# Patient Record
Sex: Male | Born: 1935 | Race: White | Hispanic: No | Marital: Married | Smoking: Former smoker
Health system: Southern US, Community
[De-identification: ages and names within clinical notes are randomized; demographics above are authoritative.]

## PROBLEM LIST (undated history)

## (undated) DIAGNOSIS — R141 Gas pain: Secondary | ICD-10-CM

## (undated) DIAGNOSIS — G4730 Sleep apnea, unspecified: Secondary | ICD-10-CM

## (undated) DIAGNOSIS — K921 Melena: Secondary | ICD-10-CM

## (undated) DIAGNOSIS — I10 Essential (primary) hypertension: Secondary | ICD-10-CM

## (undated) DIAGNOSIS — I2510 Atherosclerotic heart disease of native coronary artery without angina pectoris: Secondary | ICD-10-CM

## (undated) DIAGNOSIS — H9319 Tinnitus, unspecified ear: Secondary | ICD-10-CM

## (undated) DIAGNOSIS — R142 Eructation: Secondary | ICD-10-CM

## (undated) DIAGNOSIS — N182 Chronic kidney disease, stage 2 (mild): Secondary | ICD-10-CM

## (undated) DIAGNOSIS — D551 Anemia due to other disorders of glutathione metabolism: Secondary | ICD-10-CM

## (undated) DIAGNOSIS — T7840XA Allergy, unspecified, initial encounter: Secondary | ICD-10-CM

## (undated) DIAGNOSIS — R1310 Dysphagia, unspecified: Secondary | ICD-10-CM

## (undated) DIAGNOSIS — A159 Respiratory tuberculosis unspecified: Secondary | ICD-10-CM

## (undated) DIAGNOSIS — L02419 Cutaneous abscess of limb, unspecified: Secondary | ICD-10-CM

## (undated) DIAGNOSIS — D529 Folate deficiency anemia, unspecified: Secondary | ICD-10-CM

## (undated) DIAGNOSIS — K648 Other hemorrhoids: Secondary | ICD-10-CM

## (undated) DIAGNOSIS — K625 Hemorrhage of anus and rectum: Secondary | ICD-10-CM

## (undated) DIAGNOSIS — L259 Unspecified contact dermatitis, unspecified cause: Secondary | ICD-10-CM

## (undated) DIAGNOSIS — R634 Abnormal weight loss: Secondary | ICD-10-CM

## (undated) DIAGNOSIS — E871 Hypo-osmolality and hyponatremia: Secondary | ICD-10-CM

## (undated) DIAGNOSIS — L03119 Cellulitis of unspecified part of limb: Secondary | ICD-10-CM

## (undated) DIAGNOSIS — Z125 Encounter for screening for malignant neoplasm of prostate: Secondary | ICD-10-CM

## (undated) DIAGNOSIS — G2581 Restless legs syndrome: Secondary | ICD-10-CM

## (undated) DIAGNOSIS — N183 Chronic kidney disease, stage 3 (moderate): Secondary | ICD-10-CM

## (undated) DIAGNOSIS — F0390 Unspecified dementia without behavioral disturbance: Secondary | ICD-10-CM

## (undated) DIAGNOSIS — D696 Thrombocytopenia, unspecified: Secondary | ICD-10-CM

## (undated) DIAGNOSIS — IMO0001 Reserved for inherently not codable concepts without codable children: Secondary | ICD-10-CM

## (undated) DIAGNOSIS — R6889 Other general symptoms and signs: Secondary | ICD-10-CM

## (undated) DIAGNOSIS — B354 Tinea corporis: Secondary | ICD-10-CM

## (undated) DIAGNOSIS — E785 Hyperlipidemia, unspecified: Secondary | ICD-10-CM

## (undated) DIAGNOSIS — R609 Edema, unspecified: Secondary | ICD-10-CM

## (undated) DIAGNOSIS — E669 Obesity, unspecified: Secondary | ICD-10-CM

## (undated) DIAGNOSIS — N289 Disorder of kidney and ureter, unspecified: Secondary | ICD-10-CM

## (undated) DIAGNOSIS — K5790 Diverticulosis of intestine, part unspecified, without perforation or abscess without bleeding: Secondary | ICD-10-CM

## (undated) DIAGNOSIS — E039 Hypothyroidism, unspecified: Secondary | ICD-10-CM

## (undated) DIAGNOSIS — I509 Heart failure, unspecified: Secondary | ICD-10-CM

## (undated) DIAGNOSIS — E559 Vitamin D deficiency, unspecified: Secondary | ICD-10-CM

## (undated) DIAGNOSIS — R143 Flatulence: Secondary | ICD-10-CM

## (undated) DIAGNOSIS — R252 Cramp and spasm: Secondary | ICD-10-CM

## (undated) DIAGNOSIS — K265 Chronic or unspecified duodenal ulcer with perforation: Secondary | ICD-10-CM

## (undated) DIAGNOSIS — J189 Pneumonia, unspecified organism: Secondary | ICD-10-CM

## (undated) DIAGNOSIS — D649 Anemia, unspecified: Secondary | ICD-10-CM

## (undated) DIAGNOSIS — R413 Other amnesia: Secondary | ICD-10-CM

## (undated) DIAGNOSIS — R197 Diarrhea, unspecified: Secondary | ICD-10-CM

## (undated) DIAGNOSIS — Z9181 History of falling: Secondary | ICD-10-CM

## (undated) DIAGNOSIS — R0602 Shortness of breath: Secondary | ICD-10-CM

## (undated) DIAGNOSIS — Z5189 Encounter for other specified aftercare: Secondary | ICD-10-CM

## (undated) HISTORY — DX: Abnormal weight loss: R63.4

## (undated) HISTORY — DX: Chronic kidney disease, stage 3 (moderate): N18.3

## (undated) HISTORY — DX: Edema, unspecified: R60.9

## (undated) HISTORY — PX: EYE SURGERY: SHX253

## (undated) HISTORY — DX: Respiratory tuberculosis unspecified: A15.9

## (undated) HISTORY — DX: Disorder of kidney and ureter, unspecified: N28.9

## (undated) HISTORY — DX: Hemorrhage of anus and rectum: K62.5

## (undated) HISTORY — DX: Other amnesia: R41.3

## (undated) HISTORY — DX: Other general symptoms and signs: R68.89

## (undated) HISTORY — DX: Tinea corporis: B35.4

## (undated) HISTORY — PX: CORONARY ARTERY BYPASS GRAFT: SHX141

## (undated) HISTORY — DX: Eructation: R14.2

## (undated) HISTORY — DX: Pneumonia, unspecified organism: J18.9

## (undated) HISTORY — DX: Encounter for screening for malignant neoplasm of prostate: Z12.5

## (undated) HISTORY — DX: Anemia due to other disorders of glutathione metabolism: D55.1

## (undated) HISTORY — DX: History of falling: Z91.81

## (undated) HISTORY — DX: Shortness of breath: R06.02

## (undated) HISTORY — DX: Cramp and spasm: R25.2

## (undated) HISTORY — DX: Diverticulosis of intestine, part unspecified, without perforation or abscess without bleeding: K57.90

## (undated) HISTORY — DX: Chronic or unspecified duodenal ulcer with perforation: K26.5

## (undated) HISTORY — DX: Anemia, unspecified: D64.9

## (undated) HISTORY — DX: Melena: K92.1

## (undated) HISTORY — DX: Hypothyroidism, unspecified: E03.9

## (undated) HISTORY — DX: Obesity, unspecified: E66.9

## (undated) HISTORY — DX: Gas pain: R14.1

## (undated) HISTORY — DX: Encounter for other specified aftercare: Z51.89

## (undated) HISTORY — DX: Unspecified contact dermatitis, unspecified cause: L25.9

## (undated) HISTORY — PX: JEJUNOSTOMY FEEDING TUBE: SUR737

## (undated) HISTORY — DX: Flatulence: R14.3

## (undated) HISTORY — DX: Hypo-osmolality and hyponatremia: E87.1

## (undated) HISTORY — DX: Folate deficiency anemia, unspecified: D52.9

## (undated) HISTORY — DX: Thrombocytopenia, unspecified: D69.6

## (undated) HISTORY — DX: Tinnitus, unspecified ear: H93.19

## (undated) HISTORY — DX: Dysphagia, unspecified: R13.10

## (undated) HISTORY — DX: Unspecified dementia without behavioral disturbance: F03.90

## (undated) HISTORY — DX: Chronic kidney disease, stage 2 (mild): N18.2

## (undated) HISTORY — DX: Vitamin D deficiency, unspecified: E55.9

## (undated) HISTORY — DX: Reserved for inherently not codable concepts without codable children: IMO0001

## (undated) HISTORY — DX: Diarrhea, unspecified: R19.7

## (undated) HISTORY — DX: Allergy, unspecified, initial encounter: T78.40XA

## (undated) HISTORY — DX: Atherosclerotic heart disease of native coronary artery without angina pectoris: I25.10

## (undated) HISTORY — DX: Other hemorrhoids: K64.8

---
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
## 2000-02-09 ENCOUNTER — Ambulatory Visit (HOSPITAL_COMMUNITY): Admission: RE | Admit: 2000-02-09 | Discharge: 2000-02-09 | Payer: Self-pay | Admitting: Cardiovascular Disease

## 2000-02-15 ENCOUNTER — Encounter: Payer: Self-pay | Admitting: Thoracic Surgery (Cardiothoracic Vascular Surgery)

## 2000-02-19 ENCOUNTER — Encounter: Payer: Self-pay | Admitting: Thoracic Surgery (Cardiothoracic Vascular Surgery)

## 2000-02-19 ENCOUNTER — Inpatient Hospital Stay (HOSPITAL_COMMUNITY)
Admission: RE | Admit: 2000-02-19 | Discharge: 2000-02-23 | Payer: Self-pay | Admitting: Thoracic Surgery (Cardiothoracic Vascular Surgery)

## 2000-02-20 ENCOUNTER — Encounter: Payer: Self-pay | Admitting: Thoracic Surgery (Cardiothoracic Vascular Surgery)

## 2000-02-21 ENCOUNTER — Encounter: Payer: Self-pay | Admitting: Thoracic Surgery (Cardiothoracic Vascular Surgery)

## 2000-02-21 ENCOUNTER — Encounter: Payer: Self-pay | Admitting: Cardiothoracic Surgery

## 2003-08-13 DIAGNOSIS — R413 Other amnesia: Secondary | ICD-10-CM

## 2003-08-13 HISTORY — DX: Other amnesia: R41.3

## 2005-05-30 ENCOUNTER — Encounter: Admission: RE | Admit: 2005-05-30 | Discharge: 2005-06-07 | Payer: Self-pay | Admitting: Surgery

## 2007-06-23 DIAGNOSIS — H9319 Tinnitus, unspecified ear: Secondary | ICD-10-CM

## 2007-06-23 DIAGNOSIS — R0602 Shortness of breath: Secondary | ICD-10-CM

## 2007-06-23 DIAGNOSIS — R141 Gas pain: Secondary | ICD-10-CM

## 2007-06-23 DIAGNOSIS — E669 Obesity, unspecified: Secondary | ICD-10-CM

## 2007-06-23 DIAGNOSIS — R6889 Other general symptoms and signs: Secondary | ICD-10-CM

## 2007-06-23 DIAGNOSIS — B354 Tinea corporis: Secondary | ICD-10-CM

## 2007-06-23 DIAGNOSIS — R609 Edema, unspecified: Secondary | ICD-10-CM

## 2007-06-23 HISTORY — DX: Tinnitus, unspecified ear: H93.19

## 2007-06-23 HISTORY — DX: Edema, unspecified: R60.9

## 2007-06-23 HISTORY — DX: Obesity, unspecified: E66.9

## 2007-06-23 HISTORY — DX: Flatulence: R14.1

## 2007-06-23 HISTORY — DX: Tinea corporis: B35.4

## 2007-06-23 HISTORY — DX: Other general symptoms and signs: R68.89

## 2007-06-23 HISTORY — DX: Shortness of breath: R06.02

## 2008-11-11 ENCOUNTER — Encounter: Payer: Self-pay | Admitting: Gastroenterology

## 2008-11-18 ENCOUNTER — Encounter: Payer: Self-pay | Admitting: Gastroenterology

## 2008-11-18 DIAGNOSIS — K921 Melena: Secondary | ICD-10-CM

## 2008-11-18 HISTORY — DX: Melena: K92.1

## 2008-12-13 ENCOUNTER — Ambulatory Visit: Payer: Self-pay | Admitting: Gastroenterology

## 2008-12-13 DIAGNOSIS — D649 Anemia, unspecified: Secondary | ICD-10-CM

## 2008-12-13 DIAGNOSIS — K921 Melena: Secondary | ICD-10-CM

## 2008-12-24 ENCOUNTER — Ambulatory Visit: Payer: Self-pay | Admitting: Gastroenterology

## 2009-02-24 DIAGNOSIS — E559 Vitamin D deficiency, unspecified: Secondary | ICD-10-CM

## 2009-02-24 HISTORY — DX: Vitamin D deficiency, unspecified: E55.9

## 2009-03-24 IMAGING — CR CXR
1 series · 2 of 2 positions shown · non-contrast
Comparison: NONE

CLINICAL DATA: Shortness of breath. Cough. History of cardiac 
bypass  surgery. 

CHEST TWO VIEW (PA AND LATERAL)

[Series 1: view not recorded · 0.17mm/px · 2 of 2 slices shown]
[im 1/2]
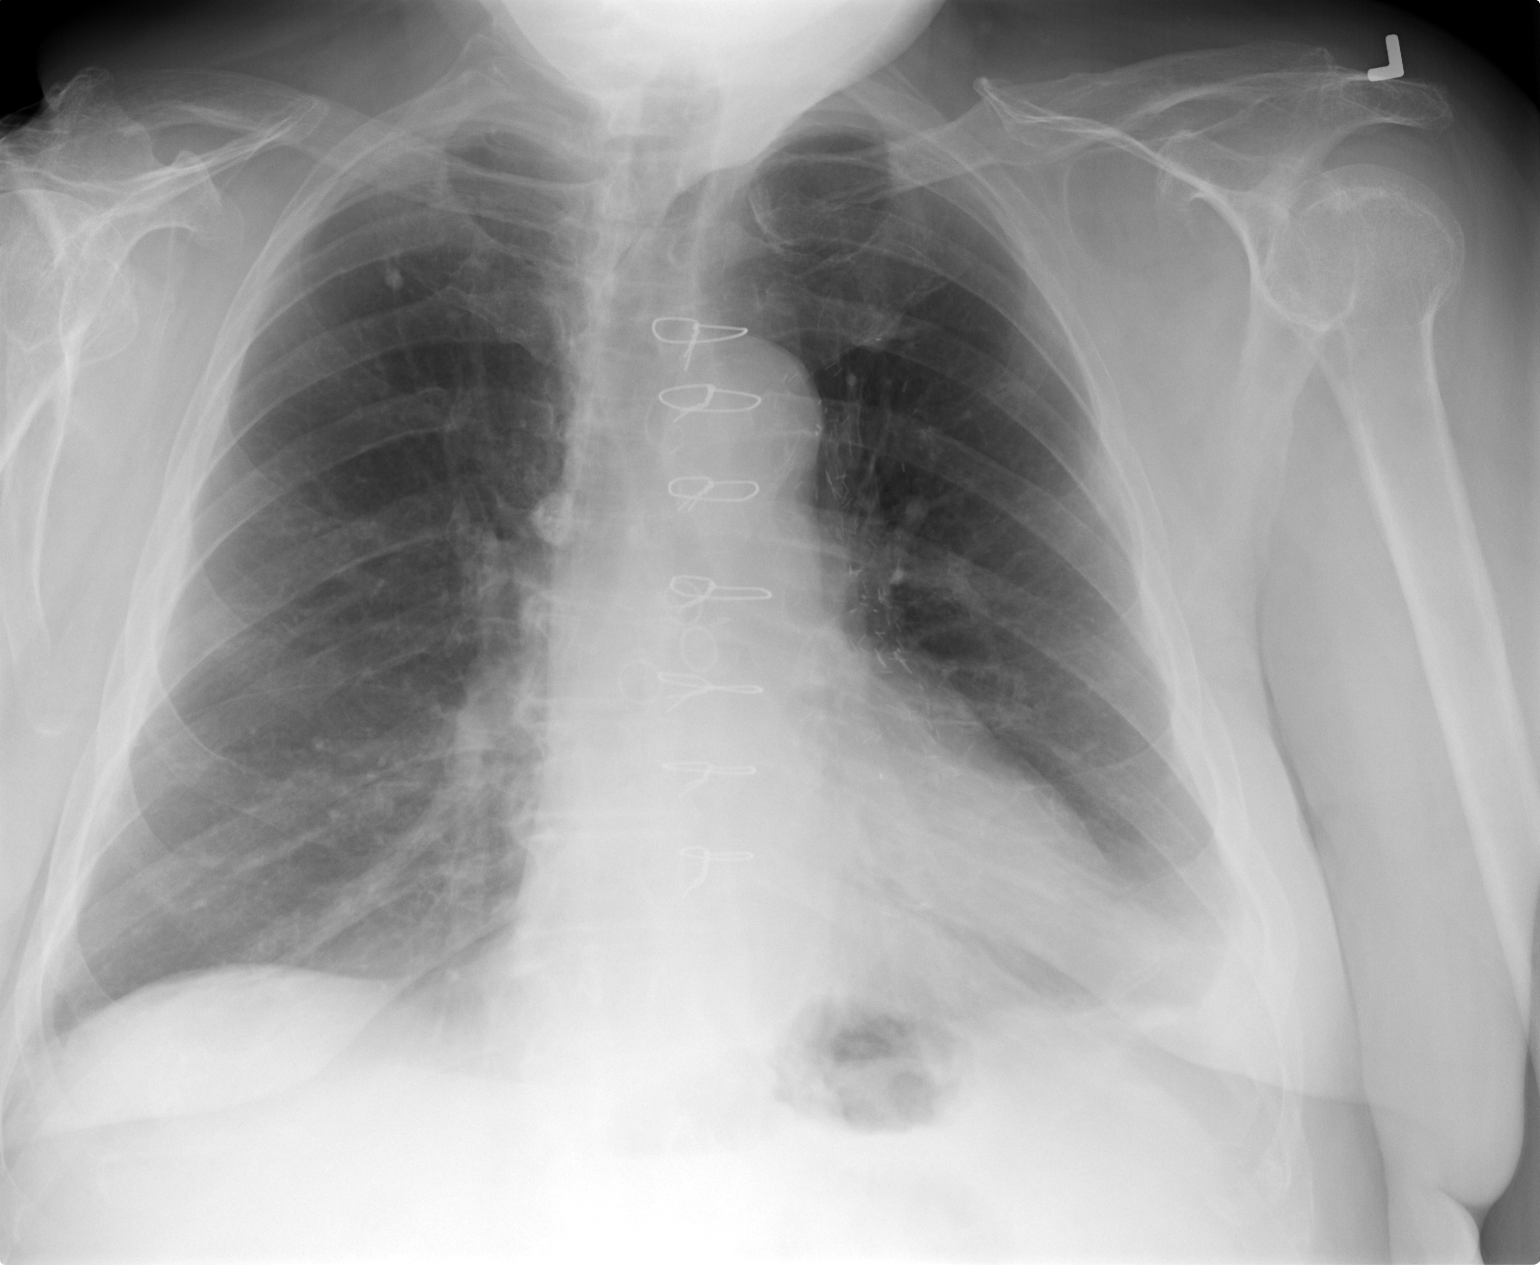
[im 2/2]
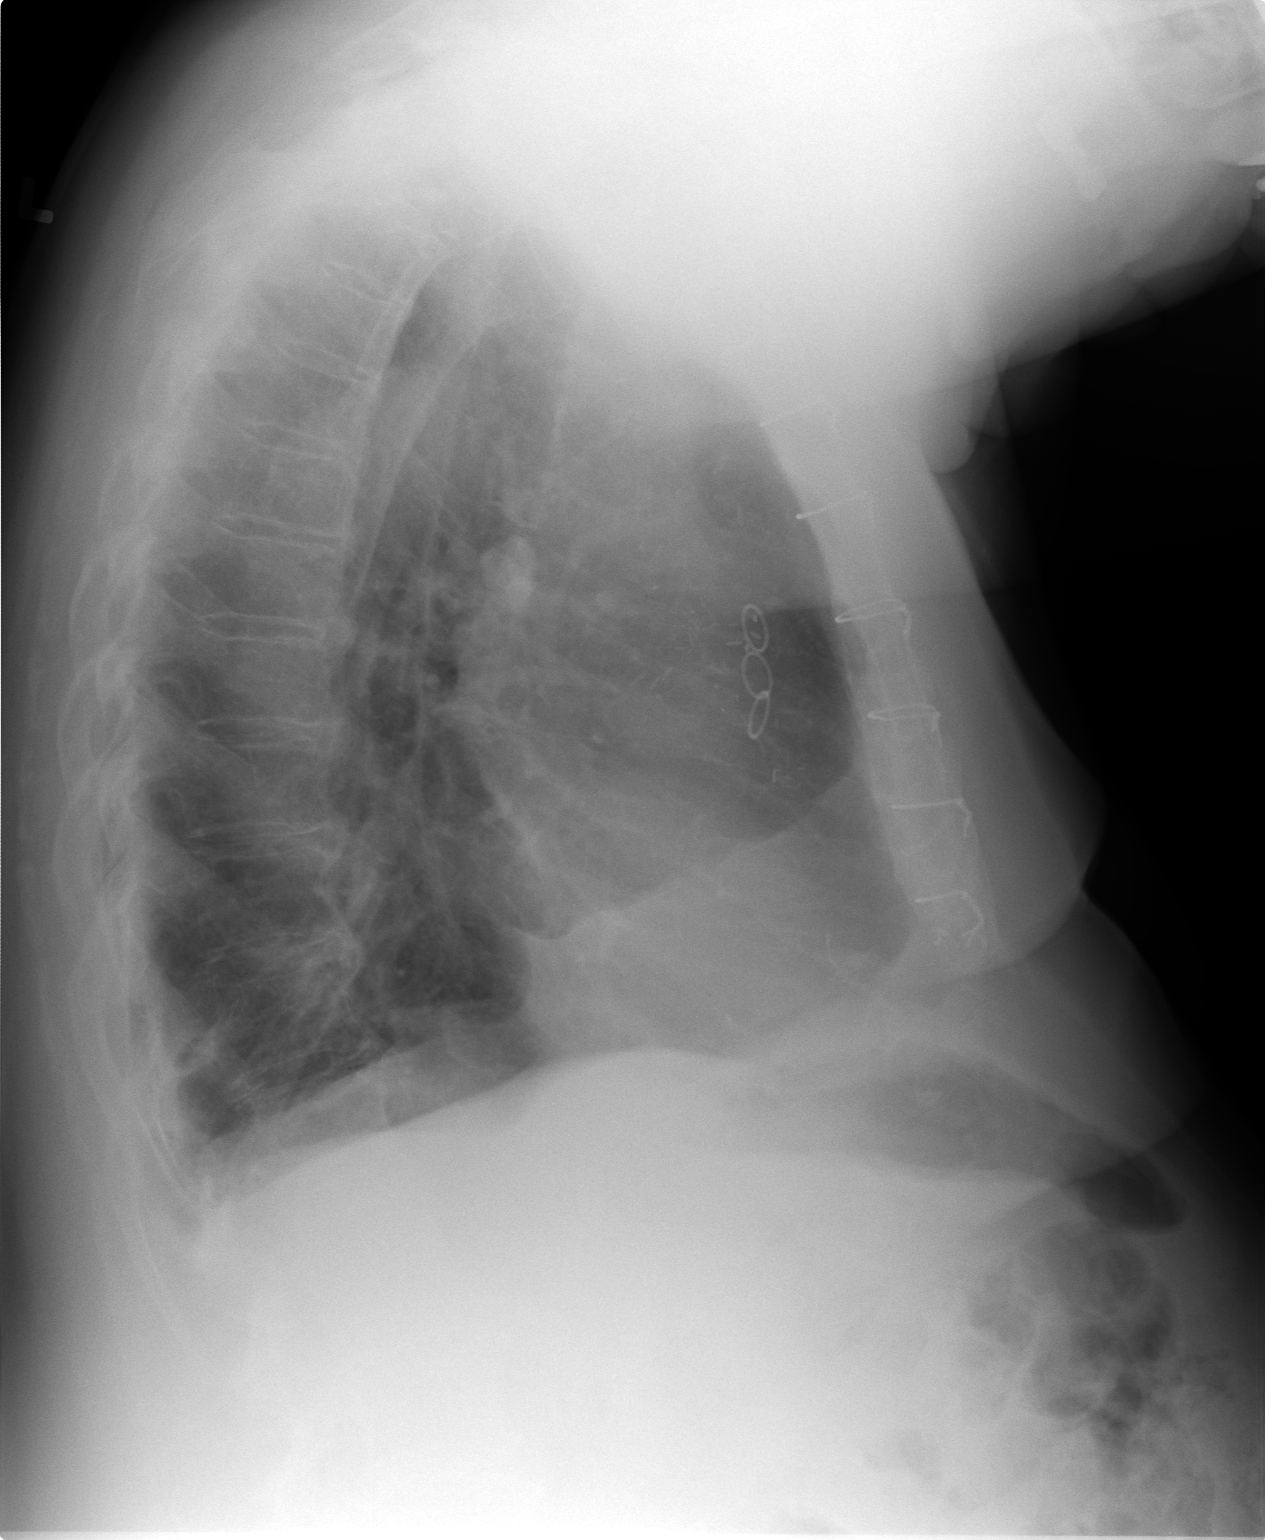

[2 of 2 positions shown; findings below may reference images not displayed]

FINDINGS: Sternal sutures are present. The heart is upper normal 
for size. There is blunting of the left costophrenic angle. 
Without prior films, I cannot determine if this acute or chronic. 
Calcified right azygous lymph node and small calcified granuloma 
present in the right upper lobe is consistent with prior 
granulomatous disease. There are no infiltrates. Osteopenia and 
mild thoracic spondylosis is noted. There is mild hyperinflation 
present.
IMPRESSION: Mild hyperinflation. Blunting of the left 
costophrenic angle suggestive of either pleural effusion or 
pleural thickening. I recommend obtaining prior films for 
comparison. If priors cannot be obtained, I recommend a follow-up 
study in 2-4 weeks. Evidence for old granulomatous disease. Larry Jose 
Rastio Forevar, M.D. electronically reviewed on 12/25/2007 Dict Date: 
12/25/2007  Tran Date: 12/25/2007 CAV  [REDACTED]

## 2009-05-05 DIAGNOSIS — Z9181 History of falling: Secondary | ICD-10-CM

## 2009-05-05 HISTORY — DX: History of falling: Z91.81

## 2010-08-10 DIAGNOSIS — R252 Cramp and spasm: Secondary | ICD-10-CM

## 2010-08-10 HISTORY — DX: Cramp and spasm: R25.2

## 2010-11-07 LAB — GLUCOSE, CAPILLARY
Glucose-Capillary: 106 mg/dL — ABNORMAL HIGH (ref 70–99)
Glucose-Capillary: 108 mg/dL — ABNORMAL HIGH (ref 70–99)

## 2010-12-15 NOTE — Op Note (Signed)
[place]. [facility]  Patient:    [name] [name], [name] [name]                     MRN: [id number] Proc. Date: 02/19/00 Adm. Date:  [id number] Attending:  Charlett Lango                           Operative Report  PREOPERATIVE DIAGNOSIS:  Severe three-vessel coronary artery disease with impaired left ventricular function.  POSTOPERATIVE DIAGNOSIS:  Severe three-vessel coronary artery disease with impaired left ventricular function.  PROCEDURE:  Median sternotomy, extracorporeal circulation, coronary artery bypass grafting times five, (left internal mammary artery to left anterior descending, saphenous vein graft to first diagonal, sequential saphenous vein grafts to OM 2 and 3, saphenous vein graft to posterior descending).  SURGEON:  Salvatore Decent. Dorris Fetch, M.D.  ASSISTANT:  Nada Boozer.  ANESTHESIA:  General.  FINDINGS:  OM-1 too small to graft, remaining coronaries good targets, conduits of good quality, left ventricular hypertrophy and dilatation.  CLINICAL NOTE:  [name] is a 77 year old gentleman who presented with stable angina.  He had a positive Cardiolite and underwent cardiac catheterization which revealed severe three-vessel disease with a co-dominant circulation and impaired left ventricular function with an ejection fraction of 45%.  The patient was referred for coronary artery bypass grafting.  The indications, risks, benefits and alternatives were discussed in detail with the patient.  He had an opportunity to ask questions.  He accepted the risks and agreed to proceed.  PROCEDURE IN DETAIL:  [name] was brought to the preop holding area on February 19, 2000.  Lines were placed to monitor arterial, central venous and pulmonary arterial pressure.  EKG leads were placed for continuous telemetry.  The patient was taken to the operating room, anesthetized and intubated.  A Foley catheter was placed.  Intravenous antibiotics were  administered.  The chest, abdomen and legs were prepped and draped in the usual fashion.  A median sternotomy was performed.  Simultaneously incision was made in the medial aspect of the right leg, and the greater saphenous vein was harvested from the ankle to the mid thigh.  The left internal mammary artery was harvested in a standard fashion.  Large branches were doubly clipped and divided.  Small branches were clipped on the mammary side and divided with cautery on the chest wall side.  The patient was fully heparinized prior to dividing the distal end of the mammary artery.  There was good flow to the cut end of the vessel.  The mammary was placed in a papaverine soaked sponge after insuring that all side branches had been clipped.  It was placed into the left pleural space.  The pericardium was opened.  The ascending aorta was inspected and palpated. The aorta was normal size with no palpable atherosclerotic disease.  The aorta was cannulated via concentric 2-0 Ethibond non-pledgeted purse-string sutures.  A dual stage venous cannula was placed via purse-string suture in the right atrial appendage.  Cardiopulmonary bypass was instituted and the patient was cooled to 32 degree Celsius.  The coronary arteries were inspected and anastomotic sites were chosen.  The conduits were inspect and cut to length.  A foam pad was placed in the pericardium to protect the left phrenic nerve.  A temperature probe was placed in the myocardial septum and a cardioplegic cannula was placed in the ascending aorta.  The aorta was cross clamped.  The left  ventricle was intubated via the aortic root.  Cardiac arrest was achieved with a combination of cold antegrade blood cardioplegia and topical iced saline.  800 cc of cardioplegia was administered.  Myocardial septal temperature was 12 degrees Celsius.  The following distal anastomoses then were performed:  First, a reverse saphenous vein graft was  placed end-to-side to the posterior descending branch of the right coronary artery.  He had a co-dominant circulation, but the main posterior descending branch came off the right.  The posterolateral circulation came off the left.  The posterior descending from the right was a 1.5 mm good quality target.  The anastomosis was performed end-to-side with a running 7-0 Prolene suture.  The vein graft was of good quality.  At the completion of the anastomosis, cardioplegia was administered down the vein graft.  There was good flow through the graft and there was good hemostasis.  Next, a reverse saphenous vein graft was placed end-to-side to the first diagonal branch of the LAD.  This was a heavily diseased vessel proximally but good quality target at the site of the anastomosis.  The anastomosis was performed with a running 7-0 Prolene suture, was probed proximally and distally at the completion of the anastomosis to insure patency.  There was good flow through the graft.  Additional cardioplegia was administered down the vein graft.  There was good hemostasis.  Next, a reverse saphenous vein graft was placed sequentially to the second and third obtuse marginal branches of the left circumflex coronary artery.  OM-1 had a tight ostial stenosis but was too small to graft.  OM-2 and 3 were good quality targets.  OM-2 had a 99% stenosis at its origin.  A side-to-side anastomosis was performed of this vessel using a running 7-0 Prolene suture off a side branch of the vein graft.  There was good flow through this anastomosis.  An end-to-side anastomosis then was performed to obtuse marginal number three, which was 1.5 mm good quality vessel.  It supplied the distal posterolateral circulation.  The anastomosis was completed.  The vein graft had excellent flow.  Cardioplegia was administered.  Additional cardioplegia was also administered down the aortic root.  Next, the left internal mammary  artery was brought through a window in the pericardium.  The distal end was spatulated and was anastomosed end-to-side to the distal left anterior descending.  The left anterior descending was a 1.5  mm good quality target.  The mammary was also of good quality.  The anastomosis was difficult because of back-bleeding from the mammary artery which the aortic root vent did not lessen.  The anastomosis was performed with a running 8-0 Prolene suture.  At the completion of the anastomosis, the clamp was removed from the mammary artery.  Immediate septal rewarming was noted. Hemostasis was good.  The mammary pedicle was tacked to the epicardial surface of the heart with interrupted 6-0 Prolene sutures.  Lidocaine was administered and the aortic cross clamp was removed.  Total cross clamp time was 58 minutes.  The vein grafts were cut to length.  A partial occlusion clamp was placed on the ascending aorta.  The cardioplegic cannula was removed.  The proximal vein graft anastomoses were performed with 4.0 mm punch aortotomies with running 6-0 Prolene sutures.  At the completion of the final proximal anastomosis, the patient was placed in Trendelenburg position.  Air was allowed to vent as the partial clamp was removed prior to tying the final suture.  All proximal and distal  anastomoses were inspected for hemostasis.  The patient was rewarmed.  Epicardial pacing wires were placed on the right ventricle and right atrium.  The patient was weaned from cardiopulmonary bypass with the cor temperature reached 37 degrees Celsius.  The patient was in sinus rhythm at the time of separation from bypass.  A single defibrillation with 20 joules had been required after removing the cross clamp.  The patient also was on inotropic support at the time of separation from bypass.  The lungs were inflated.  The heart was gradually filled with blood and the pump flow was reduced.  The patient weaned without  difficulty on the first attempt.  The initial cardiac index was greater than 2 liters per minute.  Total bypass time was 116 minutes.  A test dose of Protamine was administered and was well tolerated.  The atrial and aortic cannulae were removed.  The remainder of Protamine was administered without incident.  An additional dose of antibiotics was given.  The chest was irrigated with one liter of warm normal saline containing 1 gram of Vancomycin.  Hemostasis was achieved.  A left pleural and two mediastinal chest tubes were placed through separate subcostal incisions.  The mediastinal fat was reapproximated over the heart to prevent adhesions to the sternum with interrupted 3-0 silk sutures.  The sternum was closed with interrupted heavy gauge stainless steel wires.  The pectoralis fascia was closed with a running #1 Vicryl suture.  Subcutaneous tissue was closed with a running 2-0 Vicryl suture, both the chest and the leg.  The skin was closed with a 3-0 Vicryl subcuticular suture.  All sponge, needle and instrument counts were correct at the end of the procedure.  There were no intraoperative complications.  The patient was taken from the operating room to the surgical intensive care unit, intubated in stable condition.DD:  02/19/00 TD:  02/20/00 Job: [id number]

## 2010-12-15 NOTE — Cardiovascular Report (Signed)
[place]. [facility]  Patient:    [name] [name], [name] [name]                     MRN: [id number] Proc. Date: 02/09/00 Adm. Date:  [id number] Attending:  Koren Bound CC:         Austin Miles. Asencion Islam, M.D.             CVTS             Cardiac Catheterization Laboratory                        Cardiac Catheterization  INDICATIONS:  [name] is a 77 year old gentleman with a recent diagonal of diabetes mellitus.  He also has a history of obesity.  He was referred for heart catheterization after having an early positive treadmill test.  He had originally presented to Dr. Asencion Islam with severe dyspnea with minimal exertion.  PROCEDURES:  Left heart catheterization with coronary angiography.  DESCRIPTION OF PROCEDURE:  The right femoral artery was easily cannulated using a modified Seldinger technique.  HEMODYNAMICS:  The left ventricular pressure was 100/15 with an aortic pressure of 99/52.  ANGIOGRAPHY:  The left main coronary artery is large and is otherwise normal.  The left anterior descending artery is subtotally occluded in the proximal segment.  There is very sluggish antegrade flow.  There are several diagonal branches which are visible.  The left circumflex artery is a very large vessel and is codominant.  The proximal aspect of this vessel is normal.  There is a 50-60% mid stenosis. The distal circumflex artery has mild to moderate irregularities but no critical stenoses.  The first obtuse marginal artery is a moderate sized vessel with a 90% stenosis proximally.  The second obtuse marginal artery is a fairly large vessel with a 95% proximal stenosis.  The third obtuse marginal is a fairly large vessel with a 40-50% stenosis proximally.  The posterolateral segment arteries have only minor luminal irregularities.  The right coronary artery is a moderate sized vessel and is codominant.  There is a 95% stenosis in the proximal aspect of this vessel.   There is very sluggish TIMI grade II TIMI grade flow.  The posterior descending artery has minor luminal irregularities.  LEFT VENTRICULOGRAM:  The left ventriculogram was performed in a 30 RAO position.  There is severe hypokinesis of the anterior and anterior apical wall.  The lateral wall and the inferior wall contract fairly normally and in fact the left ventricular systolic function is within normal limits with an EF of 61%.  There is no significant mitral regurgitation on the normal sinus rhythm beats.  The aortic root appears to be normal size.  COMPLICATIONS:  None.  CONCLUSIONS: 1. Severe three-vessel coronary artery disease. 2. Well-preserved left ventricular systolic function.  We will refer him to CVTS for consideration for coronary artery bypass grafting. DD:  02/09/00 TD:  02/09/00 Job: [id number]

## 2010-12-15 NOTE — Discharge Summary (Signed)
[place]. [facility]  Patient:    [name] [name], [name] [name]                     MRN: [id number] Adm. Date:  [id number] Disc. Date: [id number] Attending:  Charlett Lango Dictator:   Lissa Merlin, P.A.-C. CC:         CVTS Office             Alvia Grove., M.D.             Austin Miles. Asencion Islam, M.D.                           Discharge Summary  DATE OF BIRTH:  April 27, 1920  SURGEON:  Charlett Lango, M.D.  CARDIOLOGIST:  Elyn Aquas., M.D.  PRIMARY CARE PHYSICIAN:  Dr. Asencion Islam  ADMISSION DIAGNOSES: 1. Class III angina. 2. Recent positive stress test. 3. Recent cardiac catheterization showed three-vessel coronary disease with    ejection fraction of 50%.  PAST MEDICAL HISTORY: 1. No previous history of CAD. 2. Hypertension. 3. Recently diagnosed adult onset diabetes, no treatment yet. 4. Remote history of TB. 5. Cataract surgery. 6. Remote osteomyelitis. 7. Hypercholesterolemia.  DISCHARGE DIAGNOSES:  Status post CABG x 5 on February 19, 2000, with the following grafts:  LIMA to the LAD, sequential saphenous vein graft from OM-2 to OM-3, saphenous vein graft to PD, saphenous vein graft to first diagonal.  BRIEF HISTORY:  Mr. Corrales is a pleasant 77 year old white male who originally complained of a two-month history of tightness in his upper chest with exertion.  He was normally very active walking at least a mile a day but he noticed that he began to experience discomfort after one-half mile.  He also had shortness of breath associated with that.  He had no nausea, vomiting or diaphoresis.  He went to his primary physician, Dr. Asencion Islam and was subsequently referred to Dr. Elease Hashimoto.  He then had a positive stress test and subsequently underwent cardiac catheterization which showed severe three-vessel coronary disease and EF of 50%.  CVTS consultation was ordered, and Mr. Hosea saw Dr. Dorris Fetch at the CVTS office on February 14, 2000.  After  seeing Mr. Mogan and reviewing the available data, Dr. Dorris Fetch concluded CABG was the best treatment alternative.  Risks, benefits, details, and alternatives of surgery were discussed with Mr. Knittle and it was agreed to proceed.  Elective CABG was scheduled for Monday, February 19, 2000.  HOSPITAL COURSE:  Mr. Sunde was admitted for the elected procedure on February 19, 2000.  He underwent the procedure without obvious complications and was transferred to SICU in stable condition.  Postop, he made quick progress and was transferred to unit 2000 on postop day #1.  His postop course has been uneventful and he has been making quick progress on unit 2000.   He is currently stable and his vital signs are stable, was afebrile, his saturation is good.  He is almost down to his preoperative weight.  He is in sinus rhythm.  He is working with cardiac rehabilitation, doing quite well.  He is taking p.o.s well.  His bowel and bladder are well.  It is anticipated he will be discharged home pending satisfactory morning rounds on Friday, February 23, 2000.  MEDICATIONS: 1. Lopressor 50 mg one-half tablet p.o. q.12h. 2. Enteric-coated aspirin 325 mg one p.o. q.d. 3. Darvocet-N 100 one or two p.o. q.4-6h. p.r.n. for pain.  ALLERGIES:  No known drug allergies.  SPECIAL INSTRUCTIONS:  Mr. Saadeh is instructed to avoid driving, no strenuous activity, no lifting more than 10 pounds.  He is to walk daily.  He can shower.  He is to use his breathing exerciser daily.  He is to maintain low cholesterol, low sweets and sugar diet.  He is to keep his incisions clean and dry and to use soap and water only, and to call the office if he notices any redness, swelling, or drainage or fever.  He is told to get a chest x-ray when he sees Dr. Elease Hashimoto and to bring it with him when he sees Dr. Dorris Fetch.  FOLLOWUP: 1. He will see Dr. Elease Hashimoto in around two weeks. 2. He will see Dr. Dorris Fetch, Wednesday, August 22 at  11:45 a.m. 3. He has diabetes education arranged by the diabetes coordinator and will    go the appropriate followup.  He has been provided a Glucometer to check    his sugars at home. DD:  02/22/00 TD:  02/25/00 Job: [id number]

## 2011-03-22 DIAGNOSIS — N183 Chronic kidney disease, stage 3 unspecified: Secondary | ICD-10-CM

## 2011-03-22 DIAGNOSIS — N182 Chronic kidney disease, stage 2 (mild): Secondary | ICD-10-CM

## 2011-03-22 HISTORY — DX: Chronic kidney disease, stage 3 unspecified: N18.30

## 2011-03-22 HISTORY — DX: Chronic kidney disease, stage 2 (mild): N18.2

## 2011-06-28 DIAGNOSIS — R1310 Dysphagia, unspecified: Secondary | ICD-10-CM

## 2011-06-28 HISTORY — DX: Dysphagia, unspecified: R13.10

## 2011-09-11 ENCOUNTER — Encounter: Payer: Self-pay | Admitting: Gastroenterology

## 2011-09-11 DIAGNOSIS — R634 Abnormal weight loss: Secondary | ICD-10-CM

## 2011-09-11 HISTORY — DX: Abnormal weight loss: R63.4

## 2011-09-12 ENCOUNTER — Emergency Department (HOSPITAL_COMMUNITY): Payer: Medicare Other

## 2011-09-12 ENCOUNTER — Inpatient Hospital Stay (HOSPITAL_COMMUNITY): Payer: Medicare Other

## 2011-09-12 ENCOUNTER — Emergency Department (HOSPITAL_COMMUNITY): Payer: Medicare Other | Admitting: Anesthesiology

## 2011-09-12 ENCOUNTER — Encounter (HOSPITAL_COMMUNITY): Admission: EM | Disposition: A | Discharge status: Skilled Nursing Facility | Payer: Self-pay | Source: Home / Self Care

## 2011-09-12 ENCOUNTER — Inpatient Hospital Stay (HOSPITAL_COMMUNITY)
Admission: EM | Admit: 2011-09-12 | Discharge: 2011-09-24 | DRG: 326 | Disposition: A | Discharge status: Skilled Nursing Facility | Payer: Medicare Other | Attending: General Surgery | Admitting: General Surgery

## 2011-09-12 ENCOUNTER — Other Ambulatory Visit: Payer: Self-pay

## 2011-09-12 ENCOUNTER — Encounter (HOSPITAL_COMMUNITY): Payer: Self-pay | Admitting: Anesthesiology

## 2011-09-12 ENCOUNTER — Encounter (HOSPITAL_COMMUNITY): Payer: Self-pay | Admitting: *Deleted

## 2011-09-12 DIAGNOSIS — N189 Chronic kidney disease, unspecified: Secondary | ICD-10-CM

## 2011-09-12 DIAGNOSIS — K668 Other specified disorders of peritoneum: Secondary | ICD-10-CM | POA: Diagnosis present

## 2011-09-12 DIAGNOSIS — K265 Chronic or unspecified duodenal ulcer with perforation: Principal | ICD-10-CM | POA: Diagnosis present

## 2011-09-12 DIAGNOSIS — R112 Nausea with vomiting, unspecified: Secondary | ICD-10-CM | POA: Diagnosis present

## 2011-09-12 DIAGNOSIS — I129 Hypertensive chronic kidney disease with stage 1 through stage 4 chronic kidney disease, or unspecified chronic kidney disease: Secondary | ICD-10-CM | POA: Diagnosis present

## 2011-09-12 DIAGNOSIS — Z951 Presence of aortocoronary bypass graft: Secondary | ICD-10-CM

## 2011-09-12 DIAGNOSIS — I5033 Acute on chronic diastolic (congestive) heart failure: Secondary | ICD-10-CM | POA: Diagnosis not present

## 2011-09-12 DIAGNOSIS — I509 Heart failure, unspecified: Secondary | ICD-10-CM | POA: Diagnosis present

## 2011-09-12 DIAGNOSIS — E1122 Type 2 diabetes mellitus with diabetic chronic kidney disease: Secondary | ICD-10-CM

## 2011-09-12 DIAGNOSIS — I2510 Atherosclerotic heart disease of native coronary artery without angina pectoris: Secondary | ICD-10-CM | POA: Diagnosis present

## 2011-09-12 DIAGNOSIS — K251 Acute gastric ulcer with perforation: Secondary | ICD-10-CM

## 2011-09-12 DIAGNOSIS — N183 Chronic kidney disease, stage 3 unspecified: Secondary | ICD-10-CM | POA: Diagnosis present

## 2011-09-12 DIAGNOSIS — K269 Duodenal ulcer, unspecified as acute or chronic, without hemorrhage or perforation: Secondary | ICD-10-CM

## 2011-09-12 DIAGNOSIS — E785 Hyperlipidemia, unspecified: Secondary | ICD-10-CM | POA: Diagnosis present

## 2011-09-12 DIAGNOSIS — D696 Thrombocytopenia, unspecified: Secondary | ICD-10-CM | POA: Diagnosis not present

## 2011-09-12 DIAGNOSIS — R5381 Other malaise: Secondary | ICD-10-CM | POA: Diagnosis present

## 2011-09-12 DIAGNOSIS — E039 Hypothyroidism, unspecified: Secondary | ICD-10-CM | POA: Diagnosis present

## 2011-09-12 DIAGNOSIS — J9690 Respiratory failure, unspecified, unspecified whether with hypoxia or hypercapnia: Secondary | ICD-10-CM

## 2011-09-12 DIAGNOSIS — R578 Other shock: Secondary | ICD-10-CM | POA: Diagnosis present

## 2011-09-12 DIAGNOSIS — E46 Unspecified protein-calorie malnutrition: Secondary | ICD-10-CM | POA: Diagnosis present

## 2011-09-12 DIAGNOSIS — G4730 Sleep apnea, unspecified: Secondary | ICD-10-CM | POA: Diagnosis present

## 2011-09-12 DIAGNOSIS — D62 Acute posthemorrhagic anemia: Secondary | ICD-10-CM | POA: Diagnosis not present

## 2011-09-12 DIAGNOSIS — R579 Shock, unspecified: Secondary | ICD-10-CM

## 2011-09-12 DIAGNOSIS — J9600 Acute respiratory failure, unspecified whether with hypoxia or hypercapnia: Secondary | ICD-10-CM | POA: Diagnosis not present

## 2011-09-12 HISTORY — DX: Hyperlipidemia, unspecified: E78.5

## 2011-09-12 HISTORY — DX: Essential (primary) hypertension: I10

## 2011-09-12 HISTORY — DX: Obesity, unspecified: E66.9

## 2011-09-12 HISTORY — PX: GASTROSTOMY: SHX5249

## 2011-09-12 HISTORY — DX: Heart failure, unspecified: I50.9

## 2011-09-12 HISTORY — DX: Cellulitis of unspecified part of limb: L03.119

## 2011-09-12 HISTORY — PX: LAPAROTOMY: SHX154

## 2011-09-12 HISTORY — DX: Restless legs syndrome: G25.81

## 2011-09-12 HISTORY — DX: Cellulitis of unspecified part of limb: L02.419

## 2011-09-12 HISTORY — DX: Atherosclerotic heart disease of native coronary artery without angina pectoris: I25.10

## 2011-09-12 HISTORY — DX: Sleep apnea, unspecified: G47.30

## 2011-09-12 LAB — URINE MICROSCOPIC-ADD ON

## 2011-09-12 LAB — COMPREHENSIVE METABOLIC PANEL
ALT: 16 U/L (ref 0–53)
AST: 20 U/L (ref 0–37)
AST: 23 U/L (ref 0–37)
Albumin: 3.4 g/dL — ABNORMAL LOW (ref 3.5–5.2)
Alkaline Phosphatase: 54 U/L (ref 39–117)
BUN: 48 mg/dL — ABNORMAL HIGH (ref 6–23)
Calcium: 9.4 mg/dL (ref 8.4–10.5)
Chloride: 92 mEq/L — ABNORMAL LOW (ref 96–112)
Creatinine, Ser: 2.57 mg/dL — ABNORMAL HIGH (ref 0.50–1.35)
GFR calc Af Amer: 24 mL/min — ABNORMAL LOW (ref >90)
Glucose, Bld: 64 mg/dL — ABNORMAL LOW (ref 70–99)
Potassium: 5.2 mEq/L — ABNORMAL HIGH (ref 3.5–5.1)
Sodium: 133 mEq/L — ABNORMAL LOW (ref 135–145)
Total Bilirubin: 0.3 mg/dL (ref 0.3–1.2)
Total Protein: 5.4 g/dL — ABNORMAL LOW (ref 6.0–8.3)

## 2011-09-12 LAB — DIFFERENTIAL
Basophils Absolute: 0 10*3/uL (ref 0.0–0.1)
Basophils Relative: 0 % (ref 0–1)
Eosinophils Absolute: 0 10*3/uL (ref 0.0–0.7)
Eosinophils Absolute: 0 10*3/uL (ref 0.0–0.7)
Eosinophils Relative: 0 % (ref 0–5)
Lymphocytes Relative: 11 % — ABNORMAL LOW (ref 12–46)
Lymphs Abs: 0.6 10*3/uL — ABNORMAL LOW (ref 0.7–4.0)
Monocytes Absolute: 1 10*3/uL (ref 0.1–1.0)
Monocytes Relative: 7 % (ref 3–12)
Neutro Abs: 12 10*3/uL — ABNORMAL HIGH (ref 1.7–7.7)
Neutrophils Relative %: 86 % — ABNORMAL HIGH (ref 43–77)

## 2011-09-12 LAB — URINALYSIS, ROUTINE W REFLEX MICROSCOPIC
Glucose, UA: 100 mg/dL — AB
Ketones, ur: NEGATIVE mg/dL
Leukocytes, UA: NEGATIVE
Nitrite: NEGATIVE
Protein, ur: 30 mg/dL — AB
pH: 5.5 (ref 5.0–8.0)

## 2011-09-12 LAB — CBC
HCT: 31.5 % — ABNORMAL LOW (ref 39.0–52.0)
HCT: 35.6 % — ABNORMAL LOW (ref 39.0–52.0)
Hemoglobin: 10.8 g/dL — ABNORMAL LOW (ref 13.0–17.0)
Hemoglobin: 12.3 g/dL — ABNORMAL LOW (ref 13.0–17.0)
MCH: 35 pg — ABNORMAL HIGH (ref 26.0–34.0)
MCH: 35.2 pg — ABNORMAL HIGH (ref 26.0–34.0)
MCH: 34.2 pg — ABNORMAL HIGH (ref 26.0–34.0)
MCHC: 34.3 g/dL (ref 30.0–36.0)
MCHC: 33.4 g/dL (ref 30.0–36.0)
MCV: 101.9 fL — ABNORMAL HIGH (ref 78.0–100.0)
Platelets: 121 10*3/uL — ABNORMAL LOW (ref 150–400)
RBC: 3.49 MIL/uL — ABNORMAL LOW (ref 4.22–5.81)
RBC: 2.84 MIL/uL — ABNORMAL LOW (ref 4.22–5.81)
RDW: 13 % (ref 11.5–15.5)
RDW: 13 % (ref 11.5–15.5)

## 2011-09-12 LAB — LIPASE, BLOOD: Lipase: 18 U/L (ref 11–59)

## 2011-09-12 LAB — GLUCOSE, CAPILLARY
Glucose-Capillary: 51 mg/dL — ABNORMAL LOW (ref 70–99)
Glucose-Capillary: 145 mg/dL — ABNORMAL HIGH (ref 70–99)
Glucose-Capillary: 85 mg/dL (ref 70–99)

## 2011-09-12 LAB — BLOOD GAS, ARTERIAL
Acid-base deficit: 7 mmol/L — ABNORMAL HIGH (ref 0.0–2.0)
Bicarbonate: 17.7 mEq/L — ABNORMAL LOW (ref 20.0–24.0)
FIO2: 0.4 %
O2 Saturation: 96.7 %
pO2, Arterial: 87.6 mmHg (ref 80.0–100.0)

## 2011-09-12 LAB — LACTIC ACID, PLASMA: Lactic Acid, Venous: 1.2 mmol/L (ref 0.5–2.2)

## 2011-09-12 LAB — OCCULT BLOOD GASTRIC / DUODENUM (SPECIMEN CUP)
Occult Blood, Gastric: POSITIVE — AB
pH, Gastric: 4

## 2011-09-12 LAB — ABO/RH: ABO/RH(D): O POS

## 2011-09-12 LAB — PROTIME-INR: INR: 1.17 (ref 0.00–1.49)

## 2011-09-12 SURGERY — LAPAROTOMY, EXPLORATORY
Anesthesia: General | Site: Esophagus | Wound class: Contaminated

## 2011-09-12 MED ORDER — MORPHINE SULFATE 4 MG/ML IJ SOLN
4.0000 mg | Freq: Once | INTRAMUSCULAR | Status: AC
  Administered 2011-09-12: 4 mg via INTRAVENOUS
  Filled 2011-09-12: qty 1

## 2011-09-12 MED ORDER — HEPARIN SODIUM (PORCINE) 5000 UNIT/ML IJ SOLN
5000.0000 [IU] | Freq: Three times a day (TID) | INTRAMUSCULAR | Status: DC
  Administered 2011-09-13 – 2011-09-15 (×7): 5000 [IU] via SUBCUTANEOUS
  Filled 2011-09-12 (×10): qty 1

## 2011-09-12 MED ORDER — PROMETHAZINE HCL 25 MG/ML IJ SOLN
6.2500 mg | Freq: Four times a day (QID) | INTRAMUSCULAR | Status: DC | PRN
  Filled 2011-09-12: qty 1

## 2011-09-12 MED ORDER — FENTANYL CITRATE 0.05 MG/ML IJ SOLN
25.0000 ug | INTRAMUSCULAR | Status: DC | PRN
  Administered 2011-09-12 – 2011-09-13 (×2): 50 ug via INTRAVENOUS
  Administered 2011-09-14 (×2): 25 ug via INTRAVENOUS
  Administered 2011-09-14 – 2011-09-15 (×2): 50 ug via INTRAVENOUS
  Filled 2011-09-12 (×4): qty 2

## 2011-09-12 MED ORDER — SODIUM CHLORIDE 0.9 % IV SOLN
80.0000 mg | Freq: Once | INTRAVENOUS | Status: AC
  Administered 2011-09-12: 80 mg via INTRAVENOUS
  Filled 2011-09-12: qty 80

## 2011-09-12 MED ORDER — SODIUM CHLORIDE 0.9 % IV SOLN
10.0000 mg | INTRAVENOUS | Status: DC | PRN
  Administered 2011-09-12: 50 ug/min via INTRAVENOUS

## 2011-09-12 MED ORDER — PHENYLEPHRINE HCL 10 MG/ML IJ SOLN
30.0000 ug/min | INTRAVENOUS | Status: DC
  Administered 2011-09-12: 30 ug/min via INTRAVENOUS
  Filled 2011-09-12: qty 1

## 2011-09-12 MED ORDER — DEXTROSE 50 % IV SOLN
INTRAVENOUS | Status: AC
  Administered 2011-09-12: 50 mL via INTRAVENOUS
  Filled 2011-09-12: qty 50

## 2011-09-12 MED ORDER — ERTAPENEM SODIUM 1 G IJ SOLR
1.0000 g | INTRAMUSCULAR | Status: DC
  Administered 2011-09-12: 1 g via INTRAVENOUS
  Filled 2011-09-12 (×2): qty 1

## 2011-09-12 MED ORDER — PROPOFOL 10 MG/ML IV BOLUS
INTRAVENOUS | Status: DC | PRN
  Administered 2011-09-12: 100 mg via INTRAVENOUS

## 2011-09-12 MED ORDER — LACTATED RINGERS IV SOLN
INTRAVENOUS | Status: DC
  Administered 2011-09-13: via INTRAVENOUS

## 2011-09-12 MED ORDER — ETOMIDATE 2 MG/ML IV SOLN
INTRAVENOUS | Status: DC | PRN
  Administered 2011-09-12: 10 mg via INTRAVENOUS

## 2011-09-12 MED ORDER — SODIUM CHLORIDE 0.9 % IV SOLN
INTRAVENOUS | Status: DC | PRN
  Administered 2011-09-12 (×3): via INTRAVENOUS

## 2011-09-12 MED ORDER — ONDANSETRON HCL 4 MG/2ML IJ SOLN
4.0000 mg | Freq: Once | INTRAMUSCULAR | Status: AC
  Administered 2011-09-12: 4 mg via INTRAVENOUS
  Filled 2011-09-12: qty 2

## 2011-09-12 MED ORDER — LIDOCAINE HCL (CARDIAC) 20 MG/ML IV SOLN
INTRAVENOUS | Status: DC | PRN
  Administered 2011-09-12: 50 mg via INTRAVENOUS

## 2011-09-12 MED ORDER — FENTANYL CITRATE 0.05 MG/ML IJ SOLN
INTRAMUSCULAR | Status: AC
  Filled 2011-09-12: qty 2

## 2011-09-12 MED ORDER — PROMETHAZINE HCL 25 MG/ML IJ SOLN: 6.2500 mg | INTRAMUSCULAR | Status: DC | PRN

## 2011-09-12 MED ORDER — PIPERACILLIN-TAZOBACTAM 3.375 G IVPB
3.3750 g | Freq: Three times a day (TID) | INTRAVENOUS | Status: AC
  Administered 2011-09-13 – 2011-09-19 (×20): 3.375 g via INTRAVENOUS
  Filled 2011-09-12 (×22): qty 50

## 2011-09-12 MED ORDER — MORPHINE SULFATE 2 MG/ML IJ SOLN
1.0000 mg | INTRAMUSCULAR | Status: DC | PRN
  Administered 2011-09-12: 4 mg via INTRAVENOUS
  Filled 2011-09-12: qty 2

## 2011-09-12 MED ORDER — MIDAZOLAM HCL 5 MG/ML IJ SOLN
1.0000 mg | INTRAMUSCULAR | Status: DC | PRN
  Administered 2011-09-15: 2 mg via INTRAVENOUS
  Filled 2011-09-12: qty 1

## 2011-09-12 MED ORDER — SUCCINYLCHOLINE CHLORIDE 20 MG/ML IJ SOLN
INTRAMUSCULAR | Status: DC | PRN
  Administered 2011-09-12: 100 mg via INTRAVENOUS

## 2011-09-12 MED ORDER — PHENYLEPHRINE HCL 10 MG/ML IJ SOLN
INTRAMUSCULAR | Status: DC | PRN
  Administered 2011-09-12: 120 ug via INTRAVENOUS

## 2011-09-12 MED ORDER — PANTOPRAZOLE SODIUM 40 MG IV SOLR: 40.0000 mg | Freq: Two times a day (BID) | INTRAVENOUS | Status: DC

## 2011-09-12 MED ORDER — CISATRACURIUM BESYLATE 2 MG/ML IV SOLN
INTRAVENOUS | Status: DC | PRN
  Administered 2011-09-12: 10 mg via INTRAVENOUS
  Administered 2011-09-12: 6 mg via INTRAVENOUS

## 2011-09-12 MED ORDER — 0.9 % SODIUM CHLORIDE (POUR BTL) OPTIME
TOPICAL | Status: DC | PRN
  Administered 2011-09-12: 5000 mL

## 2011-09-12 MED ORDER — SODIUM CHLORIDE 0.9 % IV BOLUS (SEPSIS)
1000.0000 mL | Freq: Once | INTRAVENOUS | Status: AC
  Administered 2011-09-12: 1000 mL via INTRAVENOUS

## 2011-09-12 MED ORDER — PIPERACILLIN-TAZOBACTAM 3.375 G IVPB
3.3750 g | INTRAVENOUS | Status: AC
  Administered 2011-09-13: 3.375 g via INTRAVENOUS
  Filled 2011-09-12: qty 50

## 2011-09-12 MED ORDER — SODIUM CHLORIDE 0.9 % IV SOLN
8.0000 mg/h | INTRAVENOUS | Status: DC
  Administered 2011-09-12 – 2011-09-18 (×10): 8 mg/h via INTRAVENOUS
  Filled 2011-09-12 (×25): qty 80

## 2011-09-12 MED ORDER — SODIUM CHLORIDE 0.9 % IV SOLN
INTRAVENOUS | Status: DC
  Administered 2011-09-12: 18:00:00 via INTRAVENOUS

## 2011-09-12 MED ORDER — FENTANYL CITRATE 0.05 MG/ML IJ SOLN
INTRAMUSCULAR | Status: DC | PRN
  Administered 2011-09-12: 100 ug via INTRAVENOUS
  Administered 2011-09-12: 50 ug via INTRAVENOUS
  Administered 2011-09-12: 100 ug via INTRAVENOUS

## 2011-09-12 MED ORDER — SODIUM CHLORIDE 0.9 % IV BOLUS (SEPSIS)
500.0000 mL | Freq: Once | INTRAVENOUS | Status: AC
  Administered 2011-09-12: 500 mL via INTRAVENOUS

## 2011-09-12 MED ORDER — DEXTROSE 50 % IV SOLN
50.0000 mL | Freq: Once | INTRAVENOUS | Status: AC
  Administered 2011-09-12: 50 mL via INTRAVENOUS
  Administered 2011-09-13: 25 mL via INTRAVENOUS

## 2011-09-12 MED ORDER — STERILE WATER FOR IRRIGATION IR SOLN
Status: DC | PRN
  Administered 2011-09-12: 500 mL

## 2011-09-12 MED ORDER — MIDAZOLAM HCL 5 MG/ML IJ SOLN
INTRAMUSCULAR | Status: AC
  Administered 2011-09-12: 2 mg
  Filled 2011-09-12: qty 1

## 2011-09-12 SURGICAL SUPPLY — 62 items
APPLICATOR COTTON TIP 6IN STRL (MISCELLANEOUS) ×6 IMPLANT
BANDAGE GAUZE ELAST BULKY 4 IN (GAUZE/BANDAGES/DRESSINGS) ×1 IMPLANT
BLADE EXTENDED COATED 6.5IN (ELECTRODE) IMPLANT
BLADE HEX COATED 2.75 (ELECTRODE) ×3 IMPLANT
CANISTER SUCTION 2500CC (MISCELLANEOUS) ×2 IMPLANT
CHLORAPREP W/TINT 26ML (MISCELLANEOUS) ×3 IMPLANT
CLOTH BEACON ORANGE TIMEOUT ST (SAFETY) ×3 IMPLANT
COVER MAYO STAND STRL (DRAPES) ×1 IMPLANT
DRAIN CHANNEL 19F RND (DRAIN) ×2 IMPLANT
DRAPE LAPAROSCOPIC ABDOMINAL (DRAPES) ×3 IMPLANT
DRAPE WARM FLUID 44X44 (DRAPE) ×1 IMPLANT
DRSG PAD ABDOMINAL 8X10 ST (GAUZE/BANDAGES/DRESSINGS) ×1 IMPLANT
ELECT REM PT RETURN 9FT ADLT (ELECTROSURGICAL) ×3
ELECTRODE REM PT RTRN 9FT ADLT (ELECTROSURGICAL) ×2 IMPLANT
EVACUATOR SILICONE 100CC (DRAIN) ×2 IMPLANT
GLOVE BIO SURGEON STRL SZ7 (GLOVE) ×2 IMPLANT
GLOVE BIO SURGEON STRL SZ8 (GLOVE) ×1 IMPLANT
GLOVE BIOGEL PI IND STRL 7.0 (GLOVE) ×2 IMPLANT
GLOVE BIOGEL PI INDICATOR 7.0 (GLOVE)
GLOVE SS BIOGEL STRL SZ 7.5 (GLOVE) IMPLANT
GLOVE SUPERSENSE BIOGEL SZ 7.5 (GLOVE) ×1
GLOVE SURG SS PI 7.5 STRL IVOR (GLOVE) ×8 IMPLANT
GLOVE SURG SS PI 8.0 STRL IVOR (GLOVE) ×1 IMPLANT
GLOVE SURG SS PI 8.5 STRL IVOR (GLOVE) ×1
GLOVE SURG SS PI 8.5 STRL STRW (GLOVE) IMPLANT
GOWN PREVENTION PLUS LG XLONG (DISPOSABLE) ×2 IMPLANT
GOWN STRL NON-REIN LRG LVL3 (GOWN DISPOSABLE) ×2 IMPLANT
GOWN STRL REIN XL XLG (GOWN DISPOSABLE) ×8 IMPLANT
HOVERMATT SINGLE USE (MISCELLANEOUS) ×1 IMPLANT
KIT BASIN OR (CUSTOM PROCEDURE TRAY) ×3 IMPLANT
LIGASURE IMPACT 36 18CM CVD LR (INSTRUMENTS) IMPLANT
MANIFOLD NEPTUNE II (INSTRUMENTS) ×1 IMPLANT
NS IRRIG 1000ML POUR BTL (IV SOLUTION) ×9 IMPLANT
PACK GENERAL/GYN (CUSTOM PROCEDURE TRAY) ×3 IMPLANT
PLUG CATH AND CAP STER (CATHETERS) ×1 IMPLANT
SCALPEL HARMONIC ACE (MISCELLANEOUS) IMPLANT
SHEARS FOC LG CVD HARMONIC 17C (MISCELLANEOUS) IMPLANT
SLEEVE SURGEON STRL (DRAPES) ×4 IMPLANT
SPONGE DRAIN TRACH 4X4 STRL 2S (GAUZE/BANDAGES/DRESSINGS) ×3 IMPLANT
SPONGE GAUZE 4X4 12PLY (GAUZE/BANDAGES/DRESSINGS) ×3 IMPLANT
SPONGE LAP 18X18 X RAY DECT (DISPOSABLE) ×2 IMPLANT
STAPLER VISISTAT 35W (STAPLE) ×3 IMPLANT
SUCTION POOLE TIP (SUCTIONS) ×1 IMPLANT
SUT ETHILON 2 0 PS N (SUTURE) ×3 IMPLANT
SUT PDS AB 1 CTX 36 (SUTURE) IMPLANT
SUT PDS AB 1 TP1 96 (SUTURE) ×2 IMPLANT
SUT SILK 2 0 (SUTURE) ×3
SUT SILK 2 0 SH CR/8 (SUTURE) ×1 IMPLANT
SUT SILK 2-0 18XBRD TIE 12 (SUTURE) ×2 IMPLANT
SUT SILK 3 0 (SUTURE) ×3
SUT SILK 3 0 SH CR/8 (SUTURE) IMPLANT
SUT SILK 3-0 18XBRD TIE 12 (SUTURE) IMPLANT
SYR 20CC LL (SYRINGE) ×1 IMPLANT
TAPE CLOTH SURG 4X10 WHT LF (GAUZE/BANDAGES/DRESSINGS) ×1 IMPLANT
TOWEL OR 17X26 10 PK STRL BLUE (TOWEL DISPOSABLE) ×4 IMPLANT
TRAY FOLEY CATH 14FRSI W/METER (CATHETERS) ×1 IMPLANT
TUBE MOSS GAS 18FR (TUBING) ×1 IMPLANT
TUBING CONNECTING 10 (TUBING) ×2 IMPLANT
TUBING ENDO SMARTCAP (MISCELLANEOUS) ×1 IMPLANT
WATER STERILE IRR 1500ML POUR (IV SOLUTION) ×1 IMPLANT
WATER STERILE IRR 500ML POUR (IV SOLUTION) ×1 IMPLANT
YANKAUER SUCT BULB TIP NO VENT (SUCTIONS) ×1 IMPLANT

## 2011-09-12 NOTE — H&P (Signed)
[name] [name] is an 78 y.o. male.   Chief Complaint: Hypoglycemia and abdominal pain Primary Care:  DR. Sander Radon GI:  DR. Melburn Popper HPI: Patient is an 78 year old gentleman who started having abdominal pain approximately a week ago. Become progressively worse he's been unable to eat. He's had some problems with constipation. [facility] the nurse at him and his wife to see Dr. Sander Radon yesterday. Some lab studies were obtained but those are not currently available. Today his abdominal pain was worse and he was progressively weaker, he's developed nausea and vomiting. Glucose was low at [facility] and he was referred to the emergency room and [facility]. Labs from 10:55 AM shows a sodium of 1:30 a BUN of 48, a creatinine of 2.57, LFTs are normal, lipase is normal, lactic acid is 1.2. White count is 13,800, hemoglobin 10.8, hematocrit 31.5. Abdominal film at 11:54 AM, shows no acute abnormality cardiomegaly without pulmonary edema, bilateral left greater than the right. CT scan was obtained, it showed locules of free air in the upper abdomen adjacent to the liver. The distal stomach wall appears thickened with surrounding stranding and inflammation concerning for possible perforation. We were called to evaluate the patient, rectal exam on admission was normal and he did not have a tender abdomen. Exam prior obtaining cultures the abdomen the distended and tender. He seen in the emergency room now, he is vomiting repeatedly. Abdomen is markedly distended and tender with rebound. He was examined by Dr. Carolynne Edouard, it is his opinion patient most likely has a perforated ulcer. He recommended surgery. The patient and wife are fully aware of the risks, and they wished to proceed. Patient has a full code.  Past Medical History  Diagnosis Date  . Thyroid disease on thyroid supplement   . Hypertension   . CHF (congestive heart failure)   . Diabetes mellitus Stage III  Renal disease   . Coronary  artery disease s/p CABG 2001. His wife says he has not seen cardiology since   . Sleep apnea, he has CPAP, but will not use it./Restless leg syndrome   . Hyperlipemia    Obesity - BMI 35.9    H.x Osteomyelitis, with surgery Right shoulder age 39.   . Cellulitis and abscess of leg     Past Surgical History  Procedure Date  . Coronary artery bypass graft Surgery Right shoulder age 51 with major defect.     No family history on file. Social History:  reports that he has never smoked. He does not have any smokeless tobacco history on file. He reports that he does not drink alcohol or use illicit drugs.  Allergies:  Allergies  Allergen Reactions  . Enalapril Other (See Comments)    cough  . Milk-Related Compounds Other (See Comments)    Doesn't remember     Medications Prior to Admission  Medication Dose Route Frequency Provider Last Rate Last Dose  . 0.9 %  sodium chloride infusion   Intravenous Continuous Sherrie George, PA      . dextrose 50 % solution 50 mL  50 mL Intravenous Once Cyndra Numbers, MD   50 mL at 09/12/11 1054  . ertapenem (INVANZ) 1 g in sodium chloride 0.9 % 50 mL IVPB  1 g Intravenous Q24H Sherrie George, [place]      . morphine 2 MG/ML injection 1-4 mg  1-4 mg Intravenous Q1H PRN Sherrie George, PA      . morphine 4 MG/ML injection 4  mg  4 mg Intravenous Once Cyndra Numbers, MD   4 mg at 09/12/11 1607  . ondansetron (ZOFRAN) injection 4 mg  4 mg Intravenous Once Cyndra Numbers, MD   4 mg at 09/12/11 1101  . ondansetron (ZOFRAN) injection 4 mg  4 mg Intravenous Once Cyndra Numbers, MD   4 mg at 09/12/11 1404  . promethazine (PHENERGAN) injection 6.25-12.5 mg  6.25-12.5 mg Intravenous Q6H PRN Sherrie George, PA      . sodium chloride 0.9 % bolus 1,000 mL  1,000 mL Intravenous Once Cyndra Numbers, MD   1,000 mL at 09/12/11 1054   No current outpatient prescriptions on file as of 09/12/2011.   Home meds: Lantus 54 units subcutaneous at bedtime, calcium 600 mg every afternoon,  aspirin 81 mg daily, glipizide ER 2.5 mg daily Spiriva lactone 25 mg twice a day, levothyroxin 50 mcg daily, Lasix 40 mg daily, Mupirocin calcium external cream daily to external wound.  Results for orders placed during the hospital encounter of 09/12/11 (from the past 48 hour(s))  GLUCOSE, CAPILLARY     Status: Abnormal   Collection Time   09/12/11 10:25 AM      Component Value Range Comment   Glucose-Capillary 51 (*) 70 - 99 (mg/dL)   CBC     Status: Abnormal   Collection Time   09/12/11 10:55 AM      Component Value Range Comment   WBC 13.8 (*) 4.0 - 10.5 (K/uL)    RBC 3.09 (*) 4.22 - 5.81 (MIL/uL)    Hemoglobin 10.8 (*) 13.0 - 17.0 (g/dL)    HCT 16.1 (*) 09.6 - 52.0 (%)    MCV 101.9 (*) 78.0 - 100.0 (fL)    MCH 35.0 (*) 26.0 - 34.0 (pg)    MCHC 34.3  30.0 - 36.0 (g/dL)    RDW 04.5  40.9 - 81.1 (%)    Platelets 151  150 - 400 (K/uL)   DIFFERENTIAL     Status: Abnormal   Collection Time   09/12/11 10:55 AM      Component Value Range Comment   Neutrophils Relative 87 (*) 43 - 77 (%)    Neutro Abs 12.0 (*) 1.7 - 7.7 (K/uL)    Lymphocytes Relative 6 (*) 12 - 46 (%)    Lymphs Abs 0.8  0.7 - 4.0 (K/uL)    Monocytes Relative 7  3 - 12 (%)    Monocytes Absolute 1.0  0.1 - 1.0 (K/uL)    Eosinophils Relative 0  0 - 5 (%)    Eosinophils Absolute 0.0  0.0 - 0.7 (K/uL)    Basophils Relative 0  0 - 1 (%)    Basophils Absolute 0.0  0.0 - 0.1 (K/uL)   COMPREHENSIVE METABOLIC PANEL     Status: Abnormal   Collection Time   09/12/11 10:55 AM      Component Value Range Comment   Sodium 130 (*) 135 - 145 (mEq/L)    Potassium 4.9  3.5 - 5.1 (mEq/L)    Chloride 92 (*) 96 - 112 (mEq/L)    CO2 24  19 - 32 (mEq/L)    Glucose, Bld 51 (*) 70 - 99 (mg/dL)    BUN 48 (*) 6 - 23 (mg/dL)    Creatinine, Ser 9.14 (*) 0.50 - 1.35 (mg/dL)    Calcium 9.4  8.4 - 10.5 (mg/dL)    Total Protein 7.5  6.0 - 8.3 (g/dL)    Albumin 3.4 (*) 3.5 - 5.2 (g/dL)  AST 20  0 - 37 (U/L)    ALT 12  0 - 53 (U/L)     Alkaline Phosphatase 83  39 - 117 (U/L)    Total Bilirubin 0.3  0.3 - 1.2 (mg/dL)    GFR calc non Af Amer 21 (*) >90 (mL/min)    GFR calc Af Amer 24 (*) >90 (mL/min)   LIPASE, BLOOD     Status: Normal   Collection Time   09/12/11 10:55 AM      Component Value Range Comment   Lipase 18  11 - 59 (U/L)   PROTIME-INR     Status: Normal   Collection Time   09/12/11 10:55 AM      Component Value Range Comment   Prothrombin Time 15.1  11.6 - 15.2 (seconds)    INR 1.17  0.00 - 1.49    LACTIC ACID, PLASMA     Status: Normal   Collection Time   09/12/11 11:10 AM      Component Value Range Comment   Lactic Acid, Venous 1.2  0.5 - 2.2 (mmol/L)   POCT I-STAT TROPONIN I     Status: Normal   Collection Time   09/12/11 11:12 AM      Component Value Range Comment   Troponin i, poc 0.01  0.00 - 0.08 (ng/mL)    Comment 3            URINALYSIS, ROUTINE W REFLEX MICROSCOPIC     Status: Abnormal   Collection Time   09/12/11 11:40 AM      Component Value Range Comment   Color, Urine YELLOW  YELLOW     APPearance CLEAR  CLEAR     Specific Gravity, Urine 1.026  1.005 - 1.030     pH 5.5  5.0 - 8.0     Glucose, UA 100 (*) NEGATIVE (mg/dL)    Hgb urine dipstick TRACE (*) NEGATIVE     Bilirubin Urine NEGATIVE  NEGATIVE     Ketones, ur NEGATIVE  NEGATIVE (mg/dL)    Protein, ur 30 (*) NEGATIVE (mg/dL)    Urobilinogen, UA 0.2  0.0 - 1.0 (mg/dL)    Nitrite NEGATIVE  NEGATIVE     Leukocytes, UA NEGATIVE  NEGATIVE    URINE MICROSCOPIC-ADD ON     Status: Abnormal   Collection Time   09/12/11 11:40 AM      Component Value Range Comment   Squamous Epithelial / LPF MANY (*) RARE     WBC, UA 3-6  <3 (WBC/hpf)    RBC / HPF 0-2  <3 (RBC/hpf)    Bacteria, UA FEW (*) RARE     Casts HYALINE CASTS (*) NEGATIVE     Urine-Other MUCOUS PRESENT     GLUCOSE, CAPILLARY     Status: Abnormal   Collection Time   09/12/11 12:15 PM      Component Value Range Comment   Glucose-Capillary 145 (*) 70 - 99 (mg/dL)    Ct  Abdomen Pelvis Wo Contrast  09/12/2011  **ADDENDUM** CREATED: 09/12/2011 15:52:02  Critical Value/emergent results were called by telephone at the time of interpretation on 09/12/2011  at 3:50 p.m.  to  Dr. Alto Denver, who verbally acknowledged these results.  **END ADDENDUM** SIGNED BY: Aubery Lapping. Dover, M.D.    09/12/2011  *RADIOLOGY REPORT*  Clinical Data: Upper abdominal pain.  CT ABDOMEN AND PELVIS WITHOUT CONTRAST  Technique:  Multidetector CT imaging of the abdomen and pelvis was performed following the standard protocol without intravenous contrast.  Comparison: Plain films 09/12/2011  Findings: There are locules of extraluminal free air in the upper abdomen adjacent to the liver edge.  Findings compatible with bowel perforation.  The distal stomach appears abnormal with wall thickening and surrounding stranding/inflammation.  This is concerning for peptic ulcer disease and possibly the source of the perforation.  Small amount of free fluid in the pelvis. There is also perihepatic fluid which is nearly isointense to the liver and difficult to visualize.  This could represent blood or extravasated oral contrast.  No evidence of bowel obstruction.  Scattered colonic diverticula.  No active diverticulitis.  Aorta and iliac vessels are calcified, non-aneurysmal.  There is a small hiatal hernia. No focal lesion in the liver, spleen, pancreas, adrenals or kidneys on this unenhanced study.  Pancreas is atrophic.  Urinary bladder unremarkable.  No acute bony abnormality.  IMPRESSION: Locules of free air and high-density free fluid in the abdomen. Abnormal pyloric region of the stomach and possibly involving the first portion of the duodenum.  Findings concerning for perforated peptic ulcer disease.  Original Report Authenticated By: Cyndie Chime, M.D.   Dg Abd Acute W/chest  09/12/2011  *RADIOLOGY REPORT*  Clinical Data: Abdominal pain and distention.  Bilateral lower extremity swelling.  Constipation.  ACUTE ABDOMEN  SERIES (ABDOMEN 2 VIEW & CHEST 1 VIEW) 09/12/2011:  Comparison: No prior abdomen x-ray.  Two-view chest x-ray 01/23/2008 [facility].  Findings: Bowel gas pattern unremarkable without evidence of obstruction or significant ileus.  No evidence of free intraperitoneal air on the erect image.  Air-fluid levels in the stomach and in the colon.  Moderate stool burden involving the descending colon, sigmoid colon, and rectum.  No visible opaque urinary tract calculi.  Phlebolith low in the right side of the pelvis.  Degenerative changes involving the lumbar spine.  Prior sternotomy for CABG.  Cardiac silhouette mildly enlarged but stable.  Bilateral pleural effusions, left greater than right, with associated mild passive atelectasis in the lower lobes.  Lungs otherwise clear.  Pulmonary vascularity normal without evidence of pulmonary edema.  IMPRESSION:  1.  No acute abdominal abnormality.  Moderate stool burden. 2.  Stable cardiomegaly without pulmonary edema.  Bilateral pleural effusions, left greater than right, and associated passive atelectasis in the lower lobes.  Original Report Authenticated By: Arnell Sieving, M.D.    Review of Systems  Constitutional: Positive for fever (not sure). Negative for chills and weight loss.  HENT: Negative.   Eyes: Negative.   Respiratory: Positive for shortness of breath (DOE).        Sleep apnea he does not use his CPAP  Cardiovascular: Negative.   Gastrointestinal: Positive for nausea, vomiting, abdominal pain and constipation. Negative for diarrhea and blood in stool.  Genitourinary: Negative.   Musculoskeletal: Negative.   Skin: Negative.   Neurological: Negative.   Endo/Heme/Allergies: Negative.   Psychiatric/Behavioral:       Memory loss, no real dementia.  Lives at [facility] with his wife.  Very alert and appropriate.    Blood pressure 132/80, pulse 74, temperature 98.7 F (37.1 C), temperature source Oral, resp. rate 24, SpO2  94.00%. Physical Exam  Constitutional: He is oriented to person, place, and time. He appears well-developed.       Well developed obese WM  Appears pale, Vomiting, looks coffee ground  HENT:  Head: Normocephalic and atraumatic.  Nose: Nose normal.  Eyes: Conjunctivae and EOM are normal. Pupils are equal, round, and reactive to light.  Neck: Normal range  of motion. Neck supple. No JVD present. No tracheal deviation present. No thyromegaly present.  Cardiovascular: Regular rhythm, normal heart sounds and intact distal pulses.  Exam reveals no gallop.   No murmur heard.      Tachycardic  Respiratory: No respiratory distress. He has no wheezes. He has no rales.       Stomach is markedly distended and he's somewhat dyspenic.  BS down in both bases.  He has a major defect Right posterior shoulder since surgery age 30.  GI: He exhibits distension (Markedly distended, tender with rebound). He exhibits no mass. There is tenderness. There is rebound.  Musculoskeletal: He exhibits edema (trace edema both lower legs).  Lymphadenopathy:    He has no cervical adenopathy.  Neurological: He is alert and oriented to person, place, and time. No cranial nerve deficit.  Skin: Skin is warm. No rash noted. No erythema. There is pallor.  Psychiatric: He has a normal mood and affect. His behavior is normal. Judgment and thought content normal.       Normal considering he's vomitingl     Assessment/Plan 1. Pneumoperitoneum, with probable perforated gastric ulcer. 2. GI bleed3 3. Coronary artery disease, history of congestive heart failure. Status post CABG 2001. 4. And also insulin-dependent. 5. Stage III chronic renal, with renal insufficiency. 6. Dehydration 7. Bilateral pleural effusions on x-ray. 8. Hypertension 9. Dyslipidemia 10. Hypothyroid on supplement. 11. Morbid obesity with a BMI of 35 12. Sleep apnea/restless leg syndrome; he does not use CPAP.  Plan: Patient is being admitted, NG tube is  in place and 900 mL or drainage immediate. He's been typed and crossed for blood. He's been seen and evaluated by Dr. Carolynne Edouard recommended he be taken the OR for emergent exploration and repair of his ulcer. Critical care consult was been requested. We plan taken the operating room as soon as they are ready. Both patient and wife are where of the high-risk, and wished to proceed. Will [facility] physician assistant for Dr. Hortense Ramal.   Mykela Mewborn 09/12/2011, 5:02 PM

## 2011-09-12 NOTE — ED Notes (Signed)
Will, CCS PA at bedside.

## 2011-09-12 NOTE — ED Notes (Signed)
Pt returned from CT °

## 2011-09-12 NOTE — ED Notes (Signed)
Pt drinking CT contrast, finished one cup and c/o nausea. Alto Denver, MD informed and orders received. Pt medicated and encouraged to complete as much contrast as possible after medication begins working. Pt verbalizes understanding.

## 2011-09-12 NOTE — Progress Notes (Signed)
Patient ID: [name] [name], male   DOB: 11-20-1919, 78 y.o.   MRN: [id number] Patient seen and evaluated in the ER.  See full H/P by Zola Button.  He has had several months of early satiety with 1.5 weeks of feeling ill.  He appears to have a perforated ulcer from history and exam and from images.  He is diffusely tender and with the free air, I have discussed with him our recommendations for exploratory surgery and possible bowel resection or repair, patch, excision, or drainage of possible ulcer.  I also explained the high risk nature of this situation for him with the acute perforation, and his multiple medical problems.  We discussed the risks of the procedure and he desires to proceed with exploratory laparotomy and repair of perforation.

## 2011-09-12 NOTE — ED Notes (Signed)
Pt. given urinal. Advised needed urine sample.

## 2011-09-12 NOTE — Progress Notes (Signed)
ANTIBIOTIC CONSULT NOTE - INITIAL  Pharmacy Consult for Zosyn Indication: Perforated Duodenal Ulcer  Allergies  Allergen Reactions  . Enalapril Other (See Comments)    cough  . Milk-Related Compounds Other (See Comments)    Doesn't remember     Patient Measurements: Height: 6\' 1"  (185.4 cm) Weight: 227 lb 11.8 oz (103.3 kg) IBW/kg (Calculated) : 79.9    Vital Signs: Temp: 99 F (37.2 C) (02/13 2152) Temp src: Oral (02/13 2152) BP: 150/59 mmHg (02/13 2152) Pulse Rate: 84  (02/13 2152) Intake/Output from previous day:   Intake/Output from this shift: Total I/O In: 1500 [I.V.:1500] Out: 350 [Urine:350]  Labs:  [facility] 09/12/11 1724 09/12/11 1055  WBC 4.9 13.8*  HGB 12.3* 10.8*  PLT 150 151  LABCREA -- --  CREATININE -- 2.57*   Estimated Creatinine Clearance: 24.6 ml/min (by C-G formula based on Cr of 2.57). No results found for this basename: VANCOTROUGH:2,VANCOPEAK:2,VANCORANDOM:2,GENTTROUGH:2,GENTPEAK:2,GENTRANDOM:2,TOBRATROUGH:2,TOBRAPEAK:2,TOBRARND:2,AMIKACINPEAK:2,AMIKACINTROU:2,AMIKACIN:2, in the last 72 hours   Microbiology: No results found for this or any previous visit (from the past 720 hour(s)).  Medical History: Past Medical History  Diagnosis Date  . Thyroid disease   . Hypertension   . CHF (congestive heart failure)   . Diabetes mellitus   . Coronary artery disease   . Restless leg syndrome   . Hyperlipemia   . Renal disorder   . Sleep apnea   . Cellulitis and abscess of leg   . Obesity     Medications:  Scheduled:    . dextrose  50 mL Intravenous Once  . fentaNYL      . heparin  5,000 Units Subcutaneous Q8H  .  morphine injection  4 mg Intravenous Once  . ondansetron  4 mg Intravenous Once  . ondansetron  4 mg Intravenous Once  . pantoprazole (PROTONIX) IV  80 mg Intravenous Once  . piperacillin-tazobactam (ZOSYN)  IV  3.375 g Intravenous NOW  . piperacillin-tazobactam (ZOSYN)  IV  3.375 g Intravenous Q8H  . sodium chloride   1,000 mL Intravenous Once  . sodium chloride  500 mL Intravenous Once  . DISCONTD: ertapenem (INVANZ) IV  1 g Intravenous Q24H  . DISCONTD: pantoprazole (PROTONIX) IV  40 mg Intravenous Q12H   Infusions:    . lactated ringers    . pantoprozole (PROTONIX) infusion    . DISCONTD: sodium chloride 125 mL/hr at 09/12/11 1813   Assessment: 78 yo male with perforated duodenal ulcer.  Goal of Therapy:  Treat infection  Plan:  1.  Zosyn 3.375 Gm IV q8h. EI infusion. (CrCl~25 ml/min) 2.  F/U SCr/Levels as needed.  Susanne Greenhouse R 09/12/2011,10:21 PM

## 2011-09-12 NOTE — Anesthesia Postprocedure Evaluation (Signed)
  Anesthesia Post-op Note  Patient: [name] [name]  Procedure(s) Performed: Procedure(s) (LRB): EXPLORATORY LAPAROTOMY (N/A) UPPER GI ENDOSCOPY (N/A) GASTROSTOMY (Left)  Patient Location: PACU and ICU  Anesthesia Type: General  Level of Consciousness: Patient remains intubated per anesthesia plan  Airway and Oxygen Therapy: Patient placed on Ventilator (see vital sign flow sheet for setting)  Post-op Pain: none  Post-op Assessment: Post-op Vital signs reviewed, Patient's Cardiovascular Status Stable and Respiratory Function Stable  Post-op Vital Signs: Reviewed and stable  Complications: No apparent anesthesia complications

## 2011-09-12 NOTE — ED Notes (Signed)
[name] Quint, MD informed RN that OR should be ready in approx one hour. Consent obtained. Pre-op abx hung.

## 2011-09-12 NOTE — ED Notes (Signed)
Pt brought in by [facility] for abd pain that has been going on for a few days, worse this am. Had n/v a few days ago which has since resolved. Was seen at [facility] within last week and was given GI consult for 2 weeks. Last BM yesterday. Abd swollen and tight. C/o very mild sob.

## 2011-09-12 NOTE — Anesthesia Preprocedure Evaluation (Addendum)
Anesthesia Evaluation  Patient identified by MRN, date of birth, ID band Patient awake    Reviewed: Allergy & Precautions, H&P , NPO status , Patient's Chart, lab work & pertinent test results  History of Anesthesia Complications Negative for: history of anesthetic complications  Airway Mallampati: II TM Distance: >3 FB Neck ROM: Full    Dental  (+) Teeth Intact and Dental Advisory Given   Pulmonary neg pulmonary ROS, shortness of breath, sleep apnea (Noncomplaint with CPAP) ,  clear to auscultation Diminished respiratory excursion        Cardiovascular hypertension, Pt. on medications + CAD, + CABG (CABG X5 2001), +CHF and neg cardio ROS Regular Normal    Neuro/Psych Negative Neurological ROS  Negative Psych ROS   GI/Hepatic negative GI ROS, Neg liver ROS,   Endo/Other  Negative Endocrine ROSDiabetes mellitus-, Type 2, Insulin DependentHypothyroidism Morbid obesity  Renal/GU negative Renal ROS  Genitourinary negative   Musculoskeletal negative musculoskeletal ROS (+)   Abdominal   Peds negative pediatric ROS (+)  Hematology negative hematology ROS (+)   Anesthesia Other Findings   Reproductive/Obstetrics negative OB ROS                          Anesthesia Physical Anesthesia Plan  ASA: III and Emergent  Anesthesia Plan: General   Post-op Pain Management:    Induction: Intravenous, Rapid sequence and Cricoid pressure planned  Airway Management Planned: Oral ETT  Additional Equipment:   Intra-op Plan:   Post-operative Plan: Extubation in OR  Informed Consent: I have reviewed the patients History and Physical, chart, labs and discussed the procedure including the risks, benefits and alternatives for the proposed anesthesia with the patient or authorized representative who has indicated his/her understanding and acceptance.   Dental advisory given  Plan Discussed with:  CRNA  Anesthesia Plan Comments:        Anesthesia Quick Evaluation

## 2011-09-12 NOTE — Transfer of Care (Signed)
Immediate Anesthesia Transfer of Care Note  Patient: [name] [name]  Procedure(s) Performed: Procedure(s) (LRB): EXPLORATORY LAPAROTOMY (N/A) UPPER GI ENDOSCOPY (N/A) GASTROSTOMY (Left)  Patient Location: PACU  Anesthesia Type: General  Level of Consciousness: Patient remains intubated per anesthesia plan  Airway & Oxygen Therapy: Patient remains intubated per anesthesia plan  Post-op Assessment: Report given to PACU RN and Post -op Vital signs reviewed and stable  Post vital signs: Reviewed and stable  Complications: No apparent anesthesia complications

## 2011-09-12 NOTE — ED Notes (Signed)
WUJ:[id number]<JY> Expected date:09/12/11<BR> Expected time:10:04 AM<BR> Means of arrival:Ambulance<BR> Comments:<BR> Abdominal pain

## 2011-09-12 NOTE — Brief Op Note (Signed)
09/12/2011  9:50 PM  PATIENT:  [name] [name]  78 y.o. male  PRE-OPERATIVE DIAGNOSIS:  perforated ulcer  POST-OPERATIVE DIAGNOSIS:  perforated duodenal ulcer  PROCEDURE:  Procedure(s) (LRB): EXPLORATORY LAPAROTOMY (N/A) UPPER GI ENDOSCOPY (N/A) GASTROSTOMY (N/A) Closure and patch of duodenal ulcer with drainage  SURGEON:  Surgeon(s) and Role:    * Thomas A. Cornett, MD - Assisting    * Rulon Abide, DO - Primary  PHYSICIAN ASSISTANT:   ASSISTANTS: cornett    ANESTHESIA:   general  EBL:  Total I/O In: 1500 [I.V.:1500] Out: 350 [Urine:350]  BLOOD ADMINISTERED:none  DRAINS: (18F x2) Jackson-Pratt drain(s) with closed bulb suction in the duodenal region, Gastrostomy Tube and Jejunostomy Tube   LOCAL MEDICATIONS USED:  NONE  SPECIMEN:  Source of Specimen:  duodenal biopsies  DISPOSITION OF SPECIMEN:  PATHOLOGY  COUNTS:  YES  TOURNIQUET:  * No tourniquets in log *  DICTATION: .Other Dictation: Dictation Number [id number]  PLAN OF CARE: Admit to inpatient   PATIENT DISPOSITION:  ICU - intubated and critically ill.   Delay start of Pharmacological VTE agent (>24hrs) due to surgical blood loss or risk of bleeding: no

## 2011-09-12 NOTE — Anesthesia Procedure Notes (Signed)
Procedure Name: Intubation Date/Time: 09/12/2011 7:04 PM Performed by: Joycie Peek Pre-anesthesia Checklist: Patient identified, Timeout performed, Emergency Drugs available, Suction available and Patient being monitored Patient Re-evaluated:Patient Re-evaluated prior to inductionOxygen Delivery Method: Circle System Utilized Preoxygenation: Pre-oxygenation with 100% oxygen Intubation Type: IV induction Laryngoscope Size: Mac and 4 Grade View: Grade I Tube type: Oral Tube size: 8.0 mm Number of attempts: 1 Airway Equipment and Method: stylet Placement Confirmation: ETT inserted through vocal cords under direct vision,  breath sounds checked- equal and bilateral and positive ETCO2 Secured at: 23 cm Tube secured with: Tape Dental Injury: Teeth and Oropharynx as per pre-operative assessment

## 2011-09-12 NOTE — ED Provider Notes (Signed)
History     CSN: [id number]  Arrival date & time 09/12/11  1019   First MD Initiated Contact with Patient 09/12/11 1029      Chief Complaint  Patient presents with  . Abdominal Pain  . Hypoglycemia    (Consider location/radiation/quality/duration/timing/severity/associated sxs/prior treatment) HPI Patient is an 78 year old male who has had 4-5 days of abdominal pain. He was seen by his primary care physician Dr. Sander Radon yesterday. She sent lab work and referred him to see Dr. Ailene Ravel in 2 weeks.  Patient complains of some abdominal swelling as well as bilateral upper quadrant abdominal pain. He has some dementia and difficulty with memory near baseline. A friend is at the bedside reports that the patient had nausea and vomiting over the weekend but that this is resolved. He has been constipated though he did have a "good bowel movement" yesterday. There was no blood in this. Patient was being referred to gastroenterology to 2 concern over possible liver problems. He does not have any asterixis or jaundice today. He endorses some mild nausea and pain right now. He took Tylenol for this this morning. He does not feel he needs any pain medication for this right now. Upon arrival patient's glucose is 51. He has not been eating and drinking per his friend. He has been slightly more confused than usual.There are no other associated or modifying factors.  No past medical history on file.  No past surgical history on file.  No family history on file.  History  Substance Use Topics  . Smoking status: Not on file  . Smokeless tobacco: Not on file  . Alcohol Use: Not on file      Review of Systems  Constitutional: Negative.   HENT: Negative.   Eyes: Negative.   Respiratory: Negative.   Cardiovascular: Negative.   Gastrointestinal: Positive for nausea, vomiting, abdominal pain, constipation and abdominal distention. Negative for diarrhea and blood in stool.  Genitourinary: Negative.     Musculoskeletal: Negative.   Neurological: Negative.   Hematological: Negative.   Psychiatric/Behavioral: Negative.   All other systems reviewed and are negative.    Allergies  Review of patient's allergies indicates not on file.  Home Medications  No current outpatient prescriptions on file.  BP 143/60  Pulse 74  Temp(Src) 97.7 F (36.5 C) (Oral)  Resp 18  SpO2 96%  Physical Exam  Nursing note and vitals reviewed. Constitutional: He appears well-developed and well-nourished. No distress.  HENT:  Head: Normocephalic and atraumatic.  Eyes: Conjunctivae and EOM are normal. Pupils are equal, round, and reactive to light. No scleral icterus.  Neck: Normal range of motion.  Cardiovascular: Normal rate, regular rhythm, normal heart sounds and intact distal pulses.  Exam reveals no gallop and no friction rub.   No murmur heard. Pulmonary/Chest: Breath sounds normal. No respiratory distress. He has no wheezes. He has no rales.  Abdominal: Bowel sounds are normal. He exhibits distension. There is tenderness. There is no rebound and no guarding.       Mild bilateral upper quadrant tenderness to palpation  Genitourinary: Rectum normal. Guaiac negative stool.       No stool palpated in the rectal vault  Musculoskeletal: Normal range of motion.  Neurological: He is alert. No cranial nerve deficit. He exhibits normal muscle tone. Coordination normal.       Oriented to self and location only but not to year. This is reportedly baseline.  Skin: Skin is warm and dry. No rash noted.  Psychiatric: He  has a normal mood and affect.    ED Course  Procedures (including critical care time)   Date: 09/12/2011  Rate: 73  Rhythm: normal sinus rhythm  QRS Axis: normal  Intervals: PR prolonged  ST/T Wave abnormalities: nonspecific T wave changes  Conduction Disutrbances:first-degree A-V block   Narrative Interpretation:   Old EKG Reviewed: unchanged   Labs Reviewed  CBC - Abnormal;  Notable for the following:    WBC 13.8 (*)    RBC 3.09 (*)    Hemoglobin 10.8 (*)    HCT 31.5 (*)    MCV 101.9 (*)    MCH 35.0 (*)    All other components within normal limits  DIFFERENTIAL - Abnormal; Notable for the following:    Neutrophils Relative 87 (*)    Neutro Abs 12.0 (*)    Lymphocytes Relative 6 (*)    All other components within normal limits  COMPREHENSIVE METABOLIC PANEL - Abnormal; Notable for the following:    Sodium 130 (*)    Chloride 92 (*)    Glucose, Bld 51 (*)    BUN 48 (*)    Creatinine, Ser 2.57 (*)    Albumin 3.4 (*)    GFR calc non Af Amer 21 (*)    GFR calc Af Amer 24 (*)    All other components within normal limits  URINALYSIS, ROUTINE W REFLEX MICROSCOPIC - Abnormal; Notable for the following:    Glucose, UA 100 (*)    Hgb urine dipstick TRACE (*)    Protein, ur 30 (*)    All other components within normal limits  URINE MICROSCOPIC-ADD ON - Abnormal; Notable for the following:    Squamous Epithelial / LPF MANY (*)    Bacteria, UA FEW (*)    Casts HYALINE CASTS (*)    All other components within normal limits  LIPASE, BLOOD  LACTIC ACID, PLASMA  PROTIME-INR  POCT I-STAT TROPONIN I   Dg Abd Acute W/chest  09/12/2011  *RADIOLOGY REPORT*  Clinical Data: Abdominal pain and distention.  Bilateral lower extremity swelling.  Constipation.  ACUTE ABDOMEN SERIES (ABDOMEN 2 VIEW & CHEST 1 VIEW) 09/12/2011:  Comparison: No prior abdomen x-ray.  Two-view chest x-ray 01/23/2008 [facility].  Findings: Bowel gas pattern unremarkable without evidence of obstruction or significant ileus.  No evidence of free intraperitoneal air on the erect image.  Air-fluid levels in the stomach and in the colon.  Moderate stool burden involving the descending colon, sigmoid colon, and rectum.  No visible opaque urinary tract calculi.  Phlebolith low in the right side of the pelvis.  Degenerative changes involving the lumbar spine.  Prior sternotomy for CABG.  Cardiac  silhouette mildly enlarged but stable.  Bilateral pleural effusions, left greater than right, with associated mild passive atelectasis in the lower lobes.  Lungs otherwise clear.  Pulmonary vascularity normal without evidence of pulmonary edema.  IMPRESSION:  1.  No acute abdominal abnormality.  Moderate stool burden. 2.  Stable cardiomegaly without pulmonary edema.  Bilateral pleural effusions, left greater than right, and associated passive atelectasis in the lower lobes.  Original Report Authenticated By: Arnell Sieving, M.D.     1. Pneumoperitoneum       MDM  Patient presented today complaining of upper abdominal pain. Apparently his doctor had been concerned about some liver dysfunction. Patient's liver function was actually fine today. His lipase and lactate were also within normal limits. Patient had a slight leukocytosis of 13.8. Urinalysis did not demonstrate infection. Chest  x-ray showed bilateral pleural effusions but did not show any signs of infiltrate. Patient denied any cough or chest pain. EKG was unremarkable. A troponin was sent as well given patient's history of diabetes as well as his age and the location of his pain in upper abdomen. This was also negative. Acute abdominal series did show moderate amounts of stool. Air-fluid levels were also noted in the stomach and in the colon. Rectal exam did not reveal any signs of impaction or bloody stool. Patient has history of chronic kidney disease and creatinine of 2.57. Given this CT of abdomen and pelvis with oral contrast was performed.  4:02 PM CT of abdomen and pelvis was completed. Radiology called with patient results. Patient had evidence of pneumoperitoneum with possible perforation of the distal stomach or to want him. There is free air as well as fluid loculations. This was not seen previously on plain film. Patient was reassessed. He now has tense abdomen with increased pain. Vital signs are still stable. I spoke with Will  Marlyne Beards TPA for general surgery. Gen. surgery will see the patient.        Cyndra Numbers, MD 09/12/11 [phone]

## 2011-09-13 ENCOUNTER — Inpatient Hospital Stay (HOSPITAL_COMMUNITY): Payer: Medicare Other

## 2011-09-13 ENCOUNTER — Other Ambulatory Visit: Payer: Self-pay

## 2011-09-13 ENCOUNTER — Other Ambulatory Visit (INDEPENDENT_AMBULATORY_CARE_PROVIDER_SITE_OTHER): Payer: Self-pay | Admitting: General Surgery

## 2011-09-13 DIAGNOSIS — R579 Shock, unspecified: Secondary | ICD-10-CM

## 2011-09-13 DIAGNOSIS — I509 Heart failure, unspecified: Secondary | ICD-10-CM

## 2011-09-13 DIAGNOSIS — J9690 Respiratory failure, unspecified, unspecified whether with hypoxia or hypercapnia: Secondary | ICD-10-CM

## 2011-09-13 DIAGNOSIS — K269 Duodenal ulcer, unspecified as acute or chronic, without hemorrhage or perforation: Secondary | ICD-10-CM

## 2011-09-13 DIAGNOSIS — J9600 Acute respiratory failure, unspecified whether with hypoxia or hypercapnia: Secondary | ICD-10-CM

## 2011-09-13 DIAGNOSIS — E119 Type 2 diabetes mellitus without complications: Secondary | ICD-10-CM

## 2011-09-13 DIAGNOSIS — N189 Chronic kidney disease, unspecified: Secondary | ICD-10-CM

## 2011-09-13 DIAGNOSIS — E1122 Type 2 diabetes mellitus with diabetic chronic kidney disease: Secondary | ICD-10-CM

## 2011-09-13 DIAGNOSIS — K260 Acute duodenal ulcer with hemorrhage: Secondary | ICD-10-CM

## 2011-09-13 LAB — COMPREHENSIVE METABOLIC PANEL
AST: 24 U/L (ref 0–37)
BUN: 40 mg/dL — ABNORMAL HIGH (ref 6–23)
CO2: 20 mEq/L (ref 19–32)
Calcium: 7 mg/dL — ABNORMAL LOW (ref 8.4–10.5)
Creatinine, Ser: 2.28 mg/dL — ABNORMAL HIGH (ref 0.50–1.35)
GFR calc Af Amer: 28 mL/min — ABNORMAL LOW (ref >90)
GFR calc non Af Amer: 24 mL/min — ABNORMAL LOW (ref >90)
Glucose, Bld: 80 mg/dL (ref 70–99)

## 2011-09-13 LAB — BLOOD GAS, ARTERIAL
Acid-base deficit: 6.5 mmol/L — ABNORMAL HIGH (ref 0.0–2.0)
Bicarbonate: 18 mEq/L — ABNORMAL LOW (ref 20.0–24.0)
FIO2: 0.4 %
O2 Saturation: 97.7 %
Patient temperature: 99.4
Pressure control: 15 cmH2O
pO2, Arterial: 98.6 mmHg (ref 80.0–100.0)

## 2011-09-13 LAB — CARDIAC PANEL(CRET KIN+CKTOT+MB+TROPI)
CK, MB: 3.1 ng/mL (ref 0.3–4.0)
Relative Index: INVALID (ref 0.0–2.5)
Relative Index: 3.2 — ABNORMAL HIGH (ref 0.0–2.5)
Relative Index: 2.6 — ABNORMAL HIGH (ref 0.0–2.5)
Total CK: 87 U/L (ref 7–232)
Troponin I: <0.3 ng/mL (ref <0.30)
Troponin I: <0.3 ng/mL (ref <0.30)

## 2011-09-13 LAB — GLUCOSE, CAPILLARY
Glucose-Capillary: 108 mg/dL — ABNORMAL HIGH (ref 70–99)
Glucose-Capillary: 150 mg/dL — ABNORMAL HIGH (ref 70–99)
Glucose-Capillary: 150 mg/dL — ABNORMAL HIGH (ref 70–99)
Glucose-Capillary: 157 mg/dL — ABNORMAL HIGH (ref 70–99)
Glucose-Capillary: 67 mg/dL — ABNORMAL LOW (ref 70–99)
Glucose-Capillary: 69 mg/dL — ABNORMAL LOW (ref 70–99)
Glucose-Capillary: 87 mg/dL (ref 70–99)

## 2011-09-13 LAB — CBC
HCT: 28.7 % — ABNORMAL LOW (ref 39.0–52.0)
MCH: 35.5 pg — ABNORMAL HIGH (ref 26.0–34.0)
MCHC: 34.8 g/dL (ref 30.0–36.0)
RDW: 13.1 % (ref 11.5–15.5)

## 2011-09-13 LAB — H. PYLORI ANTIBODY, IGG: H Pylori IgG: 3.82 {ISR} — ABNORMAL HIGH

## 2011-09-13 MED ORDER — SODIUM CHLORIDE 0.9 % IV SOLN
INTRAVENOUS | Status: DC
  Administered 2011-09-13: 04:00:00 via INTRAVENOUS

## 2011-09-13 MED ORDER — DEXTROSE 10 % IV SOLN: INTRAVENOUS | Status: DC

## 2011-09-13 MED ORDER — NOREPINEPHRINE BITARTRATE 1 MG/ML IJ SOLN
2.0000 ug/min | INTRAVENOUS | Status: DC
  Administered 2011-09-13: 8 ug/min via INTRAVENOUS
  Administered 2011-09-13: 4 ug/min via INTRAVENOUS
  Administered 2011-09-13: 8 ug/min via INTRAVENOUS
  Administered 2011-09-13: 5 ug/min via INTRAVENOUS
  Administered 2011-09-13: 8 ug/min via INTRAVENOUS
  Administered 2011-09-14: 3 ug/min via INTRAVENOUS
  Filled 2011-09-13 (×3): qty 4

## 2011-09-13 MED ORDER — INSULIN ASPART 100 UNIT/ML ~~LOC~~ SOLN
0.0000 [IU] | SUBCUTANEOUS | Status: DC
  Administered 2011-09-13 – 2011-09-14 (×6): 1 [IU] via SUBCUTANEOUS
  Administered 2011-09-14 (×2): 3 [IU] via SUBCUTANEOUS
  Administered 2011-09-14 – 2011-09-15 (×4): 1 [IU] via SUBCUTANEOUS
  Administered 2011-09-15: 3 [IU] via SUBCUTANEOUS
  Administered 2011-09-15 – 2011-09-16 (×7): 1 [IU] via SUBCUTANEOUS
  Administered 2011-09-16: 3 [IU] via SUBCUTANEOUS
  Administered 2011-09-17: 1 [IU] via SUBCUTANEOUS
  Administered 2011-09-17: 3 [IU] via SUBCUTANEOUS
  Administered 2011-09-17 – 2011-09-18 (×8): 1 [IU] via SUBCUTANEOUS
  Administered 2011-09-18: 3 [IU] via SUBCUTANEOUS
  Administered 2011-09-19 (×3): 1 [IU] via SUBCUTANEOUS
  Administered 2011-09-19: 4 [IU] via SUBCUTANEOUS
  Administered 2011-09-20: 3 [IU] via SUBCUTANEOUS
  Administered 2011-09-20 (×3): 4 [IU] via SUBCUTANEOUS
  Filled 2011-09-13: qty 3

## 2011-09-13 MED ORDER — SODIUM CHLORIDE 0.9 % IV SOLN: INTRAVENOUS | Status: DC

## 2011-09-13 MED ORDER — DEXTROSE 50 % IV SOLN
INTRAVENOUS | Status: AC
  Administered 2011-09-13: 25 mL via INTRAVENOUS
  Filled 2011-09-13: qty 50

## 2011-09-13 MED ORDER — INSULIN ASPART 100 UNIT/ML ~~LOC~~ SOLN: 0.0000 [IU] | SUBCUTANEOUS | Status: DC

## 2011-09-13 MED ORDER — CHLORHEXIDINE GLUCONATE 0.12 % MT SOLN
15.0000 mL | Freq: Two times a day (BID) | OROMUCOSAL | Status: DC
  Administered 2011-09-13 – 2011-09-24 (×22): 15 mL via OROMUCOSAL
  Filled 2011-09-13 (×26): qty 15

## 2011-09-13 MED ORDER — DEXTROSE-NACL 5-0.9 % IV SOLN
INTRAVENOUS | Status: DC
  Administered 2011-09-13 – 2011-09-15 (×4): via INTRAVENOUS

## 2011-09-13 MED ORDER — SODIUM CHLORIDE 0.9 % IV BOLUS (SEPSIS)
500.0000 mL | Freq: Once | INTRAVENOUS | Status: AC
  Administered 2011-09-13: 500 mL via INTRAVENOUS

## 2011-09-13 MED ORDER — PHENYLEPHRINE HCL 10 MG/ML IJ SOLN
30.0000 ug/min | INTRAVENOUS | Status: DC
  Administered 2011-09-13: 100 ug/min via INTRAVENOUS
  Filled 2011-09-13: qty 2

## 2011-09-13 MED ORDER — BIOTENE DRY MOUTH MT LIQD
15.0000 mL | Freq: Four times a day (QID) | OROMUCOSAL | Status: DC
  Administered 2011-09-13 – 2011-09-24 (×38): 15 mL via OROMUCOSAL

## 2011-09-13 MED ORDER — SODIUM CHLORIDE 0.9 % IV BOLUS (SEPSIS)
1000.0000 mL | Freq: Once | INTRAVENOUS | Status: AC
  Administered 2011-09-13: 1000 mL via INTRAVENOUS

## 2011-09-13 NOTE — Clinical Documentation Improvement (Signed)
Abnormal Labs Clarification  THIS DOCUMENT IS NOT A PERMANENT PART OF THE MEDICAL RECORD  TO RESPOND TO THE THIS QUERY, FOLLOW THE INSTRUCTIONS BELOW:  1. If needed, update documentation for the patient's encounter via the notes activity.  2. Access this query again and click edit on the [employer].  3. After updating, or not, click F2 to complete all highlighted (required) fields concerning your review. Select "additional documentation in the medical record" OR "no additional documentation provided".  4. Click Sign note button.  5. The deficiency will fall out of your InBasket *Please let us know if you are not able to complete this workflow by phone or e-mail (listed below).  Please update your documentation within the medical record to reflect your response to this query.                                                                                   09/13/11  Dear Kathie Dike, B Marton Redwood  In a better effort to capture your patient's severity of illness, reflect appropriate length of stay and utilization of resources, a review of the medical record has revealed the following indicators.    Based on your clinical judgment, please clarify and document in a progress note and/or discharge summary the clinical condition associated with the following supporting information:  In responding to this query please exercise your independent judgment.  The fact that a query is asked, does not imply that any particular answer is desired or expected.  Abnormal findings (laboratory, x-ray, pathologic, and other diagnostic results) are not coded and reported unless the physician indicates their clinical significance.   The medical record reflects the following clinical findings, please clarify the diagnostic and/or clinical significance:      Pt with Pneumoperitoneum  w/probable perforated gastric ulcer.  Based on lab results BUN/CR/GFR 40/2.28/24 (in setting of dehydration)   Please  clarify the underlying diagnosis responsible abnormal lab results and document in pn and d/c summary. Thank you!    Possible Clinical Conditions?                                  Other Condition___________________                   Cannot Clinically Determine_________     Supporting Information:    Risk Factors: Pneumoperitoneum  w/probable perforated gastric ulcer, GIB  Signs and Symptoms Abd pain, NV   Diagnostic  Component BUN Creat  Latest Ref Rng 6 - 23 mg/dL 8.65 - 7.84 mg/dL  6/96/2952 43 (H) 8.41 (H)   Component BUN Creat  Latest Ref Rng 6 - 23 mg/dL 3.24 - 4.01 mg/dL  0/27/2536 40 (H) 6.44 (H)   Component GFR calc non Af Amer GFR calc Af Amer  Latest Ref Rng >90 mL/min >90 mL/min  09/12/2011 21 (L) 24 (L)   Component GFR calc non Af Amer GFR calc Af Amer  Latest Ref Rng >90 mL/min >90 mL/min  09/13/2011 24 (L) 28 (L)     Treatment: lactated ringers infusion  piperacillin-tazobactam (ZOSYN)  Reviewed:  no additional documentation provided   Thank You,  Enis Slipper  RN, BSN, CCDS Clinical Documentation Specialist [facility] Dept Pager: [phone] / E-mail: [email or web address]  Health Information Management [place]

## 2011-09-13 NOTE — Procedures (Signed)
Central Venous Catheter Insertion Procedure Note [name] [name] [id number] 05/17/1920  Procedure: Insertion of Central Venous Catheter Indications: Assessment of intravascular volume and Drug and/or fluid administration  Procedure Details Consent: Risks of procedure as well as the alternatives and risks of each were explained to the (patient/caregiver).  Consent for procedure obtained. Time Out: Verified patient identification, verified procedure, site/side was marked, verified correct patient position, special equipment/implants available, medications/allergies/relevent history reviewed, required imaging and test results available.  Performed  Maximum sterile technique was used including antiseptics, cap, gloves, gown, hand hygiene, mask and sheet. Skin prep: Chlorhexidine; local anesthetic administered A antimicrobial bonded/coated triple lumen catheter was placed in the right internal jugular vein using the Seldinger technique. Ultrasound used for vessel identification  Evaluation Blood flow good Complications: No apparent complications Patient did tolerate procedure well. Chest X-ray ordered to verify placement.  CXR: pending.  [name] [name], [place] 09/13/2011, 2:36 AM

## 2011-09-13 NOTE — Progress Notes (Signed)
CSW consulted for D/C planning . Pt is from [facility]. Spoke with SW at [facility]. SNF bed will be available at [facility] , if needed, at time of d/c . CSW will follow to assist with d/c planning.

## 2011-09-13 NOTE — Progress Notes (Signed)
1 Day Post-Op  Subjective: Low grade temp, SBP up to 90-120's.  On Low dose Neo at 16mcg/min. Slightly tachy 114 HR, looks, like sinus on monitor.He is alert and follows commands.  When ask how he feels he grimaces, but not as bad as last night  Objective: Vital signs in last 24 hours: Temp:  [97.7 F (36.5 C)-99.4 F (37.4 C)] 99.4 F (37.4 C) (02/14 0400) Pulse Rate:  [74-107] 107  (02/14 0700) Resp:  [14-24] 20  (02/14 0700) BP: (39-153)/(17-80) 127/52 mmHg (02/14 0700) SpO2:  [94 %-100 %] 99 % (02/14 0700) FiO2 (%):  [40 %] 40 % (02/14 0455) Weight:  [227 lb 11.8 oz (103.3 kg)] 227 lb 11.8 oz (103.3 kg) (02/13 2152)    Intake/Output from previous day: 02/13 0701 - 02/14 0700 In: 3417.5 [I.V.:2817.5; IV Piggyback:600] Out: 2110 [Urine:650; Drains:560] Intake/Output this shift:   ZO:XWRUE,  Follows commands, on Vent.  Chest: OK anterior. ABD: obese, dressing is dry, JP drain: serous, GJ tube to drainage, and nothing in cannister, some in the tubing. NO bowel sounds.  Lab Results:   Basename 09/13/11 0345 09/12/11 2230  WBC 9.2 5.4  HGB 10.0* 9.7*  HCT 28.7* 29.0*  PLT 153 121*    BMET  Basename 09/13/11 0345 09/12/11 2230  NA 132* 133*  K 5.1 5.2*  CL 103 102  CO2 20 22  GLUCOSE 80 64*  BUN 40* 43*  CREATININE 2.28* 2.53*  CALCIUM 7.0* 7.4*   PT/INR  Basename 09/12/11 1055  LABPROT 15.1  INR 1.17    Lab 09/13/11 0345 09/12/11 2230 09/12/11 1055  AST 24 23 20   ALT 15 16 12   ALKPHOS 53 54 83  BILITOT 0.3 0.3 0.3  PROT 5.4* 5.4* 7.5  ALBUMIN 2.2* 2.3* 3.4*     Studies/Results: Ct Abdomen Pelvis Wo Contrast  09/12/2011  **ADDENDUM** CREATED: 09/12/2011 15:52:02  Critical Value/emergent results were called by telephone at the time of interpretation on 09/12/2011  at 3:50 p.m.  to  Dr. Alto Denver, who verbally acknowledged these results.  **END ADDENDUM** SIGNED BY: Aubery Lapping. Dover, M.D.    09/12/2011  *RADIOLOGY REPORT*  Clinical Data: Upper abdominal pain.  CT  ABDOMEN AND PELVIS WITHOUT CONTRAST  Technique:  Multidetector CT imaging of the abdomen and pelvis was performed following the standard protocol without intravenous contrast.  Comparison: Plain films 09/12/2011  Findings: There are locules of extraluminal free air in the upper abdomen adjacent to the liver edge.  Findings compatible with bowel perforation.  The distal stomach appears abnormal with wall thickening and surrounding stranding/inflammation.  This is concerning for peptic ulcer disease and possibly the source of the perforation.  Small amount of free fluid in the pelvis. There is also perihepatic fluid which is nearly isointense to the liver and difficult to visualize.  This could represent blood or extravasated oral contrast.  No evidence of bowel obstruction.  Scattered colonic diverticula.  No active diverticulitis.  Aorta and iliac vessels are calcified, non-aneurysmal.  There is a small hiatal hernia. No focal lesion in the liver, spleen, pancreas, adrenals or kidneys on this unenhanced study.  Pancreas is atrophic.  Urinary bladder unremarkable.  No acute bony abnormality.  IMPRESSION: Locules of free air and high-density free fluid in the abdomen. Abnormal pyloric region of the stomach and possibly involving the first portion of the duodenum.  Findings concerning for perforated peptic ulcer disease.  Original Report Authenticated By: Cyndie Chime, M.D.   Dg Chest Port 1  View  09/13/2011  *RADIOLOGY REPORT*  Clinical Data: Line placement  PORTABLE CHEST - 1 VIEW  Comparison: 09/12/2011  Findings: Stable postoperative changes in the mediastinum. Endotracheal tube with tip about 5.3 cm above the carina.  Interval placement of right central venous catheter with tip over the mid SVC region.  No pneumothorax.  Shallow inspiration with atelectasis in the lung bases.  Mild cardiac enlargement.  Degenerative changes in the spine and shoulders.  Old fracture deformity of the left clavicle.  IMPRESSION:  Appliances in satisfactory apparent location.  Shallow inspiration with basilar atelectasis.  Original Report Authenticated By: Marlon Pel, M.D.   Dg Chest Port 1 View  09/12/2011  *RADIOLOGY REPORT*  Clinical Data: Endotracheal tube placement  PORTABLE CHEST - 1 VIEW  Comparison: 01/23/2008  Findings: Endotracheal tube was placed with tip about 4.5 cm above the carina.  Postoperative changes in the mediastinum with sternotomy wires and surgical clips and vascular markers present. Shallow inspiration.  Linear atelectasis in the lung bases and right midlung.  No apparent pneumothorax.  Calcified and tortuous aorta.  Degenerative changes in the shoulders.  IMPRESSION: Endotracheal tube tip is about 4.5 cm above the carina.  Shallow inspiration with basilar infiltration or atelectasis bilaterally.  Original Report Authenticated By: Marlon Pel, M.D.   Dg Abd Acute W/chest  09/12/2011  *RADIOLOGY REPORT*  Clinical Data: Abdominal pain and distention.  Bilateral lower extremity swelling.  Constipation.  ACUTE ABDOMEN SERIES (ABDOMEN 2 VIEW & CHEST 1 VIEW) 09/12/2011:  Comparison: No prior abdomen x-ray.  Two-view chest x-ray 01/23/2008 [facility].  Findings: Bowel gas pattern unremarkable without evidence of obstruction or significant ileus.  No evidence of free intraperitoneal air on the erect image.  Air-fluid levels in the stomach and in the colon.  Moderate stool burden involving the descending colon, sigmoid colon, and rectum.  No visible opaque urinary tract calculi.  Phlebolith low in the right side of the pelvis.  Degenerative changes involving the lumbar spine.  Prior sternotomy for CABG.  Cardiac silhouette mildly enlarged but stable.  Bilateral pleural effusions, left greater than right, with associated mild passive atelectasis in the lower lobes.  Lungs otherwise clear.  Pulmonary vascularity normal without evidence of pulmonary edema.  IMPRESSION:  1.  No acute abdominal  abnormality.  Moderate stool burden. 2.  Stable cardiomegaly without pulmonary edema.  Bilateral pleural effusions, left greater than right, and associated passive atelectasis in the lower lobes.  Original Report Authenticated By: Arnell Sieving, M.D.    Anti-infectives: Anti-infectives     Start     Dose/Rate Route Frequency Ordered Stop   09/13/11 0800  piperacillin-tazobactam (ZOSYN) IVPB 3.375 g       3.375 g 12.5 mL/hr over 240 Minutes Intravenous Every 8 hours 09/12/11 2221     09/12/11 2300  piperacillin-tazobactam (ZOSYN) IVPB 3.375 g       3.375 g 12.5 mL/hr over 240 Minutes Intravenous NOW 09/12/11 2221 09/13/11 0403   09/12/11 1800   ertapenem (INVANZ) 1 g in sodium chloride 0.9 % 50 mL IVPB  Status:  Discontinued        1 g 100 mL/hr over 30 Minutes Intravenous Every 24 hours 09/12/11 1656 09/12/11 2215         Current Facility-Administered Medications  Medication Dose Route Frequency Provider Last Rate Last Dose  . dextrose 10 % infusion   Intravenous Continuous Max Fickle, MD      . dextrose 5 %-0.9 % sodium chloride infusion  Intravenous Continuous Shelba Flake, MD 75 mL/hr at 09/13/11 0420    . dextrose 50 % solution 50 mL  50 mL Intravenous Once Cyndra Numbers, MD   25 mL at 09/13/11 0036  . fentaNYL (SUBLIMAZE) 0.05 MG/ML injection           . fentaNYL (SUBLIMAZE) injection 25-50 mcg  25-50 mcg Intravenous Q2H PRN Rakesh V. Vassie Loll, MD   50 mcg at 09/13/11 0102  . heparin injection 5,000 Units  5,000 Units Subcutaneous Q8H Rulon Abide, DO   5,000 Units at 09/13/11 0555  . insulin aspart (novoLOG) injection 0-4 Units  0-4 Units Subcutaneous Q4H Max Fickle, MD      . midazolam (VERSED) 5 MG/ML injection        2 mg at 09/12/11 2356  . midazolam (VERSED) injection 1-2 mg  1-2 mg Intravenous Q2H PRN Rakesh V. Vassie Loll, MD      . morphine 4 MG/ML injection 4 mg  4 mg Intravenous Once Cyndra Numbers, MD   4 mg at 09/12/11 1607  . norepinephrine (LEVOPHED)  4,000 mcg in dextrose 5 % 250 mL infusion  2-50 mcg/min Intravenous Continuous Max Fickle, MD 37.5 mL/hr at 09/13/11 0600 10 mcg/min at 09/13/11 0600  . ondansetron (ZOFRAN) injection 4 mg  4 mg Intravenous Once Cyndra Numbers, MD   4 mg at 09/12/11 1101  . ondansetron (ZOFRAN) injection 4 mg  4 mg Intravenous Once Cyndra Numbers, MD   4 mg at 09/12/11 1404  . pantoprazole (PROTONIX) 80 mg in sodium chloride 0.9 % 100 mL IVPB  80 mg Intravenous Once Rulon Abide, DO   80 mg at 09/12/11 2237  . pantoprazole (PROTONIX) 80 mg in sodium chloride 0.9 % 250 mL infusion  8 mg/hr Intravenous Continuous Rulon Abide, DO 25 mL/hr at 09/13/11 0600 8 mg/hr at 09/13/11 0600  . phenylephrine (NEO-SYNEPHRINE) 20,000 mcg in dextrose 5 % 250 mL infusion  30-200 mcg/min Intravenous Continuous Rulon Abide, DO   30 mcg/min at 09/13/11 0330  . piperacillin-tazobactam (ZOSYN) IVPB 3.375 g  3.375 g Intravenous NOW Rulon Abide, DO   3.375 g at 09/13/11 0003  . piperacillin-tazobactam (ZOSYN) IVPB 3.375 g  3.375 g Intravenous Q8H Rulon Abide, DO      . promethazine (PHENERGAN) injection 6.25-12.5 mg  6.25-12.5 mg Intravenous Q15 min PRN Einar Pheasant, MD      . sodium chloride 0.9 % bolus 1,000 mL  1,000 mL Intravenous Once Cyndra Numbers, MD   1,000 mL at 09/12/11 1054  . sodium chloride 0.9 % bolus 1,000 mL  1,000 mL Intravenous Once Max Fickle, MD   1,000 mL at 09/13/11 0230  . sodium chloride 0.9 % bolus 500 mL  500 mL Intravenous Once Rakesh V. Vassie Loll, MD   500 mL at 09/12/11 2252  . DISCONTD: 0.9 %  sodium chloride infusion   Intravenous Continuous Sherrie George, [place] 125 mL/hr at 09/12/11 1813    . DISCONTD: 0.9 %  sodium chloride infusion   Intravenous Continuous Shelba Flake, MD 100 mL/hr at 09/13/11 0342    . DISCONTD: 0.9 % irrigation (POUR BTL)    PRN Rulon Abide, DO   5,000 mL at 09/12/11 2025  . DISCONTD: dextrose 10 % infusion   Intravenous Continuous Max Fickle, MD      . DISCONTD: ertapenem [facility]) 1 g in sodium chloride 0.9 % 50 mL IVPB  1 g Intravenous Q24H Sherrie George, PA   1  g at 09/12/11 1809  . DISCONTD: insulin aspart (novoLOG) injection 0-4 Units  0-4 Units Subcutaneous Q4H Max Fickle, MD      . DISCONTD: insulin regular (NOVOLIN R,HUMULIN R) 1 Units/mL in sodium chloride 0.9 % 100 mL infusion   Intravenous Continuous Max Fickle, MD      . DISCONTD: lactated ringers infusion   Intravenous Continuous Rulon Abide, DO 130 mL/hr at 09/13/11 0002    . DISCONTD: morphine 2 MG/ML injection 1-4 mg  1-4 mg Intravenous Q1H PRN Sherrie George, PA   4 mg at 09/12/11 1734  . DISCONTD: pantoprazole (PROTONIX) injection 40 mg  40 mg Intravenous Q12H Sherrie George, [place]      . DISCONTD: phenylephrine (NEO-SYNEPHRINE) 10,000 mcg in dextrose 5 % 250 mL infusion  30-200 mcg/min Intravenous Continuous Rakesh V. Vassie Loll, MD 150 mL/hr at 09/12/11 2350 100 mcg/min at 09/12/11 2350  . DISCONTD: promethazine (PHENERGAN) injection 6.25-12.5 mg  6.25-12.5 mg Intravenous Q6H PRN Sherrie George, PA      . DISCONTD: sterile water for irrigation    PRN Rulon Abide, DO   500 mL at 09/12/11 2029   Facility-Administered Medications Ordered in Other Encounters  Medication Dose Route Frequency Provider Last Rate Last Dose  . DISCONTD: 0.9 %  sodium chloride infusion    Continuous PRN Joycie Peek, CRNA      . DISCONTD: cisatracurium (NIMBEX) injection    PRN Joycie Peek, CRNA   6 mg at 09/12/11 2024  . DISCONTD: etomidate (AMIDATE) injection    PRN Joycie Peek, CRNA   10 mg at 09/12/11 1904  . DISCONTD: fentaNYL (SUBLIMAZE) injection    PRN Joycie Peek, CRNA   100 mcg at 09/12/11 2124  . DISCONTD: lidocaine (cardiac) 100 mg/83ml (XYLOCAINE) 20 MG/ML injection 2%    PRN Joycie Peek, CRNA   50 mg at 09/12/11 1904  . DISCONTD: phenylephrine (NEO-SYNEPHRINE) 0.04 mg/mL in sodium chloride 0.9 % 250 mL infusion  10 mg  Continuous PRN Joycie Peek,  CRNA   25 mcg/min at 09/12/11 1943  . DISCONTD: phenylephrine (NEO-SYNEPHRINE) injection    PRN Joycie Peek, CRNA   120 mcg at 09/12/11 1905  . DISCONTD: propofol (DIPRIVAN) 10 mg/mL bolus    PRN Joycie Peek, CRNA   100 mg at 09/12/11 1904  . DISCONTD: succinylcholine (ANECTINE) injection    PRN Joycie Peek, CRNA   100 mg at 09/12/11 1904    Assessment/Plan 1.perforated duodenal ulcer;  2. s/pEXPLORATORY LAPAROTOMY (N/A)  UPPER GI ENDOSCOPY (N/A)  GASTROSTOMY (N/A)  Closure and patch of duodenal ulcer with drainage 3.Coronary artery disease, history of congestive heart failure. Status post CABG 2001.  4. And also insulin-dependent.  5. Stage III chronic renal, with renal insufficiency.  6. Dehydration  7. Bilateral pleural effusions on x-ray.  8. Hypertension  9. Dyslipidemia  10. Hypothyroid on supplement.  11. Morbid obesity with a BMI of 35  12. Sleep Apnea, refuses CPAP.  Plan:  Hopefully he can be extubated later today & pressor weaned by CCM.  Continue protonix drip, antibiotics, on Zosyn, hydrate, monitor Renal function. Consider when to start jejunostomy feeding.    LOS: 1 day    [name] [name] 09/13/2011

## 2011-09-13 NOTE — Progress Notes (Signed)
*  PRELIMINARY RESULTS* Echocardiogram 2D Echocardiogram has been performed.  [name] [name] R 09/13/2011, 2:12 PM 

## 2011-09-13 NOTE — Progress Notes (Signed)
Patient alert and wanting to communicate. Asked for pen and paper. Brought these. Offered support. Patient wrote to support wife. She was gone at the time, but will follow up. I know her from [facility].  [name] [name] will probably also come to support.  09/13/11 1000  Clinical Encounter Type  Visited With Patient  Visit Type Social support;Spiritual support  Recommendations Follow up with support for wife  Spiritual Encounters  Spiritual Needs Emotional  Stress Factors  Patient Stress Factors Other (Comment) (Concern for spouse, illness)

## 2011-09-13 NOTE — Progress Notes (Addendum)
Name: [name] [name] MRN: [id number] DOB: [birth year]  LOS: 1  PCCM Progress NOTE  History of Present Illness: 78 y/o male with CHF, CAD, DM was admitted to [facility] on 2/13 after he presented with abdominal pain> perf DU > OR for an emergent exlap and repair of his ulcer.  PCCM is consulted for assistance with vent management am 2/14  Lines / Drains: 2/13 ETT >>  Cultures / Sepsis markers: 2/14 Blood cx >> 2/14 MRSA >NEG   Antibiotics: 2/13 zosyn (?GI sepsis)>>  Tests / Events: 2/13 Ex lap, closure and patch of duodenal ulcer, jp drain x2, G-J tube placement    Brief overnight :  CC: f/u p resp failure Resting on vent, alert and follows commands well  Remains on pressors     Vital Signs:   Filed Vitals:   09/13/11 0500 09/13/11 0600 09/13/11 0700 09/13/11 0741  BP: 120/50 110/50 127/52 115/46  Pulse: 104 106 107 113  Temp:      TempSrc:      Resp: 23 18 20 19   Height:      Weight:      SpO2: 99% 99% 99% 98%    Intake/Output Summary (Last 24 hours) at 09/13/11 0915 Last data filed at 09/13/11 0600  Gross per 24 hour  Intake 3417.5 ml  Output   2110 ml  Net 1307.5 ml    CVP:  [8 mmHg-15 mmHg] 15 mmHg   Physical Examination: Gen: on vent, comfortable HEENT: NCAT, PERRL, EOMi, OP clear, ETT in place Neck: supple without masses PULM: Diminished on L > R CV: Tachy, no mgr, no JVD AB: BS absent, dressings and drains in place, Ext: warm, trace edema, no clubbing, no cyanosis, scd's in place Derm: no rash or skin breakdown Neuro: on vent, sedated but f/c   Labs     Lab 09/13/11 0345 09/12/11 2230 09/12/11 1055  NA 132* 133* 130*  K 5.1 5.2* 4.9  CL 103 102 92*  CO2 20 22 24   BUN 40* 43* 48*  CREATININE 2.28* 2.53* 2.57*  GLUCOSE 80 64* 51*    Lab 09/13/11 0345 09/12/11 2230 09/12/11 1724  HGB 10.0* 9.7* 12.3*  HCT 28.7* 29.0* 35.6*  WBC 9.2 5.4 4.9  PLT 153 121* 150    pcxr 2/14 bibasilar atx    Assessment and Plan:  Duodenal ulcer    Assessment: s/p open repair, currently not bleeding, empiric antibiotics ordered   Plan:  -post op care per surgery -monitor h/h -ppi gtt -agree with zosyn, -bc x 2  pending - monitor for sepsis/peritonitis  Acute resp failure     Assessment: came from OR intubated,d/t  pain, hypotension    Plan:  -full vent support - wean to extubate once off pressors  -daily sbt/wua - CXR in am   CHF     Assessment: +balance, CVP ~10    Plan:  -monitor CVP for further resuscitation needs -echo 2/14 >>>  CKD   Lab Results  Component Value Date   CREATININE 2.28* 09/13/2011   CREATININE 2.53* 09/12/2011   CREATININE 2.57* 09/12/2011      Assessment: stage III CKD at baseline   Plan:  -monitor UOP -renally dose meds  Shock    Assessment: likely due to volume loss from GIB, and post op sedation, does not appear to be in septic shock   Plan:  -cvp monitoring and vol exp as needed since cxr not wet  -follow  cardiac enzymes   -  2D TTE   Diabetes mellitus (09/13/2011)   Assessment:    Plan:  -icu hyperglycemia protocol  Code: Full, need to discuss further with wife when available   Best practices / Disposition:  Feeding/protein malnutrition: npo Analgesia: fentanyl prn Sedation: versed prn Thromboprophylaxis: scd HOB >30 degrees Ulcer prophylaxis: ppi gtt Glucose control/hyperglycemia: icu hyperglyecmia  PARRETT,TAMMY NP  [place] [facility]monary/Critical Care  09/13/2011, 8:48 AM  Pt independently  seen and examined and available cxr's reviewed and I agree with above findings/ imp/ plan   The patient is critically ill with multiple organ systems failure and requires high complexity decision making for assessment and support, frequent evaluation and titration of therapies, application of advanced monitoring technologies and extensive interpretation of multiple databases. Critical Care Time devoted to patient care services described in this note is 45 minutes.   Sandrea Hughs,  MD Pulmonary and Critical Care Medicine [facility] Cell [phone]

## 2011-09-13 NOTE — Consult Note (Signed)
Name: [name] [name] MRN: [id number] DOB: [birth year]  LOS: 1  CRITICAL CARE ADMISSION NOTE  History of Present Illness: 78 y/o male with CHF, CAD, DM was admitted to [facility] on 2/13 after he presented with abdominal pain.  He was found to have a perforated abdominal ulcer and was taken to the ER for an emergent exlap and repair of his ulcer.  PCCM is consulted for assistance with vent management.  Lines / Drains: 2/13 ETT >>  Cultures / Sepsis markers: 2/14 Blood cx >>  Antibiotics: 2/13 zosyn >>  Tests / Events: 2/13 Ex lap, closure and patch of duodenal ulcer, jp drain x2, G-J tube placement     Past Medical History  Diagnosis Date  . Thyroid disease   . Hypertension   . CHF (congestive heart failure)   . Diabetes mellitus   . Coronary artery disease   . Restless leg syndrome   . Hyperlipemia   . Renal disorder   . Sleep apnea   . Cellulitis and abscess of leg   . Obesity    Past Surgical History  Procedure Date  . Coronary artery bypass graft    Prior to Admission medications   Medication Sig Start Date End Date Taking? Authorizing Provider  aspirin (ASPIRIN EC) 81 MG EC tablet Take 81 mg by mouth daily. Swallow whole.   Yes Historical Provider, MD  Calcium Carbonate-Vitamin D (CALCIUM 600+D) 600-400 MG-UNIT per tablet Take 1 tablet by mouth daily.   Yes Historical Provider, MD  cholecalciferol (VITAMIN D) 1000 UNITS tablet Take 2,000 Units by mouth daily.   Yes Historical Provider, MD  furosemide (LASIX) 40 MG tablet Take 40 mg by mouth daily.   Yes Historical Provider, MD  glipiZIDE (GLUCOTROL XL) 2.5 MG 24 hr tablet Take 2.5 mg by mouth daily.   Yes Historical Provider, MD  insulin glargine (LANTUS) 100 UNIT/ML injection Inject 54 Units into the skin at bedtime.   Yes Historical Provider, MD  losartan (COZAAR) 50 MG tablet Take 50 mg by mouth daily.   Yes Historical Provider, MD  mupirocin ointment (BACTROBAN) 2 % Apply 1 application topically daily. Sores   Yes  Historical Provider, MD  Omega-3 Fatty Acids (FISH OIL) 1200 MG CAPS Take 1 capsule by mouth daily.   Yes Historical Provider, MD  spironolactone (ALDACTONE) 25 MG tablet Take 25 mg by mouth 2 (two) times daily.   Yes Historical Provider, MD   Allergies  Allergen Reactions  . Enalapril Other (See Comments)    cough  . Milk-Related Compounds Other (See Comments)    Doesn't remember    No family history on file. Social History  reports that he has never smoked. He does not have any smokeless tobacco history on file. He reports that he does not drink alcohol or use illicit drugs.  Review Of Systems   Cannot obtain due to sedation/intubation  Vital Signs:   Filed Vitals:   09/13/11 0000 09/13/11 0030 09/13/11 0045 09/13/11 0100  BP: 89/38 153/70 133/38 123/62  Pulse: 99 105 99 100  Temp: 99.2 F (37.3 C)     TempSrc: Oral     Resp: 14 21 19 22   Height:      Weight:      SpO2: 100% 100% 100% 100%    Physical Examination: Gen: on vent, comfortable HEENT: NCAT, PERRL, EOMi, OP clear, ETT in place Neck: supple without masses PULM: Diminished on L > R, scattered rhonchi CV: Tachy, no mgr, no JVD AB:  BS absent, dressings and drains in place, Ext: warm, trace edema, no clubbing, no cyanosis, scd's in place Derm: no rash or skin breakdown Neuro: on vent, sedated, comfortable, maew Psyche: cannot assess  Labs and Imaging:  CBC    Component Value Date/Time   WBC 5.4 09/12/2011 2230   RBC 2.84* 09/12/2011 2230   HGB 9.7* 09/12/2011 2230   HCT 29.0* 09/12/2011 2230   PLT 121* 09/12/2011 2230   MCV 102.1* 09/12/2011 2230   MCH 34.2* 09/12/2011 2230   MCHC 33.4 09/12/2011 2230   RDW 13.0 09/12/2011 2230   LYMPHSABS 0.6* 09/12/2011 2230   MONOABS 0.2 09/12/2011 2230   EOSABS 0.0 09/12/2011 2230   BASOSABS 0.0 09/12/2011 2230    BMET    Component Value Date/Time   NA 133* 09/12/2011 2230   K 5.2* 09/12/2011 2230   CL 102 09/12/2011 2230   CO2 22 09/12/2011 2230   GLUCOSE 64*  09/12/2011 2230   BUN 43* 09/12/2011 2230   CREATININE 2.53* 09/12/2011 2230   CALCIUM 7.4* 09/12/2011 2230   GFRNONAA 21* 09/12/2011 2230   GFRAA 24* 09/12/2011 2230     Assessment and Plan:  78 y/o male with multiple comorbidities presented to [facility] on 2/13 with a perforated abdominal ulcer.  He underwent surgical repair and drain placement today and tolerated the procedure well, he was kept intubated after the procedure and he has some post procedure hypotension and pain.  He does not appear to be septic at this time.  Duodenal ulcer (09/13/2011)   Assessment: s/p open repair, currently not bleeding, empiric antibiotics ordered   Plan:  -post op care per surgery -monitor h/h -ppi gtt -agree with zosyn, draw blood culture, monitor for sepsis/peritonitis  Respiratory failure (09/13/2011)   Assessment: came from OR intubated, mostly due to pain, has atelectasis and some pulm edema on CXR   Plan:  -full vent support -changed from [facility] to [facility] tonight for comfort, he is tolerating well -daily sbt/wua -daily CXR/ABG -likely extubation in AM  CHF (congestive heart failure) (09/13/2011)   Assessment: appears to have some pulm edema now, EF?   Plan:  -monitor CVP for further resuscitation needs  CKD (chronic kidney disease) (09/13/2011)   Assessment: stage III CKD at baseline   Plan:  -monitor UOP -renally dose meds  Shock (09/13/2011)   Assessment: likely due to volume loss from GIB, and post op sedation, does not appear to be in septic shock   Plan:  -cvp monitoring -EKG and cardiac enzymes now considering CAD and surgery earlier today -consider 2D TTE if still hypotensive  Diabetes mellitus (09/13/2011)   Assessment:    Plan:  -icu hyperglycemia protocol  Code: Full, need to discuss further with wife.  Best practices / Disposition:  Feeding/protein malnutrition: npo Analgesia: fentanyl prn Sedation: versed prn Thromboprophylaxis: scd HOB >30 degrees Ulcer prophylaxis: ppi  gtt Glucose control/hyperglycemia: icu hyperglyecmia  The patient is critically ill with multiple organ systems failure and requires high complexity decision making for assessment and support, frequent evaluation and titration of therapies, application of advanced monitoring technologies and extensive interpretation of multiple databases. Critical Care Time devoted to patient care services described in this note is 45 minutes.  Heber Routt, M.D. Pulmonary and Critical Care Medicine [facility] Pager: [phone]  09/13/2011, 1:34 AM

## 2011-09-13 NOTE — Op Note (Signed)
NAMEKERBY, BORNER NO.:  [id number]  MEDICAL RECORD NO.:  [id number]  LOCATION:  1233                         FACILITY:  [facility]  PHYSICIAN:  Lodema Pilot, MD       DATE OF BIRTH:  [birth year]  DATE OF PROCEDURE:  09/12/2011 DATE OF DISCHARGE:                              OPERATIVE REPORT   PROCEDURE:  Exploratory laparotomy with closure of perforated duodenal ulcer with Cheree Ditto patch.  Intraoperative esophagogastroduodenoscopy and placement of gastrojejunostomy feeding tube.  SURGEON:  Lodema Pilot, MD.  ASSISTANT:  Clovis Pu. Cornett, M.D.  PREOPERATIVE DIAGNOSIS:  Perforated ulcer.  POSTOPERATIVE DIAGNOSIS:  Perforated duodenal ulcer.  ANESTHESIA:  General endotracheal anesthesia.  FLUIDS:  2000 cc of crystalloid.  ESTIMATED BLOOD LOSS:  50 cc.  URINE OUTPUT:  350 cc.  DRAINS:  19-French Harrison Mons drain x2 placed in the area of the duodenal ulcer.  The lateral drain is posterior and the medial drain is anterior. 18-French gastrojejunostomy feeding tube.  SPECIMENS:  Duodenal biopsies sent to Pathology for permanent section.  COMPLICATIONS:  None apparent.  FINDINGS:  Large duodenal ulcer anteriorly with significant amount of surrounding inflammation and induration.  There was a free contamination within the abdomen of bilious fluid, attempted primary closure with Cheree Ditto patch and placement of an 18-French gastrojejunostomy tube.  He also had esophagitis found on EGD.  INDICATION FOR PROCEDURE:  [name] [name] is an 78 year old male, taking aspirin who has had a week and a half of abdominal pain, which has been increasing as well as abdominal distention and came into the emergency room for evaluation.  He had a physical exam with peritonitis and he had a CT scan, as well which demonstrated free air and thickening around the duodenum consistent with a perforated peptic ulcer disease.  OPERATIVE DETAILS:  [name] [name] was seen and evaluated in the  emergency room.  The risks and benefits of the procedure were discussed in lay terms.  Informed consent was obtained.  Antibiotics were administered, and he was taken to the operative room, placed on the table in supine position, and general endotracheal tube anesthesia was obtained.  His abdomen was prepped and draped in a standard surgical fashion.  A Foley catheter was placed.  Procedure time-out was performed with all operative team members to confirm proper patient, procedure, and upper midline incision was made in the skin and dissection carried down to the subcutaneous tissue using Bovie electrocautery.  The abdominal wall fascia was divided along the length of the incision and the peritoneum was entered.  He had an obvious contamination of the abdomen with murky green fluid which actually appeared bilious, and the abdomen was explored and he had an obvious duodenal ulcer identified.  The abdomen was suctioned and irrigated from the contamination fluid and self- retaining retractor was placed for retraction.  He had a large duodenal ulcer just distal to the pylorus with everted mucosa and encompassed approximately 50% of the wall of the duodenum.  The gallbladder was adhered to the area, but this was easily separated.  There was surrounding exudate and contamination and thickening of the surrounding tissues.  Biopsy was taken of the everting mucosa too, but there  was not any mass-like structure around.  Using 2-0 silk sutures, it approximated the serosa, tried to invert the mucosa, but the tissue was thickened and inflamed, but it actually did hold this sutures, and appeared to approximate the defect.  Using this, he had very thick omentum, and actually __________ I mobilized the falciform ligament, and from the abdominal wall, it was actually thought that this would be a better patch for the ulcer.  Using the tails of the previous placed sutures, the falciform ligament was secured  over the defect and another 2-0 silk stitch was used to tack it to the duodenum to cover the defect.  At this time, I had performed an EGD, which demonstrated significant esophagitis, and some dark coffee-ground type substance in the stomach, but the stomach otherwise appeared to be without significant ulcerations.  There was gastritis with no other obvious ulceration or tumor.  The scope was passed in the duodenum and the distal duodenum appeared normal.  The scope passed without difficulty beyond closure. There was actually no significant bubbling as well from the closure. Scope was removed and an 18-French Moss gastrojejunostomy feeding tube was placed through the abdominal wall, and 2 layers of 2-0 silk pursestring sutures were placed along the midbody of the stomach and a gastrotomy was made and the gastrojejunostomy tube was passed through the gastrotomy and advanced past the ulcer into the distal duodenum. The gastrostomy was created in the standard stem, formation with two layers of pursestring suture inverting the feeding tube, and 3-0 silk sutures were used to tack the stomach to the abdominal wall, and the balloon was inflated and the stomach was further pulled up to the abdominal wall.  The feeding tube was secured to the abdominal wall with nylon sutures and two 19-French Blake drains were placed through the right lateral abdomen.  The lateral drain was passed to the dependent portion of the portal region posterior to the ulcer and the medial drain was placed anterior to the liver and anterior to [facility] patch.  These were secured to the abdominal wall with a 2-0 nylon drain suture.  Then, the abdomen was again irrigated with sterile saline solution and the irrigation returned clear, and the abdomen was noted to be hemostatic. We did give a thought to a possible pyloric exclusion given the large ulcer, however, given his multiple medical problems, he was already on some  Neo-Synephrine for blood pressure support and given his age and the location of the ulcer, it was felt that true pyloric exclusion would not really be possible as this was basically right along the pylorus and we had to transect the stomach or close the antrum, but not truly across the pylorus and so we felt that the attempted patch and drainage would be the most prudent approach for him.  We then closed the abdominal wall fascia using #1 looped PDS x2, taking care to avoid injury to underlying bowel contents.  The wound was left open and packed with moist Kerlix gauze.  The wounds were dressed and the patient was left intubated and transferred to the intensive care unit in stable condition.          ______________________________ Lodema Pilot, MD     BL/MEDQ  D:  09/12/2011  T:  09/13/2011  Job:  [id number]

## 2011-09-14 ENCOUNTER — Inpatient Hospital Stay (HOSPITAL_COMMUNITY): Payer: Medicare Other

## 2011-09-14 LAB — PREPARE RBC (CROSSMATCH)

## 2011-09-14 LAB — CBC
Hemoglobin: 7.7 g/dL — ABNORMAL LOW (ref 13.0–17.0)
MCH: 35.2 pg — ABNORMAL HIGH (ref 26.0–34.0)
MCV: 101.8 fL — ABNORMAL HIGH (ref 78.0–100.0)
Platelets: 94 10*3/uL — ABNORMAL LOW (ref 150–400)
RBC: 2.19 MIL/uL — ABNORMAL LOW (ref 4.22–5.81)
WBC: 8.4 10*3/uL (ref 4.0–10.5)

## 2011-09-14 LAB — GLUCOSE, CAPILLARY
Glucose-Capillary: 161 mg/dL — ABNORMAL HIGH (ref 70–99)
Glucose-Capillary: 164 mg/dL — ABNORMAL HIGH (ref 70–99)

## 2011-09-14 LAB — BASIC METABOLIC PANEL
CO2: 20 mEq/L (ref 19–32)
Chloride: 106 mEq/L (ref 96–112)
Glucose, Bld: 171 mg/dL — ABNORMAL HIGH (ref 70–99)
Sodium: 133 mEq/L — ABNORMAL LOW (ref 135–145)

## 2011-09-14 MED ORDER — MIDAZOLAM HCL 5 MG/ML IJ SOLN
INTRAMUSCULAR | Status: AC
  Administered 2011-09-14: 2 mg
  Filled 2011-09-14: qty 1

## 2011-09-14 NOTE — Progress Notes (Signed)
2 Days Post-Op  Subjective: Awake and alert. Still on vent  Objective: Vital signs in last 24 hours: Temp:  [98.4 F (36.9 C)-100.7 F (38.2 C)] 98.4 F (36.9 C) (02/15 0509) Pulse Rate:  [75-119] 86  (02/15 0900) Resp:  [14-25] 23  (02/15 0900) BP: (86-132)/(39-72) 109/47 mmHg (02/15 0900) SpO2:  [96 %-100 %] 97 % (02/15 0900) FiO2 (%):  [30 %] 30 % (02/15 0800) Weight:  [236 lb 12.4 oz (107.4 kg)] 236 lb 12.4 oz (107.4 kg) (02/15 4098) Last BM Date:  (prior to admission )  Intake/Output from previous day: 02/14 0701 - 02/15 0700 In: 3024.8 [I.V.:2874.8; IV Piggyback:150] Out: 1970 [Urine:1200; Drains:770] Intake/Output this shift: Total I/O In: 247.6 [I.V.:222.6; IV Piggyback:25] Out: 100 [Urine:100]  GI: soft. appropriately tender. no bile in drains  Lab Results:   [facility] 09/14/11 0620 09/13/11 0345  WBC 8.4 9.2  HGB 7.7* 10.0*  HCT 22.3* 28.7*  PLT 94* 153   BMET  Basename 09/14/11 0620 09/13/11 0345  NA 133* 132*  K 4.0 5.1  CL 106 103  CO2 20 20  GLUCOSE 171* 80  BUN 27* 40*  CREATININE 1.93* 2.28*  CALCIUM 6.7* 7.0*   PT/INR  Basename 09/12/11 1055  LABPROT 15.1  INR 1.17   ABG  Basename 09/13/11 0513 09/12/11 2217  PHART 7.333* 7.325*  HCO3 18.0* 17.7*    Studies/Results: Ct Abdomen Pelvis Wo Contrast  09/12/2011  **ADDENDUM** CREATED: 09/12/2011 15:52:02  Critical Value/emergent results were called by telephone at the time of interpretation on 09/12/2011  at 3:50 p.m.  to  Dr. Alto Denver, who verbally acknowledged these results.  **END ADDENDUM** SIGNED BY: Aubery Lapping. Dover, M.D.    09/12/2011  *RADIOLOGY REPORT*  Clinical Data: Upper abdominal pain.  CT ABDOMEN AND PELVIS WITHOUT CONTRAST  Technique:  Multidetector CT imaging of the abdomen and pelvis was performed following the standard protocol without intravenous contrast.  Comparison: Plain films 09/12/2011  Findings: There are locules of extraluminal free air in the upper abdomen adjacent to  the liver edge.  Findings compatible with bowel perforation.  The distal stomach appears abnormal with wall thickening and surrounding stranding/inflammation.  This is concerning for peptic ulcer disease and possibly the source of the perforation.  Small amount of free fluid in the pelvis. There is also perihepatic fluid which is nearly isointense to the liver and difficult to visualize.  This could represent blood or extravasated oral contrast.  No evidence of bowel obstruction.  Scattered colonic diverticula.  No active diverticulitis.  Aorta and iliac vessels are calcified, non-aneurysmal.  There is a small hiatal hernia. No focal lesion in the liver, spleen, pancreas, adrenals or kidneys on this unenhanced study.  Pancreas is atrophic.  Urinary bladder unremarkable.  No acute bony abnormality.  IMPRESSION: Locules of free air and high-density free fluid in the abdomen. Abnormal pyloric region of the stomach and possibly involving the first portion of the duodenum.  Findings concerning for perforated peptic ulcer disease.  Original Report Authenticated By: Cyndie Chime, M.D.   Dg Chest Port 1 View  09/14/2011  *RADIOLOGY REPORT*  Clinical Data: Intubated patient of  PORTABLE CHEST - 1 VIEW  Comparison: 09/13/2011; 09/11/1937; 01/23/2008  Findings:  Grossly unchanged enlarged cardiac silhouette and mediastinal contours with atherosclerotic calcifications of the thoracic aorta. Post median sternotomy and CABG. Endotracheal tube overlies tracheal air column with tips through the carina.  Interval placement of enteric tube with tip overlying the superior/mid esophagus.  Unchanged positioning  of right jugular approach central venous catheter with tip overlying the superior cavoatrial junction.  Mild cephalization of flow without frank evidence of pulmonary edema.  There is blunting of bilateral costophrenic angles and grossly unchanged bibasilar heterogeneous / consolidative opacities, left greater than right.   No definite pneumothorax.  Unchanged bones.  IMPRESSION: 1.  Interval placement of enteric tube with tip over the superior/mid esophagus.  Repositioning and repeat radiograph is recommended. 2.  Otherwise, stable position of support apparatus.  No pneumothorax. 3.  Grossly unchanged low lung volumes, small effusions and bibasilar opacities, atelectasis versus infiltrate.  This is made call report.  Original Report Authenticated By: Waynard Reeds, M.D.   Dg Chest Port 1 View  09/13/2011  *RADIOLOGY REPORT*  Clinical Data: Line placement  PORTABLE CHEST - 1 VIEW  Comparison: 09/12/2011  Findings: Stable postoperative changes in the mediastinum. Endotracheal tube with tip about 5.3 cm above the carina.  Interval placement of right central venous catheter with tip over the mid SVC region.  No pneumothorax.  Shallow inspiration with atelectasis in the lung bases.  Mild cardiac enlargement.  Degenerative changes in the spine and shoulders.  Old fracture deformity of the left clavicle.  IMPRESSION: Appliances in satisfactory apparent location.  Shallow inspiration with basilar atelectasis.  Original Report Authenticated By: Marlon Pel, M.D.   Dg Chest Port 1 View  09/12/2011  *RADIOLOGY REPORT*  Clinical Data: Endotracheal tube placement  PORTABLE CHEST - 1 VIEW  Comparison: 01/23/2008  Findings: Endotracheal tube was placed with tip about 4.5 cm above the carina.  Postoperative changes in the mediastinum with sternotomy wires and surgical clips and vascular markers present. Shallow inspiration.  Linear atelectasis in the lung bases and right midlung.  No apparent pneumothorax.  Calcified and tortuous aorta.  Degenerative changes in the shoulders.  IMPRESSION: Endotracheal tube tip is about 4.5 cm above the carina.  Shallow inspiration with basilar infiltration or atelectasis bilaterally.  Original Report Authenticated By: Marlon Pel, M.D.   Dg Abd Acute W/chest  09/12/2011  *RADIOLOGY REPORT*   Clinical Data: Abdominal pain and distention.  Bilateral lower extremity swelling.  Constipation.  ACUTE ABDOMEN SERIES (ABDOMEN 2 VIEW & CHEST 1 VIEW) 09/12/2011:  Comparison: No prior abdomen x-ray.  Two-view chest x-ray 01/23/2008 [facility].  Findings: Bowel gas pattern unremarkable without evidence of obstruction or significant ileus.  No evidence of free intraperitoneal air on the erect image.  Air-fluid levels in the stomach and in the colon.  Moderate stool burden involving the descending colon, sigmoid colon, and rectum.  No visible opaque urinary tract calculi.  Phlebolith low in the right side of the pelvis.  Degenerative changes involving the lumbar spine.  Prior sternotomy for CABG.  Cardiac silhouette mildly enlarged but stable.  Bilateral pleural effusions, left greater than right, with associated mild passive atelectasis in the lower lobes.  Lungs otherwise clear.  Pulmonary vascularity normal without evidence of pulmonary edema.  IMPRESSION:  1.  No acute abdominal abnormality.  Moderate stool burden. 2.  Stable cardiomegaly without pulmonary edema.  Bilateral pleural effusions, left greater than right, and associated passive atelectasis in the lower lobes.  Original Report Authenticated By: Arnell Sieving, M.D.    Anti-infectives: Anti-infectives     Start     Dose/Rate Route Frequency Ordered Stop   09/13/11 0800   piperacillin-tazobactam (ZOSYN) IVPB 3.375 g        3.375 g 12.5 mL/hr over 240 Minutes Intravenous Every 8 hours  09/12/11 2221     09/12/11 2300   piperacillin-tazobactam (ZOSYN) IVPB 3.375 g        3.375 g 12.5 mL/hr over 240 Minutes Intravenous NOW 09/12/11 2221 09/13/11 0403   09/12/11 1800   ertapenem (INVANZ) 1 g in sodium chloride 0.9 % 50 mL IVPB  Status:  Discontinued        1 g 100 mL/hr over 30 Minutes Intravenous Every 24 hours 09/12/11 1656 09/12/11 2215          Assessment/Plan: s/p Procedure(s) (LRB): EXPLORATORY LAPAROTOMY  (N/A) UPPER GI ENDOSCOPY (N/A) GASTROSTOMY (N/A) continue bowel rest and g tube to suction Continue abx Continue protonix drip Critical care per ccm  LOS: 2 days    TOTH III,Betty Daidone S 09/14/2011

## 2011-09-14 NOTE — Progress Notes (Signed)
Name: [name] [name] MRN: [id number] DOB: 1920-05-11  LOS: 2  PCCM Progress NOTE  Brief patient profile:   78 y/o male with CHF, CAD, DM was admitted to [facility] on 2/13 after he presented with abdominal pain> perf DU > OR for an emergent exlap and repair of his ulcer.  PCCM is consulted for assistance with vent management am 2/14  Lines / Drains: 2/13 ETT >>  Cultures / Sepsis markers: 2/14 Blood cx >> 2/14 MRSA >>> neg  Antibiotics: 2/13 zosyn (?GI sepsis)>>  Tests / Events: 2/13 Ex lap, closure and patch of duodenal ulcer, jp drain x2, G-J tube placement  2/14 2 d Left ventricle: The cavity size was normal. Wall thickness was increased increased in a pattern of mild to moderate LVH. Systolic function was normal. The estimated ejection fraction was in the range of 60% to 65%. Wall motion was normal; there were no regional wall motion abnormalities.   Brief overnight :  Failed sbt due to increased rr, still pressor dep     Vital Signs:   Filed Vitals:   09/14/11 0930 09/14/11 1000 09/14/11 1030 09/14/11 1045  BP: 128/55 139/48 67/41 92/54   Pulse: 96 104 86 125  Temp:      TempSrc:      Resp: 23 22 17 18   Height:      Weight:      SpO2: 97% 97% 97% 100%    Intake/Output Summary (Last 24 hours) at 09/14/11 1109 Last data filed at 09/14/11 1030  Gross per 24 hour  Intake 2796.73 ml  Output   2090 ml  Net 706.73 ml    CVP:  [9 mmHg-12 mmHg] 10 mmHg   Physical Examination: Gen: on vent, comfortable HEENT: NCAT, PERRL, EOMi, OP clear, ETT in place Neck: supple without masses PULM: Diminished on L > R CV: Tachy, no mgr, no JVD AB: BS absent, dressings and drains in place, Ext: warm, trace edema, no clubbing, no cyanosis, scd's in place Derm: no rash or skin breakdown Neuro: on vent, sedated but f/c   Labs     Lab 09/14/11 0620 09/13/11 0345 09/12/11 2230  NA 133* 132* 133*  K 4.0 5.1 5.2*  CL 106 103 102  CO2 20 20 22   BUN 27* 40* 43*  CREATININE  1.93* 2.28* 2.53*  GLUCOSE 171* 80 64*    Lab 09/14/11 0620 09/13/11 0345 09/12/11 2230  HGB 7.7* 10.0* 9.7*  HCT 22.3* 28.7* 29.0*  WBC 8.4 9.2 5.4  PLT 94* 153 121*   Ct Abdomen Pelvis Wo Contrast  09/12/2011  **ADDENDUM** CREATED: 09/12/2011 15:52:02  Critical Value/emergent results were called by telephone at the time of interpretation on 09/12/2011  at 3:50 p.m.  to  Dr. Alto Denver, who verbally acknowledged these results.  **END ADDENDUM** SIGNED BY: Aubery Lapping. Dover, M.D.    09/12/2011  *RADIOLOGY REPORT*  Clinical Data: Upper abdominal pain.  CT ABDOMEN AND PELVIS WITHOUT CONTRAST  Technique:  Multidetector CT imaging of the abdomen and pelvis was performed following the standard protocol without intravenous contrast.  Comparison: Plain films 09/12/2011  Findings: There are locules of extraluminal free air in the upper abdomen adjacent to the liver edge.  Findings compatible with bowel perforation.  The distal stomach appears abnormal with wall thickening and surrounding stranding/inflammation.  This is concerning for peptic ulcer disease and possibly the source of the perforation.  Small amount of free fluid in the pelvis. There is also perihepatic fluid which is nearly isointense to  the liver and difficult to visualize.  This could represent blood or extravasated oral contrast.  No evidence of bowel obstruction.  Scattered colonic diverticula.  No active diverticulitis.  Aorta and iliac vessels are calcified, non-aneurysmal.  There is a small hiatal hernia. No focal lesion in the liver, spleen, pancreas, adrenals or kidneys on this unenhanced study.  Pancreas is atrophic.  Urinary bladder unremarkable.  No acute bony abnormality.  IMPRESSION: Locules of free air and high-density free fluid in the abdomen. Abnormal pyloric region of the stomach and possibly involving the first portion of the duodenum.  Findings concerning for perforated peptic ulcer disease.  Original Report Authenticated By: Cyndie Chime, M.D.   Dg Chest Port 1 View  09/14/2011  *RADIOLOGY REPORT*  Clinical Data: Intubated patient of  PORTABLE CHEST - 1 VIEW  Comparison: 09/13/2011; 09/11/1937; 01/23/2008  Findings:  Grossly unchanged enlarged cardiac silhouette and mediastinal contours with atherosclerotic calcifications of the thoracic aorta. Post median sternotomy and CABG. Endotracheal tube overlies tracheal air column with tips through the carina.  Interval placement of enteric tube with tip overlying the superior/mid esophagus.  Unchanged positioning of right jugular approach central venous catheter with tip overlying the superior cavoatrial junction.  Mild cephalization of flow without frank evidence of pulmonary edema.  There is blunting of bilateral costophrenic angles and grossly unchanged bibasilar heterogeneous / consolidative opacities, left greater than right.  No definite pneumothorax.  Unchanged bones.  IMPRESSION: 1.  Interval placement of enteric tube with tip over the superior/mid esophagus.  Repositioning and repeat radiograph is recommended. 2.  Otherwise, stable position of support apparatus.  No pneumothorax. 3.  Grossly unchanged low lung volumes, small effusions and bibasilar opacities, atelectasis versus infiltrate.  This is made call report.  Original Report Authenticated By: Waynard Reeds, M.D.   Dg Chest Port 1 View  09/13/2011  *RADIOLOGY REPORT*  Clinical Data: Line placement  PORTABLE CHEST - 1 VIEW  Comparison: 09/12/2011  Findings: Stable postoperative changes in the mediastinum. Endotracheal tube with tip about 5.3 cm above the carina.  Interval placement of right central venous catheter with tip over the mid SVC region.  No pneumothorax.  Shallow inspiration with atelectasis in the lung bases.  Mild cardiac enlargement.  Degenerative changes in the spine and shoulders.  Old fracture deformity of the left clavicle.  IMPRESSION: Appliances in satisfactory apparent location.  Shallow inspiration with  basilar atelectasis.  Original Report Authenticated By: Marlon Pel, M.D.   Dg Chest Port 1 View  09/12/2011  *RADIOLOGY REPORT*  Clinical Data: Endotracheal tube placement  PORTABLE CHEST - 1 VIEW  Comparison: 01/23/2008  Findings: Endotracheal tube was placed with tip about 4.5 cm above the carina.  Postoperative changes in the mediastinum with sternotomy wires and surgical clips and vascular markers present. Shallow inspiration.  Linear atelectasis in the lung bases and right midlung.  No apparent pneumothorax.  Calcified and tortuous aorta.  Degenerative changes in the shoulders.  IMPRESSION: Endotracheal tube tip is about 4.5 cm above the carina.  Shallow inspiration with basilar infiltration or atelectasis bilaterally.  Original Report Authenticated By: Marlon Pel, M.D.   Dg Abd Acute W/chest  09/12/2011  *RADIOLOGY REPORT*  Clinical Data: Abdominal pain and distention.  Bilateral lower extremity swelling.  Constipation.  ACUTE ABDOMEN SERIES (ABDOMEN 2 VIEW & CHEST 1 VIEW) 09/12/2011:  Comparison: No prior abdomen x-ray.  Two-view chest x-ray 01/23/2008 [facility].  Findings: Bowel gas pattern unremarkable without evidence of obstruction  or significant ileus.  No evidence of free intraperitoneal air on the erect image.  Air-fluid levels in the stomach and in the colon.  Moderate stool burden involving the descending colon, sigmoid colon, and rectum.  No visible opaque urinary tract calculi.  Phlebolith low in the right side of the pelvis.  Degenerative changes involving the lumbar spine.  Prior sternotomy for CABG.  Cardiac silhouette mildly enlarged but stable.  Bilateral pleural effusions, left greater than right, with associated mild passive atelectasis in the lower lobes.  Lungs otherwise clear.  Pulmonary vascularity normal without evidence of pulmonary edema.  IMPRESSION:  1.  No acute abdominal abnormality.  Moderate stool burden. 2.  Stable cardiomegaly without  pulmonary edema.  Bilateral pleural effusions, left greater than right, and associated passive atelectasis in the lower lobes.  Original Report Authenticated By: Arnell Sieving, M.D.       Assessment and Plan:  Duodenal ulcer     Assessment: s/p open repair 2/13, currently not bleeding, empiric antibiotics ordered   Plan:  -post op care per surgery -monitor h/h -ppi gtt -agree with zosyn, -bc x 2  pending - monitor for sepsis/peritonitis  Acute resp failure     Assessment: came from OR intubated,d/t  pain, hypotension    Plan:  -full vent support - wean to extubate once off pressors  -daily sbt/wua    CHF     Assessment: +balance, CVP ~10    Plan:  -monitor CVP for further resuscitation needs -echo 2/14 >>>see report nl.  CKD   Lab Results  Component Value Date   CREATININE 1.93* 09/14/2011   CREATININE 2.28* 09/13/2011   CREATININE 2.53* 09/12/2011      Assessment: stage III CKD at baseline   Plan:  -monitor UOP -renally dose meds  Shock    Assessment: likely due to volume loss from GIB, and post op sedation, does not appear to be in septic shock   Plan:  -cvp monitoring and vol exp as needed since cxr not wet  -follow  cardiac enzymes  Trop I<.0.30   Diabetes mellitus (09/13/2011)   Assessment:    Plan:  -icu hyperglycemia protocol  Code: Full, need to discuss further with wife when available   Best practices / Disposition:  Feeding/protein malnutrition: npo Analgesia: fentanyl prn Sedation: versed prn Thromboprophylaxis: scd HOB >30 degrees Ulcer prophylaxis: ppi gtt Glucose control/hyperglycemia: icu hyperglyecmia  Brett Canales Minor ACNP [facility] PCCM Pager [phone] till 3 pm If no answer page [phone] 09/14/2011, 11:09 AM   Pt independently  seen and examined and available cxr's reviewed and I agree with above findings/ imp/ plan   Hgb down 2 units with cvp 6 on levophed so needs tx x 2 units and wean off levophed ? Ext 2/16 am  The  patient is critically ill with multiple organ systems failure and requires high complexity decision making for assessment and support, frequent evaluation and titration of therapies, application of advanced monitoring technologies and extensive interpretation of multiple databases. Critical Care Time devoted to patient care services described in this note is 45 minutes.    Sandrea Hughs, MD Pulmonary and Critical Care Medicine [facility] Cell [phone]

## 2011-09-14 NOTE — Progress Notes (Signed)
Met with patient and family. Spouse and daughter at bedside. Spouse feels well supported by [employer] [facility] and [employer] (her church). She is Media planner. Daughter has some nursing training.  Will continue to support.  09/14/11 1300  Clinical Encounter Type  Visited With Patient and family together  Visit Type Spiritual support;Social support  Recommendations Follow up  Spiritual Encounters  Spiritual Needs Emotional;Other (Comment) (Presence and listening)

## 2011-09-15 ENCOUNTER — Inpatient Hospital Stay (HOSPITAL_COMMUNITY): Payer: Medicare Other

## 2011-09-15 LAB — GLUCOSE, CAPILLARY
Glucose-Capillary: 156 mg/dL — ABNORMAL HIGH (ref 70–99)
Glucose-Capillary: 162 mg/dL — ABNORMAL HIGH (ref 70–99)
Glucose-Capillary: 126 mg/dL — ABNORMAL HIGH (ref 70–99)

## 2011-09-15 LAB — TYPE AND SCREEN: Unit division: 0

## 2011-09-15 LAB — BASIC METABOLIC PANEL
BUN: 23 mg/dL (ref 6–23)
Chloride: 109 mEq/L (ref 96–112)
Creatinine, Ser: 1.78 mg/dL — ABNORMAL HIGH (ref 0.50–1.35)
GFR calc Af Amer: 37 mL/min — ABNORMAL LOW (ref >90)
Glucose, Bld: 163 mg/dL — ABNORMAL HIGH (ref 70–99)

## 2011-09-15 LAB — CBC
HCT: 26 % — ABNORMAL LOW (ref 39.0–52.0)
Hemoglobin: 9.2 g/dL — ABNORMAL LOW (ref 13.0–17.0)
MCHC: 35.4 g/dL (ref 30.0–36.0)
MCV: 98.1 fL (ref 78.0–100.0)
RDW: 15.6 % — ABNORMAL HIGH (ref 11.5–15.5)
WBC: 8.5 10*3/uL (ref 4.0–10.5)

## 2011-09-15 MED ORDER — DEXTROSE-NACL 5-0.9 % IV SOLN
INTRAVENOUS | Status: DC
  Administered 2011-09-15 – 2011-09-18 (×4): via INTRAVENOUS

## 2011-09-15 NOTE — Progress Notes (Signed)
Patient ID: [name] [name], male   DOB: 13-May-1920, 78 y.o.   MRN: [id number] [facility] Progress Note:   3 Days Post-Op  Subjective: Mental status is nods head to command but intubated.  Weaning has been in progress over last 2 days.  Volume issues addressed with blood transfusion Objective: Vital signs in last 24 hours: Temp:  [97.5 F (36.4 C)-101 F (38.3 C)] 97.5 F (36.4 C) (02/16 0443) Pulse Rate:  [63-125] 63  (02/16 0443) Resp:  [16-23] 16  (02/16 0443) BP: (67-139)/(36-66) 96/43 mmHg (02/16 0443) SpO2:  [97 %-100 %] 97 % (02/16 0443) FiO2 (%):  [30 %] 30 % (02/16 0600)  Intake/Output from previous day: 02/15 0701 - 02/16 0700 In: 2897.6 [I.V.:2135.1; Blood:650; IV Piggyback:112.5] Out: 2375 [Urine:765; Drains:1610] Intake/Output this shift:    Physical Exam: Work of breathing is  Per ventilator.  Dressing dry.  G tube no blood.  Serous drainage from JP  Lab Results:  Results for orders placed during the hospital encounter of 09/12/11 (from the past 48 hour(s))  CARDIAC PANEL(CRET KIN+CKTOT+MB+TROPI)     Status: Abnormal   Collection Time   09/13/11  8:45 AM      Component Value Range Comment   Total CK 107  7 - 232 (U/L)    CK, MB 3.4  0.3 - 4.0 (ng/mL)    Troponin I <0.30  <0.30 (ng/mL)    Relative Index 3.2 (*) 0.0 - 2.5    GLUCOSE, CAPILLARY     Status: Abnormal   Collection Time   09/13/11 11:42 AM      Component Value Range Comment   Glucose-Capillary 157 (*) 70 - 99 (mg/dL)    Comment 1 Documented in Chart      Comment 2 Notify RN     CARDIAC PANEL(CRET KIN+CKTOT+MB+TROPI)     Status: Abnormal   Collection Time   09/13/11  2:00 PM      Component Value Range Comment   Total CK 119  7 - 232 (U/L)    CK, MB 3.1  0.3 - 4.0 (ng/mL)    Troponin I <0.30  <0.30 (ng/mL)    Relative Index 2.6 (*) 0.0 - 2.5    GLUCOSE, CAPILLARY     Status: Abnormal   Collection Time   09/13/11  3:47 PM      Component Value Range Comment   Glucose-Capillary 150  (*) 70 - 99 (mg/dL)   GLUCOSE, CAPILLARY     Status: Abnormal   Collection Time   09/13/11  8:00 PM      Component Value Range Comment   Glucose-Capillary 150 (*) 70 - 99 (mg/dL)    Comment 1 Notify RN      Comment 2 Documented in Chart     GLUCOSE, CAPILLARY     Status: Abnormal   Collection Time   09/13/11 11:50 PM      Component Value Range Comment   Glucose-Capillary 164 (*) 70 - 99 (mg/dL)   GLUCOSE, CAPILLARY     Status: Abnormal   Collection Time   09/14/11  4:31 AM      Component Value Range Comment   Glucose-Capillary 161 (*) 70 - 99 (mg/dL)   CBC     Status: Abnormal   Collection Time   09/14/11  6:20 AM      Component Value Range Comment   WBC 8.4  4.0 - 10.5 (K/uL)    RBC 2.19 (*) 4.22 - 5.81 (MIL/uL)  Hemoglobin 7.7 (*) 13.0 - 17.0 (g/dL)    HCT 16.1 (*) 09.6 - 52.0 (%)    MCV 101.8 (*) 78.0 - 100.0 (fL)    MCH 35.2 (*) 26.0 - 34.0 (pg)    MCHC 34.5  30.0 - 36.0 (g/dL)    RDW 04.5  40.9 - 81.1 (%)    Platelets 94 (*) 150 - 400 (K/uL)   BASIC METABOLIC PANEL     Status: Abnormal   Collection Time   09/14/11  6:20 AM      Component Value Range Comment   Sodium 133 (*) 135 - 145 (mEq/L)    Potassium 4.0  3.5 - 5.1 (mEq/L)    Chloride 106  96 - 112 (mEq/L)    CO2 20  19 - 32 (mEq/L)    Glucose, Bld 171 (*) 70 - 99 (mg/dL)    BUN 27 (*) 6 - 23 (mg/dL)    Creatinine, Ser 9.14 (*) 0.50 - 1.35 (mg/dL)    Calcium 6.7 (*) 8.4 - 10.5 (mg/dL)    GFR calc non Af Amer 29 (*) >90 (mL/min)    GFR calc Af Amer 34 (*) >90 (mL/min)   GLUCOSE, CAPILLARY     Status: Abnormal   Collection Time   09/14/11  7:39 AM      Component Value Range Comment   Glucose-Capillary 162 (*) 70 - 99 (mg/dL)    Comment 1 Documented in Chart      Comment 2 Notify RN     GLUCOSE, CAPILLARY     Status: Abnormal   Collection Time   09/14/11 11:44 AM      Component Value Range Comment   Glucose-Capillary 138 (*) 70 - 99 (mg/dL)    Comment 1 Documented in Chart      Comment 2 Notify RN       PREPARE RBC (CROSSMATCH)     Status: Normal   Collection Time   09/14/11  3:30 PM      Component Value Range Comment   Order Confirmation ORDER PROCESSED BY BLOOD BANK     GLUCOSE, CAPILLARY     Status: Abnormal   Collection Time   09/14/11  3:52 PM      Component Value Range Comment   Glucose-Capillary 131 (*) 70 - 99 (mg/dL)    Comment 1 Documented in Chart      Comment 2 Notify RN     GLUCOSE, CAPILLARY     Status: Abnormal   Collection Time   09/14/11 11:48 PM      Component Value Range Comment   Glucose-Capillary 149 (*) 70 - 99 (mg/dL)    Comment 1 Notify RN     GLUCOSE, CAPILLARY     Status: Abnormal   Collection Time   09/15/11  4:06 AM      Component Value Range Comment   Glucose-Capillary 156 (*) 70 - 99 (mg/dL)    Comment 1 Notify RN     CBC     Status: Abnormal   Collection Time   09/15/11  5:00 AM      Component Value Range Comment   WBC 8.5  4.0 - 10.5 (K/uL)    RBC 2.65 (*) 4.22 - 5.81 (MIL/uL)    Hemoglobin 9.2 (*) 13.0 - 17.0 (g/dL)    HCT 78.2 (*) 95.6 - 52.0 (%)    MCV 98.1  78.0 - 100.0 (fL)    MCH 34.7 (*) 26.0 - 34.0 (pg)    MCHC 35.4  30.0 - 36.0 (g/dL)    RDW 96.0 (*) 45.4 - 15.5 (%)    Platelets 60 (*) 150 - 400 (K/uL)   BASIC METABOLIC PANEL     Status: Abnormal   Collection Time   09/15/11  5:00 AM      Component Value Range Comment   Sodium 136  135 - 145 (mEq/L)    Potassium 3.7  3.5 - 5.1 (mEq/L)    Chloride 109  96 - 112 (mEq/L)    CO2 20  19 - 32 (mEq/L)    Glucose, Bld 163 (*) 70 - 99 (mg/dL)    BUN 23  6 - 23 (mg/dL)    Creatinine, Ser 0.98 (*) 0.50 - 1.35 (mg/dL)    Calcium 6.9 (*) 8.4 - 10.5 (mg/dL)    GFR calc non Af Amer 32 (*) >90 (mL/min)    GFR calc Af Amer 37 (*) >90 (mL/min)   PRO B NATRIURETIC PEPTIDE     Status: Abnormal   Collection Time   09/15/11  5:00 AM      Component Value Range Comment   Pro B Natriuretic peptide (BNP) 1579.0 (*) 0 - 450 (pg/mL)     Radiology/Results: Dg Chest Port 1 View  09/14/2011   *RADIOLOGY REPORT*  Clinical Data: Intubated patient of  PORTABLE CHEST - 1 VIEW  Comparison: 09/13/2011; 09/11/1937; 01/23/2008  Findings:  Grossly unchanged enlarged cardiac silhouette and mediastinal contours with atherosclerotic calcifications of the thoracic aorta. Post median sternotomy and CABG. Endotracheal tube overlies tracheal air column with tips through the carina.  Interval placement of enteric tube with tip overlying the superior/mid esophagus.  Unchanged positioning of right jugular approach central venous catheter with tip overlying the superior cavoatrial junction.  Mild cephalization of flow without frank evidence of pulmonary edema.  There is blunting of bilateral costophrenic angles and grossly unchanged bibasilar heterogeneous / consolidative opacities, left greater than right.  No definite pneumothorax.  Unchanged bones.  IMPRESSION: 1.  Interval placement of enteric tube with tip over the superior/mid esophagus.  Repositioning and repeat radiograph is recommended. 2.  Otherwise, stable position of support apparatus.  No pneumothorax. 3.  Grossly unchanged low lung volumes, small effusions and bibasilar opacities, atelectasis versus infiltrate.  This is made call report.  Original Report Authenticated By: Waynard Reeds, M.D.    Anti-infectives: Anti-infectives     Start     Dose/Rate Route Frequency Ordered Stop   09/13/11 0800  piperacillin-tazobactam (ZOSYN) IVPB 3.375 g       3.375 g 12.5 mL/hr over 240 Minutes Intravenous Every 8 hours 09/12/11 2221     09/12/11 2300  piperacillin-tazobactam (ZOSYN) IVPB 3.375 g       3.375 g 12.5 mL/hr over 240 Minutes Intravenous NOW 09/12/11 2221 09/13/11 0403   09/12/11 1800   ertapenem (INVANZ) 1 g in sodium chloride 0.9 % 50 mL IVPB  Status:  Discontinued        1 g 100 mL/hr over 30 Minutes Intravenous Every 24 hours 09/12/11 1656 09/12/11 2215          Assessment/Plan: Problem List: Patient Active Problem List  Diagnoses   . UNSPECIFIED ANEMIA  . BLOOD IN STOOL  . Duodenal ulcer  . CHF (congestive heart failure)  . CKD (chronic kidney disease)  . Shock  . Respiratory failure  . Diabetes mellitus    Weaning in progress.  Surgical issues stable at present.  3 Days Post-Op    LOS: 3 days  Matt B. Daphine Deutscher, MD, [facility] [phone] beeper [phone]  09/15/2011 8:02 AM

## 2011-09-15 NOTE — Progress Notes (Signed)
Name: [name] [name] MRN: [id number] DOB: Dec 18, 1919  LOS: 3  PCCM Progress NOTE  Brief patient profile:  78 y/o male with CHF, CAD, DM was admitted to [facility] on 2/13 after he presented with abdominal pain> perf DU > OR for an emergent exlap and repair of his ulcer.  PCCM is consulted for assistance with vent management am 2/14  Lines / Drains: 2/13 ETT > 2/16  Cultures / Sepsis markers:  2/14 MRSA >>> neg  Antibiotics: 2/13 zosyn (?GI sepsis)>>  Tests / Events: 2/13 Ex lap, closure and patch of duodenal ulcer, jp drain x2, G-J tube placement  2/14 2 d Left ventricle: The cavity size was normal. Wall thickness was increased increased in a pattern of mild to moderate LVH. Systolic function was normal. The estimated ejection fraction was in the range of 60% to 65%. Wall motion was normal; there were no regional wall motion abnormalities.   Brief overnight :  No problems, working on weaning/ alert and approp    Vital Signs:   Filed Vitals:   09/15/11 0443 09/15/11 0700 09/15/11 0800 09/15/11 0900  BP: 96/43 124/97 129/58 141/66  Pulse: 63 69 69 69  Temp: 97.5 F (36.4 C)     TempSrc: Oral     Resp: 16 19 20 24   Height:      Weight:      SpO2: 97% 97% 97% 98%    Intake/Output Summary (Last 24 hours) at 09/15/11 0958 Last data filed at 09/15/11 0900  Gross per 24 hour  Intake   2975 ml  Output   2440 ml  Net    535 ml    CVP:  [6 mmHg-17 mmHg] 17 mmHg   Physical Examination: Gen: on vent, comfortable/FC HEENT: NCAT, PERRL, EOMi, OP clear, ETT in place Neck: supple without masses PULM: Diminished on L > R CV: Tachy, no mgr, no JVD AB: BS absent, dressings and drains in place, Ext: warm, trace edema, no clubbing, no cyanosis, scd's in place Derm: no rash or skin breakdown Neuro: on vent, sedated but f/c   Labs     Lab 09/15/11 0500 09/14/11 0620 09/13/11 0345  NA 136 133* 132*  K 3.7 4.0 5.1  CL 109 106 103  CO2 20 20 20   BUN 23 27* 40*  CREATININE  1.78* 1.93* 2.28*  GLUCOSE 163* 171* 80    Lab 09/15/11 0500 09/14/11 0620 09/13/11 0345  HGB 9.2* 7.7* 10.0*  HCT 26.0* 22.3* 28.7*  WBC 8.5 8.4 9.2  PLT 60* 94* 153   Dg Chest Port 1 View  09/15/2011  *RADIOLOGY REPORT*  Clinical Data: Endotracheal tube  PORTABLE CHEST     IMPRESSION: Endotracheal tube tip projects 5.6 cm proximal to the carina.  Status post median sternotomy and CABG.  Bibasilar opacities; atelectasis versus infiltrate.      Assessment and Plan:  Duodenal ulcer    Assessment: s/p open repair 2/13, currently not bleeding, empiric antibiotics ordered   Plan:  -post op care per surgery -monitor h/h -ppi gtt -Zoysyn ? When to d/c > defer to [facility]    Acute resp failure/ vent dep   Assessment: came from OR intubated,d/t  pain, hypotension    Plan:    wean to extubate      CHF     Assessment: +balance    Plan:  -monitor CVP for further resuscitation needs -echo 2/14 >>>see report nl.  CKD   Lab Results  Component Value Date   CREATININE  1.78* 09/15/2011   CREATININE 1.93* 09/14/2011   CREATININE 2.28* 09/13/2011      Assessment: stage III CKD at baseline   Plan:  -monitor UOP -renally dose meds  Shock    Assessment: likely due to volume loss from GIB, and post op sedation, does not appear to be in septic shock   Plan:  -cvp monitoring  -Resolved 2/15 with vol exp/transfusion  Anemia, acute blood loss post op  Lab 09/15/11 0500 09/14/11 0620 09/13/11 0345  HGB 9.2* 7.7* 10.0*   approp response to 2 units 2/15   Diabetes mellitus (09/13/2011)   Assessment:    Plan:  -icu hyperglycemia protocol  Thrombocytopenia Lab Results  Component Value Date   PLT 60* 09/15/2011   PLT 94* 09/14/2011   PLT 153 09/13/2011   ? Post transfusion/ ? Hep > d/c   Best practices / Disposition:  Feeding/protein malnutrition: npo  Thromboprophylaxis: hep sq > pas 2/16 due to low plt HOB >30 degrees Ulcer prophylaxis: ppi gtt Glucose control/hyperglycemia:  icu hyperglyecmia   Try extubation 2/16    Sandrea Hughs, MD Pulmonary and Critical Care Medicine [facility] Cell [phone]

## 2011-09-15 NOTE — Progress Notes (Signed)
ANTIBIOTIC CONSULT NOTE  Pharmacy Consult for Zosyn Indication: Perforated Duodenal Ulcer  Allergies  Allergen Reactions  . Enalapril Other (See Comments)    cough  . Milk-Related Compounds Other (See Comments)    Doesn't remember     Patient Measurements: Height: 6\' 1"  (185.4 cm) Weight: 236 lb 12.4 oz (107.4 kg) IBW/kg (Calculated) : 79.9   Vital Signs: Temp: 97.5 F (36.4 C) (02/16 0443) Temp src: Oral (02/16 0443) BP: 96/43 mmHg (02/16 0443) Pulse Rate: 63  (02/16 0443) Intake/Output from previous day: 02/15 0701 - 02/16 0700 In: 2897.6 [I.V.:2135.1; Blood:650; IV Piggyback:112.5] Out: 2375 [Urine:765; Drains:1610] Intake/Output from this shift:    Labs:  Basename 09/15/11 0500 09/14/11 0620 09/13/11 0345  WBC 8.5 8.4 9.2  HGB 9.2* 7.7* 10.0*  PLT 60* 94* 153  LABCREA -- -- --  CREATININE 1.78* 1.93* 2.28*   Estimated Creatinine Clearance: 36.2 ml/min (by C-G formula based on Cr of 1.78).  Microbiology: Recent Results (from the past 720 hour(s))  MRSA PCR SCREENING     Status: Normal   Collection Time   09/13/11  3:54 AM      Component Value Range Status Comment   MRSA by PCR NEGATIVE  NEGATIVE  Final     Medical History: Past Medical History  Diagnosis Date  . Thyroid disease   . Hypertension   . CHF (congestive heart failure)   . Diabetes mellitus   . Coronary artery disease   . Restless leg syndrome   . Hyperlipemia   . Renal disorder   . Sleep apnea   . Cellulitis and abscess of leg   . Obesity     Medications:  Scheduled:     . antiseptic oral rinse  15 mL Mouth Rinse QID  . chlorhexidine  15 mL Mouth Rinse BID  . heparin  5,000 Units Subcutaneous Q8H  . insulin aspart  0-4 Units Subcutaneous Q4H  . piperacillin-tazobactam (ZOSYN)  IV  3.375 g Intravenous Q8H   Infusions:     . dextrose    . dextrose 5 % and 0.9% NaCl 75 mL/hr at 09/15/11 0600  . norepinephrine (LEVOPHED) Adult infusion 2 mcg/min (09/14/11 1500)  .  pantoprozole (PROTONIX) infusion 8 mg/hr (09/15/11 0600)  . DISCONTD: phenylephrine (NEO-SYNEPHRINE) Adult infusion Stopped (09/13/11 0400)   Assessment: 89YOM s/p ex lap and closer/patch of perforated duodenal ulcer on day #4 Zosyn for empiric coverage.  Patient has been afebrile >24 hrs and WBC WNL.  Estimated Creatinine Clearance: 36.2 ml/min (by C-G formula based on Cr of 1.78).  Goal of Therapy:  Doses adjusted per renal clearance  Plan:  Continue Zosyn 3.375g IV q8h (4 hour infusion time). Follow up LOT.  Clance Boll 09/15/2011,8:28 AM

## 2011-09-15 NOTE — Procedures (Signed)
Extubation Procedure Note  Patient Details:   Name: [name] [name] DOB: 09-23-1919 MRN: [id number]   Airway Documentation:  Airway 8 mm (Active)  Secured at (cm) 23 cm 09/15/2011  8:17 AM  Measured From Lips 09/15/2011  8:17 AM  Secured Location Left 09/15/2011  8:17 AM  Secured By [employer] 09/15/2011  8:17 AM  Tube Holder Repositioned Yes 09/15/2011  8:17 AM  Cuff Pressure (cm H2O) 26 cm H2O 09/15/2011  8:17 AM    Evaluation  O2 sats: 99% on 4lpm [place] Complications: none Patient  tolerated procedure well. BBS clear No  Stridor noted   Higinio Plan 09/15/2011, 10:11 AM

## 2011-09-16 DIAGNOSIS — I509 Heart failure, unspecified: Secondary | ICD-10-CM

## 2011-09-16 DIAGNOSIS — E119 Type 2 diabetes mellitus without complications: Secondary | ICD-10-CM

## 2011-09-16 DIAGNOSIS — J9600 Acute respiratory failure, unspecified whether with hypoxia or hypercapnia: Secondary | ICD-10-CM

## 2011-09-16 LAB — GLUCOSE, CAPILLARY
Glucose-Capillary: 138 mg/dL — ABNORMAL HIGH (ref 70–99)
Glucose-Capillary: 141 mg/dL — ABNORMAL HIGH (ref 70–99)
Glucose-Capillary: 152 mg/dL — ABNORMAL HIGH (ref 70–99)
Glucose-Capillary: 152 mg/dL — ABNORMAL HIGH (ref 70–99)
Glucose-Capillary: 179 mg/dL — ABNORMAL HIGH (ref 70–99)

## 2011-09-16 NOTE — Progress Notes (Signed)
Patient ID: [name] [name], male   DOB: 02/17/1920, 78 y.o.   MRN: [id number] [facility] Progress Note:   4 Days Post-Op  Subjective: Mental status is alert.  Up in chair and appropriate.  Extubated 24 hrs ago Objective: Vital signs in last 24 hours: Temp:  [97.4 F (36.3 C)-98.7 F (37.1 C)] 98.4 F (36.9 C) (02/17 0800) Pulse Rate:  [68-75] 72  (02/17 0800) Resp:  [14-24] 23  (02/17 0800) BP: (102-148)/(48-67) 132/52 mmHg (02/17 0800) SpO2:  [93 %-99 %] 97 % (02/17 0800) Weight:  [227 lb 11.8 oz (103.3 kg)] 227 lb 11.8 oz (103.3 kg) (02/17 0400)  Intake/Output from previous day: 02/16 0701 - 02/17 0700 In: 1937.5 [I.V.:1825; IV Piggyback:112.5] Out: 2405 [Urine:795; Drains:1610] Intake/Output this shift: Total I/O In: 100 [I.V.:100] Out: 375 [Urine:225; Drains:150]  Physical Exam: Work of breathing is  Mildly increased. Incision is fairly clean according to nurse  Lab Results:  Results for orders placed during the hospital encounter of 09/12/11 (from the past 48 hour(s))  GLUCOSE, CAPILLARY     Status: Abnormal   Collection Time   09/14/11 11:44 AM      Component Value Range Comment   Glucose-Capillary 138 (*) 70 - 99 (mg/dL)    Comment 1 Documented in Chart      Comment 2 Notify RN     PREPARE RBC (CROSSMATCH)     Status: Normal   Collection Time   09/14/11  3:30 PM      Component Value Range Comment   Order Confirmation ORDER PROCESSED BY BLOOD BANK     GLUCOSE, CAPILLARY     Status: Abnormal   Collection Time   09/14/11  3:52 PM      Component Value Range Comment   Glucose-Capillary 131 (*) 70 - 99 (mg/dL)    Comment 1 Documented in Chart      Comment 2 Notify RN     GLUCOSE, CAPILLARY     Status: Abnormal   Collection Time   09/14/11  7:58 PM      Component Value Range Comment   Glucose-Capillary 136 (*) 70 - 99 (mg/dL)    Comment 1 Documented in Chart      Comment 2 Notify RN     GLUCOSE, CAPILLARY     Status: Abnormal   Collection Time   09/14/11 11:48 PM      Component Value Range Comment   Glucose-Capillary 149 (*) 70 - 99 (mg/dL)    Comment 1 Notify RN     GLUCOSE, CAPILLARY     Status: Abnormal   Collection Time   09/15/11  4:06 AM      Component Value Range Comment   Glucose-Capillary 156 (*) 70 - 99 (mg/dL)    Comment 1 Notify RN     CBC     Status: Abnormal   Collection Time   09/15/11  5:00 AM      Component Value Range Comment   WBC 8.5  4.0 - 10.5 (K/uL)    RBC 2.65 (*) 4.22 - 5.81 (MIL/uL)    Hemoglobin 9.2 (*) 13.0 - 17.0 (g/dL)    HCT 40.9 (*) 81.1 - 52.0 (%)    MCV 98.1  78.0 - 100.0 (fL)    MCH 34.7 (*) 26.0 - 34.0 (pg)    MCHC 35.4  30.0 - 36.0 (g/dL)    RDW 91.4 (*) 78.2 - 15.5 (%)    Platelets 60 (*) 150 - 400 (K/uL)  BASIC METABOLIC PANEL     Status: Abnormal   Collection Time   09/15/11  5:00 AM      Component Value Range Comment   Sodium 136  135 - 145 (mEq/L)    Potassium 3.7  3.5 - 5.1 (mEq/L)    Chloride 109  96 - 112 (mEq/L)    CO2 20  19 - 32 (mEq/L)    Glucose, Bld 163 (*) 70 - 99 (mg/dL)    BUN 23  6 - 23 (mg/dL)    Creatinine, Ser 1.19 (*) 0.50 - 1.35 (mg/dL)    Calcium 6.9 (*) 8.4 - 10.5 (mg/dL)    GFR calc non Af Amer 32 (*) >90 (mL/min)    GFR calc Af Amer 37 (*) >90 (mL/min)   PRO B NATRIURETIC PEPTIDE     Status: Abnormal   Collection Time   09/15/11  5:00 AM      Component Value Range Comment   Pro B Natriuretic peptide (BNP) 1579.0 (*) 0 - 450 (pg/mL)   GLUCOSE, CAPILLARY     Status: Abnormal   Collection Time   09/15/11  8:02 AM      Component Value Range Comment   Glucose-Capillary 162 (*) 70 - 99 (mg/dL)    Comment 1 Notify RN      Comment 2 Documented in Chart     GLUCOSE, CAPILLARY     Status: Abnormal   Collection Time   09/15/11 12:24 PM      Component Value Range Comment   Glucose-Capillary 132 (*) 70 - 99 (mg/dL)   GLUCOSE, CAPILLARY     Status: Abnormal   Collection Time   09/15/11  4:32 PM      Component Value Range Comment   Glucose-Capillary 106 (*)  70 - 99 (mg/dL)    Comment 1 Notify RN      Comment 2 Documented in Chart     GLUCOSE, CAPILLARY     Status: Abnormal   Collection Time   09/15/11  7:36 PM      Component Value Range Comment   Glucose-Capillary 126 (*) 70 - 99 (mg/dL)    Comment 1 Documented in Chart      Comment 2 Notify RN     GLUCOSE, CAPILLARY     Status: Abnormal   Collection Time   09/16/11 12:04 AM      Component Value Range Comment   Glucose-Capillary 141 (*) 70 - 99 (mg/dL)    Comment 1 Documented in Chart      Comment 2 Notify RN     GLUCOSE, CAPILLARY     Status: Abnormal   Collection Time   09/16/11  4:14 AM      Component Value Range Comment   Glucose-Capillary 152 (*) 70 - 99 (mg/dL)    Comment 1 Documented in Chart      Comment 2 Notify RN     GLUCOSE, CAPILLARY     Status: Abnormal   Collection Time   09/16/11  7:52 AM      Component Value Range Comment   Glucose-Capillary 153 (*) 70 - 99 (mg/dL)    Comment 1 Notify RN      Comment 2 Documented in Chart       Radiology/Results: Dg Chest Port 1 View  09/15/2011  *RADIOLOGY REPORT*  Clinical Data: Endotracheal tube  PORTABLE CHEST - 1 VIEW  Comparison: 09/14/2011  Findings: Hypoaerated, results in interstitial and vascular crowding.  Right IJ catheter tip projects over  the distal SVC. Endotracheal tube tip projects 5.6 cm proximal to the carina. Right greater than left lung base opacities.  No pneumothorax. Status post median sternotomy and CABG.  No acute osseous abnormality.  IMPRESSION: Endotracheal tube tip projects 5.6 cm proximal to the carina.  Status post median sternotomy and CABG.  Bibasilar opacities; atelectasis versus infiltrate.  Original Report Authenticated By: Waneta Martins, M.D.    Anti-infectives: Anti-infectives     Start     Dose/Rate Route Frequency Ordered Stop   09/13/11 0800  piperacillin-tazobactam (ZOSYN) IVPB 3.375 g       3.375 g 12.5 mL/hr over 240 Minutes Intravenous Every 8 hours 09/12/11 2221     09/12/11  2300  piperacillin-tazobactam (ZOSYN) IVPB 3.375 g       3.375 g 12.5 mL/hr over 240 Minutes Intravenous NOW 09/12/11 2221 09/13/11 0403   09/12/11 1800   ertapenem (INVANZ) 1 g in sodium chloride 0.9 % 50 mL IVPB  Status:  Discontinued        1 g 100 mL/hr over 30 Minutes Intravenous Every 24 hours 09/12/11 1656 09/12/11 2215          Assessment/Plan: Problem List: Patient Active Problem List  Diagnoses  . UNSPECIFIED ANEMIA  . BLOOD IN STOOL  . Duodenal ulcer  . CHF (congestive heart failure)  . CKD (chronic kidney disease)  . Shock  . Respiratory failure  . Diabetes mellitus    G tube with green drainage.  JPs with serous drainage.  Extubated.  Will observe and probably get gastrograffin study before feeding.  4 Days Post-Op    LOS: 4 days   Matt B. Daphine Deutscher, MD, [facility] [phone] beeper [phone]  09/16/2011 10:12 AM

## 2011-09-16 NOTE — Plan of Care (Signed)
Problem: Phase II Progression Outcomes Goal: Date pt extubated/weaned off vent Outcome: Completed/Met Date Met:  09/16/11 09/15/11 Goal: Time pt extubated/weaned off vent Outcome: Completed/Met Date Met:  09/16/11 10:00am

## 2011-09-16 NOTE — Progress Notes (Signed)
Name: [name] [name] MRN: [id number] DOB: 22-Dec-1919  LOS: 4  PCCM Progress NOTE  Brief patient profile:  78 y/o male with CHF, CAD, DM was admitted to [facility] on 2/13 after he presented with abdominal pain> perf DU > OR for an emergent exlap and repair of his ulcer.  PCCM is consulted for assistance with vent management am 2/14  Lines / Drains: 2/13 ETT > 2/16  Cultures / Sepsis markers:  2/14 MRSA >>> neg  Antibiotics: 2/13 zosyn (?GI sepsis)>>  Tests / Events: 2/13 Ex lap, closure and patch of duodenal ulcer, jp drain x2, G-J tube placement  2/14 2 d Left ventricle: The cavity size was normal. Wall thickness was increased increased in a pattern of mild to moderate LVH. Systolic function was normal. The estimated ejection fraction was in the range of 60% to 65%. Wall motion was normal; there were no regional wall motion abnormalities.   Brief overnight :  No problems, sitting in chair, reports never smoker or resp issues preop    Vital Signs:   Filed Vitals:   09/16/11 0800 09/16/11 0900 09/16/11 1000 09/16/11 1100  BP: 132/52 126/58 129/62 127/59  Pulse: 72 68 55 65  Temp: 98.4 F (36.9 C)     TempSrc: Oral     Resp: 23 21 21 19   Height:      Weight:      SpO2: 97% 98% 96% 98%    Intake/Output Summary (Last 24 hours) at 09/16/11 1123 Last data filed at 09/16/11 1100  Gross per 24 hour  Intake 2012.5 ml  Output   2375 ml  Net -362.5 ml        Physical Examination: Gen: on 2lpm sats 98% HEENT:orophax clear Neck: supple without masses/ no stridor PULM: Diminished on L > R CV: Tachy, no mgr, no JVD AB: BS absent, dressings and drains in place, Ext: warm, trace edema, no clubbing, no cyanosis, scd's in place Derm: no rash or skin breakdown    Labs     Lab 09/15/11 0500 09/14/11 0620 09/13/11 0345  NA 136 133* 132*  K 3.7 4.0 5.1  CL 109 106 103  CO2 20 20 20   BUN 23 27* 40*  CREATININE 1.78* 1.93* 2.28*  GLUCOSE 163* 171* 80    Lab 09/15/11  0500 09/14/11 0620 09/13/11 0345  HGB 9.2* 7.7* 10.0*  HCT 26.0* 22.3* 28.7*  WBC 8.5 8.4 9.2  PLT 60* 94* 153   Dg Chest Port 1 View  09/15/2011  *RADIOLOGY REPORT*  Clinical Data: Endotracheal tube  PORTABLE CHEST     IMPRESSION: Endotracheal tube tip projects 5.6 cm proximal to the carina.  Status post median sternotomy and CABG.  Bibasilar opacities; atelectasis versus infiltrate.      Assessment and Plan:  Duodenal ulcer    Assessment: s/p open repair 2/13, currently not bleeding, empiric antibiotics ordered   Plan:  -post op care per surgery -monitor h/h -ppi gtt -Zoysyn ? When to d/c > defer to CCS    Acute resp failure/ vent dep   Assessment: came from OR intubated,d/t  pain, hypotension successfully extubated 2/16       CHF     Assessment: +balance    Plan:  -monitor CVP for further resuscitation needs -echo 2/14 >>>see report= nl.  CKD   Lab Results  Component Value Date   CREATININE 1.78* 09/15/2011   CREATININE 1.93* 09/14/2011   CREATININE 2.28* 09/13/2011      Assessment: stage III CKD  at baseline> improving   Plan:   -renally dose meds  Shock    Assessment: likely due to volume loss from GIB, and post op sedation, does not appear to be in septic shock   Plan:  -cvp monitoring  -Resolved 2/15 with vol exp/transfusion  Anemia, acute blood loss post op  Lab 09/15/11 0500 09/14/11 0620 09/13/11 0345  HGB 9.2* 7.7* 10.0*   approp response to 2 units 2/15   Diabetes mellitus (09/13/2011)   Assessment:    Plan:  -icu hyperglycemia protocol  Thrombocytopenia Lab Results  Component Value Date   PLT 60* 09/15/2011   PLT 94* 09/14/2011   PLT 153 09/13/2011   ? Post transfusion/ ? Hep > d/c   Best practices / Disposition:     Thromboprophylaxis: hep sq > pas 2/16 due to low plt HOB >30 degrees Ulcer prophylaxis: ppi gtt Glucose control/hyperglycemia: icu hyperglyecmia     Sandrea Hughs, MD Pulmonary and Critical Care Medicine [facility]  Healthcare Cell [phone]

## 2011-09-17 ENCOUNTER — Inpatient Hospital Stay (HOSPITAL_COMMUNITY): Payer: Medicare Other

## 2011-09-17 LAB — GLUCOSE, CAPILLARY
Glucose-Capillary: 152 mg/dL — ABNORMAL HIGH (ref 70–99)
Glucose-Capillary: 163 mg/dL — ABNORMAL HIGH (ref 70–99)

## 2011-09-17 LAB — CBC
MCH: 34 pg (ref 26.0–34.0)
MCHC: 34.3 g/dL (ref 30.0–36.0)
Platelets: 69 10*3/uL — ABNORMAL LOW (ref 150–400)
RBC: 2.91 MIL/uL — ABNORMAL LOW (ref 4.22–5.81)
RDW: 14.9 % (ref 11.5–15.5)

## 2011-09-17 LAB — BASIC METABOLIC PANEL
Calcium: 7.8 mg/dL — ABNORMAL LOW (ref 8.4–10.5)
GFR calc non Af Amer: 39 mL/min — ABNORMAL LOW (ref >90)
Glucose, Bld: 144 mg/dL — ABNORMAL HIGH (ref 70–99)
Sodium: 144 mEq/L (ref 135–145)

## 2011-09-17 MED ORDER — INSULIN ASPART 100 UNIT/ML ~~LOC~~ SOLN
0.0000 [IU] | Freq: Three times a day (TID) | SUBCUTANEOUS | Status: DC
  Administered 2011-09-18 (×2): 2 [IU] via SUBCUTANEOUS
  Administered 2011-09-18: 1 [IU] via SUBCUTANEOUS
  Administered 2011-09-19: 2 [IU] via SUBCUTANEOUS
  Administered 2011-09-19: 3 [IU] via SUBCUTANEOUS
  Administered 2011-09-19: 1 [IU] via SUBCUTANEOUS
  Administered 2011-09-20: 3 [IU] via SUBCUTANEOUS
  Administered 2011-09-20: 2 [IU] via SUBCUTANEOUS
  Administered 2011-09-20: 5 [IU] via SUBCUTANEOUS
  Administered 2011-09-21 (×2): 3 [IU] via SUBCUTANEOUS
  Administered 2011-09-21 – 2011-09-22 (×2): 5 [IU] via SUBCUTANEOUS
  Administered 2011-09-22 (×2): 2 [IU] via SUBCUTANEOUS
  Administered 2011-09-23: 5 [IU] via SUBCUTANEOUS
  Administered 2011-09-23: 3 [IU] via SUBCUTANEOUS
  Administered 2011-09-23: 5 [IU] via SUBCUTANEOUS
  Administered 2011-09-24 (×2): 3 [IU] via SUBCUTANEOUS

## 2011-09-17 MED ORDER — POTASSIUM CHLORIDE 10 MEQ/100ML IV SOLN
10.0000 meq | INTRAVENOUS | Status: AC
  Administered 2011-09-17 (×2): 10 meq via INTRAVENOUS
  Filled 2011-09-17 (×2): qty 100

## 2011-09-17 NOTE — Progress Notes (Signed)
Dr. Carolynne Edouard notified of patient , who was recently extubated  is wheezing occasionally,respiratory evaluated patient ; she think it is extubation stridor . Also MD notified of patient having 3 beat run of vtach. Orders received and initiated.

## 2011-09-17 NOTE — Progress Notes (Addendum)
5 Days Post-Op  Subjective: No real complaints this am  Objective: Vital signs in last 24 hours: Temp:  [97.5 F (36.4 C)-98.4 F (36.9 C)] 97.7 F (36.5 C) (02/18 0400) Pulse Rate:  [52-84] 70  (02/18 0600) Resp:  [16-25] 23  (02/18 0600) BP: (117-154)/(46-98) 154/58 mmHg (02/18 0600) SpO2:  [90 %-99 %] 93 % (02/18 0600) Last BM Date:  (prior to admission)  Intake/Output from previous day: 02/17 0701 - 02/18 0700 In: 2550 [I.V.:2400; IV Piggyback:150] Out: 2570 [Urine:1005; Drains:1565] Intake/Output this shift:    General appearance: no distress Resp: diminished breath sounds bibasilar Cardio: RRR GI: some bs, wound clean without infection, g draining bilious fluid, jps with serous fluid  Lab Results:   Basename 09/17/11 0457 09/15/11 0500  WBC 7.4 8.5  HGB 9.9* 9.2*  HCT 28.9* 26.0*  PLT 69* 60*   BMET  Basename 09/17/11 0457 09/15/11 0500  NA 144 136  K 3.4* 3.7  CL 115* 109  CO2 22 20  GLUCOSE 144* 163*  BUN 18 23  CREATININE 1.52* 1.78*  CALCIUM 7.8* 6.9*   Assessment/Plan: POD 5 graham patch perf du, gj tube placement 1. Cont iv pain control 2. Pulm toilet, appreciate ccm/pulm assistance 3. GI_- clamp g tube today and follow, if tolerates and drain output ok will get ugi tomorrow to assess for po intake 4. ID- cont abx for 7 days postop due to contamination 5. Can dc foley unless ccm would like in 6. Recheck labs in am 7. PT   LOS: 5 days    [facility] 09/17/2011

## 2011-09-17 NOTE — Progress Notes (Signed)
Patient received from ICU , alert and oriented, somewhat forgetful. Alert and oriented, 2 JP drain on  Right intact draining with serousanguenous fluid in small amount dressing dray and intact, Left  Gastric tube clamp with greenish output visible in the tube.Midline incision dressing dry and intact.

## 2011-09-17 NOTE — Progress Notes (Signed)
LOS: 5  PCCM PROGRESS NOTE  Brief patient profile:  78 y/o male with CHF, CAD, DM was admitted to [facility] on 2/13 after he presented with abdominal pain> perf DU > OR for an emergent exlap and repair of his ulcer.  PCCM is consulted for assistance with vent management am 2/14  Lines / Drains: 2/13 ETT > 2/16  Cultures / Sepsis markers:  2/14 MRSA >>> neg  Antibiotics: 2/13 zosyn (?GI sepsis)>>  Tests / Events: 2/13 Ex lap, closure and patch of duodenal ulcer, jp drain x2, G-J tube placement  2/14 2 d Left ventricle: The cavity size was normal. Wall thickness was increased increased in a pattern of mild to moderate LVH. Systolic function was normal. The estimated ejection fraction was in the range of 60% to 65%. Wall motion was normal; there were no regional wall motion abnormalities.   Brief overnight :  NAD at rest on 2lpm np    Vital Signs:   Filed Vitals:   09/17/11 0400 09/17/11 0600 09/17/11 0800 09/17/11 0900  BP: 154/61 154/58 149/69   Pulse: 84 70 81 62  Temp: 97.7 F (36.5 C)  97.5 F (36.4 C)   TempSrc: Oral  Oral   Resp: 25 23    Height:      Weight:      SpO2: 90% 93% 93% 98%    Intake/Output Summary (Last 24 hours) at 09/17/11 1056 Last data filed at 09/17/11 1000  Gross per 24 hour  Intake 2562.5 ml  Output   2240 ml  Net  322.5 ml            Physical Examination: Gen: on 2lpm sats 98% HEENT:orophax clear Neck: supple without masses/ no stridor PULM: Diminished on L > R CV: Tachy, no mgr, no JVD AB: BS absent, dressings and drains in place, Ext: warm, trace edema, no clubbing, no cyanosis, scd's in place Derm: no rash or skin breakdown    Labs     Lab 09/17/11 0457 09/15/11 0500 09/14/11 0620  NA 144 136 133*  K 3.4* 3.7 4.0  CL 115* 109 106  CO2 22 20 20   BUN 18 23 27*  CREATININE 1.52* 1.78* 1.93*  GLUCOSE 144* 163* 171*    Lab 09/17/11 0457 09/15/11 0500 09/14/11 0620  HGB 9.9* 9.2* 7.7*  HCT 28.9* 26.0* 22.3*  WBC 7.4  8.5 8.4  PLT 69* 60* 94*   Dg Chest Port 1 View  Dg Chest Port 1 View  09/17/2011  *RADIOLOGY REPORT*  Clinical Data: Atelectasis.  PORTABLE CHEST - 1 VIEW  Comparison: 09/15/2011.  Findings: Right IJ central line projects in the SVC.  The patient has been extubated in the interval.  Trachea is midline.  Heart size stable.  There are bilateral pleural effusions with mixed interstitial and airspace disease, basilar dependent.  IMPRESSION: Probable congestive heart failure, stable.  Original Report Authenticated By: Reyes Ivan, M.D.     Assessment and Plan:  Duodenal ulcer    Assessment: s/p open repair 2/13, currently not bleeding, empiric antibiotics ordered   Plan:  -post op care per surgery -monitor h/h -ppi gtt -Zoysyn ? When to d/c > defer to CCS    Acute resp failure/ vent dep   Assessment: came from OR intubated,d/t  pain, hypotension successfully extubated 2/16 2/18 No resp distress/ sats ok on 2lpm       CHF     Assessment: +balance    Plan:  -monitor CVP for further resuscitation  needs -echo 2/14 >>>see report= nl.  CKD   Lab Results  Component Value Date   CREATININE 1.52* 09/17/2011   CREATININE 1.78* 09/15/2011   CREATININE 1.93* 09/14/2011      Assessment: stage III CKD at baseline> improving   Plan:   -renally dose meds  Shock    Assessment: likely due to volume loss from GIB, and post op sedation, does not appear to be in septic shock   Plan:   -Resolved 2/15 with vol exp/transfusion  Anemia, acute blood loss post op  Lab 09/17/11 0457 09/15/11 0500 09/14/11 0620  HGB 9.9* 9.2* 7.7*   approp response to 2 units 2/15 and drifting up since   Diabetes mellitus (09/13/2011)   Assessment:    Plan:  -icu hyperglycemia protocol  Thrombocytopenia Lab Results  Component Value Date   PLT 69* 09/17/2011   PLT 60* 09/15/2011   PLT 94* 09/14/2011   ? Post transfusion/ ? Hep > d/c 2/16   Best practices / Disposition:     Thromboprophylaxis: hep  sq > pas 2/16 due to low plt HOB >30 degrees Ulcer prophylaxis: ppi gtt Glucose control/hyperglycemia: icu hyperglyecmia    Brett Canales Minor ACNP [facility] PCCM Pager [phone] till 3 pm If no answer page [phone] 09/17/2011, 10:56 AM   Pt independently  seen and examined and available cxr's reviewed and I agree with above findings/ imp/ plan   Sandrea Hughs, MD Pulmonary and Critical Care Medicine [facility] Healthcare Cell [phone]

## 2011-09-17 NOTE — Progress Notes (Signed)
INITIAL ADULT NUTRITION ASSESSMENT Date: 09/17/2011   Time: 11:07 AM Reason for Assessment: consult  ASSESSMENT: Male 78 y.o.  Dx: Duodenal ulcer  Hx:  Past Medical History  Diagnosis Date  . Thyroid disease   . Hypertension   . CHF (congestive heart failure)   . Diabetes mellitus   . Coronary artery disease   . Restless leg syndrome   . Hyperlipemia   . Renal disorder   . Sleep apnea   . Cellulitis and abscess of leg   . Obesity    Past Surgical History  Procedure Date  . Coronary artery bypass graft     Related Meds:  Scheduled Meds:   . antiseptic oral rinse  15 mL Mouth Rinse QID  . chlorhexidine  15 mL Mouth Rinse BID  . insulin aspart  0-4 Units Subcutaneous Q4H  . piperacillin-tazobactam (ZOSYN)  IV  3.375 g Intravenous Q8H  . potassium chloride  10 mEq Intravenous Q1 Hr x 2   Continuous Infusions:   . dextrose    . dextrose 5 % and 0.9% NaCl 75 mL/hr at 09/16/11 0400  . pantoprozole (PROTONIX) infusion 8 mg/hr (09/17/11 1000)   PRN Meds:.DISCONTD: fentaNYL, DISCONTD: midazolam, DISCONTD: promethazine   Ht: 6\' 1"  (185.4 cm)  Wt: 227 lb 11.8 oz (103.3 kg)  Ideal Wt: 82.4kg % Ideal Wt: 125%  Usual Wt: same % Usual Wt: 100%  Body mass index is 30.05 kg/(m^2).  Food/Nutrition Related Hx:  Decreased intake PTA due to abdominal pain.  Labs:  CMP     Component Value Date/Time   NA 144 09/17/2011 0457   K 3.4* 09/17/2011 0457   CL 115* 09/17/2011 0457   CO2 22 09/17/2011 0457   GLUCOSE 144* 09/17/2011 0457   BUN 18 09/17/2011 0457   CREATININE 1.52* 09/17/2011 0457   CALCIUM 7.8* 09/17/2011 0457   PROT 5.4* 09/13/2011 0345   ALBUMIN 2.2* 09/13/2011 0345   AST 24 09/13/2011 0345   ALT 15 09/13/2011 0345   ALKPHOS 53 09/13/2011 0345   BILITOT 0.3 09/13/2011 0345   GFRNONAA 39* 09/17/2011 0457   GFRAA 45* 09/17/2011 0457    Intake: NPO Output:   Intake/Output Summary (Last 24 hours) at 09/17/11 1131 Last data filed at 09/17/11 1000  Gross per 24  hour  Intake   2450 ml  Output   2240 ml  Net    210 ml   >1.5 L output from G-tube. >100 mL output from JP drains. No BMs. No flatus.  Hypoactive bowel sounds.   Diet Order: NPO  Supplements/Tube Feeding:  None at this time  IVF:    dextrose   dextrose 5 % and 0.9% NaCl Last Rate: 75 mL/hr at 09/16/11 0400  pantoprozole (PROTONIX) infusion Last Rate: 8 mg/hr (09/17/11 1000)    Estimated Nutritional Needs:   Kcal: 2020-2220 kcal Protein: 88-105g Fluid: >2.0 L/day   Pt admitted with abdominal pain and distention, found to have duodenal ulcer.  Pt s/p ex-lap for repair of ulcer (2/13).  Pt with prolonged intubated post-op, extubation (2/17).  At time of surgery a J-tube was placed, however pt has remained NPO for bowel rest per surgery.  Pt has been NPO 5-7 days. A gastrografin study was suggested prior to feeding (2/17).  No results found.  TFs were initially planned to start via J-tube, however, canceled prior to starting (2/18).  Pt symptoms PTA were sudden and progressively worsened.  Pt has had early satiety- h/o constipation.  Pt had abdominal  pain for approximately 1 week, with decreased PO shortly before admission. Wt is stable.  G-tube to be clamped today.   NUTRITION DIAGNOSIS: -Inadequate oral intake (NI-2.1).  Status: Ongoing  RELATED TO: bowel rest  AS EVIDENCE BY: pt NPO due to duodenal ulcer  MONITORING/EVALUATION(Goals): 1.  Food/Beverage; diet advancement per MD. 2.  Nutrition support; if warranted.  EDUCATION NEEDS: -Education needs addressed  INTERVENTION: Appropriate nutrition intervention unclear at this time. Pt sleeping at time of visit.  Discussed with RN who states that per pt/family reports pt was eating a Regular diet with tolerance prior to symptom onset.  Pt weight has been stable, no recent changes.  S/p surgery and several days of bowel rest, expect pt diet and intake may be slow to progress given h/o of early satiety.   1.  Food/Beverage;  pt eating a regular diet PTA. Goal would be to return/progress him to this diet once able and assess adequacy. 2.  Enteral nutrition; if warranted recommend Osmolite 1.2 @ 20 mL/hr.  A full op note is not available for review as to reasons for J-tube placement and plans for use.  Question whether early satiety PTA could be effectively managed with bowel regimen to prevent constipation.  Use of supplemental bolus feeds via J-tube is always an option once PO adequacy is assessed.  Per ASPEN/JPEN, lack of postoperative bowel sounds and flatus are indicative of contractility, but not mucosal integrity, barrier function, and absorptive capacity.  Dietitian #: [phone]  DOCUMENTATION CODES Per approved criteria  -Obesity Unspecified    Loyce Dys [facility] 09/17/2011, 11:07 AM

## 2011-09-17 NOTE — Progress Notes (Signed)
CSW following to assist with d/c planning. Met with pt / spouse this afternoon to offer support. Pt plans to have rehab at [facility] when stable for d/c. [facility] contacted and update provided. Will follow.

## 2011-09-18 ENCOUNTER — Inpatient Hospital Stay (HOSPITAL_COMMUNITY): Payer: Medicare Other

## 2011-09-18 DIAGNOSIS — D696 Thrombocytopenia, unspecified: Secondary | ICD-10-CM

## 2011-09-18 DIAGNOSIS — R1310 Dysphagia, unspecified: Secondary | ICD-10-CM

## 2011-09-18 DIAGNOSIS — K260 Acute duodenal ulcer with hemorrhage: Secondary | ICD-10-CM

## 2011-09-18 DIAGNOSIS — R0602 Shortness of breath: Secondary | ICD-10-CM

## 2011-09-18 DIAGNOSIS — J9600 Acute respiratory failure, unspecified whether with hypoxia or hypercapnia: Secondary | ICD-10-CM

## 2011-09-18 LAB — CBC
MCH: 34 pg (ref 26.0–34.0)
MCHC: 34.3 g/dL (ref 30.0–36.0)
Platelets: 66 10*3/uL — ABNORMAL LOW (ref 150–400)
RDW: 14.8 % (ref 11.5–15.5)

## 2011-09-18 LAB — BASIC METABOLIC PANEL
Calcium: 7.8 mg/dL — ABNORMAL LOW (ref 8.4–10.5)
Creatinine, Ser: 1.34 mg/dL (ref 0.50–1.35)
GFR calc non Af Amer: 45 mL/min — ABNORMAL LOW (ref >90)
Sodium: 144 mEq/L (ref 135–145)

## 2011-09-18 LAB — GLUCOSE, CAPILLARY: Glucose-Capillary: 165 mg/dL — ABNORMAL HIGH (ref 70–99)

## 2011-09-18 LAB — PHOSPHORUS: Phosphorus: 1.9 mg/dL — ABNORMAL LOW (ref 2.3–4.6)

## 2011-09-18 LAB — MAGNESIUM: Magnesium: 2.2 mg/dL (ref 1.5–2.5)

## 2011-09-18 MED ORDER — FUROSEMIDE 10 MG/ML IJ SOLN
40.0000 mg | Freq: Once | INTRAMUSCULAR | Status: AC
  Administered 2011-09-18: 40 mg via INTRAVENOUS
  Filled 2011-09-18: qty 4

## 2011-09-18 MED ORDER — PANTOPRAZOLE SODIUM 40 MG IV SOLR
40.0000 mg | Freq: Two times a day (BID) | INTRAVENOUS | Status: DC
  Administered 2011-09-18 – 2011-09-20 (×5): 40 mg via INTRAVENOUS
  Filled 2011-09-18 (×6): qty 40

## 2011-09-18 MED ORDER — POTASSIUM CHLORIDE 10 MEQ/100ML IV SOLN
10.0000 meq | INTRAVENOUS | Status: AC
  Administered 2011-09-18 (×4): 10 meq via INTRAVENOUS
  Filled 2011-09-18 (×4): qty 100

## 2011-09-18 MED ORDER — KCL IN DEXTROSE-NACL 20-5-0.45 MEQ/L-%-% IV SOLN
INTRAVENOUS | Status: DC
  Administered 2011-09-18: 10:00:00 via INTRAVENOUS
  Filled 2011-09-18 (×2): qty 1000

## 2011-09-18 MED ORDER — ENOXAPARIN SODIUM 40 MG/0.4ML ~~LOC~~ SOLN
40.0000 mg | SUBCUTANEOUS | Status: DC
  Filled 2011-09-18: qty 0.4

## 2011-09-18 MED ORDER — SODIUM CHLORIDE 0.9 % IJ SOLN
10.0000 mL | INTRAMUSCULAR | Status: DC | PRN
  Administered 2011-09-18 – 2011-09-21 (×8): 10 mL
  Administered 2011-09-22 – 2011-09-23 (×3): 20 mL
  Administered 2011-09-24 (×2): 10 mL

## 2011-09-18 MED ORDER — POTASSIUM PHOSPHATE DIBASIC 3 MMOLE/ML IV SOLN
20.0000 mmol | Freq: Once | INTRAVENOUS | Status: AC
  Administered 2011-09-18: 20 mmol via INTRAVENOUS
  Filled 2011-09-18: qty 6.67

## 2011-09-18 MED ORDER — DEXTROSE-NACL 5-0.45 % IV SOLN
INTRAVENOUS | Status: DC
  Administered 2011-09-18: 20 mL/h via INTRAVENOUS

## 2011-09-18 MED ORDER — POTASSIUM PHOSPHATE DIBASIC 3 MMOLE/ML IV SOLN
20.0000 mmol | Freq: Once | INTRAVENOUS | Status: DC
  Filled 2011-09-18: qty 6.67

## 2011-09-18 NOTE — Progress Notes (Signed)
ANTIBIOTIC CONSULT NOTE  Pharmacy Consult for Zosyn Indication: Perforated Duodenal Ulcer  Allergies  Allergen Reactions  . Enalapril Other (See Comments)    cough  . Milk-Related Compounds Other (See Comments)    Doesn't remember     Patient Measurements: Height: 6\' 1"  (185.4 cm) Weight: 227 lb 11.8 oz (103.3 kg) IBW/kg (Calculated) : 79.9   Vital Signs: Temp: 98.3 F (36.8 C) (02/19 0700) Temp src: Oral (02/19 0700) BP: 156/77 mmHg (02/19 0700) Pulse Rate: 77  (02/19 0700) Intake/Output from previous day: 02/18 0701 - 02/19 0700 In: 2250 [I.V.:2100; IV Piggyback:150] Out: 1300 [Urine:775; Drains:525] Intake/Output from this shift: Total I/O In: -  Out: 300 [Urine:300]  Labs:  [facility] 09/18/11 0405 09/17/11 0457  WBC 6.6 7.4  HGB 9.7* 9.9*  PLT 66* 69*  LABCREA -- --  CREATININE 1.34 1.52*   Estimated Creatinine Clearance: 47.2 ml/min (by C-G formula based on Cr of 1.34).  Microbiology: Recent Results (from the past 720 hour(s))  MRSA PCR SCREENING     Status: Normal   Collection Time   09/13/11  3:54 AM      Component Value Range Status Comment   MRSA by PCR NEGATIVE  NEGATIVE  Final     Medical History: Past Medical History  Diagnosis Date  . Thyroid disease   . Hypertension   . CHF (congestive heart failure)   . Diabetes mellitus   . Coronary artery disease   . Restless leg syndrome   . Hyperlipemia   . Renal disorder   . Sleep apnea   . Cellulitis and abscess of leg   . Obesity     Medications:  Scheduled:     . antiseptic oral rinse  15 mL Mouth Rinse QID  . chlorhexidine  15 mL Mouth Rinse BID  . enoxaparin (LOVENOX) injection  40 mg Subcutaneous Q24H  . insulin aspart  0-4 Units Subcutaneous Q4H  . insulin aspart  0-9 Units Subcutaneous TID WC  . pantoprazole (PROTONIX) IV  40 mg Intravenous Q12H  . piperacillin-tazobactam (ZOSYN)  IV  3.375 g Intravenous Q8H  . potassium chloride  10 mEq Intravenous Q1 Hr x 2   Infusions:       . dextrose    . dextrose 5 % and 0.45 % NaCl with KCl 20 mEq/L 75 mL/hr at 09/18/11 1018  . DISCONTD: dextrose 5 % and 0.9% NaCl 75 mL/hr at 09/18/11 0507  . DISCONTD: pantoprozole (PROTONIX) infusion 8 mg/hr (09/18/11 0039)   Assessment:  89YOM s/p ex lap and closer/patch of perforated duodenal ulcer on 2/13.  Day #7 Zosyn for empiric coverage.    Patient remains afebrile and WBC WNL.    Renal function continues to improve, CrCl >30 ml/min  Goal of Therapy:  Doses adjusted per renal clearance  Plan:   Continue Zosyn 3.375g IV q8h (4 hour infusion time).  MD note indicates LOT will be 7 days post op - stop date 2/20 since surgery was 2/13?   Loralee Pacas, PharmD, BCPS Pager: [phone] 09/18/2011,11:15 AM

## 2011-09-18 NOTE — Progress Notes (Signed)
Bilateral lower extremity venous duplex completed.  Preliminary report is negative for DVT, SVT, or a Baker's cyst. 

## 2011-09-18 NOTE — Progress Notes (Signed)
LOS: 6  PCCM PROGRESS NOTE  Brief patient profile:  78 y/o male with CHF, CAD, DM was admitted to [facility] on 2/13 after he presented with abdominal pain> perf DU > OR for an emergent exlap and repair of his ulcer.  PCCM is consulted for assistance with vent management am 2/14  Lines / Drains: 2/13 ETT > 2/16  Cultures / Sepsis markers:  2/14 MRSA >>> neg  Antibiotics: 2/13 zosyn (?GI sepsis)>>  [facility] Heparin SQ 2/14 >>2/16, Lovenox 2/19 (ordered but stopped before being given)c >>  Lab 09/18/11 0405 09/17/11 0457 09/15/11 0500 09/14/11 0620 09/13/11 0345  PLT 66* 69* 60* 94* 153     Tests / Events: 2/13 Ex lap, closure and patch of duodenal ulcer, jp drain x2, G-J tube placement  2/14 2 d Left ventricle: The cavity size was normal. Wall thickness was increased increased in a pattern of mild to moderate LVH. Systolic function was normal. The estimated ejection fraction was in the range of 60% to 65%. Wall motion was normal; there were no regional wall motion abnormalities.   Brief overnight :  Increase wob , poor cough    Vital Signs:   Filed Vitals:   09/17/11 1700 09/17/11 2132 09/18/11 0330 09/18/11 0700  BP: 170/97 154/84 162/76 156/77  Pulse: 80 75 67 77  Temp: 97.8 F (36.6 C) 97.1 F (36.2 C) 97.3 F (36.3 C) 98.3 F (36.8 C)  TempSrc: Oral Oral Oral Oral  Resp: 20 21 24 19   Height:      Weight:      SpO2: 94% 96%  96%    Intake/Output Summary (Last 24 hours) at 09/18/11 1153 Last data filed at 09/18/11 5621  Gross per 24 hour  Intake   1800 ml  Output   1400 ml  Net    400 ml            Physical Examination: Gen: on 2lpm sats 94% HEENT:orophax clear Neck: supple without masses/ no stridor PULM: Diminished on L > R,increase wob, shallow resp CV: Tachy, no mgr, no JVD AB: BS absent, dressings and drains in place, Ext: warm, trace edema, no clubbing, no cyanosis, scd's in place Derm: no rash or skin breakdown    Labs     Lab  09/18/11 0405 09/17/11 0457 09/15/11 0500  NA 144 144 136  K 3.5 3.4* 3.7  CL 114* 115* 109  CO2 22 22 20   BUN 16 18 23   CREATININE 1.34 1.52* 1.78*  GLUCOSE 163* 144* 163*    Lab 09/18/11 0405 09/17/11 0457 09/15/11 0500  HGB 9.7* 9.9* 9.2*  HCT 28.3* 28.9* 26.0*  WBC 6.6 7.4 8.5  PLT 66* 69* 60*   Dg Chest Port 1 View  Dg Abd 1 View  09/17/2011  *RADIOLOGY REPORT*  Clinical Data: Jejunostomy tube placement  ABDOMEN - 1 VIEW  Comparison: CT 09/12/2011  Findings: Gastrostomy tube is plate in place overlying the stomach. The surgical note indicated a gastric jejunostomy tube was  placed through the gastrostomy tube however I do not see this tubing.  Two  tubes are present overlying the right abdomen in the subhepatic space and extending in the epigastric area.  These may be  surgical drainage tubes.  Negative for bowel obstruction.  IMPRESSION: Gastrostomy tube is in good position overlying the stomach region. Gastric jejunostomy tube not identified.  Two surgical tubes are present on the right which may be surgical drains.  Original Report Authenticated By: DAVID C.  Chestine Spore, M.D.   Dg Chest Port 1 View  09/18/2011  *RADIOLOGY REPORT*  Clinical Data: Extubation, follow-up  PORTABLE CHEST - 1 VIEW  Comparison: Portable chest x-ray of 09/17/2011  Findings: No endotracheal tube is seen.  The lungs appear slightly better aerated.  Cardiomegaly, bilateral effusions and pulmonary vascular congestion remain.  A right IJ central venous line remains.  Median sternotomy sutures are noted.  IMPRESSION: Minimally improved aeration.  Little change in probable CHF with cardiomegaly, pulmonary vascular congestion, and effusions.  Original Report Authenticated By: Juline Patch, M.D.   Dg Chest Port 1 View  09/18/2011  *RADIOLOGY REPORT*  Clinical Data: Shortness of breath and wheezing; question of stridor.  PORTABLE CHEST - 1 VIEW  Comparison: Chest radiograph performed earlier today at 04:50 a.m.  Findings:  The lungs are well-aerated.  There are persistent small bilateral pleural effusions, similar in appearance to the prior study, with hazy bilateral airspace opacity, worse on the right. As before, this most likely reflects pulmonary edema, right worse than left.  Underlying vascular congestion is noted.  No pneumothorax is seen.  The cardiomediastinal silhouette is mildly enlarged; the patient is status post sternotomy, with evidence of prior CABG.  A right IJ line is noted ending about the mid SVC.  No acute osseous abnormalities are seen; chronic bony remodelling and resorption are noted at the distal right clavicle.  IMPRESSION: Stable appearance to pulmonary edema, right worse than left, with small bilateral pleural effusions; underlying vascular congestion and mild cardiomegaly noted.  Original Report Authenticated By: Tonia Ghent, M.D.   Dg Chest Port 1 View  09/17/2011  *RADIOLOGY REPORT*  Clinical Data: Atelectasis.  PORTABLE CHEST - 1 VIEW  Comparison: 09/15/2011.  Findings: Right IJ central line projects in the SVC.  The patient has been extubated in the interval.  Trachea is midline.  Heart size stable.  There are bilateral pleural effusions with mixed interstitial and airspace disease, basilar dependent.  IMPRESSION: Probable congestive heart failure, stable.  Original Report Authenticated By: Reyes Ivan, M.D.     Assessment and Plan:  Duodenal ulcer    Assessment: s/p open repair 2/13, currently not bleeding, empiric antibiotics ordered   Plan:  -post op care per surgery -monitor h/h -ppi gtt -Zoysyn ? When to d/c > defer to [facility]    Acute resp failure/ vent dep   Assessment: came from OR intubated,d/t  pain, hypotension successfully extubated 2/16 2/18 No resp distress/ sats ok on 2lpm 2/19 increased wob, add flutter ,IS 2/19 gentle diuresis,having cough with green sputum PLAN  - continue zosyn  - check bnp 09/19/11       CHF  /NS [facility] 2/19   Assessment: +balance .   ECHO 2./13/13 - normal. But had NS [facility] 2/19 associated with low phos but normal mag   Plan:  -monitor CVP for further resuscitation needs - dc d5 at 75cc/h - give d5 halfnormal kv0 -check bnp 2/20. - replete phos (RN says not corrected 2/19)  ELECTROLYTES    Lab 09/18/11 0405 09/17/11 0457 09/15/11 0500 09/14/11 0620 09/13/11 0345  K 3.5 3.4* 3.7 4.0 5.1  Phos 1.9  PLAn  - replete k phos  - recheck 2/20   CKD   Lab Results  Component Value Date   CREATININE 1.34 09/18/2011   CREATININE 1.52* 09/17/2011   CREATININE 1.78* 09/15/2011      Assessment: stage III CKD at baseline> improving   Plan:   -renally dose meds  Shock  Assessment: likely due to volume loss from GIB, and post op sedation, does not appear to be in septic shock-Resolved 2/15 with vol exp/transfusion PLAN monitor  Anemia, acute blood loss post op  Lab 09/18/11 0405 09/17/11 0457 09/15/11 0500  HGB 9.7* 9.9* 9.2*   approp response to 2 units 2/15 and drifting up since  prbc for hgb < 7gm% only   Diabetes mellitus (09/13/2011)   Assessment:    Plan:  -icu hyperglycemia protocol  Thrombocytopenia  Lab 09/18/11 0405 09/17/11 0457 09/15/11 0500 09/14/11 0620 09/13/11 0345  PLT 66* 69* 60* 94* 153   6 days LOS   Lab Results  Component Value Date   PLT 66* 09/18/2011   PLT 69* 09/17/2011   PLT 60* 09/15/2011   ? Post transfusion/ ? Hep > d/c 2/16 This appears to be a 50% drop in platelet count but happened within 48h of starting heparin and no evidence ofheparin in past 20 days on quick chart review. Low pre test for HIT.   PLAN  - dc lovenox (not yet given) 09/18/11  - check doppler legs  - check HIT panel  - depending on doppler legs consider DTI  But currently no need due to low pre-test prob(no contraindication per DRWakefield)  Best practices / Disposition:     Thromboprophylaxis: hep sq > pas 2/16 due to low plt HOB >30 degrees Ulcer prophylaxis: ppi gtt Glucose  control/hyperglycemia: icu hyperglyecmia    Brett Canales Minor ACNP Adolph Pollack PCCM Pager [phone] till 3 pm If no answer page [phone] 09/18/2011, 11:53 AM  STAFF NOTE: I, Dr Lavinia Sharps have personally reviewed patient's available data, including medical history, events of note, physical examination and test results as part of my evaluation. I have discussed with resident/NP and other care providers such as pharmacist, RN and RRT.  In addition,  I personally evaluated patient and elicited key findings of a) thrombocytopenia - > 50% drop in 4 day, b) possible diast chf; c) low phos and k; d) resolving resp failure and d/w Tresa Endo PA of ccs above   Rest per NP/medical resident whose note is outlined above and that I agree with  Dr. Kalman Shan, M.D., [facility].C.P Pulmonary and Critical Care Medicine Staff Physician [facility] Pager: [phone], If no answer or between  15:00h - 7:00h: call [phone]  09/18/2011 3:03 PM

## 2011-09-18 NOTE — Progress Notes (Signed)
Patient had a 16 beat run of vtach. Patient is asymptomatic, Dr. Carolynne Edouard notified

## 2011-09-18 NOTE — Evaluation (Signed)
Physical Therapy Evaluation Patient Details Name: [name] [name] MRN: [id number] DOB: July 18, 1920 Today's Date: 09/18/2011  Problem List:  Patient Active Problem List  Diagnoses  . UNSPECIFIED ANEMIA  . BLOOD IN STOOL  . Duodenal ulcer  . CHF (congestive heart failure)  . CKD (chronic kidney disease)  . Shock  . Respiratory failure  . Diabetes mellitus    Past Medical History:  Past Medical History  Diagnosis Date  . Thyroid disease   . Hypertension   . CHF (congestive heart failure)   . Diabetes mellitus   . Coronary artery disease   . Restless leg syndrome   . Hyperlipemia   . Renal disorder   . Sleep apnea   . Cellulitis and abscess of leg   . Obesity    Past Surgical History:  Past Surgical History  Procedure Date  . Coronary artery bypass graft     PT Assessment/Plan/Recommendation PT Assessment Clinical Impression Statement: Pt s/p ex lap and repair of ulcer with 2 JP drains.  Pt would benefit from acute PT services in order to improve independence with mobility and increase activity tolerance.  Spouse reports pt plans to go to [facility] SNF for rehab prior to return to independent living.  Pt also noted to have edematous UEs so placed UEs on pillows and educated pt to open/close fists throughout the day. PT Recommendation/Assessment: Patient will need skilled PT in the acute care venue PT Problem List: Decreased strength;Decreased activity tolerance;Decreased mobility;Decreased safety awareness PT Therapy Diagnosis : Difficulty walking;Generalized weakness PT Plan PT Frequency: Min 3X/week PT Treatment/Interventions: DME instruction;Stair training;Functional mobility training;Therapeutic activities;Therapeutic exercise;Patient/family education PT Recommendation Recommendations for Other Services: OT consult Follow Up Recommendations: Skilled nursing facility Equipment Recommended: Defer to next venue PT Goals  Acute Rehab PT Goals PT Goal  Formulation: With patient Time For Goal Achievement: 2 weeks Pt will go Supine/Side to Sit: with supervision;with rail PT Goal: Supine/Side to Sit - Progress: Goal set today Pt will go Sit to Stand: with supervision PT Goal: Sit to Stand - Progress: Goal set today Pt will go Stand to Sit: with supervision PT Goal: Stand to Sit - Progress: Goal set today Pt will Ambulate: 51 - 150 feet;with supervision;with rolling walker PT Goal: Ambulate - Progress: Goal set today Pt will Perform Home Exercise Program: with supervision, verbal cues required/provided PT Goal: Perform Home Exercise Program - Progress: Goal set today  PT Evaluation Precautions/Restrictions  Precautions Precautions: Fall Prior Functioning  Home Living Lives With: Spouse Type of Home: Independent living facility Home Adaptive Equipment: Other (comment) (rollator) Additional Comments: Plans to go to [facility] SNF upon D/C Prior Function Level of Independence: Requires assistive device for independence Comments: Pt reports using rollator. Cognition Cognition Arousal/Alertness: Awake/alert Overall Cognitive Status: Appears within functional limits for tasks assessed Sensation/Coordination   Extremity Assessment RUE Assessment RUE Assessment: Within Functional Limits (per observation (noted edematous)) LUE Assessment LUE Assessment: Within Functional Limits (per observation (noted edematous)) RLE Strength RLE Overall Strength Comments: grossly at least 3/5 per functional observation LLE Strength LLE Overall Strength Comments: grossly at least 3/5 per functional observation Mobility (including Balance) Bed Mobility Bed Mobility: Yes Supine to Sit: 3: Mod assist;HOB elevated (Comment degrees) Supine to Sit Details (indicate cue type and reason): +2 for safety, assist for trunk, verbal cues for hand placement  Sit to Supine: 3: Mod assist;HOB elevated (comment degrees) Sit to Supine - Details (indicate cue  type and reason): +2 for safety, assist for  trunk Transfers Transfers: Yes Sit to Stand: 4: Min assist;From bed;With upper extremity assist Sit to Stand Details (indicate cue type and reason): +2 for safety (JP drains and lines in neck) Stand to Sit: 4: Min assist;To bed Stand to Sit Details: +2 for safety (JP drains and lines in neck), verbal cues for safety as pt rushing 2* fatigue Ambulation/Gait Ambulation/Gait: Yes Ambulation/Gait Assistance: 4: Min assist Ambulation/Gait Assistance Details (indicate cue type and reason): +2 for safety (JP drains and lines in neck), pt with occasional buckling but good with straightening upon verbal cues, limited distance 2* to fatigue Ambulation Distance (Feet): 24 Feet Assistive device: Rolling walker Gait Pattern: Step-through pattern;Decreased stride length;Trunk flexed    Exercise    End of Session PT - End of Session Equipment Utilized During Treatment: Gait belt Activity Tolerance: Patient limited by fatigue Patient left: in bed;with call bell in reach;with family/visitor present General Behavior During Session: [facility] for tasks performed Cognition: [facility] for tasks performed  [name] [name],[name] [name] 09/18/2011, 12:09 PM Pager: [phone]

## 2011-09-18 NOTE — Evaluation (Signed)
Clinical/Bedside Swallow Evaluation Patient Details  Name: [name] [name] MRN: [id number] DOB: 08-Aug-1919 Today's Date: 09/18/2011 [id number]  Past Medical History:  Past Medical History  Diagnosis Date  . Thyroid disease   . Hypertension   . CHF (congestive heart failure)   . Diabetes mellitus   . Coronary artery disease   . Restless leg syndrome   . Hyperlipemia   . Renal disorder   . Sleep apnea   . Cellulitis and abscess of leg   . Obesity    Past Surgical History:  Past Surgical History  Procedure Date  . Coronary artery bypass graft    HPI:  Pt is an 78 year old male adm to [facility] on 09/12/11 with abdominal pain - s/p exploratory lap - patch of ulcer duodenal ulcer - JP drain x2-CJ tube.   Pt required ventilator support 2/13-2/16 for respiratory support, ? shock.  Per PCCS pt with weak cough with increased work of breathing.  Per CXR 09/18/11, pt with CHF, little change, effusion-pulmonary vascular congestion.  Pt has h/o sleep apnea-but does not use CPAP.  Pt underwent UGI today to assess for leak.  SLP phoned PA for surgery to clarify order, order for BSE to evaluate swallow ability-no concerns re asp risk with gastrogaffin.  Pt also with h/o RLS,  thyroid disease, rental d/o, obesity, CAD s/p CABG.  Pt denies h/o dysphagia.     Assessment/Recommendations/Treatment Plan Suspected Esophageal Findings Suspected Esophageal Findings: Other (comment) (h/o ulcer repair)  SLP Assessment Clinical Impression Statement: Pt presents with appearance of functional swallow, however pt observed to overtly cough the entire SLP session, with and without po intake.  Cough was productive and pt is expectorating into spitoon.  Pt reports swallow ability to be baseline, but respiratory status more compromised currently.  Incr work of breathing may impair coordination of swallow/respirations and allow episodic aspiration.  Pt observed with 2 ounces jellow and 2 ounces water, mildly increased work of  breathing noted and pt desired to dc intake.  If MD agress, recommend initiate liquids monitoring tolerance closely and allowing frequent rest breaks if pt dyspneic or overtly coughing with po.  SLP to follow briefly for tolerance with dietary advancement d/t respiratory issues.  Thanks for this referral.   Other Related Risk Factors: History of respiratory issues, pt with CHF currently per CXR 09-18-11.    Swallow Evaluation Recommendations Liquid Consistency: Thin Compensations: Slow rate;Small sips/bites (allow rest break if dyspneic) Oral Care Recommendations: Oral care BID  Treatment Plan Interventions: Aspiration precaution training;Compensatory techniques;Patient/family education  Prognosis Prognosis for Safe Diet Advancement: Fair  Individuals Consulted Consulted and Agree with Results and Recommendations: Patient  Swallowing Goals  SLP Swallowing Goals Patient will utilize recommended strategies during swallow to increase swallowing safety with: Minimal assistance  Swallow Study Prior Functional Status   Pt resided at home, stated spouse managed his medicines. Pt denies h/o CVA or dysphagia.    General  Date of Onset: 09/18/11 HPI: Pt is an 78 year old male adm to [facility] on 09/12/11 with abdominal pain - s/p exploratory lap - patch of ulcer duodenal ulcer - JP drain x2-CJ tube.   Pt required ventilator support 2/13-2/16 for respiratory support, ? shock.  Per PCCS pt with weak cough with increased work of breathing.  Per CXR 09/18/11, pt with CHF, little change, effusion-pulmonary vascular congestion.  Pt has h/o sleep apnea-but does not use CPAP.  Pt underwent UGI today to assess for leak.  SLP phoned PA for surgery  to clarify order, order for BSE to evaluate swallow ability-no concerns re asp risk with gastrogaffin.  Pt also with h/o RLS,  thyroid disease, rental d/o, obesity, CAD s/p CABG.  Pt denies h/o dysphagia.   Type of Study: Bedside swallow evaluation Diet Prior to this  Study: Regular;Thin liquids Respiratory Status: Supplemental O2 delivered via (comment) Behavior/Cognition: Alert;Cooperative;Pleasant mood Oral Cavity - Dentition: Adequate natural dentition Vision: Functional for self-feeding Patient Positioning: Postural control adequate for testing Baseline Vocal Quality: Clear Volitional Cough: Strong Volitional Swallow: Able to elicit  Oral Motor/Sensory Function  Overall Oral Motor/Sensory Function: Other (comment) (generalized weakness, productive cough)  Consistency Results  Ice Chips Ice chips: Not tested  Thin Liquid Thin Liquid: Within functional limits Presentation: Cup;Self Fed (self fed with hand over hand assistance) Other Comments: swallow was timely, but pt is coughing during all intake- water intake x2 ounces observed  Nectar Thick Liquid Nectar Thick Liquid: Not tested  Honey Thick Liquid Honey Thick Liquid: Not tested  Puree Presentation: Spoon (fed by SLP) Other Comments: jello-  2 ounces- coughing observed throughout   Solid Solid: Not tested Other Comments: pt with recent ulcer repair  Donavan Burnet, MS [facility] SLP [phone]

## 2011-09-18 NOTE — Progress Notes (Addendum)
6 Days Post-Op  Subjective:  Doing much better, no sob, hungry, passing some flatus, tol g clamped  Objective: Vital signs in last 24 hours: Temp:  [97.1 F (36.2 C)-98.9 F (37.2 C)] 98.3 F (36.8 C) (02/19 0700) Pulse Rate:  [59-80] 77  (02/19 0700) Resp:  [18-24] 19  (02/19 0700) BP: (151-170)/(62-97) 156/77 mmHg (02/19 0700) SpO2:  [94 %-98 %] 96 % (02/19 0700) Last BM Date:  (prior to admission )  Intake/Output from previous day: 02/18 0701 - 02/19 0700 In: 2250 [I.V.:2100; IV Piggyback:150] Out: 1300 [Urine:775; Drains:525] Intake/Output this shift: Total I/O In: -  Out: 300 [Urine:300]  General appearance: no distress Resp: wheezes bilaterally Cardio: RRR GI: drains with serous fluid, wound clean, abd soft and approp tender, gj in place  Lab Results:   Basename 09/18/11 0405 09/17/11 0457  WBC 6.6 7.4  HGB 9.7* 9.9*  HCT 28.3* 28.9*  PLT 66* 69*   BMET  Basename 09/18/11 0405 09/17/11 0457  NA 144 144  K 3.5 3.4*  CL 114* 115*  CO2 22 22  GLUCOSE 163* 144*  BUN 16 18  CREATININE 1.34 1.52*  CALCIUM 7.8* 7.8*   PT/INR No results found for this basename: LABPROT:2,INR:2 in the last 72 hours ABG No results found for this basename: PHART:2,PCO2:2,PO2:2,HCO3:2 in the last 72 hours  Studies/Results: Dg Abd 1 View  09/17/2011  *RADIOLOGY REPORT*  Clinical Data: Jejunostomy tube placement  ABDOMEN - 1 VIEW  Comparison: CT 09/12/2011  Findings: Gastrostomy tube is plate in place overlying the stomach. The surgical note indicated a gastric jejunostomy tube was  placed through the gastrostomy tube however I do not see this tubing.  Two  tubes are present overlying the right abdomen in the subhepatic space and extending in the epigastric area.  These may be  surgical drainage tubes.  Negative for bowel obstruction.  IMPRESSION: Gastrostomy tube is in good position overlying the stomach region. Gastric jejunostomy tube not identified.  Two surgical tubes are  present on the right which may be surgical drains.  Original Report Authenticated By: Camelia Phenes, M.D.   Dg Chest Port 1 View  09/18/2011  *RADIOLOGY REPORT*  Clinical Data: Extubation, follow-up  PORTABLE CHEST - 1 VIEW  Comparison: Portable chest x-ray of 09/17/2011  Findings: No endotracheal tube is seen.  The lungs appear slightly better aerated.  Cardiomegaly, bilateral effusions and pulmonary vascular congestion remain.  A right IJ central venous line remains.  Median sternotomy sutures are noted.  IMPRESSION: Minimally improved aeration.  Little change in probable CHF with cardiomegaly, pulmonary vascular congestion, and effusions.  Original Report Authenticated By: Juline Patch, M.D.   Dg Chest Port 1 View  09/18/2011  *RADIOLOGY REPORT*  Clinical Data: Shortness of breath and wheezing; question of stridor.  PORTABLE CHEST - 1 VIEW  Comparison: Chest radiograph performed earlier today at 04:50 a.m.  Findings: The lungs are well-aerated.  There are persistent small bilateral pleural effusions, similar in appearance to the prior study, with hazy bilateral airspace opacity, worse on the right. As before, this most likely reflects pulmonary edema, right worse than left.  Underlying vascular congestion is noted.  No pneumothorax is seen.  The cardiomediastinal silhouette is mildly enlarged; the patient is status post sternotomy, with evidence of prior CABG.  A right IJ line is noted ending about the mid SVC.  No acute osseous abnormalities are seen; chronic bony remodelling and resorption are noted at the distal right clavicle.  IMPRESSION: Stable  appearance to pulmonary edema, right worse than left, with small bilateral pleural effusions; underlying vascular congestion and mild cardiomegaly noted.  Original Report Authenticated By: Tonia Ghent, M.D.   Dg Chest Port 1 View  09/17/2011  *RADIOLOGY REPORT*  Clinical Data: Atelectasis.  PORTABLE CHEST - 1 VIEW  Comparison: 09/15/2011.  Findings:  Right IJ central line projects in the SVC.  The patient has been extubated in the interval.  Trachea is midline.  Heart size stable.  There are bilateral pleural effusions with mixed interstitial and airspace disease, basilar dependent.  IMPRESSION: Probable congestive heart failure, stable.  Original Report Authenticated By: Reyes Ivan, M.D.    Anti-infectives: Anti-infectives     Start     Dose/Rate Route Frequency Ordered Stop   09/13/11 0800  piperacillin-tazobactam (ZOSYN) IVPB 3.375 g       3.375 g 12.5 mL/hr over 240 Minutes Intravenous Every 8 hours 09/12/11 2221     09/12/11 2300  piperacillin-tazobactam (ZOSYN) IVPB 3.375 g       3.375 g 12.5 mL/hr over 240 Minutes Intravenous NOW 09/12/11 2221 09/13/11 0403   09/12/11 1800   ertapenem (INVANZ) 1 g in sodium chloride 0.9 % 50 mL IVPB  Status:  Discontinued        1 g 100 mL/hr over 30 Minutes Intravenous Every 24 hours 09/12/11 1656 09/12/11 2215          Assessment/Plan: POD 6 perf ulcer patch 1. Cont pain meds 2. Pulm toilet, cardiac meds, telemetry, ccm following when they sign off will have hospitalists follow 3. Question about where j port is, will study today to ensure correct position 4. Will get swallow today to assess repair prior to eating 5. Continue abx for 7 days postop 6. Twice daily dressing changes 7. Will change to bid protonix   LOS: 6 days    [facility] 09/18/2011

## 2011-09-18 NOTE — Progress Notes (Signed)
SLP Cancellation Note 0915  Evaluation cancelled this am due to patient receiving test:  Pt for gastrograffin/UGI study today.  SLP phoned Barnetta Chapel to clarify order.  Was informed that order is to evaluate swallow ability given pt intubation hx, and there is not a concern for aspiration of gastrograffin.    SLP to defer BSE until after UGI/gastrograffin testing.  Thanks for clarifying.   Donavan Burnet, MS [facility] SLP [phone]

## 2011-09-19 DIAGNOSIS — I5031 Acute diastolic (congestive) heart failure: Secondary | ICD-10-CM

## 2011-09-19 LAB — BASIC METABOLIC PANEL WITH GFR
BUN: 17 mg/dL (ref 6–23)
CO2: 24 meq/L (ref 19–32)
Calcium: 7.7 mg/dL — ABNORMAL LOW (ref 8.4–10.5)
Chloride: 110 meq/L (ref 96–112)
Creatinine, Ser: 1.45 mg/dL — ABNORMAL HIGH (ref 0.50–1.35)
GFR calc Af Amer: 48 mL/min — ABNORMAL LOW
GFR calc non Af Amer: 41 mL/min — ABNORMAL LOW
Glucose, Bld: 134 mg/dL — ABNORMAL HIGH (ref 70–99)
Potassium: 3.6 meq/L (ref 3.5–5.1)
Sodium: 143 meq/L (ref 135–145)

## 2011-09-19 LAB — CBC
HCT: 27.3 % — ABNORMAL LOW (ref 39.0–52.0)
Hemoglobin: 9.3 g/dL — ABNORMAL LOW (ref 13.0–17.0)
MCH: 33.7 pg (ref 26.0–34.0)
MCHC: 34.1 g/dL (ref 30.0–36.0)
MCV: 98.9 fL (ref 78.0–100.0)
Platelets: 67 K/uL — ABNORMAL LOW (ref 150–400)
RBC: 2.76 MIL/uL — ABNORMAL LOW (ref 4.22–5.81)
RDW: 14.7 % (ref 11.5–15.5)
WBC: 5.7 K/uL (ref 4.0–10.5)

## 2011-09-19 LAB — GLUCOSE, CAPILLARY
Glucose-Capillary: 125 mg/dL — ABNORMAL HIGH (ref 70–99)
Glucose-Capillary: 136 mg/dL — ABNORMAL HIGH (ref 70–99)
Glucose-Capillary: 175 mg/dL — ABNORMAL HIGH (ref 70–99)
Glucose-Capillary: 236 mg/dL — ABNORMAL HIGH (ref 70–99)
Glucose-Capillary: 246 mg/dL — ABNORMAL HIGH (ref 70–99)

## 2011-09-19 LAB — PRO B NATRIURETIC PEPTIDE: Pro B Natriuretic peptide (BNP): 14622 pg/mL — ABNORMAL HIGH (ref 0–450)

## 2011-09-19 LAB — MAGNESIUM: Magnesium: 1.9 mg/dL (ref 1.5–2.5)

## 2011-09-19 MED ORDER — POTASSIUM CHLORIDE CRYS ER 20 MEQ PO TBCR
40.0000 meq | EXTENDED_RELEASE_TABLET | Freq: Every day | ORAL | Status: DC
  Administered 2011-09-19 – 2011-09-24 (×6): 40 meq via ORAL
  Filled 2011-09-19 (×7): qty 2

## 2011-09-19 MED ORDER — FUROSEMIDE 10 MG/ML IJ SOLN
40.0000 mg | Freq: Every day | INTRAMUSCULAR | Status: DC
  Administered 2011-09-20 – 2011-09-21 (×2): 40 mg via INTRAVENOUS
  Filled 2011-09-19 (×3): qty 4

## 2011-09-19 MED ORDER — FUROSEMIDE 10 MG/ML IJ SOLN
40.0000 mg | Freq: Once | INTRAMUSCULAR | Status: AC
  Administered 2011-09-19: 40 mg via INTRAVENOUS
  Filled 2011-09-19: qty 4

## 2011-09-19 MED ORDER — PRO-STAT SUGAR FREE PO LIQD
30.0000 mL | Freq: Four times a day (QID) | ORAL | Status: DC
  Administered 2011-09-19 – 2011-09-20 (×5): 30 mL
  Filled 2011-09-19 (×7): qty 30

## 2011-09-19 MED ORDER — FUROSEMIDE 10 MG/ML IJ SOLN
20.0000 mg | Freq: Once | INTRAMUSCULAR | Status: AC
  Administered 2011-09-19: 16:00:00 via INTRAVENOUS
  Filled 2011-09-19: qty 2

## 2011-09-19 MED ORDER — OSMOLITE 1.2 CAL PO LIQD
1000.0000 mL | ORAL | Status: DC
  Administered 2011-09-19 – 2011-09-20 (×2): 1000 mL via ORAL
  Filled 2011-09-19 (×3): qty 1000

## 2011-09-19 NOTE — Progress Notes (Addendum)
Nutrition Follow-up  Diet Order:  Clear liquids  Order received for initiation of TFs via J-tube. PT WITH MILK-RELATED ALLERGY.  Discussed with pt who states he had a reaction to milk as child- does not remember reaction.  He has had milk products including ice cream and yogurt without problems throughout his lifetime.  All TF products contain milk ingredients and are suitable for lactose intolerance.  Informed pt of TFs to start, will alert if not tolerated.  09/18/2011 *RADIOLOGY REPORT* Clinical Data: Postop from repair of perforated duodenal ulcer. Check G J tube placement and rule out leak at tube site. CONTRAST INJECTION OF GASTROJEJUNOSTOMY TUBE UNDER FLUOROSCOPY Fluoroscopy time: 0.2 minutes Findings: Omnipaque-300 water-soluble contrast was injected through the existing gastrojejunostomy tube under fluoroscopy. This shows that the G J tube is patent, and is normal in position with the tip in the distal duodenum near the ligament of Treitz. Injected contrast opacifies the gastric antrum and duodenum. There is no evidence of contrast leak or extravasation from the stomach or along the course of the G J tube. IMPRESSION: Patent percutaneous G J tube, with tip in distal duodenum near the ligament of Treitz. No evidence of contrast leak or extravasation. Original Report Authenticated By: Danae Orleans, M.D.   Pt seen and assessed by SLP yesterday who noted increased coughing independent of PO intake.  Pt has been advanced to clear liquids.  He consumed 100% of his clear liquid tray this am.  Pt had been reporting hunger. Pt sleeping at time of visit.  RN confirms pt did well with tray this am.  Now resting comfortably.  Pt having BMs and passing flatus.  Last BM was yesterday. Afebrile.  Meds: Scheduled Meds:   . antiseptic oral rinse  15 mL Mouth Rinse QID  . chlorhexidine  15 mL Mouth Rinse BID  . furosemide  40 mg Intravenous Once  . furosemide  40 mg Intravenous Once  . insulin aspart   0-4 Units Subcutaneous Q4H  . insulin aspart  0-9 Units Subcutaneous TID WC  . pantoprazole (PROTONIX) IV  40 mg Intravenous Q12H  . piperacillin-tazobactam (ZOSYN)  IV  3.375 g Intravenous Q8H  . potassium chloride  10 mEq Intravenous Q1 Hr x 4  . potassium phosphate IVPB (mmol)  20 mmol Intravenous Once  . DISCONTD: enoxaparin (LOVENOX) injection  40 mg Subcutaneous Q24H  . DISCONTD: potassium phosphate IVPB (mmol)  20 mmol Intravenous Once   Continuous Infusions:   . dextrose 5 % and 0.45% NaCl 20 mL/hr (09/18/11 1630)  . DISCONTD: dextrose    . DISCONTD: dextrose 5 % and 0.45 % NaCl with KCl 20 mEq/L 75 mL/hr at 09/18/11 1018   PRN Meds:.sodium chloride  Labs:  CMP     Component Value Date/Time   NA 143 09/19/2011 0520   K 3.6 09/19/2011 0520   CL 110 09/19/2011 0520   CO2 24 09/19/2011 0520   GLUCOSE 134* 09/19/2011 0520   BUN 17 09/19/2011 0520   CREATININE 1.45* 09/19/2011 0520   CALCIUM 7.7* 09/19/2011 0520   PROT 5.4* 09/13/2011 0345   ALBUMIN 2.2* 09/13/2011 0345   AST 24 09/13/2011 0345   ALT 15 09/13/2011 0345   ALKPHOS 53 09/13/2011 0345   BILITOT 0.3 09/13/2011 0345   GFRNONAA 41* 09/19/2011 0520   GFRAA 48* 09/19/2011 0520     Intake/Output Summary (Last 24 hours) at 09/19/11 1021 Last data filed at 09/19/11 0954  Gross per 24 hour  Intake   1655  ml  Output   2920 ml  Net  -1265 ml    Weight Status:  Overall stable Admission wt:  227 lbs Current wt: 232 lbs  Restatement of needs:  2020-2220 kcal, 88-105g protein  Nutrition Dx:  Inadequate oral intake, ongoing.  Pt diet advanced this am to clear liquids  Intervention:  1.  Enteral nutrition; initiate Osmolite 1.2 @ 20 mL continuous with Prostat 4 times daily to provide 864 kcal, 86g protein, 393 mL free water.  No advancements at this time. 2.  Food/Beverage; diet advancement per SLP/MD discretion.  Pt tolerated clear liquids this am.  Monitor:   1.  Enteral nutrition;  Initiation with tolerance.  TFs to meet  100% pt protein needs, supplement kcal intake from PO diet for now.  Adjustments to be made as PO intake assessed. 2.  Food/Beverage; diet advancement with tolerance.  TFs should not contribute to pt's past c/o early satiety given they are provided via J-tube.  Will assess affect on appetite as PO diet advances.   Hoyt Koch Pager #:  [phone]

## 2011-09-19 NOTE — Progress Notes (Signed)
  Speech Pathology: Dysphagia Treatment Note  Patient was observed with : Regular (whole cracker), 4 ounces icecream and liquids ([place]ale, water).  Intake documented as 100% of breakfast and nearly all of lunch (pt stated he "got full").    Patient was noted to have s/s of aspiration : yes:  Pt with overt cough observed, increased incidence with po intake, SLP questions if this is due to pt with muscular effort that is increasing cough with any movement, ? CHF.  Can not rule out overt aspiration at bedside, but doubt further testing or diet modification would decr aspiration risk.  Today cough appeared nonproductive.  Pt again reports swallow function to be baseline and spouse observed pt to take po and agreed.  Wife present and verbalized that pt coughs as soon as he sits down to dinner table-before po intake initiated.  ? CHF.    CXR 09-18-11 Improved pulmonary venous hypertension and pulmonary edema since earlier in the day. Stable large bilateral pleural effusions, right greater than left, and associated dense passive atelectasis and/or pneumonia in the lower lobes. No new abnormalities.  Lung Sounds: non productive cough   Temperature: afebrile  Patient required:  No verbal cues, pt takes small boluses without cues  Clinical Impression: Swallow likely at baseline- per pt and spouse.  SLP educated family and pt to chronic aspiration risk if pt dyspneic with po intake and multiple risk factors for aspiration pna.    Recommendations:  Advance diet as tolerated per surgery.    Pain:   none Intervention Required:   No  Goals: All Goals Met  Donavan Burnet, MS [facility] SLP [phone]

## 2011-09-19 NOTE — Progress Notes (Signed)
LOS: 7  PCCM PROGRESS NOTE  Brief patient profile:  78 y/o male with CHF, CAD, DM was admitted to [facility] on 2/13 after he presented with abdominal pain> perf DU > OR for an emergent exlap and repair of his ulcer.  PCCM is consulted for assistance with vent management am 2/14  Lines / Drains: 2/13 ETT > 2/16  Cultures / Sepsis markers:  2/14 MRSA >>> neg  Antibiotics: 2/13 zosyn (?GI sepsis)>>  [facility] Heparin SQ 2/14 >>2/16, Lovenox 2/19 (ordered but stopped before being given)c >>  Lab 09/19/11 0520 09/18/11 0405 09/17/11 0457 09/15/11 0500 09/14/11 0620  PLT 67* 66* 69* 60* 94*     Tests / Events: 2/13 Ex lap, closure and patch of duodenal ulcer, jp drain x2, G-J tube placement  2/14 2 d Left ventricle: The cavity size was normal. Wall thickness was increased increased in a pattern of mild to moderate LVH. Systolic function was normal. The estimated ejection fraction was in the range of 60% to 65%. Wall motion was normal; there were no regional wall motion abnormalities.   Brief overnight :  Improved this am. Looks stronger though still deconditioned    Vital Signs:   Filed Vitals:   09/18/11 0700 09/18/11 1449 09/18/11 2121 09/19/11 0526  BP: 156/77 158/76 144/80 159/71  Pulse: 77 62 64 61  Temp: 98.3 F (36.8 C) 97.5 F (36.4 C) 97.9 F (36.6 C) 97.8 F (36.6 C)  TempSrc: Oral Oral Oral Oral  Resp: 19 22 20 18   Height:      Weight:    232 lb 2.3 oz (105.3 kg)  SpO2: 96%  97% 97%    Intake/Output Summary (Last 24 hours) at 09/19/11 1225 Last data filed at 09/19/11 0954  Gross per 24 hour  Intake   1655 ml  Output   2965 ml  Net  -1310 ml            Physical Examination: Gen: on 2lpm sats 94%, More alert HEENT:orophax clear Neck: supple without masses/ no stridor PULM: Diminished on L > R,increase wob, shallow resp CV: Tachy, no mgr, no JVD AB: BS absent, dressings and drains in place, Ext: warm, trace edema, no clubbing, no cyanosis,  scd's in place Derm: no rash or skin breakdown    Labs     Lab 09/19/11 0520 09/18/11 0405 09/17/11 0457  NA 143 144 144  K 3.6 3.5 3.4*  CL 110 114* 115*  CO2 24 22 22   BUN 17 16 18   CREATININE 1.45* 1.34 1.52*  GLUCOSE 134* 163* 144*    Lab 09/19/11 0520 09/18/11 0405 09/17/11 0457  HGB 9.3* 9.7* 9.9*  HCT 27.3* 28.3* 28.9*  WBC 5.7 6.6 7.4  PLT 67* 66* 69*   Dg Chest Port 1 View  Dg Chest 2 View  09/18/2011  *RADIOLOGY REPORT*  Clinical Data: Shortness of breath.  Follow up CHF and effusions.  CHEST - 2 VIEW 09/18/2011 1210 hours:  Comparison: Portable chest x-ray earlier same date 0526 hours and dating back to 09/14/2011.  Findings: Prior sternotomy for CABG.  Cardiac silhouette enlarged but stable.  Interval improvement in the pulmonary venous hypertension and interstitial pulmonary edema, though the pulmonary venous hypertension persists.  Stable large bilateral pleural effusions, right greater than left, and associated dense consolidation in the lower lobes.  Right jugular central venous catheter tip remains in the SVC.  No new abnormalities.  IMPRESSION: Improved pulmonary venous hypertension and pulmonary edema since earlier in the  day.  Stable large bilateral pleural effusions, right greater than left, and associated dense passive atelectasis and/or pneumonia in the lower lobes.  No new abnormalities.  Original Report Authenticated By: Arnell Sieving, M.D.   Dg Chest Port 1 View  09/18/2011  *RADIOLOGY REPORT*  Clinical Data: Extubation, follow-up  PORTABLE CHEST - 1 VIEW  Comparison: Portable chest x-ray of 09/17/2011  Findings: No endotracheal tube is seen.  The lungs appear slightly better aerated.  Cardiomegaly, bilateral effusions and pulmonary vascular congestion remain.  A right IJ central venous line remains.  Median sternotomy sutures are noted.  IMPRESSION: Minimally improved aeration.  Little change in probable CHF with cardiomegaly, pulmonary vascular  congestion, and effusions.  Original Report Authenticated By: Juline Patch, M.D.   Dg Chest Port 1 View  09/18/2011  *RADIOLOGY REPORT*  Clinical Data: Shortness of breath and wheezing; question of stridor.  PORTABLE CHEST - 1 VIEW  Comparison: Chest radiograph performed earlier today at 04:50 a.m.  Findings: The lungs are well-aerated.  There are persistent small bilateral pleural effusions, similar in appearance to the prior study, with hazy bilateral airspace opacity, worse on the right. As before, this most likely reflects pulmonary edema, right worse than left.  Underlying vascular congestion is noted.  No pneumothorax is seen.  The cardiomediastinal silhouette is mildly enlarged; the patient is status post sternotomy, with evidence of prior CABG.  A right IJ line is noted ending about the mid SVC.  No acute osseous abnormalities are seen; chronic bony remodelling and resorption are noted at the distal right clavicle.  IMPRESSION: Stable appearance to pulmonary edema, right worse than left, with small bilateral pleural effusions; underlying vascular congestion and mild cardiomegaly noted.  Original Report Authenticated By: Tonia Ghent, M.D.   Dg Kayleen Memos W/water Sol Cm  09/18/2011  *RADIOLOGY REPORT*  Clinical Data:  Postop from repair of perforated duodenal ulcer. Evaluate for postop anastomotic leak.  UPPER GI SERIES WITHOUT KUB  Technique:  Routine upper GI series was performed with water- soluble Omnipaque-300.  Fluoroscopy Time: 1.3 minutes  Comparison:  None.  Findings: No evidence of esophageal mass or stricture.  No evidence of hiatal hernia.  Evaluation of the stomach was somewhat limited due to incomplete distention on this single column exam, however there is no evidence of gastric mass or obstruction.  Prompt contrast opacification of the duodenum and proximal small bowel is seen.  A percutaneous G J tube is seen in place, with tip in the distal duodenum near the ligament of Treitz.  Irregular  contour the duodenal bulb is seen, however there is no evidence of contrast leak or extravasation.  The duodenum and proximal jejunum are nondilated.  IMPRESSION: Expected postoperative changes from repair of perforated duodenal ulcer.  No evidence of anastomotic leak or other significant abnormality.  Original Report Authenticated By: Danae Orleans, M.D.   Dg Cm Inj Any Colonic Tube W/fluoro  09/18/2011  *RADIOLOGY REPORT*  Clinical Data: Postop from repair of perforated duodenal ulcer. Check G J tube placement and rule out leak at tube site.  CONTRAST INJECTION OF GASTROJEJUNOSTOMY TUBE UNDER FLUOROSCOPY  Fluoroscopy time:  0.2 minutes  Findings: Omnipaque-300 water-soluble contrast was injected through the existing gastrojejunostomy tube under fluoroscopy.  This shows that the G J tube is patent, and is normal in position with the tip in the distal duodenum near the ligament of Treitz. Injected contrast opacifies the gastric antrum and duodenum.  There is no evidence of contrast leak or extravasation  from the stomach or along the course of the G J tube.  IMPRESSION: Patent percutaneous G J tube, with tip in distal duodenum near the ligament of Treitz.  No evidence of contrast leak or extravasation.  Original Report Authenticated By: Danae Orleans, M.D.     Assessment and Plan:  Duodenal ulcer    Assessment: s/p open repair 2/13, currently not bleeding, empiric antibiotics ordered   Plan:  -post op care per surgery -monitor h/h -ppi gtt -Zoysyn ? When to d/c > defer to [facility]    Acute resp failure/ vent dep   Assessment: came from OR intubated,d/t  pain, hypotension successfully extubated 2/16 2/18 No resp distress/ sats ok on 2lpm 2/19 increased wob, add flutter ,IS 2/19 gentle diuresis,having cough with green sputum 2/20 bnp 14622 PLAN  - continue zosyn  - diurese       CHF - DIASTOLIC/ACUTE and also  NS- [facility] 2/19   Assessment: +balance .  ECHO 2./13/13 - normal. But had NS [facility]  2/19 associated with low phos but normal mag and phos repleted 09/18/11.  Lab 09/19/11 0520 09/15/11 0500  PROBNP 14622.0* 1579.0*   Plan:  -diurese on starting  09/19/2011 - saline  Lock IV fluids   ELECTROLYTES    Lab 09/19/11 0520 09/18/11 0405 09/17/11 0457 09/15/11 0500 09/14/11 0620  K 3.6 3.5 3.4* 3.7 4.0    PLAn  - replete and monitor   CKD   Lab Results  Component Value Date   CREATININE 1.45* 09/19/2011   CREATININE 1.34 09/18/2011   CREATININE 1.52* 09/17/2011      Assessment: stage III CKD at baseline> improving   Plan:   -renally dose meds - monintor with diuresis  Shock    Assessment: likely due to volume loss from GIB, and post op sedation, does not appear to be in septic shock-Resolved 2/15 with vol exp/transfusion PLAN monitor  Anemia, acute blood loss post op  Lab 09/19/11 0520 09/18/11 0405 09/17/11 0457  HGB 9.3* 9.7* 9.9*   approp response to 2 units 2/15 and drifting up since  prbc for hgb < 7gm% only   Diabetes mellitus (09/13/2011)   Assessment:    Plan:   - floor ssi protocol  Thrombocytopenia  Lab 09/19/11 0520 09/18/11 0405 09/17/11 0457 09/15/11 0500 09/14/11 0620  PLT 67* 66* 69* 60* 94*   7 days LOS on 09/19/2011   This appears to be a 50% drop in platelet count but happened within 48h of starting heparin (s/p heparin 2/14 - 2/16) and no evidence ofheparin in past 20 days on quick chart review. Low pre test for HIT.   PLAN  - dc lovenox (not yet given) 09/18/11; no role for lovenox with low platelets  - Continue SCDs  - check doppler legs; still pending as of 09/19/11  - await HIT panel from 09/18/11  - depending on doppler legs consider DTI  But currently no need due to low pre-test prob (no contraindication for DTI per DRWakefield)  Best practices / Disposition:     Thromboprophylaxis: hep sq > pas 2/16 due to low plt HOB >30 degrees Ulcer prophylaxis: ppi gtt Glucose control/hyperglycemia: icu hyperglyecmia -hit panel  pend.   Brett Canales Minor ACNP [facility] PCCM Pager [phone] till 3 pm If no answer page [phone] 09/19/2011, 12:25 PM      STAFF NOTE: I, Dr Lavinia Sharps have personally reviewed patient's available data, including medical history, events of note, physical examination and test results as part  of my evaluation. I have discussed with resident/NP and other care providers such as pharmacist, RN and RRT.  In addition,  I personally evaluated patient and elicited key findings of s/p extubation for post op acute resp failure. Currently appears to have diastolic acute chf and will diurese and will saline lock IVs. In addition, has low platelet which is low pretest prob for HIT but still should avoid lovenox and await doppler legs  Rest per NP/medical resident whose note is outlined above and that I agree with  Dr. Kalman Shan, M.D., [facility].C.P Pulmonary and Critical Care Medicine Staff Physician [facility] [place] Pager: [phone], If no answer or between  15:00h - 7:00h: call [phone]  09/19/2011 3:10 PM

## 2011-09-19 NOTE — Clinical Documentation Improvement (Signed)
[name] [name] DOCUMENTATION CLARIFICATION QUERY  THIS DOCUMENT IS NOT A PERMANENT PART OF THE MEDICAL RECORD  TO RESPOND TO THE THIS QUERY, FOLLOW THE INSTRUCTIONS BELOW:  1. If needed, update documentation for the patient's encounter via the notes activity.  2. Access this query again and click edit on the In [employer].  3. After updating, or not, click F2 to complete all highlighted (required) fields concerning your review. Select "additional documentation in the medical record" OR "no additional documentation provided".  4. Click Sign note button.  5. The deficiency will fall out of your In Basket *Please let us know if you are not able to complete this workflow by phone or e-mail (listed below).  Please update your documentation within the medical record to reflect your response to this query.                                                                                    09/19/11  Dear Dr. [facility],  In a better effort to capture your patient's severity of illness, reflect appropriate length of stay and utilization of resources, a review of the patient medical record has revealed the following indicators the diagnosis of Heart Failure.    Based on your clinical judgment, please clarify and document in a progress note and/or discharge summary the clinical condition associated with the following supporting information:  In responding to this query please exercise your independent judgment.  The fact that a query is asked, does not imply that any particular answer is desired or expected.    According to pn pt with [name] [name]. Please clarify the the acuity and type of [name] [name] and document in pn and  d/c summary.   Best Practice:  Documenting the acuity and type of [name] [name] for each inpatient admission provides accuracy and clarity of the SOI/ROM.      Possible Clinical Conditions?  Acute Systolic Congestive Heart Failure Acute Diastolic Congestive Heart Failure Acute  Systolic & Diastolic Congestive Heart Failure Acute on Chronic Systolic Congestive Heart Failure Acute on Chronic Diastolic Congestive Heart Failure Acute on Chronic Systolic & Diastolic  Congestive Heart Failure Other Condition________________________________________ Cannot Clinically Determine  Supporting Information:  Risk Factors: GIB, CAD, [name] [name], Dehydration, Acute Resp Failure, Renal failure Stage III  Signs & Symptoms:  PN 09/18/11 possible diast [name] [name];   [name] [name]  /VS [facility] 2/19   Assessment: +balance .  ECHO 2./13/13 - normal. But had NS [facility] 2/19 associated with low phos but normal mag   Plan:   -monitor CVP for further resuscitation needs - dc d5 at 75cc/h - give d5 halfnormal kv0 -check bnp 2/20. - replete phos (RN says not corrected 2/19)   CXR 09/18/11 IMPRESSION: Stable appearance to pulmonary edema, right worse than left, with small bilateral pleural effusions; underlying vascular congestion and mild cardiomegaly noted  IMPRESSION: Minimally improved aeration.  Little change in probable [name] [name] with cardiomegaly, pulmonary vascular congestion, and effusions.     Diagnostics: Component     Latest Ref Rng 09/19/2011  Pro B Natriuretic peptide (BNP)     0 - 450 pg/mL 14622.0 (H)     Treatment: Lasix  D5 w/ 0.45 NS  w/ KCL  Reviewed: additional documentation in the medical record ljh  Thank You,  Enis Slipper  RN, BSN, CCDS Clinical Documentation Specialist [facility] Dept Pager: [phone] / E-mail: [email or web address]  Health Information Management [place]

## 2011-09-19 NOTE — Progress Notes (Signed)
7 Days Post-Op  Subjective: Working a little to breath this am, having bm and passing flatus, coughing up clear phlegm, no n/v  Objective: Vital signs in last 24 hours: Temp:  [97.5 F (36.4 C)-97.9 F (36.6 C)] 97.8 F (36.6 C) (02/20 0526) Pulse Rate:  [61-64] 61  (02/20 0526) Resp:  [18-22] 18  (02/20 0526) BP: (144-159)/(71-80) 159/71 mmHg (02/20 0526) SpO2:  [97 %] 97 % (02/20 0526) Weight:  [232 lb 2.3 oz (105.3 kg)] 232 lb 2.3 oz (105.3 kg) (02/20 0526) Last BM Date: 09/18/11  Intake/Output from previous day: 02/19 0701 - 02/20 0700 In: 1350 [I.V.:295; IV Piggyback:1055] Out: 2995 [Urine:2950; Drains:45] Intake/Output this shift: Total I/O In: -  Out: 100 [Urine:100]  General appearance: no distress Resp: wheezes bibasilar Cardio: RRR GI: wound clean, jps with serous output nonbilious, g tube site clean bs present appropr tender  Lab Results:   Basename 09/19/11 0520 09/18/11 0405  WBC 5.7 6.6  HGB 9.3* 9.7*  HCT 27.3* 28.3*  PLT 67* 66*   BMET  Basename 09/19/11 0520 09/18/11 0405  NA 143 144  K 3.6 3.5  CL 110 114*  CO2 24 22  GLUCOSE 134* 163*  BUN 17 16  CREATININE 1.45* 1.34  CALCIUM 7.7* 7.8*   PT/INR No results found for this basename: LABPROT:2,INR:2 in the last 72 hours ABG No results found for this basename: PHART:2,PCO2:2,PO2:2,HCO3:2 in the last 72 hours  Studies/Results: Dg Chest 2 View  09/18/2011  *RADIOLOGY REPORT*  Clinical Data: Shortness of breath.  Follow up CHF and effusions.  CHEST - 2 VIEW 09/18/2011 1210 hours:  Comparison: Portable chest x-ray earlier same date 0526 hours and dating back to 09/14/2011.  Findings: Prior sternotomy for CABG.  Cardiac silhouette enlarged but stable.  Interval improvement in the pulmonary venous hypertension and interstitial pulmonary edema, though the pulmonary venous hypertension persists.  Stable large bilateral pleural effusions, right greater than left, and associated dense consolidation in  the lower lobes.  Right jugular central venous catheter tip remains in the SVC.  No new abnormalities.  IMPRESSION: Improved pulmonary venous hypertension and pulmonary edema since earlier in the day.  Stable large bilateral pleural effusions, right greater than left, and associated dense passive atelectasis and/or pneumonia in the lower lobes.  No new abnormalities.  Original Report Authenticated By: Arnell Sieving, M.D.   Dg Abd 1 View  09/17/2011  *RADIOLOGY REPORT*  Clinical Data: Jejunostomy tube placement  ABDOMEN - 1 VIEW  Comparison: CT 09/12/2011  Findings: Gastrostomy tube is plate in place overlying the stomach. The surgical note indicated a gastric jejunostomy tube was  placed through the gastrostomy tube however I do not see this tubing.  Two  tubes are present overlying the right abdomen in the subhepatic space and extending in the epigastric area.  These may be  surgical drainage tubes.  Negative for bowel obstruction.  IMPRESSION: Gastrostomy tube is in good position overlying the stomach region. Gastric jejunostomy tube not identified.  Two surgical tubes are present on the right which may be surgical drains.  Original Report Authenticated By: Camelia Phenes, M.D.   Dg Chest Port 1 View  09/18/2011  *RADIOLOGY REPORT*  Clinical Data: Extubation, follow-up  PORTABLE CHEST - 1 VIEW  Comparison: Portable chest x-ray of 09/17/2011  Findings: No endotracheal tube is seen.  The lungs appear slightly better aerated.  Cardiomegaly, bilateral effusions and pulmonary vascular congestion remain.  A right IJ central venous line remains.  Median sternotomy  sutures are noted.  IMPRESSION: Minimally improved aeration.  Little change in probable CHF with cardiomegaly, pulmonary vascular congestion, and effusions.  Original Report Authenticated By: Juline Patch, M.D.   Dg Chest Port 1 View  09/18/2011  *RADIOLOGY REPORT*  Clinical Data: Shortness of breath and wheezing; question of stridor.  PORTABLE  CHEST - 1 VIEW  Comparison: Chest radiograph performed earlier today at 04:50 a.m.  Findings: The lungs are well-aerated.  There are persistent small bilateral pleural effusions, similar in appearance to the prior study, with hazy bilateral airspace opacity, worse on the right. As before, this most likely reflects pulmonary edema, right worse than left.  Underlying vascular congestion is noted.  No pneumothorax is seen.  The cardiomediastinal silhouette is mildly enlarged; the patient is status post sternotomy, with evidence of prior CABG.  A right IJ line is noted ending about the mid SVC.  No acute osseous abnormalities are seen; chronic bony remodelling and resorption are noted at the distal right clavicle.  IMPRESSION: Stable appearance to pulmonary edema, right worse than left, with small bilateral pleural effusions; underlying vascular congestion and mild cardiomegaly noted.  Original Report Authenticated By: Tonia Ghent, M.D.   Dg Kayleen Memos W/water Sol Cm  09/18/2011  *RADIOLOGY REPORT*  Clinical Data:  Postop from repair of perforated duodenal ulcer. Evaluate for postop anastomotic leak.  UPPER GI SERIES WITHOUT KUB  Technique:  Routine upper GI series was performed with water- soluble Omnipaque-300.  Fluoroscopy Time: 1.3 minutes  Comparison:  None.  Findings: No evidence of esophageal mass or stricture.  No evidence of hiatal hernia.  Evaluation of the stomach was somewhat limited due to incomplete distention on this single column exam, however there is no evidence of gastric mass or obstruction.  Prompt contrast opacification of the duodenum and proximal small bowel is seen.  A percutaneous G J tube is seen in place, with tip in the distal duodenum near the ligament of Treitz.  Irregular contour the duodenal bulb is seen, however there is no evidence of contrast leak or extravasation.  The duodenum and proximal jejunum are nondilated.  IMPRESSION: Expected postoperative changes from repair of perforated  duodenal ulcer.  No evidence of anastomotic leak or other significant abnormality.  Original Report Authenticated By: Danae Orleans, M.D.   Dg Cm Inj Any Colonic Tube W/fluoro  09/18/2011  *RADIOLOGY REPORT*  Clinical Data: Postop from repair of perforated duodenal ulcer. Check G J tube placement and rule out leak at tube site.  CONTRAST INJECTION OF GASTROJEJUNOSTOMY TUBE UNDER FLUOROSCOPY  Fluoroscopy time:  0.2 minutes  Findings: Omnipaque-300 water-soluble contrast was injected through the existing gastrojejunostomy tube under fluoroscopy.  This shows that the G J tube is patent, and is normal in position with the tip in the distal duodenum near the ligament of Treitz. Injected contrast opacifies the gastric antrum and duodenum.  There is no evidence of contrast leak or extravasation from the stomach or along the course of the G J tube.  IMPRESSION: Patent percutaneous G J tube, with tip in distal duodenum near the ligament of Treitz.  No evidence of contrast leak or extravasation.  Original Report Authenticated By: Danae Orleans, M.D.     Assessment/Plan: POD 7 perf du with patch 1. Cont pain meds 2. Cv/pulm- lasix this am, ccm following I am concerned with pulmonary status and will follow up with them later 3. GI- will give clears per speech recs, will also start low rate tube feeds via j tube  due to concern with nutrition 4. Stop abx tomorrow, will be 7 days for contamination after perf du 5. I would like to start lovenox will discuss with ccm  LOS: 7 days    [facility] 09/19/2011

## 2011-09-20 DIAGNOSIS — K260 Acute duodenal ulcer with hemorrhage: Secondary | ICD-10-CM

## 2011-09-20 DIAGNOSIS — D696 Thrombocytopenia, unspecified: Secondary | ICD-10-CM

## 2011-09-20 DIAGNOSIS — J9600 Acute respiratory failure, unspecified whether with hypoxia or hypercapnia: Secondary | ICD-10-CM

## 2011-09-20 DIAGNOSIS — I5031 Acute diastolic (congestive) heart failure: Secondary | ICD-10-CM

## 2011-09-20 LAB — GLUCOSE, CAPILLARY
Glucose-Capillary: 199 mg/dL — ABNORMAL HIGH (ref 70–99)
Glucose-Capillary: 235 mg/dL — ABNORMAL HIGH (ref 70–99)

## 2011-09-20 LAB — HEPARIN INDUCED THROMBOCYTOPENIA PNL
Patient O.D.: 0.14
UFH High Dose UFH H: 0 % Release
UFH Low Dose 0.1 IU/mL: 0 % Release
UFH Low Dose 0.5 IU/mL: 0 % Release
UFH SRA Result: NEGATIVE

## 2011-09-20 LAB — BASIC METABOLIC PANEL
Calcium: 7.8 mg/dL — ABNORMAL LOW (ref 8.4–10.5)
Creatinine, Ser: 1.45 mg/dL — ABNORMAL HIGH (ref 0.50–1.35)
GFR calc non Af Amer: 41 mL/min — ABNORMAL LOW (ref >90)
Sodium: 138 mEq/L (ref 135–145)

## 2011-09-20 LAB — CBC
MCH: 34.3 pg — ABNORMAL HIGH (ref 26.0–34.0)
MCHC: 34.7 g/dL (ref 30.0–36.0)
MCV: 99 fL (ref 78.0–100.0)
Platelets: 68 10*3/uL — ABNORMAL LOW (ref 150–400)

## 2011-09-20 MED ORDER — PANTOPRAZOLE SODIUM 40 MG PO PACK
40.0000 mg | PACK | Freq: Two times a day (BID) | ORAL | Status: DC
  Administered 2011-09-20 – 2011-09-24 (×8): 40 mg
  Filled 2011-09-20 (×13): qty 20

## 2011-09-20 NOTE — Progress Notes (Signed)
LOS: 8 days  PCCM PROGRESS NOTE  Brief patient profile:  78 y/o male with CHF, CAD, DM was admitted to [facility] on 2/13 after he presented with abdominal pain> perf DU > OR for an emergent exlap and repair of his ulcer.  PCCM is consulted for assistance with vent management am 2/14  Lines / Drains: 2/13 ETT > 2/16  Cultures / Sepsis markers:  2/14 MRSA >>> neg  Antibiotics: 2/13 zosyn (?GI sepsis)>>  [facility] Heparin SQ 2/14 >>2/16, Lovenox 2/19 (ordered but stopped before being given) >> (can be restarted if HIT panel 2/19 negative)  Lab 09/20/11 0520 09/19/11 0520 09/18/11 0405 09/17/11 0457 09/15/11 0500  PLT 68* 67* 66* 69* 60*     Tests / Events: 2/13 Ex lap, closure and patch of duodenal ulcer, jp drain x2, G-J tube placement  2/14 2 d Left ventricle: The cavity size was normal. Wall thickness was increased increased in a pattern of mild to moderate LVH. Systolic function was normal. The estimated ejection fraction was in the range of 60% to 65%. Wall motion was normal; there were no regional wall motion abnormalities.\  2/19 leds Bilateral lower extremity venous duplex completed. Preliminary report is negative for DVT, SVT, or a Baker's cyst.  2/19 HIT panel >>   Brief overnight :  Improved this am. Looks stronger though still deconditioned    Vital Signs:   Filed Vitals:   09/19/11 0526 09/19/11 1440 09/19/11 2113 09/20/11 0448  BP: 159/71 152/74 148/75 158/76  Pulse: 61 74 64 77  Temp: 97.8 F (36.6 C) 98 F (36.7 C) 97.6 F (36.4 C) 98.6 F (37 C)  TempSrc: Oral Oral Oral Oral  Resp: 18 20 18 18   Height:      Weight: 232 lb 2.3 oz (105.3 kg)   233 lb 7.5 oz (105.9 kg)  SpO2: 97% 96% 96% 96%    Intake/Output Summary (Last 24 hours) at 09/20/11 1054 Last data filed at 09/20/11 1610  Gross per 24 hour  Intake   2430 ml  Output    815 ml  Net   1615 ml            Physical Examination: Gen: on 2lpm sats 94%, More alert HEENT:orophax  clear Neck: supple without masses/ no stridor PULM: Diminished on L > R,decrease wob, shallow resp CV: Tachy, no mgr, no JVD AB: BS absent, dressings and drains in place, Ext: warm, trace edema, no clubbing, no cyanosis, scd's in place Derm: no rash or skin breakdown    Labs    Lab 09/19/11 0520 09/15/11 0500  PROBNP 14622.0* 1579.0*     Lab 09/20/11 0520 09/19/11 0520 09/18/11 0405  NA 138 143 144  K 3.5 3.6 3.5  CL 102 110 114*  CO2 28 24 22   BUN 22 17 16   CREATININE 1.45* 1.45* 1.34  GLUCOSE 221* 134* 163*    Lab 09/20/11 0520 09/19/11 0520 09/18/11 0405  HGB 10.5* 9.3* 9.7*  HCT 30.3* 27.3* 28.3*  WBC 6.1 5.7 6.6  PLT 68* 67* 66*   Dg Chest Port 1 View  Dg Chest 2 View  09/18/2011  *RADIOLOGY REPORT*  Clinical Data: Shortness of breath.  Follow up CHF and effusions.  CHEST - 2 VIEW 09/18/2011 1210 hours:  Comparison: Portable chest x-ray earlier same date 0526 hours and dating back to 09/14/2011.  Findings: Prior sternotomy for CABG.  Cardiac silhouette enlarged but stable.  Interval improvement in the pulmonary venous hypertension and interstitial pulmonary  edema, though the pulmonary venous hypertension persists.  Stable large bilateral pleural effusions, right greater than left, and associated dense consolidation in the lower lobes.  Right jugular central venous catheter tip remains in the SVC.  No new abnormalities.  IMPRESSION: Improved pulmonary venous hypertension and pulmonary edema since earlier in the day.  Stable large bilateral pleural effusions, right greater than left, and associated dense passive atelectasis and/or pneumonia in the lower lobes.  No new abnormalities.  Original Report Authenticated By: Arnell Sieving, M.D.   Dg Kayleen Memos W/water Sol Cm  09/18/2011  *RADIOLOGY REPORT*  Clinical Data:  Postop from repair of perforated duodenal ulcer. Evaluate for postop anastomotic leak.  UPPER GI SERIES WITHOUT KUB  Technique:  Routine upper GI series was  performed with water- soluble Omnipaque-300.  Fluoroscopy Time: 1.3 minutes  Comparison:  None.  Findings: No evidence of esophageal mass or stricture.  No evidence of hiatal hernia.  Evaluation of the stomach was somewhat limited due to incomplete distention on this single column exam, however there is no evidence of gastric mass or obstruction.  Prompt contrast opacification of the duodenum and proximal small bowel is seen.  A percutaneous G J tube is seen in place, with tip in the distal duodenum near the ligament of Treitz.  Irregular contour the duodenal bulb is seen, however there is no evidence of contrast leak or extravasation.  The duodenum and proximal jejunum are nondilated.  IMPRESSION: Expected postoperative changes from repair of perforated duodenal ulcer.  No evidence of anastomotic leak or other significant abnormality.  Original Report Authenticated By: Danae Orleans, M.D.   Dg Cm Inj Any Colonic Tube W/fluoro  09/18/2011  *RADIOLOGY REPORT*  Clinical Data: Postop from repair of perforated duodenal ulcer. Check G J tube placement and rule out leak at tube site.  CONTRAST INJECTION OF GASTROJEJUNOSTOMY TUBE UNDER FLUOROSCOPY  Fluoroscopy time:  0.2 minutes  Findings: Omnipaque-300 water-soluble contrast was injected through the existing gastrojejunostomy tube under fluoroscopy.  This shows that the G J tube is patent, and is normal in position with the tip in the distal duodenum near the ligament of Treitz. Injected contrast opacifies the gastric antrum and duodenum.  There is no evidence of contrast leak or extravasation from the stomach or along the course of the G J tube.  IMPRESSION: Patent percutaneous G J tube, with tip in distal duodenum near the ligament of Treitz.  No evidence of contrast leak or extravasation.  Original Report Authenticated By: Danae Orleans, M.D.     Assessment and Plan:  Duodenal ulcer    Assessment: s/p open repair 2/13, currently not bleeding, empiric  antibiotics ordered   Plan:  -post op care per surgery -monitor h/h -ppi gtt -Zoysyn ? When to d/c > defer to [facility]    Acute resp failure/ vent dep   Assessment: came from OR intubated,d/t  pain, hypotension successfully extubated 2/16 2/18 No resp distress/ sats ok on 2lpm 2/19 increased wob, add flutter ,IS 2/19 gentle diuresis,having cough with green sputum 2/20 bnp [id number] PLAN  - continue zosyn  - diurese       CHF - DIASTOLIC/ACUTE and also  NS- [facility] 2/19   Assessment: +balance .  ECHO 2./13/13 - normal. But had NS [facility] 2/19 associated with low phos but normal mag and phos repleted 09/18/11.  Lab 09/19/11 0520 09/15/11 0500  PROBNP 14622.0* 1579.0*   Plan:  -diurese starting  09/19/2011 - saline  Lock IV fluids   ELECTROLYTES  Lab 09/20/11 0520 09/19/11 0520 09/18/11 0405 09/17/11 0457 09/15/11 0500  K 3.5 3.6 3.5 3.4* 3.7    PLAn  - replete and monitor   CKD   Lab Results  Component Value Date   CREATININE 1.45* 09/20/2011   CREATININE 1.45* 09/19/2011   CREATININE 1.34 09/18/2011      Assessment: stage III CKD at baseline> improving   Plan:   -renally dose meds - monintor with diuresis  Shock    Assessment: likely due to volume loss from GIB, and post op sedation, does not appear to be in septic shock-Resolved 2/15 with vol exp/transfusion PLAN monitor  Anemia, acute blood loss post op  Lab 09/20/11 0520 09/19/11 0520 09/18/11 0405  HGB 10.5* 9.3* 9.7*   approp response to 2 units 2/15 and drifting up since  prbc for hgb < 7gm% only   Diabetes mellitus (09/13/2011)   Assessment:    Plan:   - floor ssi protocol  Thrombocytopenia  Lab 09/20/11 0520 09/19/11 0520 09/18/11 0405 09/17/11 0457 09/15/11 0500  PLT 68* 67* 66* 69* 60*   8 days LOS on 09/20/2011   This appears to be a 50% drop in platelet count but happened within 48h of starting heparin (s/p heparin 2/14 - 2/16) and no evidence ofheparin in past 20 days on quick chart review. Low  pre test for HIT.  Doppler legs 09/18/11  Negative for DVT  PLAN  -  - Continue SCDs  - If HIT PANEL 09/18/11 - negative then can restart lovenox if plat count > 50K - mobilize  Best practices / Disposition:     Thromboprophylaxis: hep sq > pas 2/16 due to low plt HOB >30 degrees Ulcer prophylaxis: ppi gtt Glucose control/hyperglycemia: icu hyperglyecmia -hit panel pend.   Brett Canales Minor ACNP [facility] PCCM Pager [phone] till 3 pm If no answer page [phone] 09/20/2011, 10:54 AM      STAFF NOTE: I, Dr Lavinia Sharps have personally reviewed patient's available data, including medical history, events of note, physical examination and test results as part of my evaluation. I have discussed with resident/NP and other care providers such as pharmacist, RN and RRT.  In addition,  I personally evaluated patient and elicited key findings of  Post op resp failure and acute diastolic chf. Continue diuresis; this is helping. I have ordered bnp for tomorrow. I have ordered PT consult and Inpatient rehab eval consult. Restart sq heparin for dvt proph when HIT negative. PCCM will sign off.  Rest per NP/medical resident whose note is outlined above and that I agree with    Dr. Kalman Shan, M.D., [facility].C.P Pulmonary and Critical Care Medicine Staff Physician [facility] [place] [facility] Pager: [phone], If no answer or between  15:00h - 7:00h: call [phone]  09/20/2011 3:39 PM

## 2011-09-20 NOTE — Progress Notes (Signed)
Inpatient Diabetes Program Recommendations  AACE/ADA: New Consensus Statement on Inpatient Glycemic Control (2009)  Target Ranges:  Prepandial:   less than 140 mg/dL      Peak postprandial:   less than 180 mg/dL (1-2 hours)      Critically ill patients:  140 - 180 mg/dL   Reason for Visit: Results for [name] [name], [name] [name] (MRN [id number]) as of 09/20/2011 10:39  Ref. Range 09/19/2011 16:44 09/19/2011 20:02 09/20/2011 00:43 09/20/2011 04:46 09/20/2011 07:50  Glucose-Capillary Latest Range: 70-99 mg/dL 865 (H) 784 (H) 696 (H) 210 (H) 235 (H)     CBG's elevated over the past 24 hours.  Patient is on Osmolite 20 cc/hour and "Heart Healthy" diet started today.  According to health history, patient was on Lantus 54 units daily prior to admit.  Had hypoglycemia initially at admission.    Currently patient has 2 Novolog correction scales.  While patient is on tube feeds, consider q 4 hour CBG's and Novolog moderate correction.  Also consider resuming a portion of home Lantus.  May consider Lantus 15 units daily.     Note: Will follow.

## 2011-09-20 NOTE — Progress Notes (Signed)
Nutrition Follow-up  Diet Order:  Heart Healthy  Diet has been advanced to Heart Healthy, pt eating lunch at time of visit.  Pt has been consuming 100% of trays since starting a diet.  Pt seems to enjoy eating and is happy to have an appetite.  He wants to finish his meals even if it takes him extra time.   Still with increased WOB.  Pt also continues Osmolite 1.2 @ 20 mL/hr with Prostat 4 times daily providing 864 kcal, 86g protein, and 393 mL free water.  Pt is consuming well-rounded meals with adequate protein.  Requested extra juice.    Meds: Scheduled Meds:   . antiseptic oral rinse  15 mL Mouth Rinse QID  . chlorhexidine  15 mL Mouth Rinse BID  . feeding supplement (OSMOLITE 1.2 CAL)  1,000 mL Oral Q24H  . feeding supplement  30 mL Per Tube QID  . furosemide  20 mg Intravenous Once  . furosemide  40 mg Intravenous Daily  . insulin aspart  0-4 Units Subcutaneous Q4H  . insulin aspart  0-9 Units Subcutaneous TID WC  . pantoprazole sodium  40 mg Per Tube BID  . piperacillin-tazobactam (ZOSYN)  IV  3.375 g Intravenous Q8H  . potassium chloride  40 mEq Oral Daily  . DISCONTD: pantoprazole (PROTONIX) IV  40 mg Intravenous Q12H   Continuous Infusions:  PRN Meds:.sodium chloride  Labs:  CMP     Component Value Date/Time   NA 138 09/20/2011 0520   K 3.5 09/20/2011 0520   CL 102 09/20/2011 0520   CO2 28 09/20/2011 0520   GLUCOSE 221* 09/20/2011 0520   BUN 22 09/20/2011 0520   CREATININE 1.45* 09/20/2011 0520   CALCIUM 7.8* 09/20/2011 0520   PROT 5.4* 09/13/2011 0345   ALBUMIN 2.2* 09/13/2011 0345   AST 24 09/13/2011 0345   ALT 15 09/13/2011 0345   ALKPHOS 53 09/13/2011 0345   BILITOT 0.3 09/13/2011 0345   GFRNONAA 41* 09/20/2011 0520   GFRAA 48* 09/20/2011 0520     Intake/Output Summary (Last 24 hours) at 09/20/11 1515 Last data filed at 09/20/11 1300  Gross per 24 hour  Intake   1790 ml  Output   1785 ml  Net      5 ml    Weight Status:  Overall stable, started lasix  yesterday  Nutrition Dx:  Inadequate oral intake, resolved  Intervention:   1.  Supplements; will d/c Prostat since pt is ordering adequate protein. 2.  Enteral nutrition; per MD discretion.  Pt is currently eating well and enjoying food.    Monitor:  1.  Food/Beverage; continued adequate intake. 2.  Enteral nutrition; per MD.  Intake has improved.  Hoyt Koch Pager #:  [phone]

## 2011-09-20 NOTE — Progress Notes (Signed)
Physical Therapy Treatment Patient Details Name: [name] [name] MRN: [id number] DOB: Dec 18, 1919 Today's Date: 09/20/2011  PT Assessment/Plan  PT - Assessment/Plan Comments on Treatment Session: Pt requiring increased assistance this session. Fatigues easily.  PT Plan: Discharge plan remains appropriate Follow Up Recommendations: Skilled nursing facility Equipment Recommended: Defer to next venue PT Goals  Acute Rehab PT Goals PT Goal: Supine/Side to Sit - Progress: Progressing toward goal PT Goal: Sit to Stand - Progress: Progressing toward goal PT Goal: Stand to Sit - Progress: Progressing toward goal PT Goal: Ambulate - Progress: Progressing toward goal  PT Treatment Precautions/Restrictions  Precautions Precautions: Fall Restrictions Weight Bearing Restrictions: No Mobility (including Balance) Bed Mobility Bed Mobility: Yes Supine to Sit: 3: Mod assist;HOB elevated (Comment degrees);With rails Supine to Sit Details (indicate cue type and reason): Increased time. VCs safety, technique, hand placement. Assist for trunk to full upright.  Transfers Transfers: Yes Sit to Stand: 3: Mod assist;From elevated surface;From bed;From chair/3-in-1;With armrests Sit to Stand Details (indicate cue type and reason): x3. VCs safety, technique, hand placement. Assist to rise, stabilize. Multiple trials due to pt c/o R LE cramping.  Stand to Sit: 4: Min assist;With upper extremity assist;With armrests;To bed;To chair/3-in-1 Stand to Sit Details: x3. VCs safety, technique, hand placement. Asssit to control descent.  Stand Pivot Transfers: 3: Mod assist Stand Pivot Transfer Details (indicate cue type and reason): VCs safety, technique, posture. Assist to stabilize and negotiate with RW Ambulation/Gait Ambulation/Gait: Yes Ambulation/Gait Assistance: 1: +2 Total assist (Pt=70%) Ambulation/Gait Assistance Details (indicate cue type and reason): VCs safety, posture, distance from RW. Assist to  stabilize and negotiate with RW. Fatigues easily. +2 safety, lines/IVpole/O2 Ambulation Distance (Feet): 10 Feet (forwards and backwards) Assistive device: Rolling walker Gait Pattern: Step-through pattern;Trunk flexed;Decreased step length - right;Decreased step length - left;Decreased stride length    Exercise    End of Session PT - End of Session Equipment Utilized During Treatment: Gait belt (O2) Activity Tolerance: Patient limited by fatigue Patient left: in chair;with call bell in reach Nurse Communication: Mobility status for transfers General Behavior During Session: [facility] for tasks performed Cognition: [facility] for tasks performed  Rebeca Alert [facility] 09/20/2011, 4:28 PM [phone]

## 2011-09-20 NOTE — Progress Notes (Signed)
Patient ID: [name] [name], male   DOB: 03/11/1920, 78 y.o.   MRN: [id number] 8 Days Post-Op  Subjective: Pt feels ok this morning.  Tolerating clear liquids.  +flatus, +BM.  No other complaints.  Objective: Vital signs in last 24 hours: Temp:  [97.6 F (36.4 C)-98.6 F (37 C)] 98.6 F (37 C) (02/21 0448) Pulse Rate:  [64-77] 77  (02/21 0448) Resp:  [18-20] 18  (02/21 0448) BP: (148-158)/(74-76) 158/76 mmHg (02/21 0448) SpO2:  [96 %] 96 % (02/21 0448) Weight:  [233 lb 7.5 oz (105.9 kg)] 233 lb 7.5 oz (105.9 kg) (02/21 0448) Last BM Date: 09/18/11  Intake/Output from previous day: 02/20 0701 - 02/21 0700 In: 2380 [P.O.:1200; IV Piggyback:820] Out: 1085 [Urine:875; Drains:210] Intake/Output this shift:    PE: Abd: soft, appropriately tender, GJ tube in place with trickle TFs, incision c/d/i.  Heart: regular Lungs: CTAB in front, O2 in place  Lab Results:   [facility] 09/20/11 0520 09/19/11 0520  WBC 6.1 5.7  HGB 10.5* 9.3*  HCT 30.3* 27.3*  PLT 68* 67*   BMET  Basename 09/20/11 0520 09/19/11 0520  NA 138 143  K 3.5 3.6  CL 102 110  CO2 28 24  GLUCOSE 221* 134*  BUN 22 17  CREATININE 1.45* 1.45*  CALCIUM 7.8* 7.7*   PT/INR No results found for this basename: LABPROT:2,INR:2 in the last 72 hours   Studies/Results: Dg Chest 2 View  09/18/2011  *RADIOLOGY REPORT*  Clinical Data: Shortness of breath.  Follow up CHF and effusions.  CHEST - 2 VIEW 09/18/2011 1210 hours:  Comparison: Portable chest x-ray earlier same date 0526 hours and dating back to 09/14/2011.  Findings: Prior sternotomy for CABG.  Cardiac silhouette enlarged but stable.  Interval improvement in the pulmonary venous hypertension and interstitial pulmonary edema, though the pulmonary venous hypertension persists.  Stable large bilateral pleural effusions, right greater than left, and associated dense consolidation in the lower lobes.  Right jugular central venous catheter tip remains in the SVC.  No new  abnormalities.  IMPRESSION: Improved pulmonary venous hypertension and pulmonary edema since earlier in the day.  Stable large bilateral pleural effusions, right greater than left, and associated dense passive atelectasis and/or pneumonia in the lower lobes.  No new abnormalities.  Original Report Authenticated By: Arnell Sieving, M.D.   Dg Kayleen Memos W/water Sol Cm  09/18/2011  *RADIOLOGY REPORT*  Clinical Data:  Postop from repair of perforated duodenal ulcer. Evaluate for postop anastomotic leak.  UPPER GI SERIES WITHOUT KUB  Technique:  Routine upper GI series was performed with water- soluble Omnipaque-300.  Fluoroscopy Time: 1.3 minutes  Comparison:  None.  Findings: No evidence of esophageal mass or stricture.  No evidence of hiatal hernia.  Evaluation of the stomach was somewhat limited due to incomplete distention on this single column exam, however there is no evidence of gastric mass or obstruction.  Prompt contrast opacification of the duodenum and proximal small bowel is seen.  A percutaneous G J tube is seen in place, with tip in the distal duodenum near the ligament of Treitz.  Irregular contour the duodenal bulb is seen, however there is no evidence of contrast leak or extravasation.  The duodenum and proximal jejunum are nondilated.  IMPRESSION: Expected postoperative changes from repair of perforated duodenal ulcer.  No evidence of anastomotic leak or other significant abnormality.  Original Report Authenticated By: Danae Orleans, M.D.   Dg Cm Inj Any Colonic Tube W/fluoro  09/18/2011  *RADIOLOGY  REPORT*  Clinical Data: Postop from repair of perforated duodenal ulcer. Check G J tube placement and rule out leak at tube site.  CONTRAST INJECTION OF GASTROJEJUNOSTOMY TUBE UNDER FLUOROSCOPY  Fluoroscopy time:  0.2 minutes  Findings: Omnipaque-300 water-soluble contrast was injected through the existing gastrojejunostomy tube under fluoroscopy.  This shows that the G J tube is patent, and is normal  in position with the tip in the distal duodenum near the ligament of Treitz. Injected contrast opacifies the gastric antrum and duodenum.  There is no evidence of contrast leak or extravasation from the stomach or along the course of the G J tube.  IMPRESSION: Patent percutaneous G J tube, with tip in distal duodenum near the ligament of Treitz.  No evidence of contrast leak or extravasation.  Original Report Authenticated By: Danae Orleans, M.D.    Anti-infectives: Anti-infectives     Start     Dose/Rate Route Frequency Ordered Stop   09/13/11 0800  piperacillin-tazobactam (ZOSYN) IVPB 3.375 g       3.375 g 12.5 mL/hr over 240 Minutes Intravenous Every 8 hours 09/12/11 2221 09/19/11 2000   09/12/11 2300  piperacillin-tazobactam (ZOSYN) IVPB 3.375 g       3.375 g 12.5 mL/hr over 240 Minutes Intravenous NOW 09/12/11 2221 09/13/11 0403   09/12/11 1800   ertapenem (INVANZ) 1 g in sodium chloride 0.9 % 50 mL IVPB  Status:  Discontinued        1 g 100 mL/hr over 30 Minutes Intravenous Every 24 hours 09/12/11 1656 09/12/11 2215           Assessment/Plan  1. S/p repair of perf duo ulcer 2. CHF 3. Thrombocytopenia  Plan: 1. Will advance to a heart healthy diet today.  If he tolerates this, we will stop his trickle TFs tomorrow. 2. His Zosyn was discontinued yesterday. 3. Defer CHF, thrombocytopenia, and other medical issues to PCCM.  We greatly appreciate their help and assistance with this patient's care.   LOS: 8 days    Baelyn Doring E 09/20/2011

## 2011-09-20 NOTE — Progress Notes (Signed)
Agree with above 

## 2011-09-21 ENCOUNTER — Inpatient Hospital Stay (HOSPITAL_COMMUNITY): Payer: Medicare Other

## 2011-09-21 LAB — BASIC METABOLIC PANEL
BUN: 26 mg/dL — ABNORMAL HIGH (ref 6–23)
Calcium: 7.8 mg/dL — ABNORMAL LOW (ref 8.4–10.5)
GFR calc non Af Amer: 44 mL/min — ABNORMAL LOW (ref >90)
Glucose, Bld: 229 mg/dL — ABNORMAL HIGH (ref 70–99)

## 2011-09-21 LAB — TECHNOLOGIST SMEAR REVIEW

## 2011-09-21 LAB — GLUCOSE, CAPILLARY
Glucose-Capillary: 233 mg/dL — ABNORMAL HIGH (ref 70–99)
Glucose-Capillary: 293 mg/dL — ABNORMAL HIGH (ref 70–99)
Glucose-Capillary: 214 mg/dL — ABNORMAL HIGH (ref 70–99)
Glucose-Capillary: 288 mg/dL — ABNORMAL HIGH (ref 70–99)

## 2011-09-21 LAB — PRO B NATRIURETIC PEPTIDE: Pro B Natriuretic peptide (BNP): 3841 pg/mL — ABNORMAL HIGH (ref 0–450)

## 2011-09-21 LAB — CBC
HCT: 29.8 % — ABNORMAL LOW (ref 39.0–52.0)
Hemoglobin: 10.1 g/dL — ABNORMAL LOW (ref 13.0–17.0)
MCH: 34 pg (ref 26.0–34.0)
MCHC: 33.9 g/dL (ref 30.0–36.0)

## 2011-09-21 MED ORDER — ASPIRIN EC 81 MG PO TBEC
81.0000 mg | DELAYED_RELEASE_TABLET | Freq: Every day | ORAL | Status: DC
  Administered 2011-09-21 – 2011-09-24 (×4): 81 mg via ORAL
  Filled 2011-09-21 (×5): qty 1

## 2011-09-21 MED ORDER — INSULIN GLARGINE 100 UNIT/ML ~~LOC~~ SOLN
5.0000 [IU] | Freq: Every day | SUBCUTANEOUS | Status: DC
  Administered 2011-09-21 – 2011-09-23 (×3): 5 [IU] via SUBCUTANEOUS
  Filled 2011-09-21: qty 3

## 2011-09-21 MED ORDER — FUROSEMIDE 40 MG PO TABS
40.0000 mg | ORAL_TABLET | Freq: Every day | ORAL | Status: DC
  Administered 2011-09-21 – 2011-09-22 (×2): 40 mg via ORAL
  Filled 2011-09-21 (×2): qty 1

## 2011-09-21 MED ORDER — SPIRONOLACTONE 25 MG PO TABS
25.0000 mg | ORAL_TABLET | Freq: Two times a day (BID) | ORAL | Status: DC
  Administered 2011-09-21 – 2011-09-24 (×6): 25 mg via ORAL
  Filled 2011-09-21 (×9): qty 1

## 2011-09-21 MED ORDER — LOSARTAN POTASSIUM 50 MG PO TABS
50.0000 mg | ORAL_TABLET | Freq: Every day | ORAL | Status: DC
  Administered 2011-09-21 – 2011-09-24 (×4): 50 mg via ORAL
  Filled 2011-09-21 (×5): qty 1

## 2011-09-21 NOTE — Consult Note (Signed)
[facility]ITALIST CONSULT NOTE  This pleasant 78 year old nursing home resident was admitted to/13/13 with marked abdominal pain, distention, nausea and vomiting, rebound and found to have perforated ulcer. He underwent alert her laparotomy with closure of perforated duodenal ulcer with Cheree Ditto patch and an intraoperative sulfate to gastroduodenoscopy and placement of gastrojejunostomy tube was done.   She was managed by pulmonary medicine until 2/21 as he was intubated on 2/13 until 2/16. For what appears to be acute respiratory failure secondary to decompensated diastolic heart failure- of note patient was also volume resuscitated from 2/13-2/15   BNP 14,622 and he was diuresed  Initially was He was kept on IV Zosyn for intra-abdominal sepsis prophylaxis from 2/13 until 2/21.  He also developed thrombocytopenia during hospitalization, platelet count was 153 which dropped to a low point of 64 today.  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 It appears he might of been transfused over 2/52/60 his hemoglobin dropped to 7.7 but then went up to 9.2.  [facility] hospitalist was consulted today to assume management of his underlying medical illnesses.   Today he feels fine and has been eating well. He cannot remember much of his medical care in the past but does state that he has been seen by Dr. Chilton Si in the past. He cannot recall the last time he see a cardiologist, and is pretty unable to tell me what medications he uses He denies any specific chest pain shortness of breath or other issues at present time. He states he has mild abdominal pain which is well-controlled. He has not been passing much stool, but has passed small amounts of flatus  Recommendations 1. Diastolic heart failure-EF 60-65%-would restart his Lasix 40 mg daily by mouth, spironolactone 25 mg twice daily by mouth. He also likely would benefit from low-dose beta blocker as an outpatient. As he is not on this  now, his outpatient physician can determine if there is need for this. He will benefit from being on an ARB such as losartan 50 mg for prevention of cardiac remodeling purposes. 2. Diabetes mellitus-CBGs have ranged between 293 -234. On discharge would recommend glipizide 2.5 mg every 24 hours he. At present would add Lantus 5 units 2 sliding scale insulin. 3. Coronary artery disease, 3 vessel in 2001, status post elective CABG 02/19/2000-this is stable at present time I would continue his aspirin. He was on Lopressor 25 mg bid, and this can be sorted out with his primary care physician. 4 remote history of osteomyelitis-to follow up with primary care physician 5. Hyperlipidemia-will need an outpatient lipid panel done. 6. Thrombocytopenia-unclear etiology. HIt panel pending-the possible precipitants would include Invanz 2 doses that he received 2/13 (although only 1% risk), Zosyn especially when used in renal insufficiency and patient did present with a BUN and creatinine of 43/2.53-Zosyn was discontinued yesterday and I expect that his thrombocytopenia will resolve.  Would recommend a peripheral smear with CBC and oncology followup if his platelet count does not resolve by Monday. 7. Respiratory failure-we'll attempt to wean oxygen. We'll have him ambulate without oxygen and reassess. Patient may need to be discharged home on the same  The patient otherwise seems very stable and likely can be safely discharged provided his platelet counts are monitored. Thank you for this consult. I will follow in his care for now.  Izaya Netherton,JAI 09/21/2011, 3:18 PM   Medications: Scheduled Meds:   . antiseptic oral rinse  15 mL Mouth Rinse QID  . chlorhexidine  15 mL Mouth Rinse BID  .  furosemide  40 mg Intravenous Daily  . insulin aspart  0-9 Units Subcutaneous TID WC  . pantoprazole sodium  40 mg Per Tube BID  . potassium chloride  40 mEq Oral Daily  . DISCONTD: feeding supplement (OSMOLITE 1.2 CAL)  1,000  mL Oral Q24H  . DISCONTD: feeding supplement  30 mL Per Tube QID  . DISCONTD: insulin aspart  0-4 Units Subcutaneous Q4H   Continuous Infusions:  PRN Meds:.sodium chloride  Objective: Weight change: -3.1 kg (-6 lb 13.3 oz)  Intake/Output Summary (Last 24 hours) at 09/21/11 1518 Last data filed at 09/21/11 1416  Gross per 24 hour  Intake    600 ml  Output   2150 ml  Net  -1550 ml     HEENT alert, oriented Caucasian male. Does express some confusion about his medical care. CHEST no crackle no wheeze no added sound. CARDS S1-S2 no murmur rub or gallop-telemetry = sinus rhythm with PVCs ABD soft bowel sounds heard, we'll not examined however bandages changed this morning pursued NEURO moving all 4 limbs equally SKIN grade 1-2 pitting edema lower extremities.  Lab Results: CBC    Component Value Date/Time   WBC 6.0 09/21/2011 0400   RBC 2.97* 09/21/2011 0400   HGB 10.1* 09/21/2011 0400   HCT 29.8* 09/21/2011 0400   PLT 64* 09/21/2011 0400   MCV 100.3* 09/21/2011 0400   MCH 34.0 09/21/2011 0400   MCHC 33.9 09/21/2011 0400   RDW 14.3 09/21/2011 0400   LYMPHSABS 0.6* 09/12/2011 2230   MONOABS 0.2 09/12/2011 2230   EOSABS 0.0 09/12/2011 2230   BASOSABS 0.0 09/12/2011 2230    BMET    Component Value Date/Time   NA 136 09/21/2011 0400   K 3.7 09/21/2011 0400   CL 99 09/21/2011 0400   CO2 30 09/21/2011 0400   GLUCOSE 229* 09/21/2011 0400   BUN 26* 09/21/2011 0400   CREATININE 1.37* 09/21/2011 0400   CALCIUM 7.8* 09/21/2011 0400   GFRNONAA 44* 09/21/2011 0400   GFRAA 51* 09/21/2011 0400      Studies/Results: Ct Abdomen Pelvis Wo Contrast  09/12/2011  **ADDENDUM** CREATED: 09/12/2011 15:52:02  Critical Value/emergent results were called by telephone at the time of interpretation on 09/12/2011  at 3:50 p.m.  to  Dr. Alto Denver, who verbally acknowledged these results.  **END ADDENDUM** SIGNED BY: Aubery Lapping. Dover, M.D.    09/12/2011  *RADIOLOGY REPORT*  Clinical Data: Upper abdominal pain.  CT  ABDOMEN AND PELVIS WITHOUT CONTRAST  Technique:  Multidetector CT imaging of the abdomen and pelvis was performed following the standard protocol without intravenous contrast.  Comparison: Plain films 09/12/2011  Findings: There are locules of extraluminal free air in the upper abdomen adjacent to the liver edge.  Findings compatible with bowel perforation.  The distal stomach appears abnormal with wall thickening and surrounding stranding/inflammation.  This is concerning for peptic ulcer disease and possibly the source of the perforation.  Small amount of free fluid in the pelvis. There is also perihepatic fluid which is nearly isointense to the liver and difficult to visualize.  This could represent blood or extravasated oral contrast.  No evidence of bowel obstruction.  Scattered colonic diverticula.  No active diverticulitis.  Aorta and iliac vessels are calcified, non-aneurysmal.  There is a small hiatal hernia. No focal lesion in the liver, spleen, pancreas, adrenals or kidneys on this unenhanced study.  Pancreas is atrophic.  Urinary bladder unremarkable.  No acute bony abnormality.  IMPRESSION: Locules of free air and high-density  free fluid in the abdomen. Abnormal pyloric region of the stomach and possibly involving the first portion of the duodenum.  Findings concerning for perforated peptic ulcer disease.  Original Report Authenticated By: Cyndie Chime, M.D.   Dg Chest 2 View  09/18/2011  *RADIOLOGY REPORT*  Clinical Data: Shortness of breath.  Follow up CHF and effusions.  CHEST - 2 VIEW 09/18/2011 1210 hours:  Comparison: Portable chest x-ray earlier same date 0526 hours and dating back to 09/14/2011.  Findings: Prior sternotomy for CABG.  Cardiac silhouette enlarged but stable.  Interval improvement in the pulmonary venous hypertension and interstitial pulmonary edema, though the pulmonary venous hypertension persists.  Stable large bilateral pleural effusions, right greater than left, and  associated dense consolidation in the lower lobes.  Right jugular central venous catheter tip remains in the SVC.  No new abnormalities.  IMPRESSION: Improved pulmonary venous hypertension and pulmonary edema since earlier in the day.  Stable large bilateral pleural effusions, right greater than left, and associated dense passive atelectasis and/or pneumonia in the lower lobes.  No new abnormalities.  Original Report Authenticated By: Arnell Sieving, M.D.   Dg Abd 1 View  09/17/2011  *RADIOLOGY REPORT*  Clinical Data: Jejunostomy tube placement  ABDOMEN - 1 VIEW  Comparison: CT 09/12/2011  Findings: Gastrostomy tube is plate in place overlying the stomach. The surgical note indicated a gastric jejunostomy tube was  placed through the gastrostomy tube however I do not see this tubing.  Two  tubes are present overlying the right abdomen in the subhepatic space and extending in the epigastric area.  These may be  surgical drainage tubes.  Negative for bowel obstruction.  IMPRESSION: Gastrostomy tube is in good position overlying the stomach region. Gastric jejunostomy tube not identified.  Two surgical tubes are present on the right which may be surgical drains.  Original Report Authenticated By: Camelia Phenes, M.D.   Dg Chest Port 1 View  09/18/2011  *RADIOLOGY REPORT*  Clinical Data: Extubation, follow-up  PORTABLE CHEST - 1 VIEW  Comparison: Portable chest x-ray of 09/17/2011  Findings: No endotracheal tube is seen.  The lungs appear slightly better aerated.  Cardiomegaly, bilateral effusions and pulmonary vascular congestion remain.  A right IJ central venous line remains.  Median sternotomy sutures are noted.  IMPRESSION: Minimally improved aeration.  Little change in probable CHF with cardiomegaly, pulmonary vascular congestion, and effusions.  Original Report Authenticated By: Juline Patch, M.D.   Dg Chest Port 1 View  09/18/2011  *RADIOLOGY REPORT*  Clinical Data: Shortness of breath and  wheezing; question of stridor.  PORTABLE CHEST - 1 VIEW  Comparison: Chest radiograph performed earlier today at 04:50 a.m.  Findings: The lungs are well-aerated.  There are persistent small bilateral pleural effusions, similar in appearance to the prior study, with hazy bilateral airspace opacity, worse on the right. As before, this most likely reflects pulmonary edema, right worse than left.  Underlying vascular congestion is noted.  No pneumothorax is seen.  The cardiomediastinal silhouette is mildly enlarged; the patient is status post sternotomy, with evidence of prior CABG.  A right IJ line is noted ending about the mid SVC.  No acute osseous abnormalities are seen; chronic bony remodelling and resorption are noted at the distal right clavicle.  IMPRESSION: Stable appearance to pulmonary edema, right worse than left, with small bilateral pleural effusions; underlying vascular congestion and mild cardiomegaly noted.  Original Report Authenticated By: Tonia Ghent, M.D.   Dg Chest Port 1  View  09/17/2011  *RADIOLOGY REPORT*  Clinical Data: Atelectasis.  PORTABLE CHEST - 1 VIEW  Comparison: 09/15/2011.  Findings: Right IJ central line projects in the SVC.  The patient has been extubated in the interval.  Trachea is midline.  Heart size stable.  There are bilateral pleural effusions with mixed interstitial and airspace disease, basilar dependent.  IMPRESSION: Probable congestive heart failure, stable.  Original Report Authenticated By: Reyes Ivan, M.D.   Dg Chest Port 1 View  09/15/2011  *RADIOLOGY REPORT*  Clinical Data: Endotracheal tube  PORTABLE CHEST - 1 VIEW  Comparison: 09/14/2011  Findings: Hypoaerated, results in interstitial and vascular crowding.  Right IJ catheter tip projects over the distal SVC. Endotracheal tube tip projects 5.6 cm proximal to the carina. Right greater than left lung base opacities.  No pneumothorax. Status post median sternotomy and CABG.  No acute osseous  abnormality.  IMPRESSION: Endotracheal tube tip projects 5.6 cm proximal to the carina.  Status post median sternotomy and CABG.  Bibasilar opacities; atelectasis versus infiltrate.  Original Report Authenticated By: Waneta Martins, M.D.   Dg Chest Port 1 View  09/14/2011  *RADIOLOGY REPORT*  Clinical Data: Intubated patient of  PORTABLE CHEST - 1 VIEW  Comparison: 09/13/2011; 09/11/1937; 01/23/2008  Findings:  Grossly unchanged enlarged cardiac silhouette and mediastinal contours with atherosclerotic calcifications of the thoracic aorta. Post median sternotomy and CABG. Endotracheal tube overlies tracheal air column with tips through the carina.  Interval placement of enteric tube with tip overlying the superior/mid esophagus.  Unchanged positioning of right jugular approach central venous catheter with tip overlying the superior cavoatrial junction.  Mild cephalization of flow without frank evidence of pulmonary edema.  There is blunting of bilateral costophrenic angles and grossly unchanged bibasilar heterogeneous / consolidative opacities, left greater than right.  No definite pneumothorax.  Unchanged bones.  IMPRESSION: 1.  Interval placement of enteric tube with tip over the superior/mid esophagus.  Repositioning and repeat radiograph is recommended. 2.  Otherwise, stable position of support apparatus.  No pneumothorax. 3.  Grossly unchanged low lung volumes, small effusions and bibasilar opacities, atelectasis versus infiltrate.  This is made call report.  Original Report Authenticated By: Waynard Reeds, M.D.   Dg Chest Port 1 View  09/13/2011  *RADIOLOGY REPORT*  Clinical Data: Line placement  PORTABLE CHEST - 1 VIEW  Comparison: 09/12/2011  Findings: Stable postoperative changes in the mediastinum. Endotracheal tube with tip about 5.3 cm above the carina.  Interval placement of right central venous catheter with tip over the mid SVC region.  No pneumothorax.  Shallow inspiration with atelectasis  in the lung bases.  Mild cardiac enlargement.  Degenerative changes in the spine and shoulders.  Old fracture deformity of the left clavicle.  IMPRESSION: Appliances in satisfactory apparent location.  Shallow inspiration with basilar atelectasis.  Original Report Authenticated By: Marlon Pel, M.D.   Dg Chest Port 1 View  09/12/2011  *RADIOLOGY REPORT*  Clinical Data: Endotracheal tube placement  PORTABLE CHEST - 1 VIEW  Comparison: 01/23/2008  Findings: Endotracheal tube was placed with tip about 4.5 cm above the carina.  Postoperative changes in the mediastinum with sternotomy wires and surgical clips and vascular markers present. Shallow inspiration.  Linear atelectasis in the lung bases and right midlung.  No apparent pneumothorax.  Calcified and tortuous aorta.  Degenerative changes in the shoulders.  IMPRESSION: Endotracheal tube tip is about 4.5 cm above the carina.  Shallow inspiration with basilar infiltration or atelectasis bilaterally.  Original Report Authenticated By: Marlon Pel, M.D.   Dg Abd Acute W/chest  09/12/2011  *RADIOLOGY REPORT*  Clinical Data: Abdominal pain and distention.  Bilateral lower extremity swelling.  Constipation.  ACUTE ABDOMEN SERIES (ABDOMEN 2 VIEW & CHEST 1 VIEW) 09/12/2011:  Comparison: No prior abdomen x-ray.  Two-view chest x-ray 01/23/2008 [facility].  Findings: Bowel gas pattern unremarkable without evidence of obstruction or significant ileus.  No evidence of free intraperitoneal air on the erect image.  Air-fluid levels in the stomach and in the colon.  Moderate stool burden involving the descending colon, sigmoid colon, and rectum.  No visible opaque urinary tract calculi.  Phlebolith low in the right side of the pelvis.  Degenerative changes involving the lumbar spine.  Prior sternotomy for CABG.  Cardiac silhouette mildly enlarged but stable.  Bilateral pleural effusions, left greater than right, with associated mild passive  atelectasis in the lower lobes.  Lungs otherwise clear.  Pulmonary vascularity normal without evidence of pulmonary edema.  IMPRESSION:  1.  No acute abdominal abnormality.  Moderate stool burden. 2.  Stable cardiomegaly without pulmonary edema.  Bilateral pleural effusions, left greater than right, and associated passive atelectasis in the lower lobes.  Original Report Authenticated By: Arnell Sieving, M.D.   Dg Kayleen Memos W/water Sol Cm  09/18/2011  *RADIOLOGY REPORT*  Clinical Data:  Postop from repair of perforated duodenal ulcer. Evaluate for postop anastomotic leak.  UPPER GI SERIES WITHOUT KUB  Technique:  Routine upper GI series was performed with water- soluble Omnipaque-300.  Fluoroscopy Time: 1.3 minutes  Comparison:  None.  Findings: No evidence of esophageal mass or stricture.  No evidence of hiatal hernia.  Evaluation of the stomach was somewhat limited due to incomplete distention on this single column exam, however there is no evidence of gastric mass or obstruction.  Prompt contrast opacification of the duodenum and proximal small bowel is seen.  A percutaneous G J tube is seen in place, with tip in the distal duodenum near the ligament of Treitz.  Irregular contour the duodenal bulb is seen, however there is no evidence of contrast leak or extravasation.  The duodenum and proximal jejunum are nondilated.  IMPRESSION: Expected postoperative changes from repair of perforated duodenal ulcer.  No evidence of anastomotic leak or other significant abnormality.  Original Report Authenticated By: Danae Orleans, M.D.   Dg Cm Inj Any Colonic Tube W/fluoro  09/18/2011  *RADIOLOGY REPORT*  Clinical Data: Postop from repair of perforated duodenal ulcer. Check G J tube placement and rule out leak at tube site.  CONTRAST INJECTION OF GASTROJEJUNOSTOMY TUBE UNDER FLUOROSCOPY  Fluoroscopy time:  0.2 minutes  Findings: Omnipaque-300 water-soluble contrast was injected through the existing gastrojejunostomy tube  under fluoroscopy.  This shows that the G J tube is patent, and is normal in position with the tip in the distal duodenum near the ligament of Treitz. Injected contrast opacifies the gastric antrum and duodenum.  There is no evidence of contrast leak or extravasation from the stomach or along the course of the G J tube.  IMPRESSION: Patent percutaneous G J tube, with tip in distal duodenum near the ligament of Treitz.  No evidence of contrast leak or extravasation.  Original Report Authenticated By: Danae Orleans, M.D.

## 2011-09-21 NOTE — Progress Notes (Signed)
Patient  ambulate with walker with  2 techs  assisting from bed  when patient just suddenly  fell  by the door, 1 tech able to hold the patient and assisted him on the floor. Patient was talking when it happened , didn't complained of any dizziness, headaches or any discomfort prior to the incident.  Vital signs checked, placed back on bed thru the lift equipment. Patient did not complained of any pain  or discomfort after the incident, no changes in mental status noted. patient stated that "my legs just suddenly feel weak".  MD was called and notified by the charge nurse, wife was also made aware.

## 2011-09-21 NOTE — Progress Notes (Signed)
CSW assisting with D/C planning. [facility] will have a SNF bed available for this pt when stable. ( SNF unable to accept pt over the week end ). CSW will follow to assist with d/c planning.

## 2011-09-21 NOTE — Progress Notes (Signed)
Agree with above, dc planning

## 2011-09-21 NOTE — Progress Notes (Signed)
Patient ID: [name] [name], male   DOB: [birth year], 78 y.o.   MRN: [id number] 9 Days Post-Op  Subjective: Pt feels well today.  A little SOB due to slumping in chair.  Otherwise no complaints, he wants to get up and ambulate.  Objective: Vital signs in last 24 hours: Temp:  [97.9 F (36.6 C)-98.6 F (37 C)] 98.3 F (36.8 C) (02/22 0535) Pulse Rate:  [68-72] 68  (02/22 0535) Resp:  [20-22] 20  (02/22 0535) BP: (126-141)/(72-85) 128/75 mmHg (02/22 0535) SpO2:  [91 %-97 %] 97 % (02/22 0535) Weight:  [226 lb 10.1 oz (102.8 kg)] 226 lb 10.1 oz (102.8 kg) (02/22 0500) Last BM Date: 09/18/11  Intake/Output from previous day: 02/21 0701 - 02/22 0700 In: 1180 [P.O.:940] Out: 1800 [Urine:1750; Drains:50] Intake/Output this shift:    PE: Abd: soft, nontender, G-J tube in place, incision clean. Lungs: slight wheeze today  Lab Results:   Basename 09/21/11 0400 09/20/11 0520  WBC 6.0 6.1  HGB 10.1* 10.5*  HCT 29.8* 30.3*  PLT 64* 68*   BMET  Basename 09/21/11 0400 09/20/11 0520  NA 136 138  K 3.7 3.5  CL 99 102  CO2 30 28  GLUCOSE 229* 221*  BUN 26* 22  CREATININE 1.37* 1.45*  CALCIUM 7.8* 7.8*   PT/INR No results found for this basename: LABPROT:2,INR:2 in the last 72 hours   Studies/Results: No results found.  Anti-infectives: Anti-infectives     Start     Dose/Rate Route Frequency Ordered Stop   09/13/11 0800  piperacillin-tazobactam (ZOSYN) IVPB 3.375 g       3.375 g 12.5 mL/hr over 240 Minutes Intravenous Every 8 hours 09/12/11 2221 09/19/11 2000   09/12/11 2300  piperacillin-tazobactam (ZOSYN) IVPB 3.375 g       3.375 g 12.5 mL/hr over 240 Minutes Intravenous NOW 09/12/11 2221 09/13/11 0403   09/12/11 1800   ertapenem (INVANZ) 1 g in sodium chloride 0.9 % 50 mL IVPB  Status:  Discontinued        1 g 100 mL/hr over 30 Minutes Intravenous Every 24 hours 09/12/11 1656 09/12/11 2215           Assessment/Plan  1. perf duodenal ulcer, s/p repair 2.  Deconditioning 3. Thrombocytopenia, HIT panel pending 4. DM 5. CHF 6. CKD  Plan: 1. Will stop TF today as patient is tolerating his regular diet without difficulties. 2. D/c foley and apply condom cath as needed while still getting lasix. 3. Per PCCM, once HIT panel back, if negative can start SQ Heparin. 4. Will ask the hospitalist to assist in the patient's care now that PCCM has signed off.  We appreciate all their assistance and help. 5. Will defer CHF treatment to medicine  LOS: 9 days    Taye Cato E 09/21/2011

## 2011-09-22 DIAGNOSIS — D696 Thrombocytopenia, unspecified: Secondary | ICD-10-CM

## 2011-09-22 DIAGNOSIS — N189 Chronic kidney disease, unspecified: Secondary | ICD-10-CM

## 2011-09-22 DIAGNOSIS — F0390 Unspecified dementia without behavioral disturbance: Secondary | ICD-10-CM

## 2011-09-22 LAB — CBC
Hemoglobin: 10.3 g/dL — ABNORMAL LOW (ref 13.0–17.0)
MCHC: 34.9 g/dL (ref 30.0–36.0)
RBC: 2.97 MIL/uL — ABNORMAL LOW (ref 4.22–5.81)

## 2011-09-22 MED ORDER — FUROSEMIDE 40 MG PO TABS
60.0000 mg | ORAL_TABLET | Freq: Every day | ORAL | Status: DC
  Administered 2011-09-23 – 2011-09-24 (×2): 60 mg via ORAL
  Filled 2011-09-22 (×3): qty 1

## 2011-09-22 NOTE — Consult Note (Signed)
NAMESHANAN, MCMILLER NO.:  [id number]  MEDICAL RECORD NO.:  [id number]  LOCATION:  1445                         FACILITY:  [facility]  PHYSICIAN:  Josph Macho, M.D.  DATE OF BIRTH:  1920/01/31  DATE OF CONSULTATION: DATE OF DISCHARGE:                                CONSULTATION   REFERRING PHYSICIAN:  Pleas Koch, MD  REASON FOR CONSULTATION:  Thrombocytopenia - likely iatrogenic.  HISTORY OF PRESENT ILLNESS:  [name] [name] is an 78 year old, white gentleman.  He has history of diabetes.  He also has a history of coronary artery disease and is status post bypass.  He has history of hypertension.  He was admitted on the September 12, 2011, with a perforated duodenal ulcer.  He had been doing well prior to that.  He does have a chronic renal insufficiency.  On admission, his platelet count was 151.  Hemoglobin 10.8 and white cell count was 13.8.  He subsequently was taken to surgery.  This was done by Dr. Biagio Quint.  He underwent exploratory laparotomy.  He had upper endoscopy and gastrostomy.  There is a closure of a duodenal ulcer.  He got through the procedure okay.  There did not appear to be significant blood loss as far as I can tell.  He had a gastrostomy tube and a jejunostomy tube placed.  He was taken to the ICU.  He was placed on antibiotics. He then was found to have developed thrombocytopenia.  Of note, his platelet count on September 13, 2011, was 153.  On September 14, 2011, it was 22.  It then dropped down to 60 on the September 15, 2011.  It had been stable since then.  Today, his CBC shows white count of 6.7, hemoglobin 10.3, hematocrit 29.5, platelet count 65.  He did have a heparin-induced thrombocytopenia studies done.  These were negative.  He had a normal prothrombin time.  His electrolytes, all remained fairly stable.  He does have some markedly elevated pro-beta natriuretic peptide.  We were asked to see [name] [name] and to see anything  that we could do to try to help out his thrombocytopenia.  Again, he has been on quite a few medications.  Currently, he is on low- dose aspirin.  He was on Zosyn.  This I think was given for maybe 3 or 4 days.  There is no evidence of any type of sepsis.  He did have an elevated H pylori antigen.  Again, he does have some dementia.  He really could not give me too much in way of his past history.  PAST MEDICAL HISTORY:  Remarkable for: 1. Insulin-dependent diabetes. 2. Coronary artery disease. 3. Hypertension. 4. Renal insufficiency. 5. Hyperlipidemia. 6. Sleep apnea. 7. History of congestive heart failure.  ALLERGIES:  ENALAPRIL.  CURRENT MEDICATIONS: 1. Aspirin 81 mg p.o. daily. 2. Glucotrol XL 2.5 mg p.o. q. day. 3. Lasix 40 mg p.o. q. day. 4. Lantus insulin 54 units nightly. 5. Cozaar 50 mg p.o. q. day. 6. Bactroban, apply to skin daily. 7. Aldactone 25 mg p.o. b.i.d. 8. Protonix 40 mg p.o. q. day.  SOCIAL HISTORY:  Negative for tobacco use.  There is no alcohol  use.  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  FAMILY HISTORY:  Noncontributory.  REVIEW OF SYSTEMS:  As stated in the history of present illness.  PHYSICAL EXAMINATION:  GENERAL:  This is a fairly well-developed, well- nourished white gentleman in no obvious distress. VITAL SIGNS:  Shows temperature of 99.5, pulse 72, respiratory rate 16, blood pressure 136/67.  His O2 saturation 97%. HEENT:  Head and neck exam shows normocephalic, atraumatic skull.  There are no ocular or oral lesions.  There are no palpable cervical, supraclavicular lymph nodes. LUNGS:  Clear bilaterally.  CARDIAC:  Regular rate and rhythm with normal S1, S2.  There are no murmurs, rubs, or bruits. ABDOMEN:  Soft.  He has a laparotomy wound that is dressed.  He has had a J-tube and G-tube coming out of his abdomen without obvious erythema or exudate.  There is really no tenderness to palpation.  There is no palpable  splenomegaly. EXTREMITIES:  Shows diabetic skin changes in his lower legs.  He has some 1+ edema in his lower legs.  There is no erythema with his joints. Good range of motion of his joints. SKIN:  No petechiae.  There are no ecchymoses. NEUROLOGIC:  Shows no focal neurological deficits.  LABORATORY DATA:  His peripheral blood smear shows a normochromic, normocytic population of red blood cells.  There is no nuclear red blood cells.  I see no schistocytes.  There is no rouleaux formation.  There are no target cells.  I see no polychromasia.  His white cells appear normal morphology and maturations.  There is no immature myeloid or lymphoid forms.  There is no hypersegmented polys.  I see no blasts. Platelets are decreased in number.  Platelets are large.  Platelets well granulated.  IMPRESSION:  [name] [name] is an 78 year old gentleman, who has iatrogenic thrombocytopenia.  I have a belief that this is from one of his medications that he received.  His heparin-induced thrombocytopenia assay was negative.  As such, I do not believe that is the etiology.  He was transfused with blood on September 14, 2011.  I suppose that this could be an allogeneic type of thrombocytopenia.  Typically, platelet counts were often a lot lower with transfusion related thrombocytopenia.  I do not see that anything needs to be done right now.  He does not need to have a bone marrow test done.  I believe that his platelets will correct themselves over time.  I do not see any evidence of sepsis.  The fact that he has large platelets typically indicates that this is a destructive or consumptive process.  His bone marrow clearly is making platelets adequately, since that they are being taking out circulation more quickly.  I believe that the baby aspirin is okay for him.  I do not see any problems with him being on a low-dose Lovenox.  I think his platelets just needed to be followed for right now.  We will  follow along and help in any way that we can.     Josph Macho, M.D.     PRE/MEDQ  D:  09/22/2011  T:  09/22/2011  Job:  [id number]

## 2011-09-22 NOTE — Progress Notes (Signed)
Patient ID: [name] [name], male   DOB: Jan 05, 1920, 78 y.o.   MRN: [id number] 10 Days Post-Op  Subjective: Pt alert without complaints.  States he is eating without difficulty, no pain  Objective: Vital signs in last 24 hours: Temp:  [97.9 F (36.6 C)-98.3 F (36.8 C)] 98.1 F (36.7 C) (02/23 0607) Pulse Rate:  [63-76] 76  (02/23 0607) Resp:  [18-20] 20  (02/23 0607) BP: (123-151)/(68-76) 123/71 mmHg (02/23 0607) SpO2:  [92 %-99 %] 92 % (02/23 0607) Weight:  [234 lb 2.1 oz (106.2 kg)] 234 lb 2.1 oz (106.2 kg) (02/23 0500) Last BM Date: 09/21/11  Intake/Output from previous day: 02/22 0701 - 02/23 0700 In: 360 [P.O.:360] Out: 2415 [Urine:2350; Drains:65] Intake/Output this shift: Total I/O In: 15 [Other:15] Out: -   General appearance: alert and no distress GI: normal findings: soft, non-tender Incision/Wound:Dressing clean and dry  Lab Results:   Basename 09/22/11 0645 09/21/11 0400  WBC 6.7 6.0  HGB 10.3* 10.1*  HCT 29.5* 29.8*  PLT 65* 64*   BMET  Basename 09/21/11 0400 09/20/11 0520  NA 136 138  K 3.7 3.5  CL 99 102  CO2 30 28  GLUCOSE 229* 221*  BUN 26* 22  CREATININE 1.37* 1.45*  CALCIUM 7.8* 7.8*   HIT panel neg  Studies/Results: Dg Chest 2 View  09/21/2011  *RADIOLOGY REPORT*  Clinical Data: Short of breath.  Congestive heart failure.  CHEST - 2 VIEW  Comparison: 09/18/2011.  Findings: Right IJ central line unchanged with the tip in the mid SVC.  Small to moderate bilateral pleural effusions are present. CABG.  Cardiomegaly.  Basilar atelectasis.  Pulmonary vascular congestion.  Mild interstitial pulmonary edema.  IMPRESSION: Mild CHF with cardiomegaly and pulmonary vascular congestion. Small to moderate bilateral pleural effusions with associated atelectasis.  Effusions appear little changed compared to prior.  Original Report Authenticated By: Andreas Newport, M.D.    Anti-infectives: Anti-infectives     Start     Dose/Rate Route Frequency Ordered  Stop   09/13/11 0800  piperacillin-tazobactam (ZOSYN) IVPB 3.375 g       3.375 g 12.5 mL/hr over 240 Minutes Intravenous Every 8 hours 09/12/11 2221 09/19/11 2000   09/12/11 2300  piperacillin-tazobactam (ZOSYN) IVPB 3.375 g       3.375 g 12.5 mL/hr over 240 Minutes Intravenous NOW 09/12/11 2221 09/13/11 0403   09/12/11 1800   ertapenem (INVANZ) 1 g in sodium chloride 0.9 % 50 mL IVPB  Status:  Discontinued        1 g 100 mL/hr over 30 Minutes Intravenous Every 24 hours 09/12/11 1656 09/12/11 2215          Assessment/Plan: s/p Procedure(s): EXPLORATORY LAPAROTOMY UPPER GI ENDOSCOPY GASTROSTOMY No apparent post op complications.  Medicine now managing multiple medical problems.   LOS: 10 days    Tinya Cadogan T 09/22/2011

## 2011-09-22 NOTE — Consult Note (Addendum)
[facility] CONSULT NOTE  This pleasant 77 year old nursing home resident was admitted to/13/13 with marked abdominal pain, distention, nausea and vomiting, rebound and found to have perforated ulcer. He underwent alert her laparotomy with closure of perforated duodenal ulcer with Cheree Ditto patch and an intraoperative sulfate to gastroduodenoscopy and placement of gastrojejunostomy tube was done.   She was managed by pulmonary medicine until 2/21 as he was intubated on 2/13 until 2/16. For what appears to be acute respiratory failure secondary to decompensated diastolic heart failure- of note patient was also volume resuscitated from 2/13-2/15   BNP 14,622 and he was diuresed  Initially was He was kept on IV Zosyn for intra-abdominal sepsis prophylaxis from 2/13 until 2/21.  He also developed thrombocytopenia during hospitalization, platelet count was 153 which dropped to a low point of 64 today.  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 It appears he might of been transfused over 2/52/60 his hemoglobin dropped to 7.7 but then went up to 9.2.  [facility] hospitalist was consulted today to assume management of his underlying medical illnesses.    He denies any specific chest pain shortness of breath or other issues at present time. He states he has mild abdominal pain which is well-controlled. He has not been passing much stool, but has passed small amounts of flatus  Recommendations 1. Diastolic heart failure-EF 60-65%-would restart his Lasix 40--> increased to 60 mg daily by mouth, spironolactone 25 mg twice daily by mouth. He also likely would benefit from low-dose beta blocker as an outpatient. Marland Kitchen He will benefit from being on an ARB such as losartan 50 mg for prevention of cardiac remodeling purposes. 2. Diabetes mellitus-CBGs have ranged between 299 -199. On discharge would recommend glipizide 2.5 mg every 24 hours. At present would increase Lantus 10 units + sliding  scale insulin.  I will get an HbA1c if this is above 8.0 he probably will need insulin and insulin teaching 3. Coronary artery disease, 3 vessel in 2001, status post elective CABG 02/19/2000-this is stable at present time I would continue his aspirin. He was on Lopressor 25 mg bid in the past, and not on it now.   This can be sorted out with his primary care physician. 4 remote history of osteomyelitis-to follow up with primary care physician 5. Hyperlipidemia-will need an outpatient lipid panel done. 6. Thrombocytopenia-unclear etiology. HIt panel negative- precipitants would include Invanz 2 doses that he received 2/13 (although only 1% risk), Zosyn ( in renal insufficiency) and patient did present with a BUN and creatinine of 43/2.53-Zosyn was discontinued 2/2. Peripheral smear showed toxic granulation and large platelets-just above thrombocytopenia secondary to platelet destruction either ITP, or drug induced as per above. Have called to request hematology to weigh in. 7. Respiratory failure-we'll attempt to wean oxygen. We'll have him ambulate without oxygen and reassess. Patient may need to be discharged home on the same  The patient otherwise seems very stable.  Minor adjustments made to his insulin/diuretics. Consulted hematology. Will follow along for now. [name] [name],JAI 09/22/2011, 3:40 PM   Medications: Scheduled Meds:    . antiseptic oral rinse  15 mL Mouth Rinse QID  . aspirin EC  81 mg Oral Daily  . chlorhexidine  15 mL Mouth Rinse BID  . furosemide  40 mg Oral Daily  . insulin aspart  0-9 Units Subcutaneous TID WC  . insulin glargine  5 Units Subcutaneous QHS  . losartan  50 mg Oral Daily  . pantoprazole sodium  40 mg Per Tube  BID  . potassium chloride  40 mEq Oral Daily  . spironolactone  25 mg Oral BID  . DISCONTD: furosemide  40 mg Intravenous Daily   Continuous Infusions:  PRN Meds:.sodium chloride  Objective: Weight change: 3.4 kg (7 lb 7.9 oz)  Intake/Output Summary  (Last 24 hours) at 09/22/11 1540 Last data filed at 09/22/11 1300  Gross per 24 hour  Intake    255 ml  Output   1715 ml  Net  -1460 ml     HEENT alert, oriented Caucasian male. Does express some confusion about his medical care. CHEST no crackle no wheeze no added sound. CARDS S1-S2 no murmur rub or gallop-telemetry = sinus rhythm with PVCs ABD soft bowel sounds heard, we'll not examined however bandages changed this morning pursued NEURO moving all 4 limbs equally SKIN grade 1-2 pitting edema lower extremities.  Lab Results: CBC    Component Value Date/Time   WBC 6.7 09/22/2011 0645   RBC 2.97* 09/22/2011 0645   HGB 10.3* 09/22/2011 0645   HCT 29.5* 09/22/2011 0645   PLT 65* 09/22/2011 0645   MCV 99.3 09/22/2011 0645   MCH 34.7* 09/22/2011 0645   MCHC 34.9 09/22/2011 0645   RDW 14.1 09/22/2011 0645   LYMPHSABS 0.6* 09/12/2011 2230   MONOABS 0.2 09/12/2011 2230   EOSABS 0.0 09/12/2011 2230   BASOSABS 0.0 09/12/2011 2230    BMET    Component Value Date/Time   NA 136 09/21/2011 0400   K 3.7 09/21/2011 0400   CL 99 09/21/2011 0400   CO2 30 09/21/2011 0400   GLUCOSE 229* 09/21/2011 0400   BUN 26* 09/21/2011 0400   CREATININE 1.37* 09/21/2011 0400   CALCIUM 7.8* 09/21/2011 0400   GFRNONAA 44* 09/21/2011 0400   GFRAA 51* 09/21/2011 0400      Studies/Results: Ct Abdomen Pelvis Wo Contrast  09/12/2011  **ADDENDUM** CREATED: 09/12/2011 15:52:02  Critical Value/emergent results were called by telephone at the time of interpretation on 09/12/2011  at 3:50 p.m.  to  Dr. Alto Denver, who verbally acknowledged these results.  **END ADDENDUM** SIGNED BY: Aubery Lapping. Dover, M.D.    09/12/2011  *RADIOLOGY REPORT*  Clinical Data: Upper abdominal pain.  CT ABDOMEN AND PELVIS WITHOUT CONTRAST  Technique:  Multidetector CT imaging of the abdomen and pelvis was performed following the standard protocol without intravenous contrast.  Comparison: Plain films 09/12/2011  Findings: There are locules of extraluminal free  air in the upper abdomen adjacent to the liver edge.  Findings compatible with bowel perforation.  The distal stomach appears abnormal with wall thickening and surrounding stranding/inflammation.  This is concerning for peptic ulcer disease and possibly the source of the perforation.  Small amount of free fluid in the pelvis. There is also perihepatic fluid which is nearly isointense to the liver and difficult to visualize.  This could represent blood or extravasated oral contrast.  No evidence of bowel obstruction.  Scattered colonic diverticula.  No active diverticulitis.  Aorta and iliac vessels are calcified, non-aneurysmal.  There is a small hiatal hernia. No focal lesion in the liver, spleen, pancreas, adrenals or kidneys on this unenhanced study.  Pancreas is atrophic.  Urinary bladder unremarkable.  No acute bony abnormality.  IMPRESSION: Locules of free air and high-density free fluid in the abdomen. Abnormal pyloric region of the stomach and possibly involving the first portion of the duodenum.  Findings concerning for perforated peptic ulcer disease.  Original Report Authenticated By: Cyndie Chime, M.D.   Dg Chest 2  View  09/18/2011  *RADIOLOGY REPORT*  Clinical Data: Shortness of breath.  Follow up CHF and effusions.  CHEST - 2 VIEW 09/18/2011 1210 hours:  Comparison: Portable chest x-ray earlier same date 0526 hours and dating back to 09/14/2011.  Findings: Prior sternotomy for CABG.  Cardiac silhouette enlarged but stable.  Interval improvement in the pulmonary venous hypertension and interstitial pulmonary edema, though the pulmonary venous hypertension persists.  Stable large bilateral pleural effusions, right greater than left, and associated dense consolidation in the lower lobes.  Right jugular central venous catheter tip remains in the SVC.  No new abnormalities.  IMPRESSION: Improved pulmonary venous hypertension and pulmonary edema since earlier in the day.  Stable large bilateral pleural  effusions, right greater than left, and associated dense passive atelectasis and/or pneumonia in the lower lobes.  No new abnormalities.  Original Report Authenticated By: Arnell Sieving, M.D.   Dg Abd 1 View  09/17/2011  *RADIOLOGY REPORT*  Clinical Data: Jejunostomy tube placement  ABDOMEN - 1 VIEW  Comparison: CT 09/12/2011  Findings: Gastrostomy tube is plate in place overlying the stomach. The surgical note indicated a gastric jejunostomy tube was  placed through the gastrostomy tube however I do not see this tubing.  Two  tubes are present overlying the right abdomen in the subhepatic space and extending in the epigastric area.  These may be  surgical drainage tubes.  Negative for bowel obstruction.  IMPRESSION: Gastrostomy tube is in good position overlying the stomach region. Gastric jejunostomy tube not identified.  Two surgical tubes are present on the right which may be surgical drains.  Original Report Authenticated By: Camelia Phenes, M.D.   Dg Chest Port 1 View  09/18/2011  *RADIOLOGY REPORT*  Clinical Data: Extubation, follow-up  PORTABLE CHEST - 1 VIEW  Comparison: Portable chest x-ray of 09/17/2011  Findings: No endotracheal tube is seen.  The lungs appear slightly better aerated.  Cardiomegaly, bilateral effusions and pulmonary vascular congestion remain.  A right IJ central venous line remains.  Median sternotomy sutures are noted.  IMPRESSION: Minimally improved aeration.  Little change in probable CHF with cardiomegaly, pulmonary vascular congestion, and effusions.  Original Report Authenticated By: Juline Patch, M.D.   Dg Chest Port 1 View  09/18/2011  *RADIOLOGY REPORT*  Clinical Data: Shortness of breath and wheezing; question of stridor.  PORTABLE CHEST - 1 VIEW  Comparison: Chest radiograph performed earlier today at 04:50 a.m.  Findings: The lungs are well-aerated.  There are persistent small bilateral pleural effusions, similar in appearance to the prior study, with hazy  bilateral airspace opacity, worse on the right. As before, this most likely reflects pulmonary edema, right worse than left.  Underlying vascular congestion is noted.  No pneumothorax is seen.  The cardiomediastinal silhouette is mildly enlarged; the patient is status post sternotomy, with evidence of prior CABG.  A right IJ line is noted ending about the mid SVC.  No acute osseous abnormalities are seen; chronic bony remodelling and resorption are noted at the distal right clavicle.  IMPRESSION: Stable appearance to pulmonary edema, right worse than left, with small bilateral pleural effusions; underlying vascular congestion and mild cardiomegaly noted.  Original Report Authenticated By: Tonia Ghent, M.D.   Dg Chest Port 1 View  09/17/2011  *RADIOLOGY REPORT*  Clinical Data: Atelectasis.  PORTABLE CHEST - 1 VIEW  Comparison: 09/15/2011.  Findings: Right IJ central line projects in the SVC.  The patient has been extubated in the interval.  Trachea is midline.  Heart size stable.  There are bilateral pleural effusions with mixed interstitial and airspace disease, basilar dependent.  IMPRESSION: Probable congestive heart failure, stable.  Original Report Authenticated By: Reyes Ivan, M.D.   Dg Chest Port 1 View  09/15/2011  *RADIOLOGY REPORT*  Clinical Data: Endotracheal tube  PORTABLE CHEST - 1 VIEW  Comparison: 09/14/2011  Findings: Hypoaerated, results in interstitial and vascular crowding.  Right IJ catheter tip projects over the distal SVC. Endotracheal tube tip projects 5.6 cm proximal to the carina. Right greater than left lung base opacities.  No pneumothorax. Status post median sternotomy and CABG.  No acute osseous abnormality.  IMPRESSION: Endotracheal tube tip projects 5.6 cm proximal to the carina.  Status post median sternotomy and CABG.  Bibasilar opacities; atelectasis versus infiltrate.  Original Report Authenticated By: Waneta Martins, M.D.   Dg Chest Port 1 View  09/14/2011   *RADIOLOGY REPORT*  Clinical Data: Intubated patient of  PORTABLE CHEST - 1 VIEW  Comparison: 09/13/2011; 09/11/1937; 01/23/2008  Findings:  Grossly unchanged enlarged cardiac silhouette and mediastinal contours with atherosclerotic calcifications of the thoracic aorta. Post median sternotomy and CABG. Endotracheal tube overlies tracheal air column with tips through the carina.  Interval placement of enteric tube with tip overlying the superior/mid esophagus.  Unchanged positioning of right jugular approach central venous catheter with tip overlying the superior cavoatrial junction.  Mild cephalization of flow without frank evidence of pulmonary edema.  There is blunting of bilateral costophrenic angles and grossly unchanged bibasilar heterogeneous / consolidative opacities, left greater than right.  No definite pneumothorax.  Unchanged bones.  IMPRESSION: 1.  Interval placement of enteric tube with tip over the superior/mid esophagus.  Repositioning and repeat radiograph is recommended. 2.  Otherwise, stable position of support apparatus.  No pneumothorax. 3.  Grossly unchanged low lung volumes, small effusions and bibasilar opacities, atelectasis versus infiltrate.  This is made call report.  Original Report Authenticated By: Waynard Reeds, M.D.   Dg Chest Port 1 View  09/13/2011  *RADIOLOGY REPORT*  Clinical Data: Line placement  PORTABLE CHEST - 1 VIEW  Comparison: 09/12/2011  Findings: Stable postoperative changes in the mediastinum. Endotracheal tube with tip about 5.3 cm above the carina.  Interval placement of right central venous catheter with tip over the mid SVC region.  No pneumothorax.  Shallow inspiration with atelectasis in the lung bases.  Mild cardiac enlargement.  Degenerative changes in the spine and shoulders.  Old fracture deformity of the left clavicle.  IMPRESSION: Appliances in satisfactory apparent location.  Shallow inspiration with basilar atelectasis.  Original Report Authenticated  By: Marlon Pel, M.D.   Dg Chest Port 1 View  09/12/2011  *RADIOLOGY REPORT*  Clinical Data: Endotracheal tube placement  PORTABLE CHEST - 1 VIEW  Comparison: 01/23/2008  Findings: Endotracheal tube was placed with tip about 4.5 cm above the carina.  Postoperative changes in the mediastinum with sternotomy wires and surgical clips and vascular markers present. Shallow inspiration.  Linear atelectasis in the lung bases and right midlung.  No apparent pneumothorax.  Calcified and tortuous aorta.  Degenerative changes in the shoulders.  IMPRESSION: Endotracheal tube tip is about 4.5 cm above the carina.  Shallow inspiration with basilar infiltration or atelectasis bilaterally.  Original Report Authenticated By: Marlon Pel, M.D.   Dg Abd Acute W/chest  09/12/2011  *RADIOLOGY REPORT*  Clinical Data: Abdominal pain and distention.  Bilateral lower extremity swelling.  Constipation.  ACUTE ABDOMEN SERIES (ABDOMEN 2 VIEW & CHEST  1 VIEW) 09/12/2011:  Comparison: No prior abdomen x-ray.  Two-view chest x-ray 01/23/2008 [facility].  Findings: Bowel gas pattern unremarkable without evidence of obstruction or significant ileus.  No evidence of free intraperitoneal air on the erect image.  Air-fluid levels in the stomach and in the colon.  Moderate stool burden involving the descending colon, sigmoid colon, and rectum.  No visible opaque urinary tract calculi.  Phlebolith low in the right side of the pelvis.  Degenerative changes involving the lumbar spine.  Prior sternotomy for CABG.  Cardiac silhouette mildly enlarged but stable.  Bilateral pleural effusions, left greater than right, with associated mild passive atelectasis in the lower lobes.  Lungs otherwise clear.  Pulmonary vascularity normal without evidence of pulmonary edema.  IMPRESSION:  1.  No acute abdominal abnormality.  Moderate stool burden. 2.  Stable cardiomegaly without pulmonary edema.  Bilateral pleural effusions, left greater  than right, and associated passive atelectasis in the lower lobes.  Original Report Authenticated By: Arnell Sieving, M.D.   Dg Kayleen Memos W/water Sol Cm  09/18/2011  *RADIOLOGY REPORT*  Clinical Data:  Postop from repair of perforated duodenal ulcer. Evaluate for postop anastomotic leak.  UPPER GI SERIES WITHOUT KUB  Technique:  Routine upper GI series was performed with water- soluble Omnipaque-300.  Fluoroscopy Time: 1.3 minutes  Comparison:  None.  Findings: No evidence of esophageal mass or stricture.  No evidence of hiatal hernia.  Evaluation of the stomach was somewhat limited due to incomplete distention on this single column exam, however there is no evidence of gastric mass or obstruction.  Prompt contrast opacification of the duodenum and proximal small bowel is seen.  A percutaneous G J tube is seen in place, with tip in the distal duodenum near the ligament of Treitz.  Irregular contour the duodenal bulb is seen, however there is no evidence of contrast leak or extravasation.  The duodenum and proximal jejunum are nondilated.  IMPRESSION: Expected postoperative changes from repair of perforated duodenal ulcer.  No evidence of anastomotic leak or other significant abnormality.  Original Report Authenticated By: Danae Orleans, M.D.   Dg Cm Inj Any Colonic Tube W/fluoro  09/18/2011  *RADIOLOGY REPORT*  Clinical Data: Postop from repair of perforated duodenal ulcer. Check G J tube placement and rule out leak at tube site.  CONTRAST INJECTION OF GASTROJEJUNOSTOMY TUBE UNDER FLUOROSCOPY  Fluoroscopy time:  0.2 minutes  Findings: Omnipaque-300 water-soluble contrast was injected through the existing gastrojejunostomy tube under fluoroscopy.  This shows that the G J tube is patent, and is normal in position with the tip in the distal duodenum near the ligament of Treitz. Injected contrast opacifies the gastric antrum and duodenum.  There is no evidence of contrast leak or extravasation from the stomach or  along the course of the G J tube.  IMPRESSION: Patent percutaneous G J tube, with tip in distal duodenum near the ligament of Treitz.  No evidence of contrast leak or extravasation.  Original Report Authenticated By: Danae Orleans, M.D.

## 2011-09-22 NOTE — Consult Note (Signed)
#   [id number] is dictation.

## 2011-09-23 LAB — GLUCOSE, CAPILLARY: Glucose-Capillary: 214 mg/dL — ABNORMAL HIGH (ref 70–99)

## 2011-09-23 MED ORDER — HEPARIN SODIUM (PORCINE) 5000 UNIT/ML IJ SOLN
5000.0000 [IU] | Freq: Three times a day (TID) | INTRAMUSCULAR | Status: DC
  Administered 2011-09-23 – 2011-09-24 (×3): 5000 [IU] via SUBCUTANEOUS
  Filled 2011-09-23 (×9): qty 1

## 2011-09-23 NOTE — Progress Notes (Signed)
General Surgery Note  LOS: 11 days  POD# 11  Assessment/Plan:  1.  EXPLORATORY LAPAROTOMY, UPPER GI ENDOSCOPY, Closure of perforated ulcer with [name] [name] patch, gastrojejunostomy feeding tube - 09/12/2011 - B. Biagio Quint, M.D.  Has 2 RUQ drains which are draining straw colored fluid - I removed one drain today. Eating well (he wolfed down some oatmeal).   2.  Duodenal ulcer   3.  CHF (congestive heart failure)  4.  CKD (chronic kidney disease) - Creat. 1.37 - 09/22/2011.    5.  Respiratory failure  6.  Diabetes mellitus  7.  Open wound, but clean 8.  Deconditioned - fell with PT on Friday.   9.  VTE - patient not on chemoprophylaxis, unknown reason.  Will start SQ heparin.  Subjective:  Appears to be doing okay.  Deconditioned.  Objective:   Filed Vitals:   09/23/11 0613  BP: 130/71  Pulse: 72  Temp: 98.6 F (37 C)  Resp: 19     Intake/Output from previous day:  02/23 0701 - 02/24 0700 In: 495 [P.O.:480] Out: 1715 [Urine:1650; Drains:65]  Intake/Output this shift:      Physical Exam:   General: WN older obese WM who is alert and oriented.    HEENT: Normal. Pupils equal. Good dentition. .   Lungs: Clear   Abdomen: Big ... GJ in place.  2 RUQ drains - I removed the one that had the least output.   Wound: open, but clean.   Neurologic:  Grossly intact to motor and sensory function.   Psychiatric: Has normal mood and affect. Behavior is normal   Lab Results:    Basename 09/22/11 0645 09/21/11 0400  WBC 6.7 6.0  HGB 10.3* 10.1*  HCT 29.5* 29.8*  PLT 65* 64*    BMET   Basename 09/21/11 0400  NA 136  K 3.7  CL 99  CO2 30  GLUCOSE 229*  BUN 26*  CREATININE 1.37*  CALCIUM 7.8*    PT/INR  No results found for this basename: LABPROT:2,INR:2 in the last 72 hours  ABG  No results found for this basename: PHART:2,PCO2:2,PO2:2,HCO3:2 in the last 72 hours   Studies/Results:  Dg Chest 2 View  09/21/2011  *RADIOLOGY REPORT*  Clinical Data: Short of breath.   Congestive heart failure.  CHEST - 2 VIEW  Comparison: 09/18/2011.  Findings: Right IJ central line unchanged with the tip in the mid SVC.  Small to moderate bilateral pleural effusions are present. CABG.  Cardiomegaly.  Basilar atelectasis.  Pulmonary vascular congestion.  Mild interstitial pulmonary edema.  IMPRESSION: Mild CHF with cardiomegaly and pulmonary vascular congestion. Small to moderate bilateral pleural effusions with associated atelectasis.  Effusions appear little changed compared to prior.  Original Report Authenticated By: Andreas Newport, M.D.     Anti-infectives:   Anti-infectives     Start     Dose/Rate Route Frequency Ordered Stop   09/13/11 0800  piperacillin-tazobactam (ZOSYN) IVPB 3.375 g       3.375 g 12.5 mL/hr over 240 Minutes Intravenous Every 8 hours 09/12/11 2221 09/19/11 2000   09/12/11 2300  piperacillin-tazobactam (ZOSYN) IVPB 3.375 g       3.375 g 12.5 mL/hr over 240 Minutes Intravenous NOW 09/12/11 2221 09/13/11 0403   09/12/11 1800   ertapenem (INVANZ) 1 g in sodium chloride 0.9 % 50 mL IVPB  Status:  Discontinued        1 g 100 mL/hr over 30 Minutes Intravenous Every 24 hours 09/12/11 1656 09/12/11 2215  Ovidio Kin, MD, FACS Pager: [phone],   [facility] Surgery Office: [phone] 09/23/2011

## 2011-09-23 NOTE — Progress Notes (Signed)
Pt transferred to chair this afternoon with X 3 assist. Pt very immobile and unable to stand on his feet.  To put pt back to bed at this time, it took X 2 assist and the [employer]. Pt cannot sit up straight on edge oe bed today to eat his meals, as he keeps falling side to side. Pt weak.

## 2011-09-23 NOTE — Consult Note (Signed)
[facility]IST CONSULT NOTE  This pleasant 78 year old nursing home resident was admitted to/13/13 with marked abdominal pain, distention, nausea and vomiting, rebound and found to have perforated ulcer. He underwent alert her laparotomy with closure of perforated duodenal ulcer with Cheree Ditto patch and an intraoperative sulfate to gastroduodenoscopy and placement of gastrojejunostomy tube was done.   She was managed by pulmonary medicine until 2/21 as he was intubated on 2/13 until 2/16. For what appears to be acute respiratory failure secondary to decompensated diastolic heart failure- of note patient was also volume resuscitated from 2/13-2/15   BNP 14,622 and he was diuresed  Initially was He was kept on IV Zosyn for intra-abdominal sepsis prophylaxis from 2/13 until 2/21.  He also developed thrombocytopenia during hospitalization, platelet count was 153 which dropped to a low point of 64 today.  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 It appears he might of been transfused over 2/52/60 his hemoglobin dropped to 7.7 but then went up to 9.2.  Sleeping.  No specific complaints.  Nursing reports doesn't like to ambulate.  Been eating very well with no regard to diabetic diet.  Recommendations 1. Diastolic heart failure-EF 60-65%-would restart his Lasix 40-keep at this dose on discharge.  Needs Bmet in 1 week-continue spironolactone 25 mg twice daily by mouth. He also likely would benefit from low-dose beta blocker as an outpatient. Marland Kitchen He will benefit from being on an ARB such as losartan 50 mg for prevention of cardiac remodeling purposes. 2. Diabetes mellitus-CBGs have ranged between as high as 300.  He clearly is not complying with diet restriction.  I have asked Diabetic nurse to "educate" him.  Will re-inforce with his wife, who I will call today.  At present would increase Lantus 10->20  units + sliding scale insulin. Wife informs me was on Insulin in out-patient  setting-will follow as [facility] [place] where he will be adequately supervised. 3. Coronary artery disease, 3 vessel in 2001, status post elective CABG 02/19/2000-this is stable at present time I would continue his aspirin. He was on Lopressor 25 mg bid in the past, and not on it now.   This can be sorted out with his primary care physician. 4. Remote history of osteomyelitis-to follow up with primary care physician 5. Hyperlipidemia-will need an outpatient lipid panel done. 6. Thrombocytopenia-unclear etiology. HIt panel negative- precipitants would include Invanz 2 doses that he received 2/13 (although only 1% risk), Zosyn ( in renal insufficiency) and patient did present with a BUN and creatinine of 43/2.53-Zosyn was discontinued 2/2. Peripheral smear showed toxic granulation and large platelets-Oncologist recommends to just follow this trend and it should improve.  On discharge would recommend a CBC and if persisting TCP, review at Dr. Gustavo Lah office.  Appreciate input. 7. Respiratory failure-we'll attempt to wean oxygen. We'll have him ambulate without oxygen and reassess. Patient may need to be discharged home on the same  The patient otherwise seems very stable.  Minor adjustments made to his insulin/diuretics. Consulted hematology, who have no specifc recommendations. Follow A1c. I will sign off today Call me with questions at below #, Thanks you for this consult Tecora Eustache,JAI 09/23/2011, 3:17 PM   Medications: Scheduled Meds:    . antiseptic oral rinse  15 mL Mouth Rinse QID  . aspirin EC  81 mg Oral Daily  . chlorhexidine  15 mL Mouth Rinse BID  . furosemide  60 mg Oral Daily  . heparin subcutaneous  5,000 Units Subcutaneous Q8H  . insulin aspart  0-9 Units Subcutaneous TID WC  . insulin glargine  5 Units Subcutaneous QHS  . losartan  50 mg Oral Daily  . pantoprazole sodium  40 mg Per Tube BID  . potassium chloride  40 mEq Oral Daily  . spironolactone  25 mg Oral BID  .  DISCONTD: furosemide  40 mg Oral Daily   Continuous Infusions:  PRN Meds:.sodium chloride  Objective: Weight change: -3.4 kg (-7 lb 7.9 oz)  Intake/Output Summary (Last 24 hours) at 09/23/11 1517 Last data filed at 09/23/11 0500  Gross per 24 hour  Intake    240 ml  Output   1065 ml  Net   -825 ml     HEENT alert, oriented Caucasian male. Does express some confusion about his medical care. CHEST no crackle no wheeze no added sound. CARDS S1-S2 no murmur rub or gallop-telemetry = sinus rhythm with PVCs ABD soft bowel sounds heard, we'll not examined however bandages changed this morning pursued NEURO moving all 4 limbs equally SKIN grade 1-2 pitting edema lower extremities.  Lab Results: CBC    Component Value Date/Time   WBC 6.7 09/22/2011 0645   RBC 2.97* 09/22/2011 0645   HGB 10.3* 09/22/2011 0645   HCT 29.5* 09/22/2011 0645   PLT 65* 09/22/2011 0645   MCV 99.3 09/22/2011 0645   MCH 34.7* 09/22/2011 0645   MCHC 34.9 09/22/2011 0645   RDW 14.1 09/22/2011 0645   LYMPHSABS 0.6* 09/12/2011 2230   MONOABS 0.2 09/12/2011 2230   EOSABS 0.0 09/12/2011 2230   BASOSABS 0.0 09/12/2011 2230    BMET    Component Value Date/Time   NA 136 09/21/2011 0400   K 3.7 09/21/2011 0400   CL 99 09/21/2011 0400   CO2 30 09/21/2011 0400   GLUCOSE 229* 09/21/2011 0400   BUN 26* 09/21/2011 0400   CREATININE 1.37* 09/21/2011 0400   CALCIUM 7.8* 09/21/2011 0400   GFRNONAA 44* 09/21/2011 0400   GFRAA 51* 09/21/2011 0400      Studies/Results: Ct Abdomen Pelvis Wo Contrast  09/12/2011  **ADDENDUM** CREATED: 09/12/2011 15:52:02  Critical Value/emergent results were called by telephone at the time of interpretation on 09/12/2011  at 3:50 p.m.  to  Dr. Alto Denver, who verbally acknowledged these results.  **END ADDENDUM** SIGNED BY: Aubery Lapping. Dover, M.D.    09/12/2011  *RADIOLOGY REPORT*  Clinical Data: Upper abdominal pain.  CT ABDOMEN AND PELVIS WITHOUT CONTRAST  Technique:  Multidetector CT imaging of the abdomen  and pelvis was performed following the standard protocol without intravenous contrast.  Comparison: Plain films 09/12/2011  Findings: There are locules of extraluminal free air in the upper abdomen adjacent to the liver edge.  Findings compatible with bowel perforation.  The distal stomach appears abnormal with wall thickening and surrounding stranding/inflammation.  This is concerning for peptic ulcer disease and possibly the source of the perforation.  Small amount of free fluid in the pelvis. There is also perihepatic fluid which is nearly isointense to the liver and difficult to visualize.  This could represent blood or extravasated oral contrast.  No evidence of bowel obstruction.  Scattered colonic diverticula.  No active diverticulitis.  Aorta and iliac vessels are calcified, non-aneurysmal.  There is a small hiatal hernia. No focal lesion in the liver, spleen, pancreas, adrenals or kidneys on this unenhanced study.  Pancreas is atrophic.  Urinary bladder unremarkable.  No acute bony abnormality.  IMPRESSION: Locules of free air and high-density free fluid in the abdomen. Abnormal pyloric region of the  stomach and possibly involving the first portion of the duodenum.  Findings concerning for perforated peptic ulcer disease.  Original Report Authenticated By: Cyndie Chime, M.D.   Dg Chest 2 View  09/18/2011  *RADIOLOGY REPORT*  Clinical Data: Shortness of breath.  Follow up CHF and effusions.  CHEST - 2 VIEW 09/18/2011 1210 hours:  Comparison: Portable chest x-ray earlier same date 0526 hours and dating back to 09/14/2011.  Findings: Prior sternotomy for CABG.  Cardiac silhouette enlarged but stable.  Interval improvement in the pulmonary venous hypertension and interstitial pulmonary edema, though the pulmonary venous hypertension persists.  Stable large bilateral pleural effusions, right greater than left, and associated dense consolidation in the lower lobes.  Right jugular central venous catheter tip  remains in the SVC.  No new abnormalities.  IMPRESSION: Improved pulmonary venous hypertension and pulmonary edema since earlier in the day.  Stable large bilateral pleural effusions, right greater than left, and associated dense passive atelectasis and/or pneumonia in the lower lobes.  No new abnormalities.  Original Report Authenticated By: Arnell Sieving, M.D.   Dg Abd 1 View  09/17/2011  *RADIOLOGY REPORT*  Clinical Data: Jejunostomy tube placement  ABDOMEN - 1 VIEW  Comparison: CT 09/12/2011  Findings: Gastrostomy tube is plate in place overlying the stomach. The surgical note indicated a gastric jejunostomy tube was  placed through the gastrostomy tube however I do not see this tubing.  Two  tubes are present overlying the right abdomen in the subhepatic space and extending in the epigastric area.  These may be  surgical drainage tubes.  Negative for bowel obstruction.  IMPRESSION: Gastrostomy tube is in good position overlying the stomach region. Gastric jejunostomy tube not identified.  Two surgical tubes are present on the right which may be surgical drains.  Original Report Authenticated By: Camelia Phenes, M.D.   Dg Chest Port 1 View  09/18/2011  *RADIOLOGY REPORT*  Clinical Data: Extubation, follow-up  PORTABLE CHEST - 1 VIEW  Comparison: Portable chest x-ray of 09/17/2011  Findings: No endotracheal tube is seen.  The lungs appear slightly better aerated.  Cardiomegaly, bilateral effusions and pulmonary vascular congestion remain.  A right IJ central venous line remains.  Median sternotomy sutures are noted.  IMPRESSION: Minimally improved aeration.  Little change in probable CHF with cardiomegaly, pulmonary vascular congestion, and effusions.  Original Report Authenticated By: Juline Patch, M.D.   Dg Chest Port 1 View  09/18/2011  *RADIOLOGY REPORT*  Clinical Data: Shortness of breath and wheezing; question of stridor.  PORTABLE CHEST - 1 VIEW  Comparison: Chest radiograph performed  earlier today at 04:50 a.m.  Findings: The lungs are well-aerated.  There are persistent small bilateral pleural effusions, similar in appearance to the prior study, with hazy bilateral airspace opacity, worse on the right. As before, this most likely reflects pulmonary edema, right worse than left.  Underlying vascular congestion is noted.  No pneumothorax is seen.  The cardiomediastinal silhouette is mildly enlarged; the patient is status post sternotomy, with evidence of prior CABG.  A right IJ line is noted ending about the mid SVC.  No acute osseous abnormalities are seen; chronic bony remodelling and resorption are noted at the distal right clavicle.  IMPRESSION: Stable appearance to pulmonary edema, right worse than left, with small bilateral pleural effusions; underlying vascular congestion and mild cardiomegaly noted.  Original Report Authenticated By: Tonia Ghent, M.D.   Dg Chest Port 1 View  09/17/2011  *RADIOLOGY REPORT*  Clinical Data: Atelectasis.  PORTABLE CHEST - 1 VIEW  Comparison: 09/15/2011.  Findings: Right IJ central line projects in the SVC.  The patient has been extubated in the interval.  Trachea is midline.  Heart size stable.  There are bilateral pleural effusions with mixed interstitial and airspace disease, basilar dependent.  IMPRESSION: Probable congestive heart failure, stable.  Original Report Authenticated By: Reyes Ivan, M.D.   Dg Chest Port 1 View  09/15/2011  *RADIOLOGY REPORT*  Clinical Data: Endotracheal tube  PORTABLE CHEST - 1 VIEW  Comparison: 09/14/2011  Findings: Hypoaerated, results in interstitial and vascular crowding.  Right IJ catheter tip projects over the distal SVC. Endotracheal tube tip projects 5.6 cm proximal to the carina. Right greater than left lung base opacities.  No pneumothorax. Status post median sternotomy and CABG.  No acute osseous abnormality.  IMPRESSION: Endotracheal tube tip projects 5.6 cm proximal to the carina.  Status post median  sternotomy and CABG.  Bibasilar opacities; atelectasis versus infiltrate.  Original Report Authenticated By: Waneta Martins, M.D.   Dg Chest Port 1 View  09/14/2011  *RADIOLOGY REPORT*  Clinical Data: Intubated patient of  PORTABLE CHEST - 1 VIEW  Comparison: 09/13/2011; 09/[birth year]/1937; 01/23/2008  Findings:  Grossly unchanged enlarged cardiac silhouette and mediastinal contours with atherosclerotic calcifications of the thoracic aorta. Post median sternotomy and CABG. Endotracheal tube overlies tracheal air column with tips through the carina.  Interval placement of enteric tube with tip overlying the superior/mid esophagus.  Unchanged positioning of right jugular approach central venous catheter with tip overlying the superior cavoatrial junction.  Mild cephalization of flow without frank evidence of pulmonary edema.  There is blunting of bilateral costophrenic angles and grossly unchanged bibasilar heterogeneous / consolidative opacities, left greater than right.  No definite pneumothorax.  Unchanged bones.  IMPRESSION: 1.  Interval placement of enteric tube with tip over the superior/mid esophagus.  Repositioning and repeat radiograph is recommended. 2.  Otherwise, stable position of support apparatus.  No pneumothorax. 3.  Grossly unchanged low lung volumes, small effusions and bibasilar opacities, atelectasis versus infiltrate.  This is made call report.  Original Report Authenticated By: Waynard Reeds, M.D.   Dg Chest Port 1 View  09/13/2011  *RADIOLOGY REPORT*  Clinical Data: Line placement  PORTABLE CHEST - 1 VIEW  Comparison: 09/12/2011  Findings: Stable postoperative changes in the mediastinum. Endotracheal tube with tip about 5.3 cm above the carina.  Interval placement of right central venous catheter with tip over the mid SVC region.  No pneumothorax.  Shallow inspiration with atelectasis in the lung bases.  Mild cardiac enlargement.  Degenerative changes in the spine and shoulders.  Old  fracture deformity of the left clavicle.  IMPRESSION: Appliances in satisfactory apparent location.  Shallow inspiration with basilar atelectasis.  Original Report Authenticated By: Marlon Pel, M.D.   Dg Chest Port 1 View  09/12/2011  *RADIOLOGY REPORT*  Clinical Data: Endotracheal tube placement  PORTABLE CHEST - 1 VIEW  Comparison: 01/23/2008  Findings: Endotracheal tube was placed with tip about 4.5 cm above the carina.  Postoperative changes in the mediastinum with sternotomy wires and surgical clips and vascular markers present. Shallow inspiration.  Linear atelectasis in the lung bases and right midlung.  No apparent pneumothorax.  Calcified and tortuous aorta.  Degenerative changes in the shoulders.  IMPRESSION: Endotracheal tube tip is about 4.5 cm above the carina.  Shallow inspiration with basilar infiltration or atelectasis bilaterally.  Original Report Authenticated By: Marlon Pel, M.D.  Dg Abd Acute W/chest  09/12/2011  *RADIOLOGY REPORT*  Clinical Data: Abdominal pain and distention.  Bilateral lower extremity swelling.  Constipation.  ACUTE ABDOMEN SERIES (ABDOMEN 2 VIEW & CHEST 1 VIEW) 09/12/2011:  Comparison: No prior abdomen x-ray.  Two-view chest x-ray 01/23/2008 [facility].  Findings: Bowel gas pattern unremarkable without evidence of obstruction or significant ileus.  No evidence of free intraperitoneal air on the erect image.  Air-fluid levels in the stomach and in the colon.  Moderate stool burden involving the descending colon, sigmoid colon, and rectum.  No visible opaque urinary tract calculi.  Phlebolith low in the right side of the pelvis.  Degenerative changes involving the lumbar spine.  Prior sternotomy for CABG.  Cardiac silhouette mildly enlarged but stable.  Bilateral pleural effusions, left greater than right, with associated mild passive atelectasis in the lower lobes.  Lungs otherwise clear.  Pulmonary vascularity normal without evidence of  pulmonary edema.  IMPRESSION:  1.  No acute abdominal abnormality.  Moderate stool burden. 2.  Stable cardiomegaly without pulmonary edema.  Bilateral pleural effusions, left greater than right, and associated passive atelectasis in the lower lobes.  Original Report Authenticated By: Arnell Sieving, M.D.   Dg Kayleen Memos W/water Sol Cm  09/18/2011  *RADIOLOGY REPORT*  Clinical Data:  Postop from repair of perforated duodenal ulcer. Evaluate for postop anastomotic leak.  UPPER GI SERIES WITHOUT KUB  Technique:  Routine upper GI series was performed with water- soluble Omnipaque-300.  Fluoroscopy Time: 1.3 minutes  Comparison:  None.  Findings: No evidence of esophageal mass or stricture.  No evidence of hiatal hernia.  Evaluation of the stomach was somewhat limited due to incomplete distention on this single column exam, however there is no evidence of gastric mass or obstruction.  Prompt contrast opacification of the duodenum and proximal small bowel is seen.  A percutaneous G J tube is seen in place, with tip in the distal duodenum near the ligament of Treitz.  Irregular contour the duodenal bulb is seen, however there is no evidence of contrast leak or extravasation.  The duodenum and proximal jejunum are nondilated.  IMPRESSION: Expected postoperative changes from repair of perforated duodenal ulcer.  No evidence of anastomotic leak or other significant abnormality.  Original Report Authenticated By: Danae Orleans, M.D.   Dg Cm Inj Any Colonic Tube W/fluoro  09/18/2011  *RADIOLOGY REPORT*  Clinical Data: Postop from repair of perforated duodenal ulcer. Check G J tube placement and rule out leak at tube site.  CONTRAST INJECTION OF GASTROJEJUNOSTOMY TUBE UNDER FLUOROSCOPY  Fluoroscopy time:  0.2 minutes  Findings: Omnipaque-300 water-soluble contrast was injected through the existing gastrojejunostomy tube under fluoroscopy.  This shows that the G J tube is patent, and is normal in position with the tip in the  distal duodenum near the ligament of Treitz. Injected contrast opacifies the gastric antrum and duodenum.  There is no evidence of contrast leak or extravasation from the stomach or along the course of the G J tube.  IMPRESSION: Patent percutaneous G J tube, with tip in distal duodenum near the ligament of Treitz.  No evidence of contrast leak or extravasation.  Original Report Authenticated By: Danae Orleans, M.D.

## 2011-09-24 LAB — GLUCOSE, CAPILLARY
Glucose-Capillary: 206 mg/dL — ABNORMAL HIGH (ref 70–99)
Glucose-Capillary: 229 mg/dL — ABNORMAL HIGH (ref 70–99)

## 2011-09-24 LAB — CBC
HCT: 27.5 % — ABNORMAL LOW (ref 39.0–52.0)
MCH: 34.5 pg — ABNORMAL HIGH (ref 26.0–34.0)
MCHC: 34.5 g/dL (ref 30.0–36.0)
RDW: 13.9 % (ref 11.5–15.5)

## 2011-09-24 LAB — HEMOGLOBIN A1C
Hgb A1c MFr Bld: 6.7 % — ABNORMAL HIGH (ref <5.7)
Mean Plasma Glucose: 146 mg/dL — ABNORMAL HIGH (ref <117)

## 2011-09-24 MED ORDER — LIVING WELL WITH DIABETES BOOK
Freq: Once | Status: DC
  Filled 2011-09-24: qty 1

## 2011-09-24 MED ORDER — OXYCODONE-ACETAMINOPHEN 5-325 MG PO TABS: 1.0000 | ORAL_TABLET | ORAL | Status: AC | PRN

## 2011-09-24 NOTE — Discharge Instructions (Signed)
Normal Saline Wet to dry dressing changes to abdominal wound BID Dry gauze around GJ tube, change daily.  GJ tube is no longer being used.  [facility]      [facility], [place] [phone]  OPEN ABDOMINAL SURGERY: POST OP INSTRUCTIONS  Always review your discharge instruction sheet given to you by the facility where your surgery was performed.  IF YOU HAVE DISABILITY OR FAMILY LEAVE FORMS, YOU MUST BRING THEM TO THE OFFICE FOR PROCESSING.  PLEASE DO NOT GIVE THEM TO YOUR DOCTOR.  1. A prescription for pain medication may be given to you upon discharge.  Take your pain medication as prescribed, if needed.  If narcotic pain medicine is not needed, then you may take acetaminophen (Tylenol) or ibuprofen (Advil) as needed. 2. Take your usually prescribed medications unless otherwise directed. 3. If you need a refill on your pain medication, please contact your pharmacy. They will contact our office to request authorization.  Prescriptions will not be filled after 5pm or on week-ends. 4. You should follow a light diet the first few days after arrival home, such as soup and crackers, pudding, etc.unless your doctor has advised otherwise. A high-fiber, low fat diet can be resumed as tolerated.   Be sure to include lots of fluids daily. Most patients will experience some swelling and bruising on the chest and neck area.  Ice packs will help.  Swelling and bruising can take several days to resolve 5. Most patients will experience some swelling and bruising in the area of the incision. Ice pack will help. Swelling and bruising can take several days to resolve..  6. It is common to experience some constipation if taking pain medication after surgery.  Increasing fluid intake and taking a stool softener will usually help or prevent this problem from occurring.  A mild laxative (Milk of Magnesia or Miralax) should be taken according to package directions if there are no bowel movements after 48 hours. 7.  You  may have steri-strips (small skin tapes) in place directly over the incision.  These strips should be left on the skin for 7-10 days.  If your surgeon used skin glue on the incision, you may shower in 24 hours.  The glue will flake off over the next 2-3 weeks.  Any sutures or staples will be removed at the office during your follow-up visit. You may find that a light gauze bandage over your incision may keep your staples from being rubbed or pulled. You may shower and replace the bandage daily. 8. ACTIVITIES:  You may resume regular (light) daily activities beginning the next day--such as daily self-care, walking, climbing stairs--gradually increasing activities as tolerated.  You may have sexual intercourse when it is comfortable.  Refrain from any heavy lifting or straining until approved by your doctor. a. You may drive when you no longer are taking prescription pain medication, you can comfortably wear a seatbelt, and you can safely maneuver your car and apply brakes b. Return to Work: ___________________________________ 9. You should see your doctor in the office for a follow-up appointment approximately two weeks after your surgery.  Make sure that you call for this appointment within a day or two after you arrive home to insure a convenient appointment time. OTHER INSTRUCTIONS:  _____________________________________________________________ _____________________________________________________________  WHEN TO CALL YOUR DOCTOR: 1. Fever over 101.0 2. Inability to urinate 3. Nausea and/or vomiting 4. Extreme swelling or bruising 5. Continued bleeding from incision. 6. Increased pain, redness, or drainage from the incision. 7.  Difficulty swallowing or breathing 8. Muscle cramping or spasms. 9. Numbness or tingling in hands or feet or around lips.  The clinic staff is available to answer your questions during regular business hours.  Please don't hesitate to call and ask to speak to one of the  nurses if you have concerns.  For further questions, please visit [email or web address]

## 2011-09-24 NOTE — Progress Notes (Signed)
Inpatient Diabetes Program Recommendations  AACE/ADA: New Consensus Statement on Inpatient Glycemic Control (2009)  Target Ranges:  Prepandial:   less than 140 mg/dL      Peak postprandial:   less than 180 mg/dL (1-2 hours)      Critically ill patients:  140 - 180 mg/dL   Reason for Visit: Hyperglycemia  Results for [name] [name], [name] [name] (MRN [id number]) as of 09/24/2011 14:32  Ref. Range 09/23/2011 12:06 09/23/2011 16:58 09/23/2011 21:17 09/24/2011 07:57 09/24/2011 12:13  Glucose-Capillary Latest Range: 70-99 mg/dL 213 (H) 086 (H) 578 (H) 206 (H) 229 (H)  Results for [name], [name] (MRN [id number]) as of 09/24/2011 14:32  Ref. Range 09/23/2011 17:15  Hemoglobin A1C Latest Range: <5.7 % 6.7 (H)    Inpatient Diabetes Program Recommendations Insulin - Basal: Increase Lantus to 15 units QHS  Note: Will send Living Well With Diabetes book, encourage pt to make healthier diet choices.

## 2011-09-24 NOTE — Discharge Summary (Signed)
Karime Scheuermann M. Lavonna Lampron, MD, FACS General, Bariatric, & Minimally Invasive Surgery [facility]  

## 2011-09-24 NOTE — Progress Notes (Signed)
Pt to be d/c to [facility] for SNF placement today, via [facility] transport.

## 2011-09-24 NOTE — Progress Notes (Signed)
Physical Therapy Treatment Patient Details Name: [name] [name] MRN: [id number] DOB: 16-Dec-1919 Today's Date: 09/24/2011  PT Assessment/Plan  PT - Assessment/Plan Comments on Treatment Session: Pt continues to require increased assist for transfers and fatigues quickly.  Pt also performed 20 ankle pumps but could not continue with exercises due to fatigue. PT Plan: Discharge plan remains appropriate;Frequency remains appropriate Follow Up Recommendations: Skilled nursing facility Equipment Recommended: Defer to next venue PT Goals  Acute Rehab PT Goals PT Goal: Supine/Side to Sit - Progress: Progressing toward goal PT Goal: Sit to Stand - Progress: Progressing toward goal PT Goal: Stand to Sit - Progress: Progressing toward goal PT Goal: Perform Home Exercise Program - Progress: Progressing toward goal  PT Treatment Precautions/Restrictions  Precautions Precautions: Fall Restrictions Weight Bearing Restrictions: No Mobility (including Balance) Bed Mobility Bed Mobility: Yes Supine to Sit: 1: +2 Total assist;Patient percentage (comment) Supine to Sit Details (indicate cue type and reason): pt=50%, pt required some assist for LEs to EOB, increased assist still required for trunk Transfers Transfers: Yes Sit to Stand: 3: Mod assist;From bed;From elevated surface;With upper extremity assist Sit to Stand Details (indicate cue type and reason): performed x2 due to pt reporting R calf cramp upon first attempt, assist due to weakness Stand to Sit: 3: Mod assist;To chair/3-in-1 Stand to Sit Details: assist to control descent, verbal cue for armrest Stand Pivot Transfers: 3: Mod assist Stand Pivot Transfer Details (indicate cue type and reason): verbal cues for safe technique, +2 for safety, assist for weakness and moving RW Ambulation/Gait Ambulation/Gait: No    Exercise    End of Session PT - End of Session Equipment Utilized During Treatment: Gait belt (remained on  oxygen) Activity Tolerance: Patient limited by fatigue Patient left: in chair;with call bell in reach General Behavior During Session: [facility] for tasks performed Cognition: [facility] for tasks performed  Alli Jasmer,KATHrine E 09/24/2011, 12:56 PM Pager: [phone]

## 2011-09-24 NOTE — Progress Notes (Signed)
Patient ID: [name] [name], male   DOB: 07-11-1920, 78 y.o.   MRN: [id number] 12 Days Post-Op  Subjective: Pt feels well.  No c/o.  Tolerating a regular diet.  Objective: Vital signs in last 24 hours: Temp:  [98.7 F (37.1 C)-99.1 F (37.3 C)] 98.8 F (37.1 C) (02/25 0512) Pulse Rate:  [71-76] 71  (02/25 0512) Resp:  [16-17] 17  (02/25 0512) BP: (109-116)/(58-71) 114/65 mmHg (02/25 0512) SpO2:  [95 %-96 %] 96 % (02/25 0512) Weight:  [225 lb 8.5 oz (102.3 kg)] 225 lb 8.5 oz (102.3 kg) (02/25 0512) Last BM Date: 09/23/11  Intake/Output from previous day: 02/24 0701 - 02/25 0700 In: 660 [P.O.:640] Out: 1385 [Urine:1350; Drains:34; Stool:1] Intake/Output this shift: Total I/O In: 5 [Other:5] Out: -   PE: Abd: soft, wound is clean and packed.  +BS, GJ tube in place.  +BS, ND  Lab Results:   Basename 09/24/11 0810 09/22/11 0645  WBC 5.6 6.7  HGB 9.5* 10.3*  HCT 27.5* 29.5*  PLT 67* 65*   BMET No results found for this basename: NA:2,K:2,CL:2,CO2:2,GLUCOSE:2,BUN:2,CREATININE:2,CALCIUM:2 in the last 72 hours PT/INR No results found for this basename: LABPROT:2,INR:2 in the last 72 hours   Studies/Results: No results found.  Anti-infectives: Anti-infectives     Start     Dose/Rate Route Frequency Ordered Stop   09/13/11 0800  piperacillin-tazobactam (ZOSYN) IVPB 3.375 g       3.375 g 12.5 mL/hr over 240 Minutes Intravenous Every 8 hours 09/12/11 2221 09/19/11 2000   09/12/11 2300  piperacillin-tazobactam (ZOSYN) IVPB 3.375 g       3.375 g 12.5 mL/hr over 240 Minutes Intravenous NOW 09/12/11 2221 09/13/11 0403   09/12/11 1800   ertapenem (INVANZ) 1 g in sodium chloride 0.9 % 50 mL IVPB  Status:  Discontinued        1 g 100 mL/hr over 30 Minutes Intravenous Every 24 hours 09/12/11 1656 09/12/11 2215           Assessment/Plan  1. S/p perd duo ulcer, s/p repair 2. CHF, stable 3. DM 4. Deconditioning  Plan: 1. Patient is medically stable and surgically  stable for discharge today to SNF.   2. Follow up with Dr. Biagio Quint in 2-3 weeks   LOS: 12 days    [name] [name] E 09/24/2011

## 2011-09-24 NOTE — Discharge Summary (Signed)
Patient ID: [name] [name] MRN: [id number] DOB/AGE: 04-09-1920 78 y.o.  Admit date: 09/12/2011 Discharge date: 09/24/2011  Procedures:  Exploratory laparotomy with repair of perforated duodenal ulcer and placement of GJ tube  Consults: pulmonary/intensive care, internal medicine  Reason for Admission:  This is an 78yo white male who began having abdominal pain about a week prior to admission.  This became progressively worse and he was unable to eat.  On the day of admission he developed nausea and vomiting along with worsening pain.  He was brought to [facility] where he had a CT scan that revealed upper abdominal free air.  He was admitted.  Please see admitting H&P for further details.  Admission Diagnoses: 1. Abdominal pain with pneumoperitoneum Patient Active Problem List  Diagnoses  . UNSPECIFIED ANEMIA  . BLOOD IN STOOL  . Duodenal ulcer  . CHF (congestive heart failure)  . CKD (chronic kidney disease)  . Shock  . Respiratory failure  . Diabetes mellitus  CAD  Hospital Course: The patient was admitted and started on IV abx therapy.  He was taken to the operating room where he was found to have a perforated duodenal ulcer.  He had this repaired and placement of a gastrojejunostomy tube.  He had 2 other JP drains placed as well.  He was taken to the ICU postoperatively on the ventilator.  PCCM helped [place] manage his VDRF and his other medical problems while in the ICU.  He was able to be extubated on POD# 3.  Because he had a G tube his NG tube was able to be removed with extubation.  He received a gastrografin swallow which revealed no leak at repair site.  On POD# 6, he had a swallow study which did not show any evidence of dysphagia.  He was initially started on 20cc of tube feed through his J tube.  Once his swallow study was complete, we began to advance his diet.  His diet was ultimately able to be advanced as tolerated and his tube feeds were stopped after tolerating clear liquids.   His surgical wound was left open for secondary intention closure.  He received normal saline wet to dry dressings BID during his stay.  His JP drains were putting out minimal serosanguinous output and these were both discontinued prior to discharge.  His GJ tube remained in placed and was not discontinued.    2. Deconditioning: The patient was quite deconditioned postoperatively.  PT/OT saw the patient and worked with him during his stay.  They recommended SNF placement which was pursued.    3. CHF: The patient did suffer from some diastolic CHF and SOB postoperatively.  He was treated with Lasix to help.  This ultimately resolved with the help of PCCM and the internal medicine team.  4. DM:  The patient remained relatively stable from this standpoint.  He was on a sliding scale to help manage this problem.  5. Other medical problems:  His other medical problems remained stable throughout his stay.  Discharge Diagnoses:  Principal Problem:  *Duodenal ulcer Active Problems:  CHF (congestive heart failure)  CKD (chronic kidney disease)  Shock  Respiratory failure  Diabetes mellitus s/p exploratory laparotomy with repair of perforated duodenal ulcer and placement of a GJ tube  Discharge Medications: Medication List  As of 09/24/2011 12:08 PM   TAKE these medications         aspirin EC 81 MG EC tablet   Generic drug: aspirin   Take 81 mg  by mouth daily. Swallow whole.      Calcium 600+D 600-400 MG-UNIT per tablet   Generic drug: Calcium Carbonate-Vitamin D   Take 1 tablet by mouth daily.      cholecalciferol 1000 UNITS tablet   Commonly known as: VITAMIN D   Take 2,000 Units by mouth daily.      Fish Oil 1200 MG Caps   Take 1 capsule by mouth daily.      furosemide 40 MG tablet   Commonly known as: LASIX   Take 40 mg by mouth daily.      glipiZIDE 2.5 MG 24 hr tablet   Commonly known as: GLUCOTROL XL   Take 2.5 mg by mouth daily.      insulin glargine 100 UNIT/ML injection     Commonly known as: LANTUS   Inject 54 Units into the skin at bedtime.      losartan 50 MG tablet   Commonly known as: COZAAR   Take 50 mg by mouth daily.      mupirocin ointment 2 %   Commonly known as: BACTROBAN   Apply 1 application topically daily. Sores      oxyCODONE-acetaminophen 5-325 MG per tablet   Commonly known as: PERCOCET   Take 1 tablet by mouth every 4 (four) hours as needed for pain.      spironolactone 25 MG tablet   Commonly known as: ALDACTONE   Take 25 mg by mouth 2 (two) times daily.          Protonix 40 mg po BID  Discharge Instructions: Follow-up Information    Follow up with GREEN, Lenon Curt, MD. (As needed)       Follow up with LAYTON, BRIAN DAVID, DO on 10/16/2011. (1:00pm, arrive at 12:30pm)    Contact information:   [address] [place] [address] [phone]          Signed: Letha Cape 09/24/2011, 12:08 PM

## 2011-09-24 NOTE — Progress Notes (Signed)
I saw the patient, participated in the history, exam and medical decision making, and concur with the physician assistant's note above.  Erendida Wrenn M. Dyke Weible, MD, FACS General, Bariatric, & Minimally Invasive Surgery [facility]   

## 2011-09-26 ENCOUNTER — Ambulatory Visit: Payer: Self-pay | Admitting: Gastroenterology

## 2011-09-26 DIAGNOSIS — D696 Thrombocytopenia, unspecified: Secondary | ICD-10-CM

## 2011-09-26 HISTORY — DX: Thrombocytopenia, unspecified: D69.6

## 2011-10-01 ENCOUNTER — Encounter (HOSPITAL_COMMUNITY): Payer: Self-pay | Admitting: General Surgery

## 2011-10-16 ENCOUNTER — Encounter (INDEPENDENT_AMBULATORY_CARE_PROVIDER_SITE_OTHER): Payer: Self-pay | Admitting: General Surgery

## 2011-10-16 ENCOUNTER — Ambulatory Visit (INDEPENDENT_AMBULATORY_CARE_PROVIDER_SITE_OTHER): Payer: Medicare Other | Admitting: General Surgery

## 2011-10-16 VITALS — BP 128/72 | HR 92 | Temp 97.8°F | Resp 18 | Ht 68.5 in | Wt 221.6 lb

## 2011-10-16 DIAGNOSIS — Z5189 Encounter for other specified aftercare: Secondary | ICD-10-CM

## 2011-10-16 DIAGNOSIS — Z4889 Encounter for other specified surgical aftercare: Secondary | ICD-10-CM

## 2011-10-16 MED ORDER — CLARITHROMYCIN 500 MG PO TABS: 500.0000 mg | ORAL_TABLET | Freq: Two times a day (BID) | ORAL | Status: AC

## 2011-10-16 MED ORDER — AMOXICILLIN 500 MG PO CAPS: 1000.0000 mg | ORAL_CAPSULE | Freq: Two times a day (BID) | ORAL | Status: AC

## 2011-10-16 NOTE — Progress Notes (Signed)
Subjective:     Patient ID: [name] [name], male   DOB: 1920/04/15, 78 y.o.   MRN: [id number]  HPI This patient follows up in month status post total laparotomy and Cheree Ditto patch of a perforated duodenal ulcer. He is currently in a nursing home is undergoing physical therapy but he states he is doing pretty well and tolerating regular diet. He denies any pain. He is not using his gastrostomy tube. He is still taking his Protonix b.i.d. and his serum was positive for H. pylori. I don't know that he was formally treated for this. He is currently doing wet to dry dressing changes twice daily for his midline wound. He has no complaints.  Review of Systems     Objective:   Physical Exam No distress and nontoxic-appearing His midline incision is healing well without sign of infection he is some pink and healthy granulation tissue and his dressing was changed today. His GJ tube is digging into his skin and I modified this and cut out one of the stitches. I flushed his tube as well and both ports are working    Assessment:     Status post exploratory laparotomy with repair of perforated duodenal ulcer. He seems to be doing pretty well. He is tolerating diet and not having any reflux symptoms. His wounds appear to be healing well. I would like to leave his gastrostomy tube for another month prior to removal.    Plan:     Continue his current wound care with b.i.d. wet to dry dressings to his midline wound. Redressed his gastrostomy tube site to prevent the hub from digging into the skin. I have recommended antibiotic treatment for H. pylori which was diagnosed at the time of his laparotomy. We will see him back in 4 weeks and hopefully his wound will be nearly healed at that time. At that time if he is still eating well and gaining weight we can consider removing his gastrostomy tube. We will also send him for repeat EGD to document healing of this ulcer and confirmation of eradication of his H. Pylori.  Continue BID PPI. Amoxicillin 1gm BID and clarithromycin 500 BID and Protonix BID

## 2011-11-13 ENCOUNTER — Telehealth (INDEPENDENT_AMBULATORY_CARE_PROVIDER_SITE_OTHER): Payer: Self-pay | Admitting: General Surgery

## 2011-11-13 ENCOUNTER — Encounter (INDEPENDENT_AMBULATORY_CARE_PROVIDER_SITE_OTHER): Payer: Self-pay | Admitting: General Surgery

## 2011-11-13 ENCOUNTER — Ambulatory Visit (INDEPENDENT_AMBULATORY_CARE_PROVIDER_SITE_OTHER): Payer: Medicare Other | Admitting: General Surgery

## 2011-11-13 VITALS — BP 119/63 | HR 110 | Temp 98.0°F | Ht 68.0 in | Wt 229.4 lb

## 2011-11-13 DIAGNOSIS — Z5189 Encounter for other specified aftercare: Secondary | ICD-10-CM

## 2011-11-13 DIAGNOSIS — Z4889 Encounter for other specified surgical aftercare: Secondary | ICD-10-CM

## 2011-11-13 NOTE — Telephone Encounter (Signed)
Nurse at pt's nursing home calling for orders or protocol for flushing tube.  Wife returned with pt from office visit today, stating his tube was to be flushed.  Please FAX orders to:  [phone], Attn:  [facility] nurse.

## 2011-11-13 NOTE — Progress Notes (Signed)
Subjective:     Patient ID: [name] [name], male   DOB: August 18, 1919, 78 y.o.   MRN: [id number]  HPI This patient has a 2 month status post exposure laparotomy and open repair and patching of a large perforated duodenal ulcer. He denies any current or persistent abdominal pain or reflux. He is taking Protonix twice daily. He was also positive for H. pylori and we added clarithromycin as well to his treatment. He is not using his feeding tube and is eating well without any complaints. He has been gaining weight. He is currently living in a rehabilitation facility working in a strengthening.  Review of Systems     Objective:   Physical Exam His incision is healing well and nearly completely healed. He does have the exposed sutures in the midline and nitroglycerin and is back today so they were flush with the skin and hopefully allow this wound to completely heal. His G-tube is flushed as well as well as his J-tube and the excoriation of the skin is better although still has some irritation of the skin in the caudad aspect of his tube.    Assessment:     Status post repair of perforated duodenal ulcer-doing well He seems to be doing very well given his circumstances. His only need right now is for continued rehabilitation and strengthening. He continues on b.i.d. Protonix and has no symptoms of heartburn or abdominal pain. He also remains on aspirin as well and I recommended that he discuss this with his primary care physician to evaluate the risks and benefits of this given the history of perforated ulcer. I recommended for him and set up a EGD to document healing of the ulcer and also biopsy for H. pylori to document eradication of his H. pylori. If everything is healed at that time then I will recommend removal of his GJ tube as well while he is sedated. Otherwise, we can remove this in the clinic.    Plan:     Continue with his Protonix b.i.d. and direct recommend stopping his aspirin if okay  with his primary care physician. We will set him up for EGD and biopsies and we will see him back after this.

## 2011-11-15 ENCOUNTER — Telehealth (INDEPENDENT_AMBULATORY_CARE_PROVIDER_SITE_OTHER): Payer: Self-pay

## 2011-11-15 ENCOUNTER — Other Ambulatory Visit (INDEPENDENT_AMBULATORY_CARE_PROVIDER_SITE_OTHER): Payer: Self-pay

## 2011-11-15 DIAGNOSIS — Z9889 Other specified postprocedural states: Secondary | ICD-10-CM

## 2011-11-15 DIAGNOSIS — Z431 Encounter for attention to gastrostomy: Secondary | ICD-10-CM

## 2011-11-15 NOTE — Telephone Encounter (Signed)
G & J Tube flush orders faxed to [facility] at [phone] [facility]

## 2011-12-03 ENCOUNTER — Telehealth (INDEPENDENT_AMBULATORY_CARE_PROVIDER_SITE_OTHER): Payer: Self-pay | Admitting: General Surgery

## 2011-12-03 NOTE — Telephone Encounter (Signed)
French Ana at [facility] calling about pt's G-tube leaking gastric fluids, and asking for orders.  Paged Dr. Biagio Quint and updated; he states if not using the G-tube, to pull it.  Called French Ana back and she verified they are not using the G-tube.  Gave orders to pull the tube.  She understands and will comply.

## 2011-12-05 ENCOUNTER — Encounter: Payer: Self-pay | Admitting: Gastroenterology

## 2011-12-05 ENCOUNTER — Ambulatory Visit (INDEPENDENT_AMBULATORY_CARE_PROVIDER_SITE_OTHER): Payer: Medicare Other | Admitting: Gastroenterology

## 2011-12-05 VITALS — BP 128/70 | HR 88 | Ht 68.0 in | Wt 237.4 lb

## 2011-12-05 DIAGNOSIS — B9681 Helicobacter pylori [H. pylori] as the cause of diseases classified elsewhere: Secondary | ICD-10-CM

## 2011-12-05 DIAGNOSIS — A048 Other specified bacterial intestinal infections: Secondary | ICD-10-CM

## 2011-12-05 DIAGNOSIS — K265 Chronic or unspecified duodenal ulcer with perforation: Secondary | ICD-10-CM

## 2011-12-05 NOTE — Patient Instructions (Addendum)
You have been scheduled for an endoscopy. Please follow written instructions given to you at your visit today. cc: Murray Hodgkins, MD       Lodema Pilot, DO

## 2011-12-05 NOTE — Progress Notes (Signed)
History of Present Illness: This is an 78 year old male here today with his wife. He is referred for followup of perforated duodenal ulcer with Helicobacter pyloric gastritis. He has multiple comorbidities and is currently residing at [facility]. I reviewed extensive records from his hospitalization in February 2013 and subsequent follow up visits with Dr. Biagio Quint. He underwent operative repair of his perforated duodenal ulcer in February. Upper endoscopy was performed during that surgery revealing a perforated duodenal ulcer and esophagitis. Followup endoscopy was suggested because he had a large perforated duodenal ulcer to document healing. He has no gastrointestinal complaints at this time. He's been treated with pantoprazole 40 mg twice daily and he does take a daily 81 mg aspirin. He had a gastrostomy tube in place which was recently pulled. Denies weight loss, abdominal pain, constipation, diarrhea, change in stool caliber, melena, hematochezia, nausea, vomiting, dysphagia, reflux symptoms, chest pain.   Review of Systems: Pertinent positive and negative review of systems were noted in the above HPI section. All other review of systems were otherwise negative.  Current Medications, Allergies, Past Medical History, Past Surgical History, Family History and Social History were reviewed in [employer] record.  Physical Exam: General: Well developed , well nourished, obese, in a wheelchair, no acute distress Head: Normocephalic and atraumatic Eyes:  sclerae anicteric, EOMI Ears: Normal auditory acuity Mouth: No deformity or lesions Neck: Supple, no masses or thyromegaly Lungs: Clear throughout to auscultation Heart: Regular rate and rhythm; no murmurs, rubs or bruits Abdomen: Soft, non tender and non distended. No masses, hepatosplenomegaly or hernias noted. Normal Bowel sounds Musculoskeletal: Symmetrical with no gross deformities  Skin: No lesions on visible  extremities Pulses:  Normal pulses noted Extremities: No clubbing, cyanosis, edema or deformities noted Neurological: Alert oriented x 4, grossly nonfocal Cervical Nodes:  No significant cervical adenopathy Inguinal Nodes: No significant inguinal adenopathy Psychological:  Alert and cooperative. Normal mood and affect  Assessment and Recommendations:  1. History of perforated duodenal ulcer and H. pylori gastritis which was treated. Continue pantoprazole 40 mg twice daily. Decision has been made to continue aspirin 81 mg daily for now and this should be reevaluated by his primary physician in light of his complicated ulcer disease. I would recommend avoiding any additional aspirin and NSAID products long-term. Schedule endoscopy to document healing of his large duodenal ulcer. Consider decreasing pantoprazole to daily once his ulcer has healed. The risks, benefits, and alternatives to endoscopy with possible biopsy and possible dilation were discussed with the patient and they consent to proceed. He is at increased risk for sedation and endoscopy due to his significant comorbidities.  2. Helicobacter pylori. Biopsies and stool antigen testing will be unreliable given that he remains on acid suppressants which can lead to false negative results. It is not practical or advisable for him to discontinue proton pump inhibitors for several weeks to allow for more accurate H. pylori testing. He should remain on a PPI lifelong without a break in therapy due to his history of a perforated duodenal ulcer.   3. Esophagitis. Standard antireflux measures and pantoprazole 40 mg twice daily for now. Consider decreasing pantoprazole to once daily following endoscopy.  4. Coronary artery disease  5. Congestive heart failure  6. Sleep apnea  7. Chronic kidney disease  8. Anemia

## 2011-12-13 ENCOUNTER — Encounter: Payer: Self-pay | Admitting: Gastroenterology

## 2011-12-13 ENCOUNTER — Ambulatory Visit (AMBULATORY_SURGERY_CENTER): Payer: Medicare Other | Admitting: Gastroenterology

## 2011-12-13 VITALS — BP 133/72 | HR 73 | Temp 96.1°F | Resp 27 | Ht 68.0 in | Wt 237.0 lb

## 2011-12-13 DIAGNOSIS — K265 Chronic or unspecified duodenal ulcer with perforation: Secondary | ICD-10-CM

## 2011-12-13 DIAGNOSIS — A048 Other specified bacterial intestinal infections: Secondary | ICD-10-CM

## 2011-12-13 DIAGNOSIS — K2970 Gastritis, unspecified, without bleeding: Secondary | ICD-10-CM

## 2011-12-13 DIAGNOSIS — K2990 Gastroduodenitis, unspecified, without bleeding: Secondary | ICD-10-CM

## 2011-12-13 LAB — GLUCOSE, CAPILLARY: Glucose-Capillary: 89 mg/dL (ref 70–99)

## 2011-12-13 MED ORDER — SODIUM CHLORIDE 0.9 % IV SOLN: 500.0000 mL | INTRAVENOUS | Status: DC

## 2011-12-13 MED ORDER — DEXTROSE 5 % IV SOLN: INTRAVENOUS | Status: DC

## 2011-12-13 NOTE — Patient Instructions (Signed)
Small ulcer in duodenum.  Mild gastritis.  Await biopsy results.   Reduce Protonix to once daily  Avoid NSAIDS long term  YOU HAD AN ENDOSCOPIC PROCEDURE TODAY AT THE [facility]: Refer to the procedure report that was given to you for any specific questions about what was found during the examination.  If the procedure report does not answer your questions, please call your gastroenterologist to clarify.  If you requested that your care partner not be given the details of your procedure findings, then the procedure report has been included in a sealed envelope for you to review at your convenience later.  YOU SHOULD EXPECT: Some feelings of bloating in the abdomen. Passage of more gas than usual.  Walking can help get rid of the air that was put into your GI tract during the procedure and reduce the bloating. If you had a lower endoscopy (such as a colonoscopy or flexible sigmoidoscopy) you may notice spotting of blood in your stool or on the toilet paper. If you underwent a bowel prep for your procedure, then you may not have a normal bowel movement for a few days.  DIET: Your first meal following the procedure should be a light meal and then it is ok to progress to your normal diet.  A half-sandwich or bowl of soup is an example of a good first meal.  Heavy or fried foods are harder to digest and may make you feel nauseous or bloated.  Likewise meals heavy in dairy and vegetables can cause extra gas to form and this can also increase the bloating.  Drink plenty of fluids but you should avoid alcoholic beverages for 24 hours.  ACTIVITY: Your care partner should take you home directly after the procedure.  You should plan to take it easy, moving slowly for the rest of the day.  You can resume normal activity the day after the procedure however you should NOT DRIVE or use heavy machinery for 24 hours (because of the sedation medicines used during the test).    SYMPTOMS TO REPORT  IMMEDIATELY: A gastroenterologist can be reached at any hour.  During normal business hours, 8:30 AM to 5:00 PM Monday through Friday, call [phone].  After hours and on weekends, please call the GI answering service at [phone] who will take a message and have the physician on call contact you.   Following upper endoscopy (EGD)  Vomiting of blood or coffee ground material  New chest pain or pain under the shoulder blades  Painful or persistently difficult swallowing  New shortness of breath  Fever of 100F or higher  Black, tarry-looking stools  FOLLOW UP: If any biopsies were taken you will be contacted by phone or by letter within the next 1-3 weeks.  Call your gastroenterologist if you have not heard about the biopsies in 3 weeks.  Our staff will call the home number listed on your records the next business day following your procedure to check on you and address any questions or concerns that you may have at that time regarding the information given to you following your procedure. This is a courtesy call and so if there is no answer at the home number and we have not heard from you through the emergency physician on call, we will assume that you have returned to your regular daily activities without incident.  SIGNATURES/CONFIDENTIALITY: You and/or your care partner have signed paperwork which will be entered into your electronic medical record.  These  signatures attest to the fact that that the information above on your After Visit Summary has been reviewed and is understood.  Full responsibility of the confidentiality of this discharge information lies with you and/or your care-partner.

## 2011-12-13 NOTE — Op Note (Signed)
[facility] [address] [place], [place]  [address]  ENDOSCOPY PROCEDURE REPORT PATIENT:  [name] [name], [name] [name]  MR#:  [id number] BIRTHDATE:  27-May-1920, 89 yrs. old  GENDER:  male ENDOSCOPIST:  Judie Petit T. Russella Dar, MD, [facility]  PROCEDURE DATE:  12/13/2011 PROCEDURE:  EGD with biopsy, 62[address] ASA CLASS:  Class III INDICATIONS:  follow-up of duodenal ulcer, H. pylori MEDICATIONS:    These medications were titrated to patient response per physician's verbal order, Fentanyl 50 mcg IV, Versed 4 mg IV TOPICAL ANESTHETIC:  Cetacaine Spray DESCRIPTION OF PROCEDURE:   After the risks benefits and alternatives of the procedure were thoroughly explained, informed consent was obtained.  The LB [id number]-H180 [id number] endoscope was introduced through the mouth and advanced to the second portion of the duodenum, without limitations.  The instrument was slowly withdrawn as the mucosa was fully examined. <<PROCEDUREIMAGES>> An ulcer was found in the bulb of the duodenum. It was 3 mm in size. Almost healed with suture in base of ulcer and deformity of the bulb.  Otherwise normal duodenum.  Mild gastritis was found in the body of the stomach. Biopsies of the antrum and body of the stomach were obtained and sent to pathology. Prior G-Tube site in the gastric body. Otherwise normal stomach. The esophagus and gastroesophageal junction were completely normal in appearance. Retroflexed views revealed no abnormalities.  The scope was then withdrawn from the patient and the procedure completed.  COMPLICATIONS:  None  ENDOSCOPIC IMPRESSION: 1) 3 mm ulcer in the bulb of duodenum 2) Mild gastritis in the body of the stomach 3) Prior G-tube site in the body of the stomach  RECOMMENDATIONS: 1) Await pathology results 2) PPI qam long term 3) Avoid NSAIDS long term 4) Follow-up appointment with primary MD as planned  Cloe Sockwell T. Russella Dar, MD, [facility]  CC:  Murray Hodgkins, MD      Lodema Pilot DO  n. eSIGNED:    Venita Lick. Ryhanna Dunsmore at 12/13/2011 12:34 PM  Larwance Rote, [phone]

## 2011-12-13 NOTE — Progress Notes (Signed)
The pt tolerated the egd very well. Maw   

## 2011-12-13 NOTE — Progress Notes (Signed)
Patient did not have preoperative order for IV antibiotic SSI prophylaxis. (G8918)  Patient did not experience any of the following events: a burn prior to discharge; a fall within the facility; wrong site/side/patient/procedure/implant event; or a hospital transfer or hospital admission upon discharge from the facility. (G8907)  

## 2011-12-13 NOTE — Progress Notes (Signed)
Pt blood sugar 89 in admitting.  Pts wife states that this is somewhat low for him.  Per Dr Russella Dar will hang D5 IV to avoid pt going low during procedure.

## 2011-12-14 ENCOUNTER — Telehealth: Payer: Self-pay

## 2011-12-14 NOTE — Telephone Encounter (Signed)
  Follow up Call-  Call back number 12/13/2011  Post procedure Call Back phone  # [phone] [facility] ext [phone]/ wife's # [phone]  Permission to leave phone message Yes     Patient questions:  Do you have a fever, pain , or abdominal swelling? no Pain Score  0 *  Have you tolerated food without any problems? yes  Have you been able to return to your normal activities? yes  Do you have any questions about your discharge instructions: Diet   no Medications  no Follow up visit  no  Do you have questions or concerns about your Care? no  Actions: * If pain score is 4 or above: No action needed, pain <4.  I spoke with Alcario Drought, LPN at assisted living home, she said Dr. Murray Hodgkins has the pt taking ASA 81mg  daily.  Dr. Russella Dar had on his orders to avoid NSAIDS long term.  Per Dr. Russella Dar  He said he wrote a note for Dr. Chilton Si to be contacted and for him to decide whether to hold asa or not.  Message given to Alcario Drought, LPN. Maw

## 2011-12-18 ENCOUNTER — Encounter: Payer: Self-pay | Admitting: Gastroenterology

## 2012-01-07 DIAGNOSIS — D551 Anemia due to other disorders of glutathione metabolism: Secondary | ICD-10-CM

## 2012-01-07 DIAGNOSIS — D529 Folate deficiency anemia, unspecified: Secondary | ICD-10-CM

## 2012-01-07 HISTORY — DX: Folate deficiency anemia, unspecified: D52.9

## 2012-01-07 HISTORY — DX: Anemia due to other disorders of glutathione metabolism: D55.1

## 2012-01-09 ENCOUNTER — Encounter (INDEPENDENT_AMBULATORY_CARE_PROVIDER_SITE_OTHER): Payer: Medicare Other | Admitting: General Surgery

## 2012-01-21 ENCOUNTER — Telehealth (INDEPENDENT_AMBULATORY_CARE_PROVIDER_SITE_OTHER): Payer: Self-pay

## 2012-01-21 NOTE — Telephone Encounter (Signed)
Left message for patient to call our office regarding his appointment on 01/25/12.  Appt  need's to be r/s Dr. Delice Lesch office cxl'd 01/25/12.

## 2012-01-22 ENCOUNTER — Telehealth (INDEPENDENT_AMBULATORY_CARE_PROVIDER_SITE_OTHER): Payer: Self-pay

## 2012-01-22 NOTE — Telephone Encounter (Signed)
Spoke to Ms. Krohn, patient r/s from 01/25/12 to 02/21/12 @ 9:00 am w/Dr. Biagio Quint.

## 2012-01-25 ENCOUNTER — Encounter (INDEPENDENT_AMBULATORY_CARE_PROVIDER_SITE_OTHER): Payer: Medicare Other | Admitting: General Surgery

## 2012-02-21 ENCOUNTER — Encounter (INDEPENDENT_AMBULATORY_CARE_PROVIDER_SITE_OTHER): Payer: Self-pay | Admitting: General Surgery

## 2012-02-21 ENCOUNTER — Ambulatory Visit (INDEPENDENT_AMBULATORY_CARE_PROVIDER_SITE_OTHER): Payer: Medicare Other | Admitting: General Surgery

## 2012-02-21 VITALS — BP 128/76 | HR 68 | Temp 97.6°F | Resp 16 | Ht 68.0 in

## 2012-02-21 DIAGNOSIS — K2990 Gastroduodenitis, unspecified, without bleeding: Secondary | ICD-10-CM

## 2012-02-21 NOTE — Progress Notes (Signed)
Subjective:     Patient ID: [name] [name], male   DOB: August 08, 1919, 78 y.o.   MRN: [id number]  HPI This patient follows up status post open repair of perforated duodenal ulcer. He remains on Protonix but he also remains on a baby aspirin daily. He had a repeat endoscopy which demonstrated a small continued ulcer and some gastritis biopsies were negative for malignancy. He denies any abdominal pain or reflux symptoms. He is eating well without any complaints. His gastrostomy tube was removed and he feels much better without this. His main issue is mobility and ambulation and he is working with the rehabilitation facility with regard to this. He denies any blood in the stool or melena.  Review of Systems     Objective:   Physical Exam No acute distress nontoxic appearing His abdomen is soft and nontender on exam his incision is healing well without signs of hernia. His G-tube site is well healed.    Assessment:     Status post repair of perforated ulcer-doing well It appears that this was a nonmalignant on followup biopsies. It also appears that this is mostly healed but he still does have some residual gastritis and a residual ulcer. He is asymptomatic at this time is that I recommended that he continue with his Protonix at least daily and preferably twice daily and I recommended that he discuss the risks and benefits of the baby aspirin which he continues to take. From an ulcer standpoint, I would recommend that he stop this but again I recommended that he discuss this with his primary care physician to evaluate the benefits to cardiovascular health.    Plan:     He has recovered pretty well and I have no further recommendations for him other than to continue the Protonix and consider stopping his baby aspirin and to increase his physical activity as tolerated he can follow up with me on a p.r.n. basis.

## 2012-03-12 DIAGNOSIS — J189 Pneumonia, unspecified organism: Secondary | ICD-10-CM

## 2012-03-12 HISTORY — DX: Pneumonia, unspecified organism: J18.9

## 2012-03-25 DIAGNOSIS — K625 Hemorrhage of anus and rectum: Secondary | ICD-10-CM

## 2012-03-25 HISTORY — DX: Hemorrhage of anus and rectum: K62.5

## 2012-03-26 DIAGNOSIS — R197 Diarrhea, unspecified: Secondary | ICD-10-CM

## 2012-03-26 HISTORY — DX: Diarrhea, unspecified: R19.7

## 2012-04-02 DIAGNOSIS — E871 Hypo-osmolality and hyponatremia: Secondary | ICD-10-CM

## 2012-04-02 HISTORY — DX: Hypo-osmolality and hyponatremia: E87.1

## 2012-05-26 DIAGNOSIS — F0393 Unspecified dementia, unspecified severity, with mood disturbance: Secondary | ICD-10-CM

## 2012-05-26 DIAGNOSIS — F0390 Unspecified dementia without behavioral disturbance: Secondary | ICD-10-CM

## 2012-05-26 HISTORY — DX: Unspecified dementia without behavioral disturbance: F03.90

## 2012-05-26 HISTORY — DX: Unspecified dementia, unspecified severity, with mood disturbance: F03.93

## 2012-10-06 DIAGNOSIS — L259 Unspecified contact dermatitis, unspecified cause: Secondary | ICD-10-CM

## 2012-10-06 HISTORY — DX: Unspecified contact dermatitis, unspecified cause: L25.9

## 2012-10-22 ENCOUNTER — Encounter: Payer: Self-pay | Admitting: Nurse Practitioner

## 2012-10-22 ENCOUNTER — Non-Acute Institutional Stay (SKILLED_NURSING_FACILITY): Payer: Medicare Other | Admitting: Nurse Practitioner

## 2012-10-22 DIAGNOSIS — N189 Chronic kidney disease, unspecified: Secondary | ICD-10-CM

## 2012-10-22 DIAGNOSIS — F0390 Unspecified dementia without behavioral disturbance: Secondary | ICD-10-CM

## 2012-10-22 DIAGNOSIS — F3289 Other specified depressive episodes: Secondary | ICD-10-CM

## 2012-10-22 DIAGNOSIS — E039 Hypothyroidism, unspecified: Secondary | ICD-10-CM

## 2012-10-22 DIAGNOSIS — E119 Type 2 diabetes mellitus without complications: Secondary | ICD-10-CM

## 2012-10-22 DIAGNOSIS — I509 Heart failure, unspecified: Secondary | ICD-10-CM

## 2012-10-22 DIAGNOSIS — F329 Major depressive disorder, single episode, unspecified: Secondary | ICD-10-CM

## 2012-10-22 DIAGNOSIS — D649 Anemia, unspecified: Secondary | ICD-10-CM

## 2012-10-22 DIAGNOSIS — W19XXXA Unspecified fall, initial encounter: Secondary | ICD-10-CM

## 2012-10-22 DIAGNOSIS — I10 Essential (primary) hypertension: Secondary | ICD-10-CM

## 2012-10-22 DIAGNOSIS — Y92129 Unspecified place in nursing home as the place of occurrence of the external cause: Secondary | ICD-10-CM
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
## 2012-10-23 LAB — TSH: TSH: 1.76 u[IU]/mL (ref 0.41–5.90)

## 2012-10-23 LAB — CBC AND DIFFERENTIAL
Hemoglobin: 10 g/dL — AB (ref 13.5–17.5)
Platelets: 85 10*3/uL — AB (ref 150–399)
WBC: 6 10^3/mL

## 2012-10-23 LAB — HEMOGLOBIN A1C: Hgb A1c MFr Bld: 7.2 % — AB (ref 4.0–6.0)

## 2012-10-23 LAB — HEPATIC FUNCTION PANEL
ALT: 8 U/L — AB (ref 10–40)
AST: 12 U/L — AB (ref 14–40)

## 2012-10-23 LAB — BASIC METABOLIC PANEL: Glucose: 123 mg/dL

## 2012-11-17 ENCOUNTER — Non-Acute Institutional Stay (SKILLED_NURSING_FACILITY): Payer: Medicare Other | Admitting: Nurse Practitioner

## 2012-11-17 DIAGNOSIS — D649 Anemia, unspecified: Secondary | ICD-10-CM

## 2012-11-17 DIAGNOSIS — I10 Essential (primary) hypertension: Secondary | ICD-10-CM

## 2012-11-17 DIAGNOSIS — E119 Type 2 diabetes mellitus without complications: Secondary | ICD-10-CM

## 2012-11-17 DIAGNOSIS — I509 Heart failure, unspecified: Secondary | ICD-10-CM

## 2012-11-17 DIAGNOSIS — F0390 Unspecified dementia without behavioral disturbance: Secondary | ICD-10-CM

## 2012-11-17 DIAGNOSIS — K269 Duodenal ulcer, unspecified as acute or chronic, without hemorrhage or perforation: Secondary | ICD-10-CM

## 2012-11-17 DIAGNOSIS — N189 Chronic kidney disease, unspecified: Secondary | ICD-10-CM

## 2012-11-17 DIAGNOSIS — F329 Major depressive disorder, single episode, unspecified: Secondary | ICD-10-CM

## 2012-11-17 NOTE — Assessment & Plan Note (Signed)
Hgb 10.0 10/23/12, takes folic acid 1mg, B12, Fe 325mg bid.    

## 2012-11-17 NOTE — Assessment & Plan Note (Signed)
Stable on Namenda 28mg       

## 2012-11-17 NOTE — Assessment & Plan Note (Signed)
Hgb A1c 7.2 3/27/149(was 7.4 07/22/12). Takes Lantus 35units, Novolog 5 units with meals.      

## 2012-11-17 NOTE — Progress Notes (Signed)
Patient ID: [name] [name], male   DOB: September 30, 1919, 79 y.o.   MRN: [id number]  Chief Complaint:  Chief Complaint  Patient presents with  . Medical Managment of Chronic Issues     HPI:   Problem List Items Addressed This Visit     ICD-9-CM   UNSPECIFIED ANEMIA     Hgb 10.0 10/23/12, takes folic acid 1mg , B12, Fe 325mg  bid.     Duodenal ulcer     Stable on Protonix 40mg  bid, no c/o stomach pain, indigestion, or bloody stools.     CHF (congestive heart failure)     Well compensated on Furosemide 20mg      CKD (chronic kidney disease)     Bun/creat 36/1.46 10/23/12,  on Furosemide 20mg      Diabetes mellitus     Hgb A1c 7.2 3/27/149(was 7.4 07/22/12). Takes Lantus 35units, Novolog 5 units with meals.     Depression - Primary     Seems irritable since Cymbalta was decreased to 30mg  10/22/12 after fall. Still able to sleep and eat.     Dementia     Stable on Namenda 28mg     Essential hypertension, benign     Controlled on Losartan 50mg  daily       Review of Systems:  Review of Systems  Constitutional: Negative for fever, chills, weight loss, malaise/fatigue and diaphoresis.  HENT: Positive for hearing loss. Negative for ear pain, congestion, sore throat and neck pain.   Eyes: Negative for pain, discharge and redness.  Respiratory: Negative for cough, sputum production, shortness of breath and wheezing.   Cardiovascular: Positive for leg swelling (trace). Negative for chest pain, palpitations, orthopnea, claudication and PND.  Gastrointestinal: Negative for heartburn, nausea, vomiting, abdominal pain, diarrhea, constipation and blood in stool.  Genitourinary: Positive for frequency. Negative for dysuria, urgency, hematuria and flank pain.  Musculoskeletal: Positive for back pain, joint pain and falls. Negative for myalgias.  Skin: Negative for itching and rash.  Neurological: Negative for dizziness, tingling, tremors, sensory change, speech change, focal weakness, seizures,  loss of consciousness, weakness and headaches.  Endo/Heme/Allergies: Negative for environmental allergies and polydipsia. Does not bruise/bleed easily.  Psychiatric/Behavioral: Positive for memory loss. Negative for depression and hallucinations. The patient is not nervous/anxious and does not have insomnia.      Medications: Reviewed at [facility]   Physical Exam: Physical Exam  Constitutional: He is oriented to person, place, and time. He appears well-developed and well-nourished. No distress.  HENT:  Head: Normocephalic and atraumatic.  Right Ear: External ear normal.  Left Ear: External ear normal.  Nose: Nose normal.  Mouth/Throat: Oropharynx is clear and moist. No oropharyngeal exudate.  Eyes: Conjunctivae and EOM are normal. Pupils are equal, round, and reactive to light.  Neck: Normal range of motion. Neck supple. No JVD present. No thyromegaly present.  Cardiovascular: Normal rate, regular rhythm and normal heart sounds.   No murmur heard. Pulmonary/Chest: Effort normal. No respiratory distress. He has no wheezes. He has rales in the right lower field and the left lower field. He exhibits no tenderness.  Abdominal: Soft. Bowel sounds are normal. He exhibits no distension and no mass. There is no tenderness. There is no rebound.  Musculoskeletal: Normal range of motion.  Lymphadenopathy:    He has no cervical adenopathy.  Neurological: He is alert and oriented to person, place, and time. He has normal reflexes. He displays normal reflexes. No cranial nerve deficit. Coordination normal.  Skin: Skin is warm and dry. No rash noted. He  is not diaphoretic. No erythema.  Psychiatric: He has a normal mood and affect. His behavior is normal. Judgment and thought content normal.     Filed Vitals:   11/17/12 1045  BP: 107/63  Pulse: 90  Temp: 98.3 F (36.8 C)  TempSrc: Tympanic  Resp: 18      Labs reviewed: Basic Metabolic Panel:  Recent Labs  45/40/98  NA 133*  K 4.6  BUN  36*  CREATININE 1.5*  TSH 1.76    Liver Function Tests:  Recent Labs  10/23/12  AST 12*  ALT 8*  ALKPHOS 89    CBC:  Recent Labs  10/23/12  WBC 6.0  HGB 10.0*  HCT 29*  PLT 85*    Anemia Panel: No results found for this basename: IRON, FOLATE, VITAMINB12,  in the last 8760 hours  Significant Diagnostic Results:     Assessment/Plan Depression Seems irritable since Cymbalta was decreased to 30mg  10/22/12 after fall. Still able to sleep and eat.   Diabetes mellitus Hgb A1c 7.2 3/27/149(was 7.4 07/22/12). Takes Lantus 35units, Novolog 5 units with meals.   Dementia Stable on Namenda 28mg   CKD (chronic kidney disease) Bun/creat 36/1.46 10/23/12,  on Furosemide 20mg    CHF (congestive heart failure) Well compensated on Furosemide 20mg    UNSPECIFIED ANEMIA Hgb 10.0 10/23/12, takes folic acid 1mg , B12, Fe 325mg  bid.   Essential hypertension, benign Controlled on Losartan 50mg  daily  Duodenal ulcer Stable on Protonix 40mg  bid, no c/o stomach pain, indigestion, or bloody stools.       Family/ staff Communication: safety    Goals of care: SNF   Labs/tests ordered none.

## 2012-11-17 NOTE — Assessment & Plan Note (Signed)
Controlled on Losartan 50 mg daily.

## 2012-11-17 NOTE — Assessment & Plan Note (Signed)
Stable on Protonix 40mg bid, no c/o stomach pain, indigestion, or bloody stools.                    

## 2012-11-17 NOTE — Assessment & Plan Note (Signed)
Seems irritable since Cymbalta was decreased to 30mg  10/22/12 after fall. Still able to sleep and eat.

## 2012-11-17 NOTE — Assessment & Plan Note (Signed)
Well compensated on Furosemide 20mg    

## 2012-11-17 NOTE — Assessment & Plan Note (Addendum)
Bun/creat 36/1.46 10/23/12,  on Furosemide 20mg 

## 2012-12-10 ENCOUNTER — Non-Acute Institutional Stay (SKILLED_NURSING_FACILITY): Payer: Medicare Other | Admitting: Nurse Practitioner

## 2012-12-10 DIAGNOSIS — E119 Type 2 diabetes mellitus without complications: Secondary | ICD-10-CM

## 2012-12-10 DIAGNOSIS — K269 Duodenal ulcer, unspecified as acute or chronic, without hemorrhage or perforation: Secondary | ICD-10-CM

## 2012-12-10 DIAGNOSIS — E039 Hypothyroidism, unspecified: Secondary | ICD-10-CM

## 2012-12-10 DIAGNOSIS — D649 Anemia, unspecified: Secondary | ICD-10-CM

## 2012-12-10 DIAGNOSIS — F3289 Other specified depressive episodes: Secondary | ICD-10-CM

## 2012-12-10 DIAGNOSIS — I509 Heart failure, unspecified: Secondary | ICD-10-CM

## 2012-12-10 DIAGNOSIS — I10 Essential (primary) hypertension: Secondary | ICD-10-CM

## 2012-12-10 DIAGNOSIS — F0390 Unspecified dementia without behavioral disturbance: Secondary | ICD-10-CM

## 2012-12-10 DIAGNOSIS — F329 Major depressive disorder, single episode, unspecified: Secondary | ICD-10-CM

## 2012-12-10 NOTE — Assessment & Plan Note (Signed)
Hgb 10.0 10/23/12, takes folic acid 1mg , B12, Fe 325mg  bid.

## 2012-12-10 NOTE — Assessment & Plan Note (Signed)
Stable on Protonix 40mg bid, no c/o stomach pain, indigestion, or bloody stools.                    

## 2012-12-10 NOTE — Assessment & Plan Note (Signed)
Hgb A1c 7.2 3/27/149(was 7.4 07/22/12). Takes Lantus 35units, Novolog 5 units with meals.      

## 2012-12-10 NOTE — Assessment & Plan Note (Signed)
Corrected with Levothyroxine 75mcg, TSH 1.763 10/23/12       

## 2012-12-10 NOTE — Assessment & Plan Note (Signed)
Controlled on Losartan 50 mg daily.

## 2012-12-10 NOTE — Assessment & Plan Note (Signed)
Seems irritable since Cymbalta was decreased to 30mg 10/22/12 after fall. Still able to sleep and eat.            

## 2012-12-10 NOTE — Assessment & Plan Note (Signed)
Well compensated on Furosemide 20mg 

## 2012-12-10 NOTE — Progress Notes (Signed)
Patient ID: [name] [name], male   DOB: 04/02/1920, 79 y.o.   MRN: [id number]  Chief Complaint:  Chief Complaint  Patient presents with  . Medical Managment of Chronic Issues     HPI:   Problem List Items Addressed This Visit   UNSPECIFIED ANEMIA     Hgb 10.0 10/23/12, takes folic acid 1mg , B12, Fe 325mg  bid.       Duodenal ulcer     Stable on Protonix 40mg  bid, no c/o stomach pain, indigestion, or bloody stools.       CHF (congestive heart failure)     Well compensated on Furosemide 20mg        Depression     Seems irritable since Cymbalta was decreased to 30mg  10/22/12 after fall. Still able to sleep and eat.       Dementia     Stable on Namenda 28mg       Essential hypertension, benign     Controlled on Losartan 50mg  daily      Unspecified hypothyroidism     Corrected with Levothyroxine , TSH 1.763 10/23/12    Diabetes mellitus - Primary (Chronic)     Hgb A1c 7.2 3/27/149(was 7.4 07/22/12). Takes Lantus 35units, Novolog 5 units with meals.          Review of Systems:  Review of Systems  Constitutional: Negative for fever, chills, weight loss, malaise/fatigue and diaphoresis.  HENT: Positive for hearing loss. Negative for ear pain, congestion, sore throat and neck pain.   Eyes: Negative for pain, discharge and redness.  Respiratory: Negative for cough, sputum production, shortness of breath and wheezing.   Cardiovascular: Positive for leg swelling (trace). Negative for chest pain, palpitations, orthopnea, claudication and PND.  Gastrointestinal: Negative for heartburn, nausea, vomiting, abdominal pain, diarrhea, constipation and blood in stool.  Genitourinary: Positive for frequency. Negative for dysuria, urgency, hematuria and flank pain.  Musculoskeletal: Positive for back pain, joint pain and falls. Negative for myalgias.  Skin: Negative for itching and rash.  Neurological: Negative for dizziness, tingling, tremors, sensory change, speech change,  focal weakness, seizures, loss of consciousness, weakness and headaches.  Endo/Heme/Allergies: Negative for environmental allergies and polydipsia. Does not bruise/bleed easily.  Psychiatric/Behavioral: Positive for memory loss. Negative for depression and hallucinations. The patient is not nervous/anxious and does not have insomnia.      Medications: Patient's Medications  New Prescriptions   No medications on file  Previous Medications   CALCIUM CARBONATE-VITAMIN D (CALCIUM 600+D) 600-400 MG-UNIT PER TABLET    Take 1 tablet by mouth daily.   CHOLECALCIFEROL (VITAMIN D) 1000 UNITS TABLET    Take 1,000 Units by mouth daily.   DULOXETINE (CYMBALTA) 30 MG CAPSULE    Take 30 mg by mouth daily.   FERROUS SULFATE 325 (65 FE) MG TABLET    Take 325 mg by mouth 2 (two) times daily.   FOLIC ACID (FOLVITE) 1 MG TABLET       FUROSEMIDE (LASIX) 20 MG TABLET    Take 20 mg by mouth 2 (two) times daily.   INSULIN ASPART (NOVOLOG) 100 UNIT/ML INJECTION    Inject 5 Units into the skin 3 (three) times daily before meals. For CBG>150   INSULIN GLARGINE (LANTUS) 100 UNIT/ML INJECTION    Inject 35 Units into the skin at bedtime.    LEVOTHYROXINE (SYNTHROID, LEVOTHROID) 75 MCG TABLET    Take 75 mcg by mouth daily.   LOSARTAN (COZAAR) 50 MG TABLET    Take 50 mg by mouth daily.  MEMANTINE (NAMENDA) 10 MG TABLET    Take 10 mg by mouth 2 (two) times daily.   OMEGA-3 FATTY ACIDS (FISH OIL) 1200 MG CAPS    Take 1 capsule by mouth daily.   PANTOPRAZOLE (PROTONIX) 40 MG TABLET    Take 40 mg by mouth 2 (two) times daily.   VITAMIN B-12 (CYANOCOBALAMIN) 1000 MCG TABLET    Take 1,000 mcg by mouth daily.  Modified Medications   No medications on file  Discontinued Medications   No medications on file     Physical Exam: Physical Exam  Constitutional: He is oriented to person, place, and time. He appears well-developed and well-nourished. No distress.  HENT:  Head: Normocephalic and atraumatic.  Right Ear:  External ear normal.  Left Ear: External ear normal.  Nose: Nose normal.  Mouth/Throat: Oropharynx is clear and moist. No oropharyngeal exudate.  Eyes: Conjunctivae and EOM are normal. Pupils are equal, round, and reactive to light.  Neck: Normal range of motion. Neck supple. No JVD present. No thyromegaly present.  Cardiovascular: Normal rate, regular rhythm and normal heart sounds.   No murmur heard. Pulmonary/Chest: Effort normal. No respiratory distress. He has no wheezes. He has rales in the right lower field and the left lower field. He exhibits no tenderness.  Abdominal: Soft. Bowel sounds are normal. He exhibits no distension and no mass. There is no tenderness. There is no rebound.  Musculoskeletal: Normal range of motion.  Lymphadenopathy:    He has no cervical adenopathy.  Neurological: He is alert and oriented to person, place, and time. He has normal reflexes. He displays normal reflexes. No cranial nerve deficit. Coordination normal.  Skin: Skin is warm and dry. No rash noted. He is not diaphoretic. No erythema.  Psychiatric: He has a normal mood and affect. His behavior is normal. Judgment and thought content normal.     Filed Vitals:   12/10/12 1612  BP: 134/72  Pulse: 70  Temp: 98 F (36.7 C)  TempSrc: Tympanic  Resp: 18      Labs reviewed: Basic Metabolic Panel:  Recent Labs  16/10/96  NA 133*  K 4.6  BUN 36*  CREATININE 1.5*  TSH 1.76    Liver Function Tests:  Recent Labs  10/23/12  AST 12*  ALT 8*  ALKPHOS 89    CBC:  Recent Labs  10/23/12  WBC 6.0  HGB 10.0*  HCT 29*  PLT 85*    Anemia Panel: No results found for this basename: IRON, FOLATE, VITAMINB12,  in the last 8760 hours  Significant Diagnostic Results:     Assessment/Plan Diabetes mellitus Hgb A1c 7.2 3/27/149(was 7.4 07/22/12). Takes Lantus 35units, Novolog 5 units with meals.     Unspecified hypothyroidism Corrected with Levothyroxine , TSH 1.763  10/23/12  Depression Seems irritable since Cymbalta was decreased to 30mg  10/22/12 after fall. Still able to sleep and eat.     UNSPECIFIED ANEMIA Hgb 10.0 10/23/12, takes folic acid 1mg , B12, Fe 325mg  bid.     CHF (congestive heart failure) Well compensated on Furosemide 20mg      Essential hypertension, benign Controlled on Losartan 50mg  daily    Dementia Stable on Namenda 28mg     Duodenal ulcer Stable on Protonix 40mg  bid, no c/o stomach pain, indigestion, or bloody stools.         Family/ staff Communication: safety   Goals of care: SNF   Labs/tests ordered none

## 2012-12-10 NOTE — Assessment & Plan Note (Signed)
Stable on Namenda 28mg 

## 2012-12-11 IMAGING — CT CT ABD-PELV W/O CM
1 of 2 series · 14 of 32 positions shown, 18 images · non-contrast
Comparison: Plain films 09/12/2011
COMPARISON: Plain films 09/12/2011

<!--  IDXRADR:ADDEND:BEGIN -->Addendum Begins
<!--  IDXRADR:ADDEND:INNER_BEGIN -->***ADDENDUM*** CREATED: 09/12/2011 [DATE]

Critical Value/emergent results were called by telephone at the
time of interpretation on 09/12/2011  at [DATE] p.m.  to  Dr. Hoag,
who verbally acknowledged these results.
***END ADDENDUM*** SIGNED BY: Lacmanovic Cipele, M.D.
CLINICAL DATA: Upper abdominal pain.
CT ABDOMEN AND PELVIS WITHOUT CONTRAST
TECHNIQUE: Multidetector CT imaging of the abdomen and pelvis was
performed following the standard protocol without intravenous
contrast.

[Series 2: abd/pel w/o · axial · non-contrast · 0.97mm/px · z∈[+856,+1241]mm · 14 of 89 slices shown, 18 images]
[im 8/89  soft-tissue]
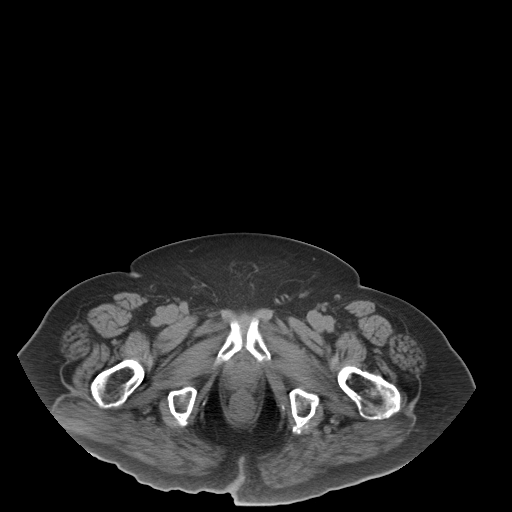
[im 8/89  bone]
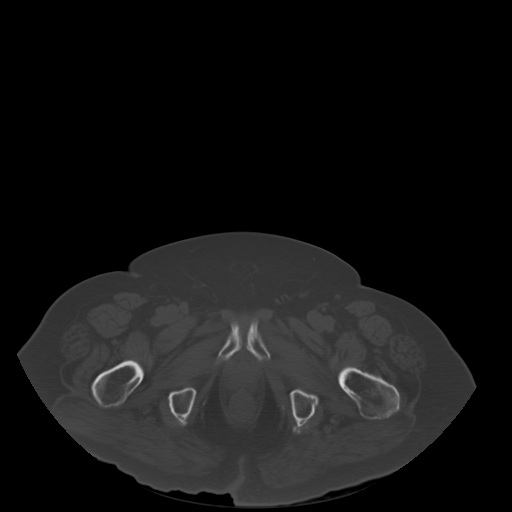
[im 15/89  soft-tissue]
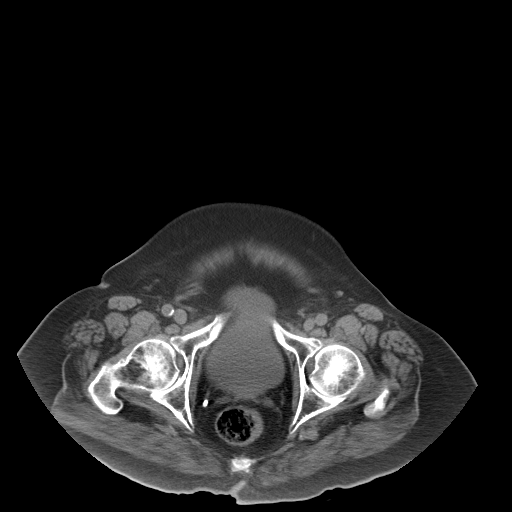
[im 22/89  soft-tissue]
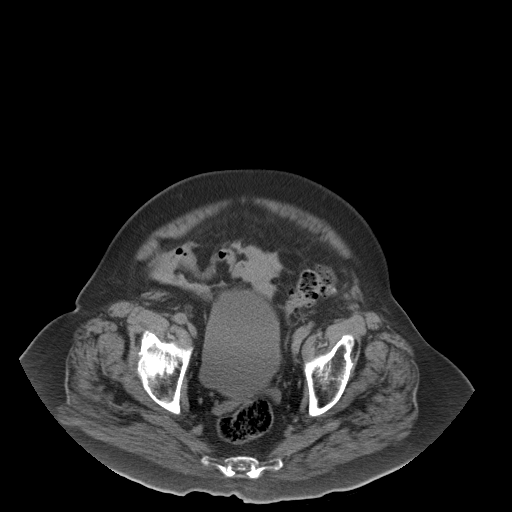
[im 29/89  soft-tissue]
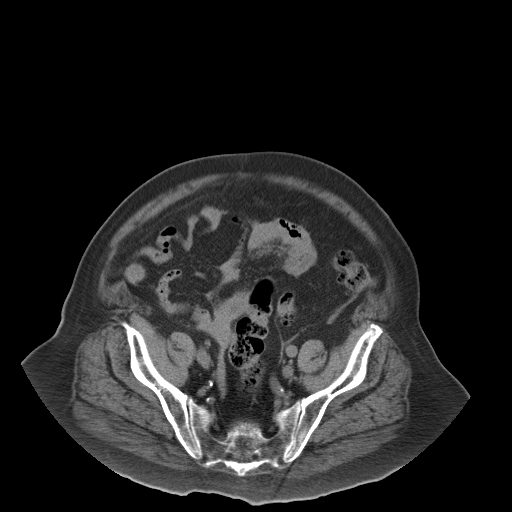
[im 36/89  soft-tissue]
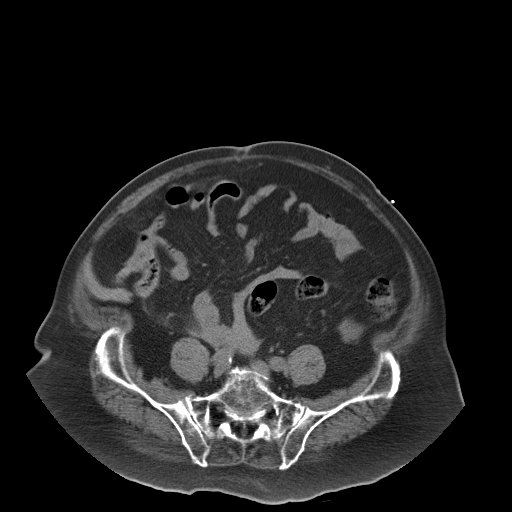
[im 43/89  soft-tissue]
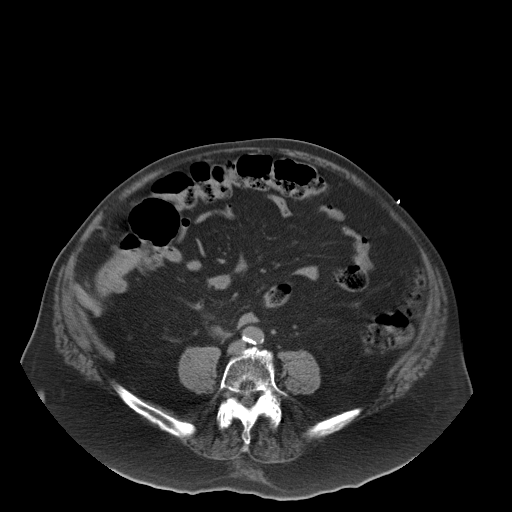
[im 50/89  soft-tissue]
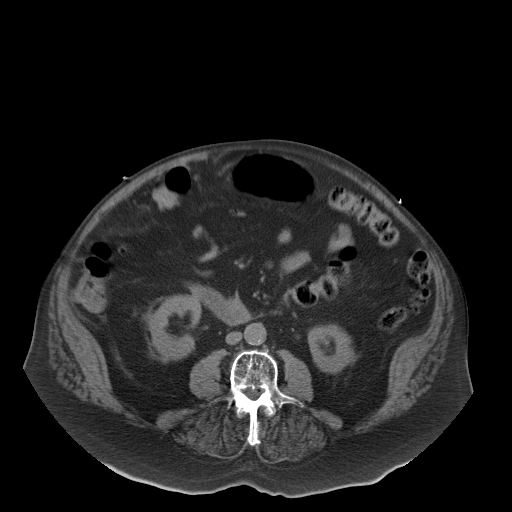
[im 57/89  soft-tissue]
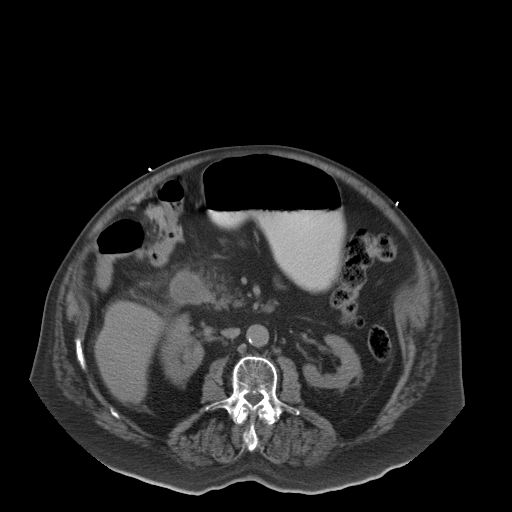
[im 64/89  soft-tissue]
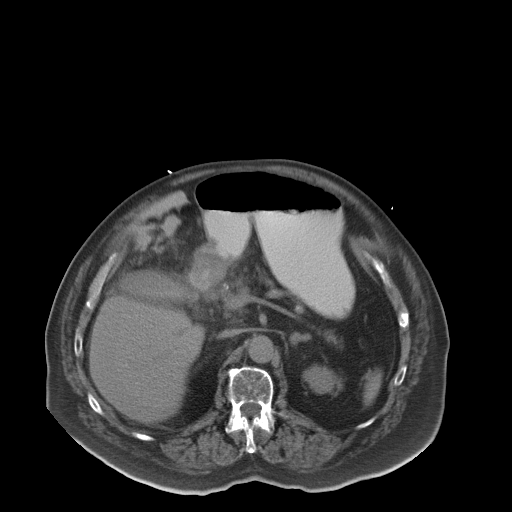
[im 64/89  bone]
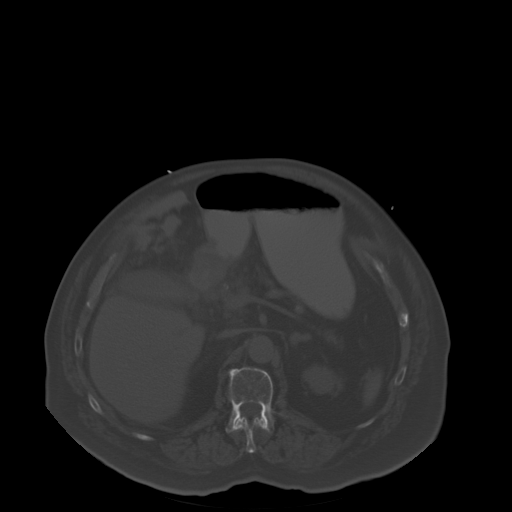
[im 71/89  soft-tissue]
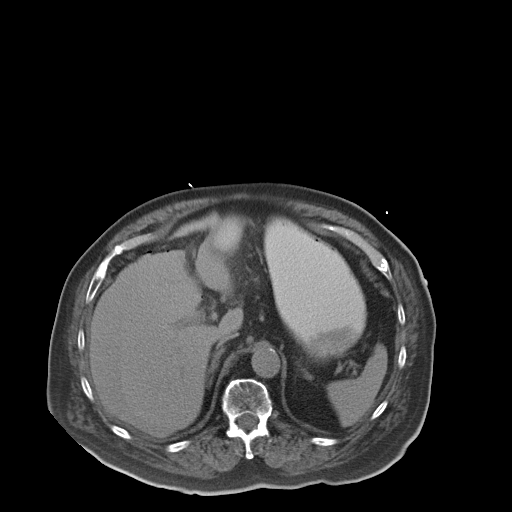
[im 74/89  lung]
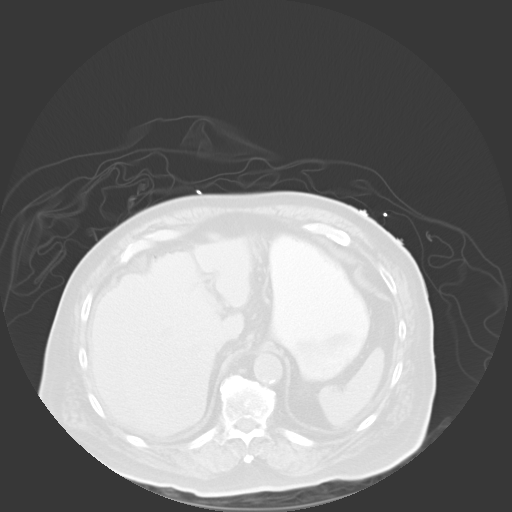
[im 78/89  soft-tissue]
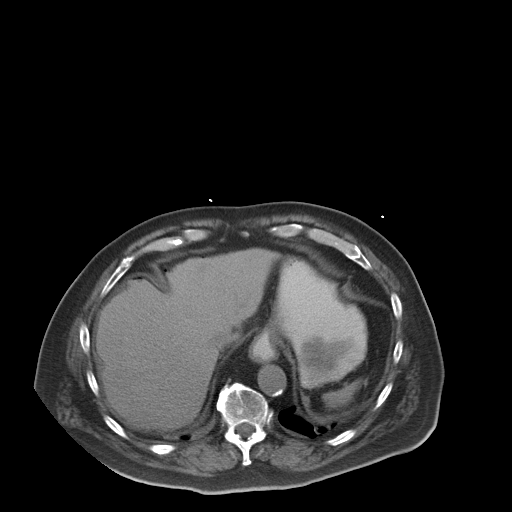
[im 78/89  lung]
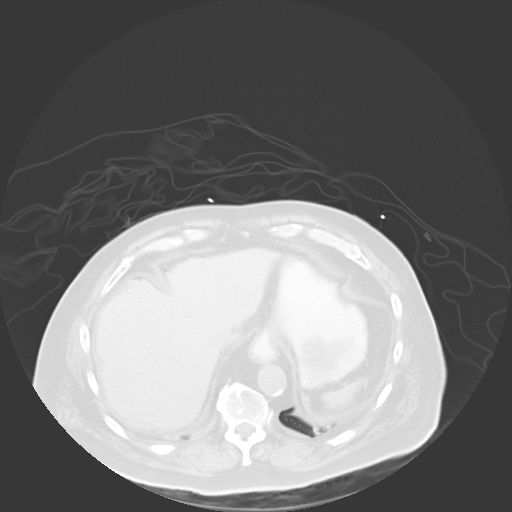
[im 81/89  lung]
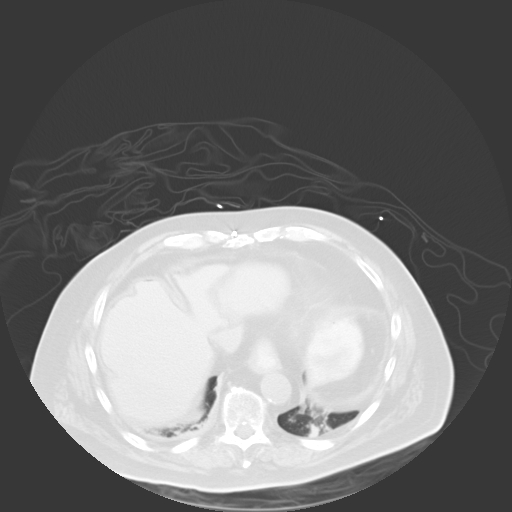
[im 85/89  soft-tissue]
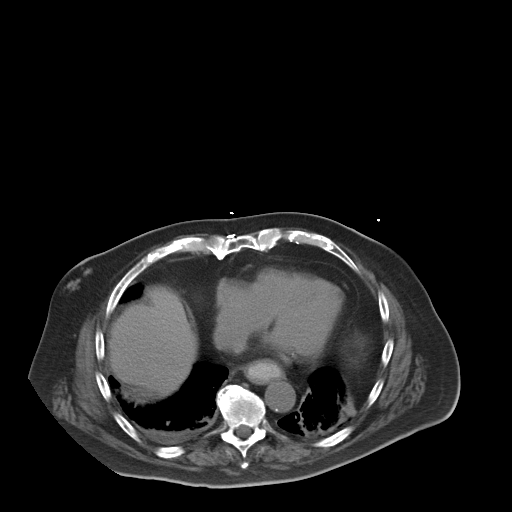
[im 85/89  lung]
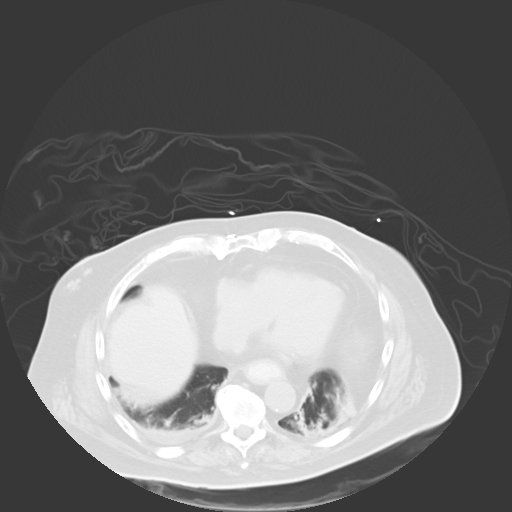

[14 of 32 positions shown; findings below may reference images not displayed]

FINDINGS: There are locules of extraluminal free air in the upper
abdomen adjacent to the liver edge.  Findings compatible with bowel
perforation.  The distal stomach appears abnormal with wall
thickening and surrounding stranding/inflammation.  This is
concerning for peptic ulcer disease and possibly the source of the
perforation.  Small amount of free fluid in the pelvis. There is
also perihepatic fluid which is nearly isointense to the liver and
difficult to visualize.  This could represent blood or extravasated
oral contrast.  No evidence of bowel obstruction.  Scattered
colonic diverticula.  No active diverticulitis.

Aorta and iliac vessels are calcified, non-aneurysmal.  There is a
small hiatal hernia. No focal lesion in the liver, spleen,
pancreas, adrenals or kidneys on this unenhanced study.  Pancreas
is atrophic.

Urinary bladder unremarkable.  No acute bony abnormality.
IMPRESSION: Locules of free air and high-density free fluid in the abdomen.
Abnormal pyloric region of the stomach and possibly involving the
first portion of the duodenum.  Findings concerning for perforated
peptic ulcer disease.

<!--  IDXRADR:ADDEND:INNER_END -->Addendum Ends
<!--  IDXRADR:ADDEND:END -->*RADIOLOGY REPORT*
FINDINGS: There are locules of extraluminal free air in the upper
abdomen adjacent to the liver edge.  Findings compatible with bowel
perforation.  The distal stomach appears abnormal with wall
thickening and surrounding stranding/inflammation.  This is
concerning for peptic ulcer disease and possibly the source of the
perforation.  Small amount of free fluid in the pelvis. There is
also perihepatic fluid which is nearly isointense to the liver and
difficult to visualize.  This could represent blood or extravasated
oral contrast.  No evidence of bowel obstruction.  Scattered
colonic diverticula.  No active diverticulitis.

Aorta and iliac vessels are calcified, non-aneurysmal.  There is a
small hiatal hernia. No focal lesion in the liver, spleen,
pancreas, adrenals or kidneys on this unenhanced study.  Pancreas
is atrophic.

Urinary bladder unremarkable.  No acute bony abnormality.
IMPRESSION: Locules of free air and high-density free fluid in the abdomen.
Abnormal pyloric region of the stomach and possibly involving the
first portion of the duodenum.  Findings concerning for perforated
peptic ulcer disease.

## 2012-12-11 IMAGING — CR DG CHEST 1V PORT
1 series · 1 of 1 positions shown · non-contrast
Comparison: 01/23/2008

CLINICAL DATA: Endotracheal tube placement

PORTABLE CHEST - 1 VIEW

[AP]
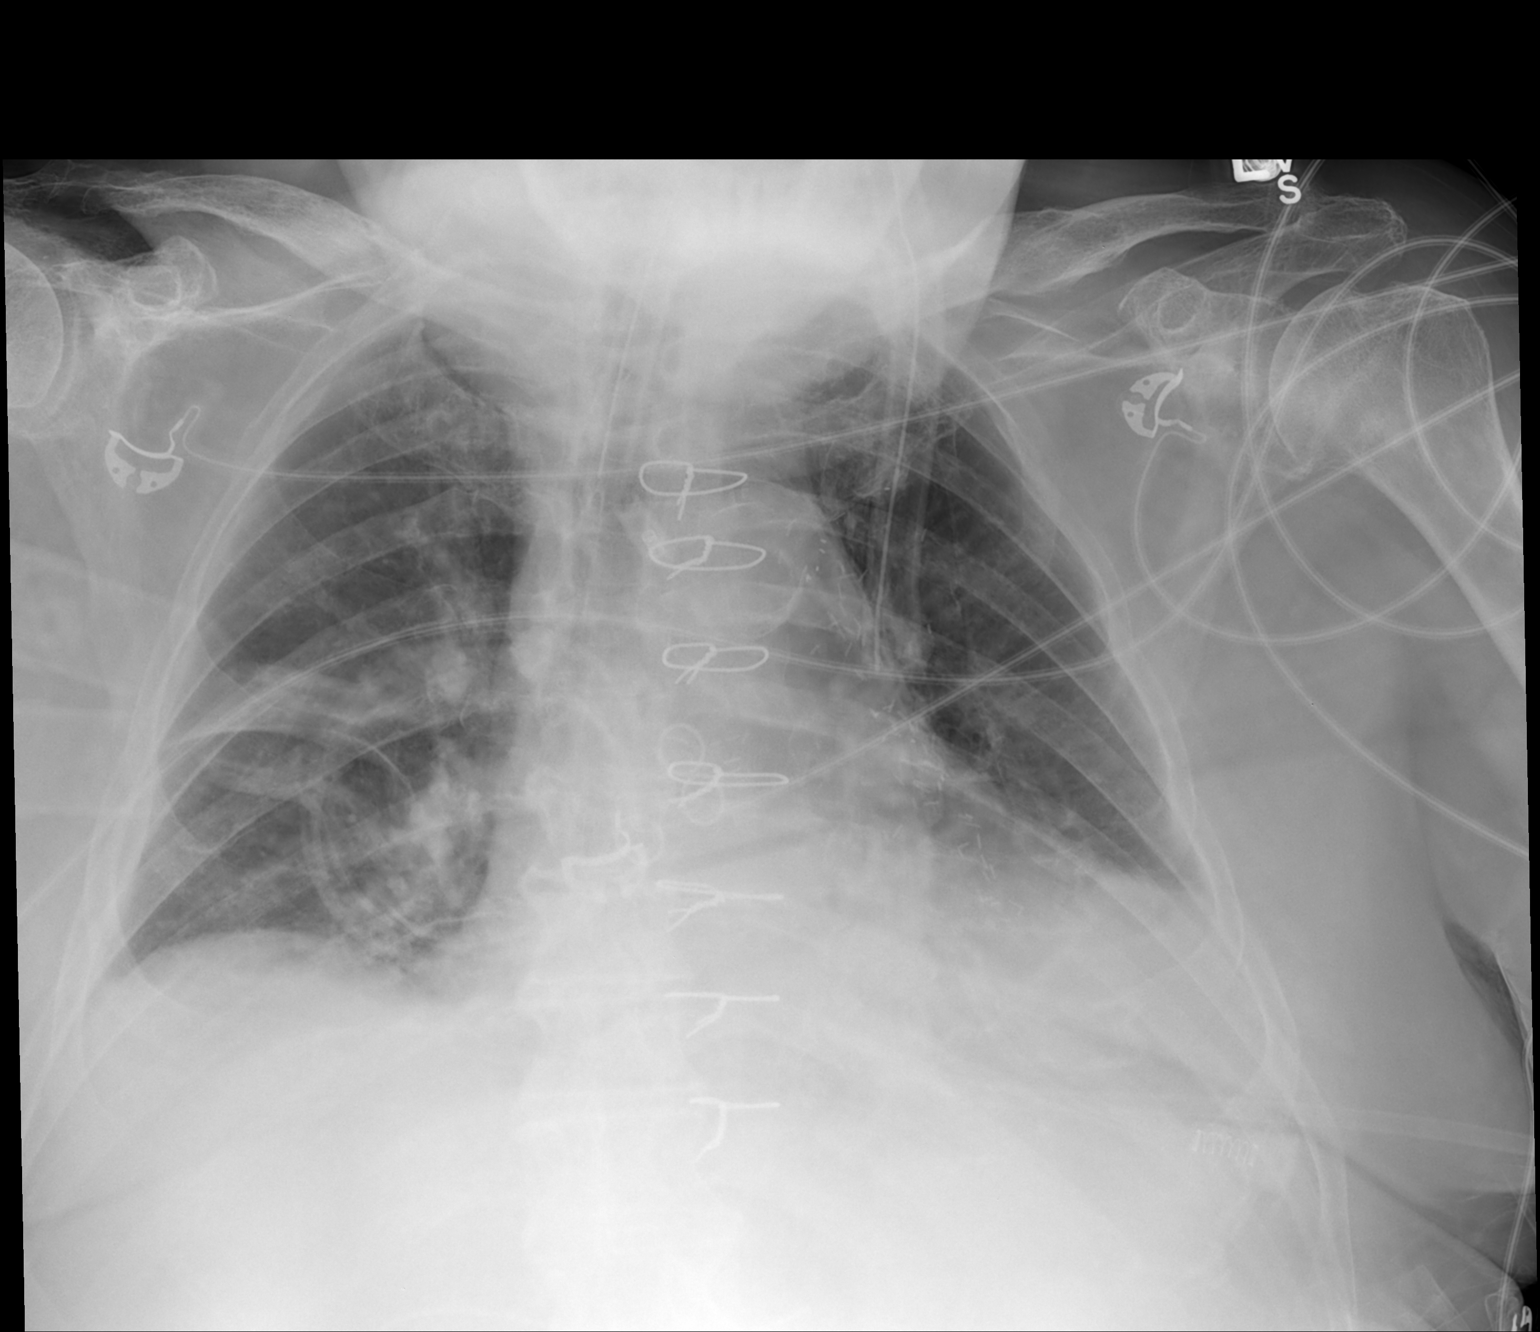

[1 of 1 positions shown; findings below may reference images not displayed]

FINDINGS: Endotracheal tube was placed with tip about 4.5 cm above
the carina.  Postoperative changes in the mediastinum with
sternotomy wires and surgical clips and vascular markers present.
Shallow inspiration.  Linear atelectasis in the lung bases and
right midlung.  No apparent pneumothorax.  Calcified and tortuous
aorta.  Degenerative changes in the shoulders.
IMPRESSION: Endotracheal tube tip is about 4.5 cm above the carina.  Shallow
inspiration with basilar infiltration or atelectasis bilaterally.

## 2012-12-12 IMAGING — CR DG CHEST 1V PORT
1 series · 1 of 1 positions shown · non-contrast
Comparison: 09/12/2011

CLINICAL DATA: Line placement

PORTABLE CHEST - 1 VIEW

[AP]
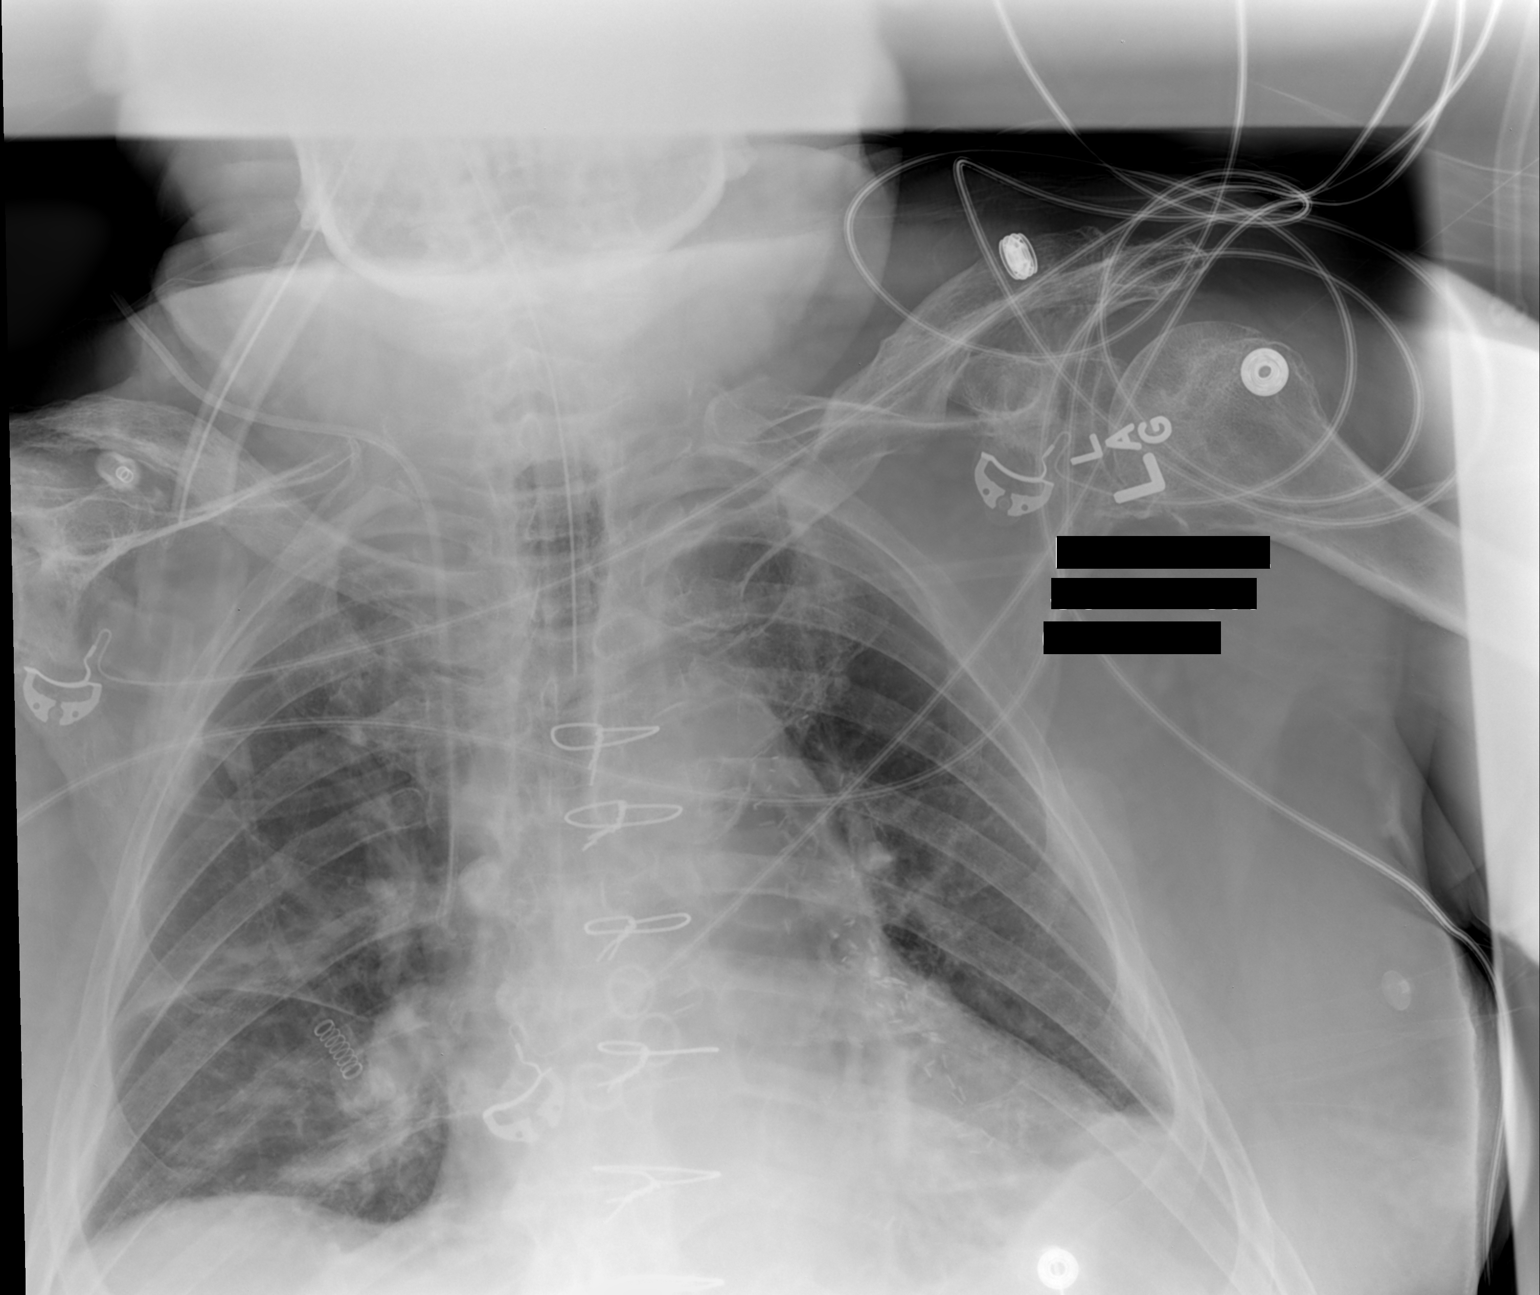

[1 of 1 positions shown; findings below may reference images not displayed]

FINDINGS: Stable postoperative changes in the mediastinum.
Endotracheal tube with tip about 5.3 cm above the carina.  Interval
placement of right central venous catheter with tip over the mid
SVC region.  No pneumothorax.  Shallow inspiration with atelectasis
in the lung bases.  Mild cardiac enlargement.  Degenerative changes
in the spine and shoulders.  Old fracture deformity of the left
clavicle.
IMPRESSION: Appliances in satisfactory apparent location.  Shallow inspiration
with basilar atelectasis.

## 2012-12-13 IMAGING — CR DG CHEST 1V PORT
1 series · 1 of 1 positions shown · non-contrast
Comparison: 09/13/2011; 09/11/1937; 01/23/2008

CLINICAL DATA: Intubated patient of

PORTABLE CHEST - 1 VIEW

[AP]
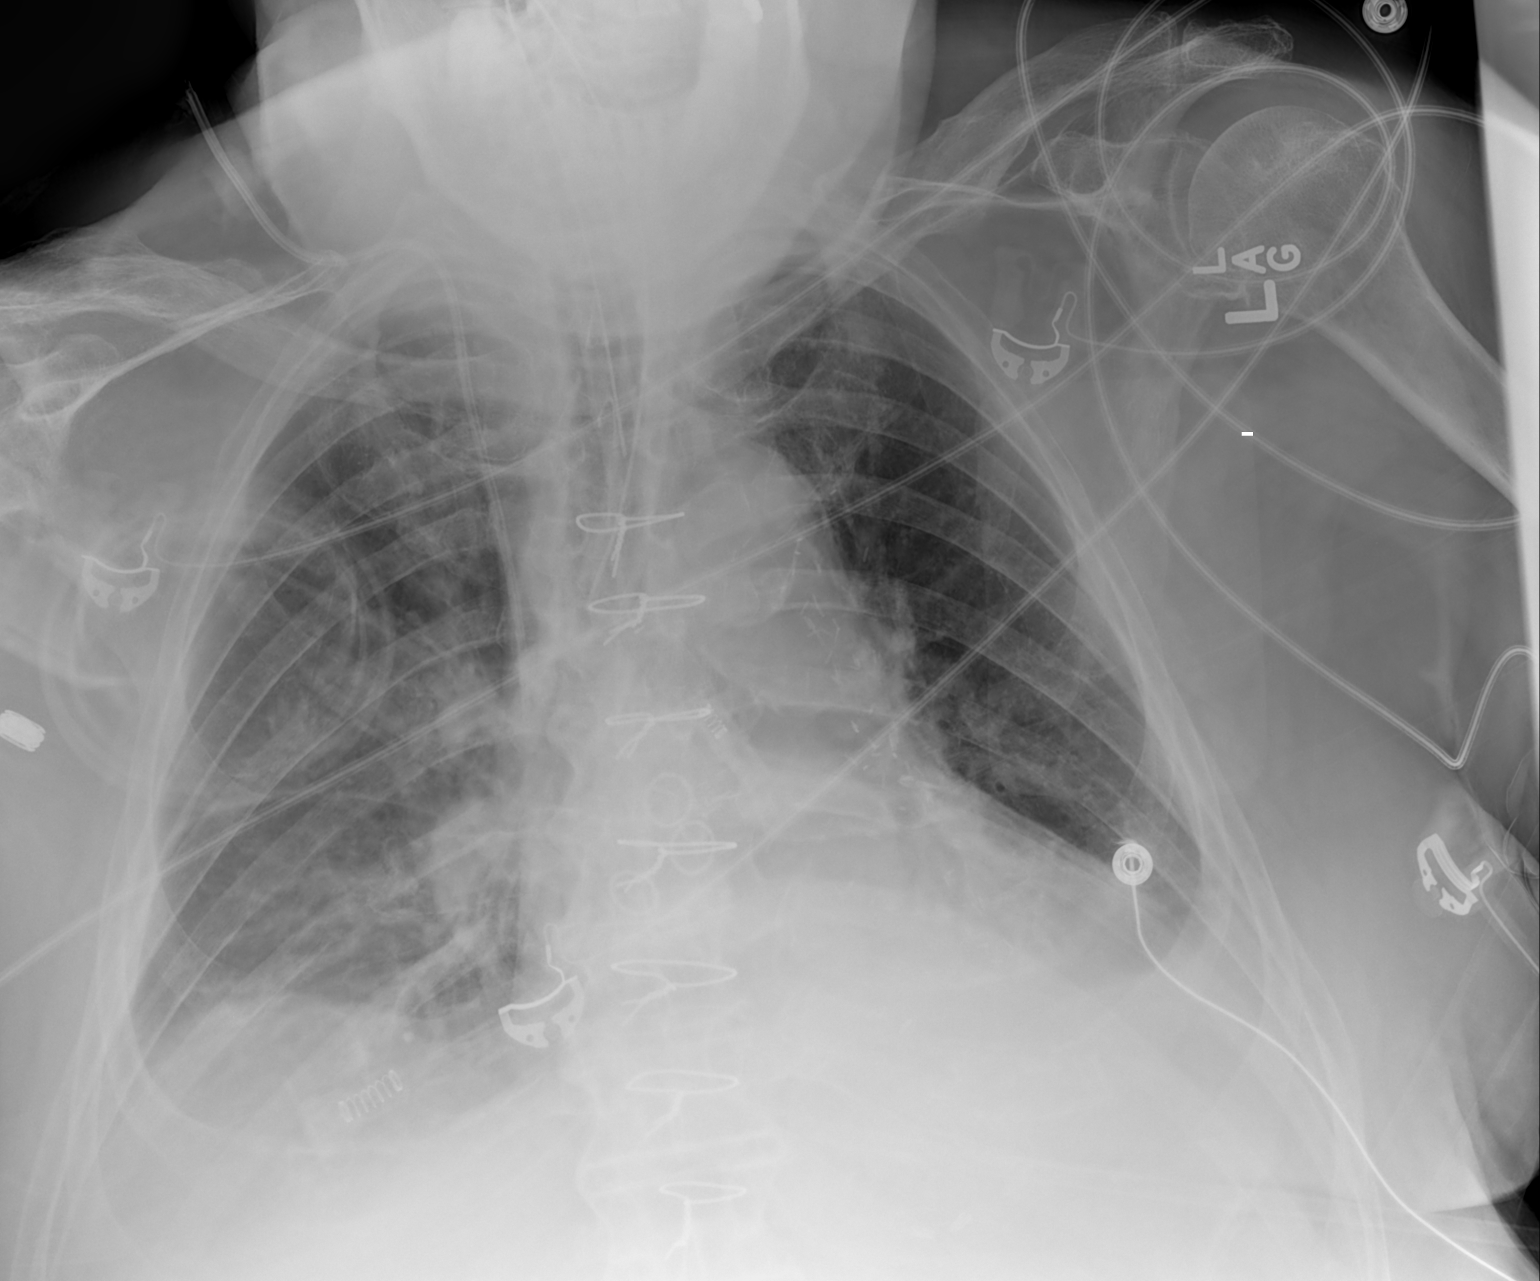

[1 of 1 positions shown; findings below may reference images not displayed]

FINDINGS: Grossly unchanged enlarged cardiac silhouette and mediastinal
contours with atherosclerotic calcifications of the thoracic aorta.
Post median sternotomy and CABG. Endotracheal tube overlies
tracheal air column with tips through the carina.  Interval
placement of enteric tube with tip overlying the superior/mid
esophagus.  Unchanged positioning of right jugular approach central
venous catheter with tip overlying the superior cavoatrial
junction.  Mild cephalization of flow without frank evidence of
pulmonary edema.  There is blunting of bilateral costophrenic
angles and grossly unchanged bibasilar heterogeneous /
consolidative opacities, left greater than right.  No definite
pneumothorax.  Unchanged bones.
IMPRESSION: 1.  Interval placement of enteric tube with tip over the
superior/mid esophagus.  Repositioning and repeat radiograph is
recommended.
2.  Otherwise, stable position of support apparatus.  No
pneumothorax.
3.  Grossly unchanged low lung volumes, small effusions and
bibasilar opacities, atelectasis versus infiltrate.

This is made call report.

## 2012-12-14 IMAGING — CR DG CHEST 1V PORT
1 series · 1 of 1 positions shown · non-contrast
Comparison: 09/14/2011

CLINICAL DATA: Endotracheal tube

PORTABLE CHEST - 1 VIEW

[AP]
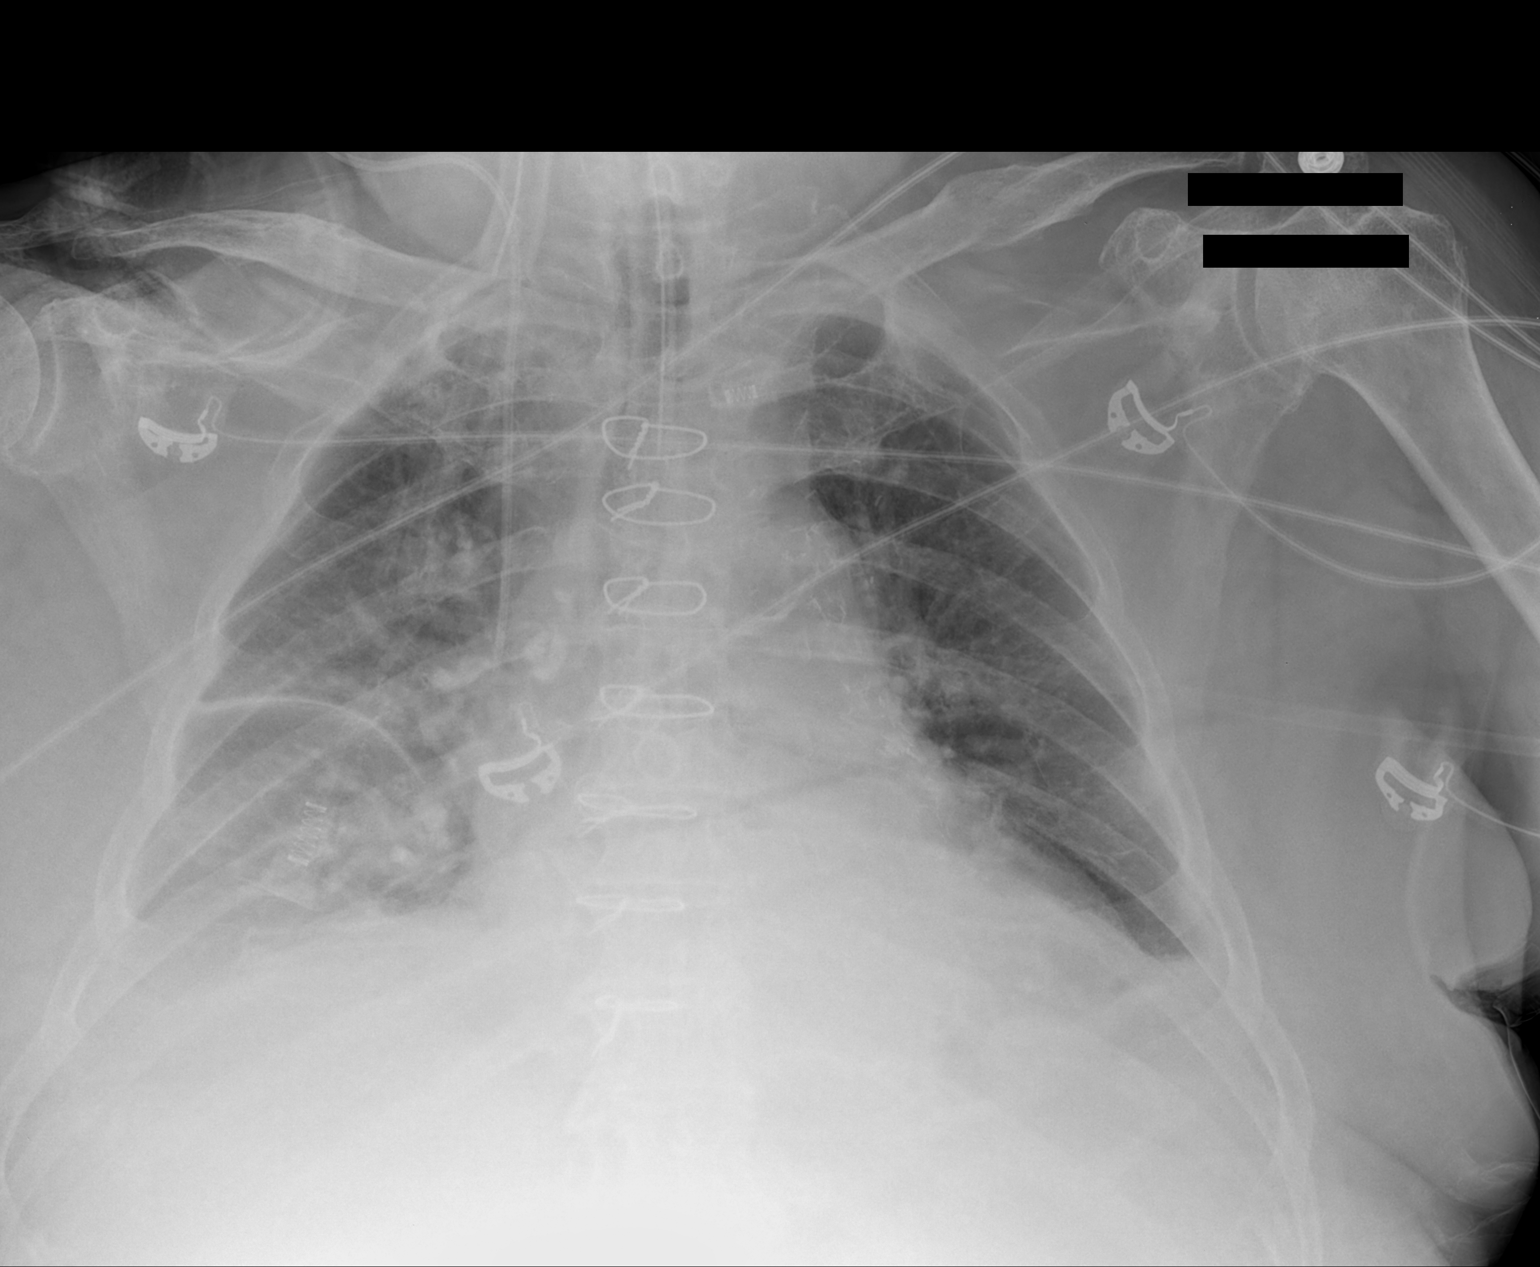

[1 of 1 positions shown; findings below may reference images not displayed]

FINDINGS: Hypoaerated, results in interstitial and vascular
crowding.  Right IJ catheter tip projects over the distal SVC.
Endotracheal tube tip projects 5.6 cm proximal to the carina.
Right greater than left lung base opacities.  No pneumothorax.
Status post median sternotomy and CABG.  No acute osseous
abnormality.
IMPRESSION: Endotracheal tube tip projects 5.6 cm proximal to the carina.

Status post median sternotomy and CABG.

Bibasilar opacities; atelectasis versus infiltrate.

## 2012-12-16 IMAGING — CR DG CHEST 1V PORT
1 series · 1 of 1 positions shown · non-contrast
Comparison: Chest radiograph performed earlier today at [DATE] a.m.

CLINICAL DATA: Shortness of breath and wheezing; question of
stridor.

PORTABLE CHEST - 1 VIEW

[AP]
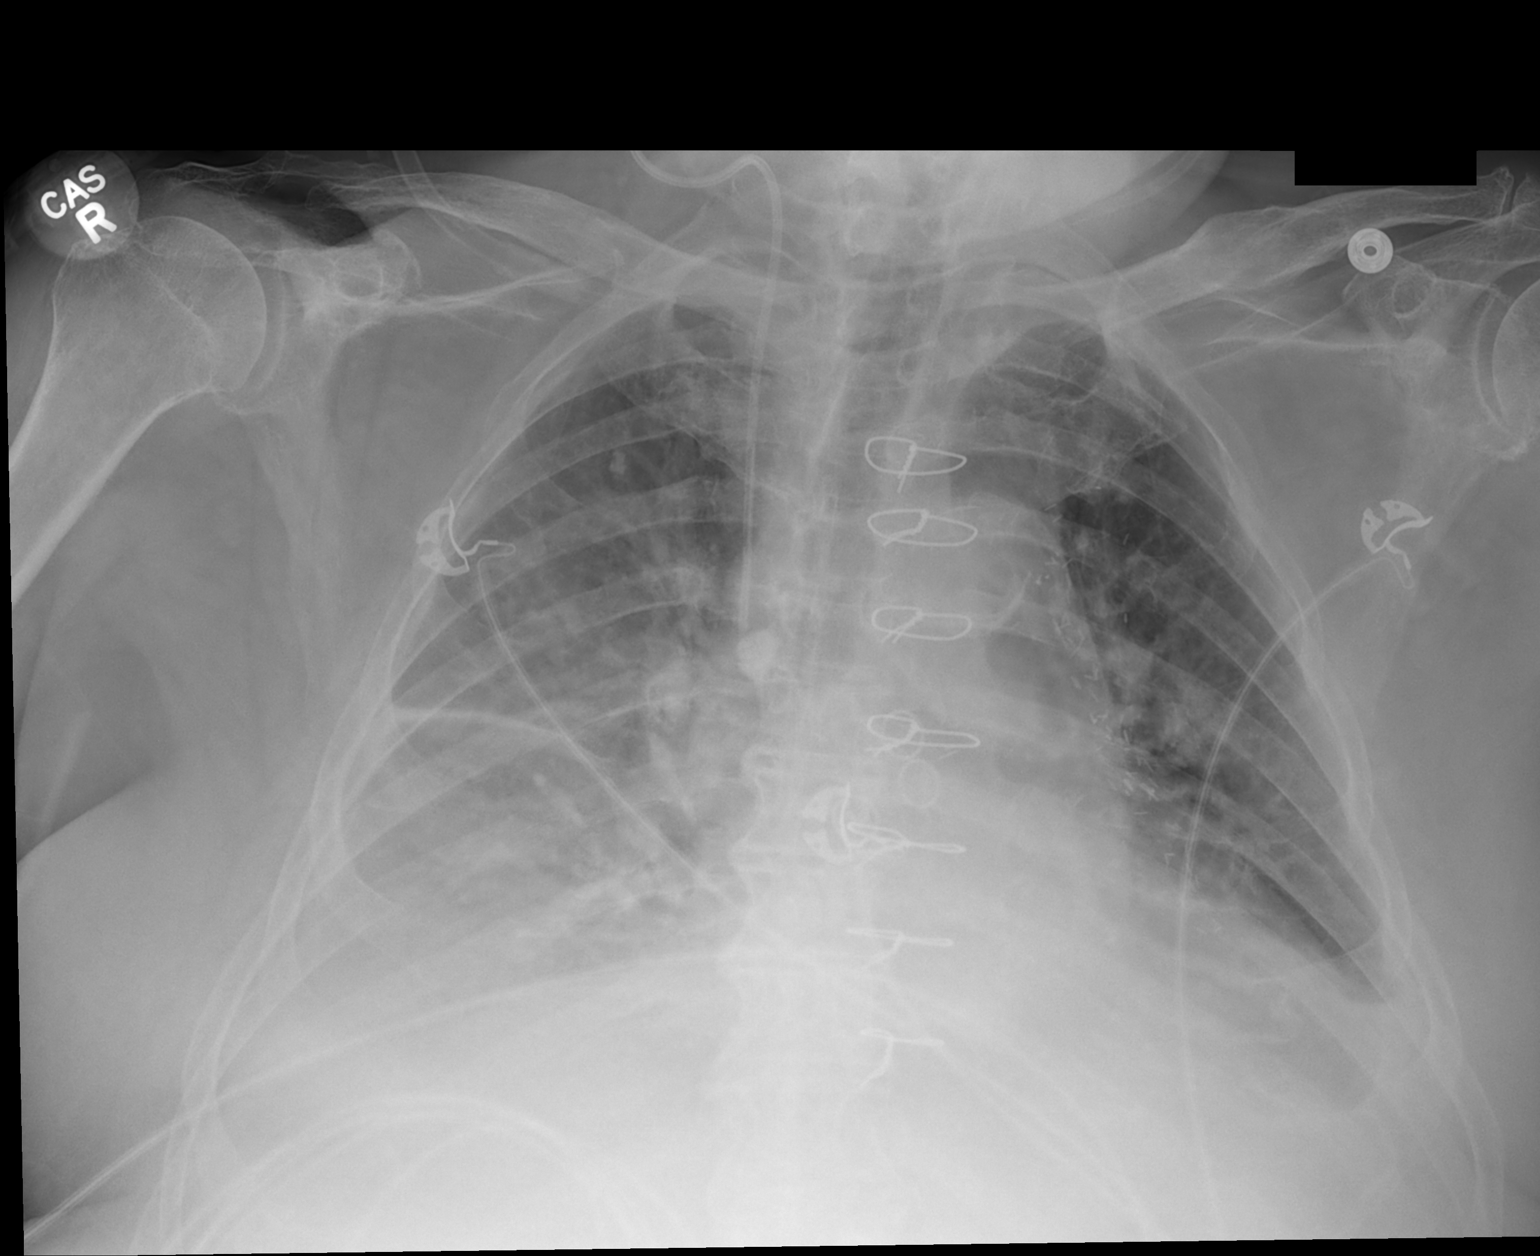

[1 of 1 positions shown; findings below may reference images not displayed]

FINDINGS: The lungs are well-aerated.  There are persistent small
bilateral pleural effusions, similar in appearance to the prior
study, with hazy bilateral airspace opacity, worse on the right.
As before, this most likely reflects pulmonary edema, right worse
than left.  Underlying vascular congestion is noted.  No
pneumothorax is seen.

The cardiomediastinal silhouette is mildly enlarged; the patient is
status post sternotomy, with evidence of prior CABG.  A right IJ
line is noted ending about the mid SVC.  No acute osseous
abnormalities are seen; chronic bony remodelling and resorption are
noted at the distal right clavicle.
IMPRESSION: Stable appearance to pulmonary edema, right worse than left, with
small bilateral pleural effusions; underlying vascular congestion
and mild cardiomegaly noted.

## 2012-12-16 IMAGING — CR DG ABDOMEN 1V
2 series · 2 of 2 positions shown · non-contrast
Comparison: CT 09/12/2011

CLINICAL DATA: Jejunostomy tube placement

ABDOMEN - 1 VIEW

[ap (kub) (1 of 2)]
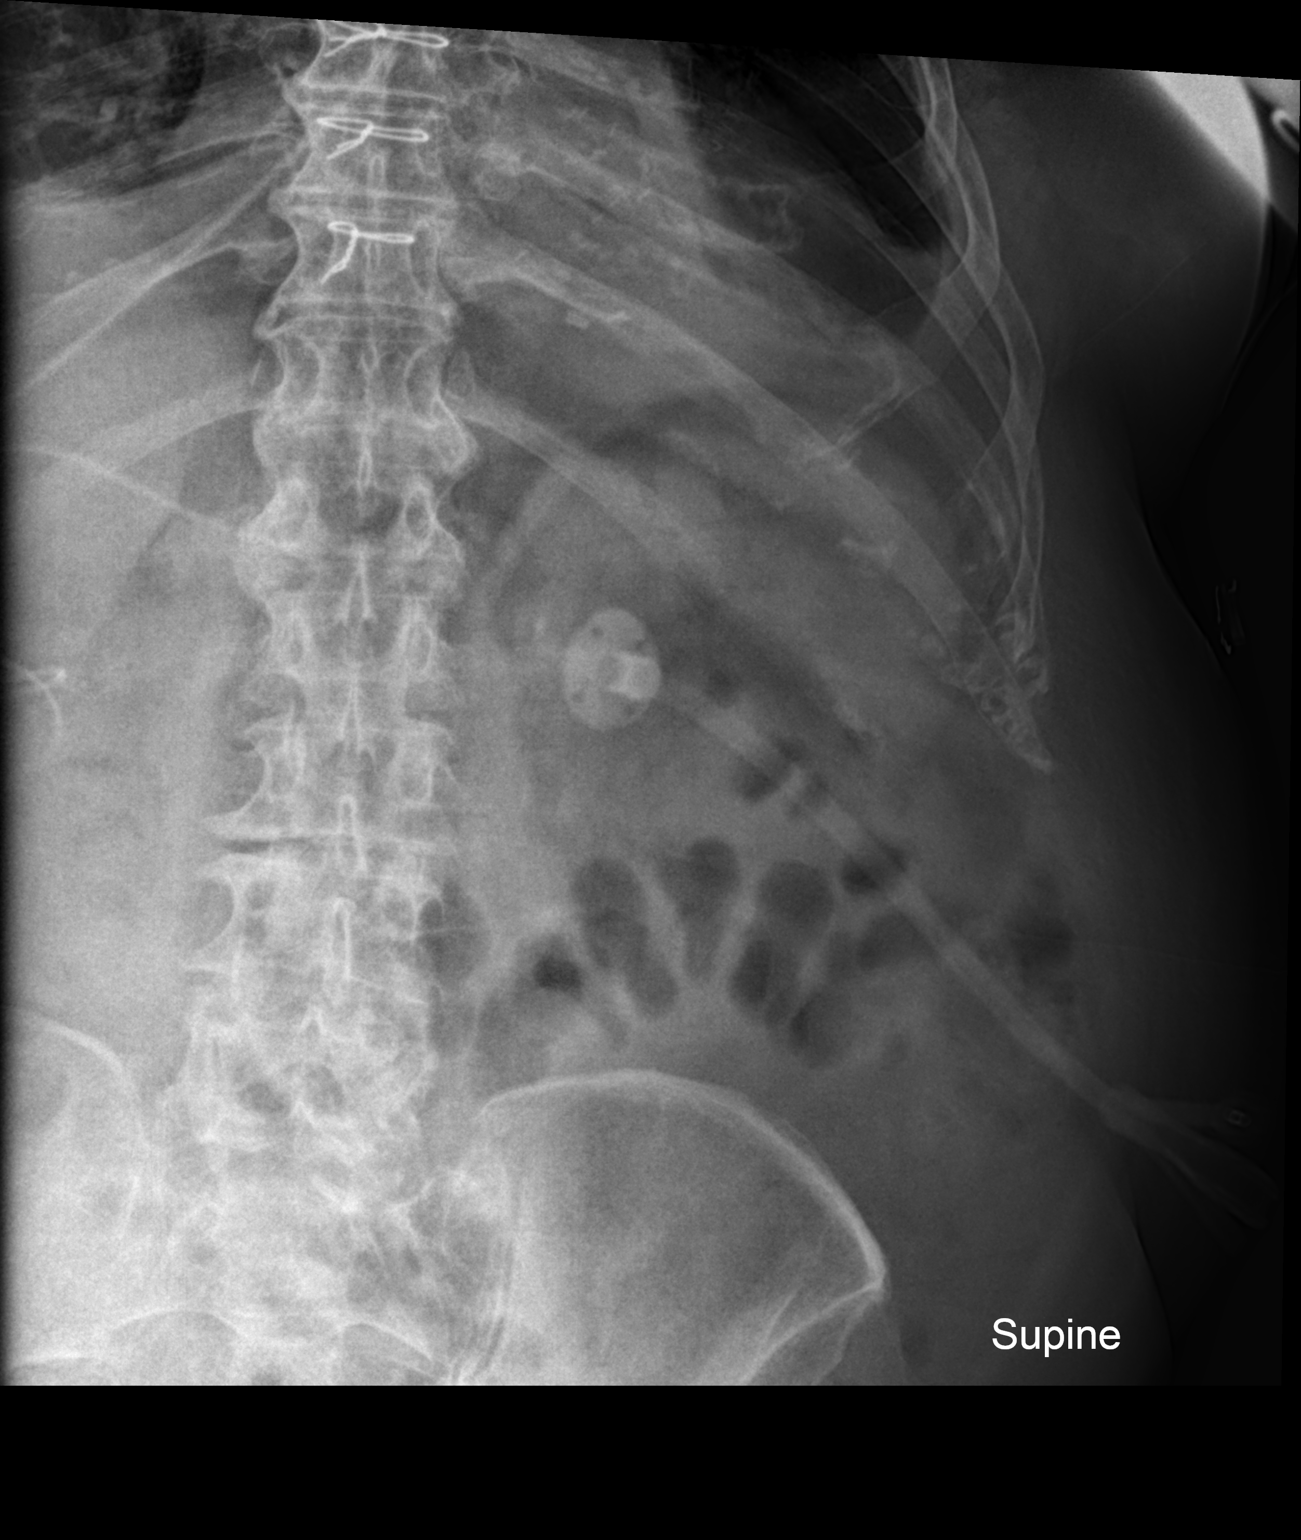

[ap (kub) (2 of 2)]
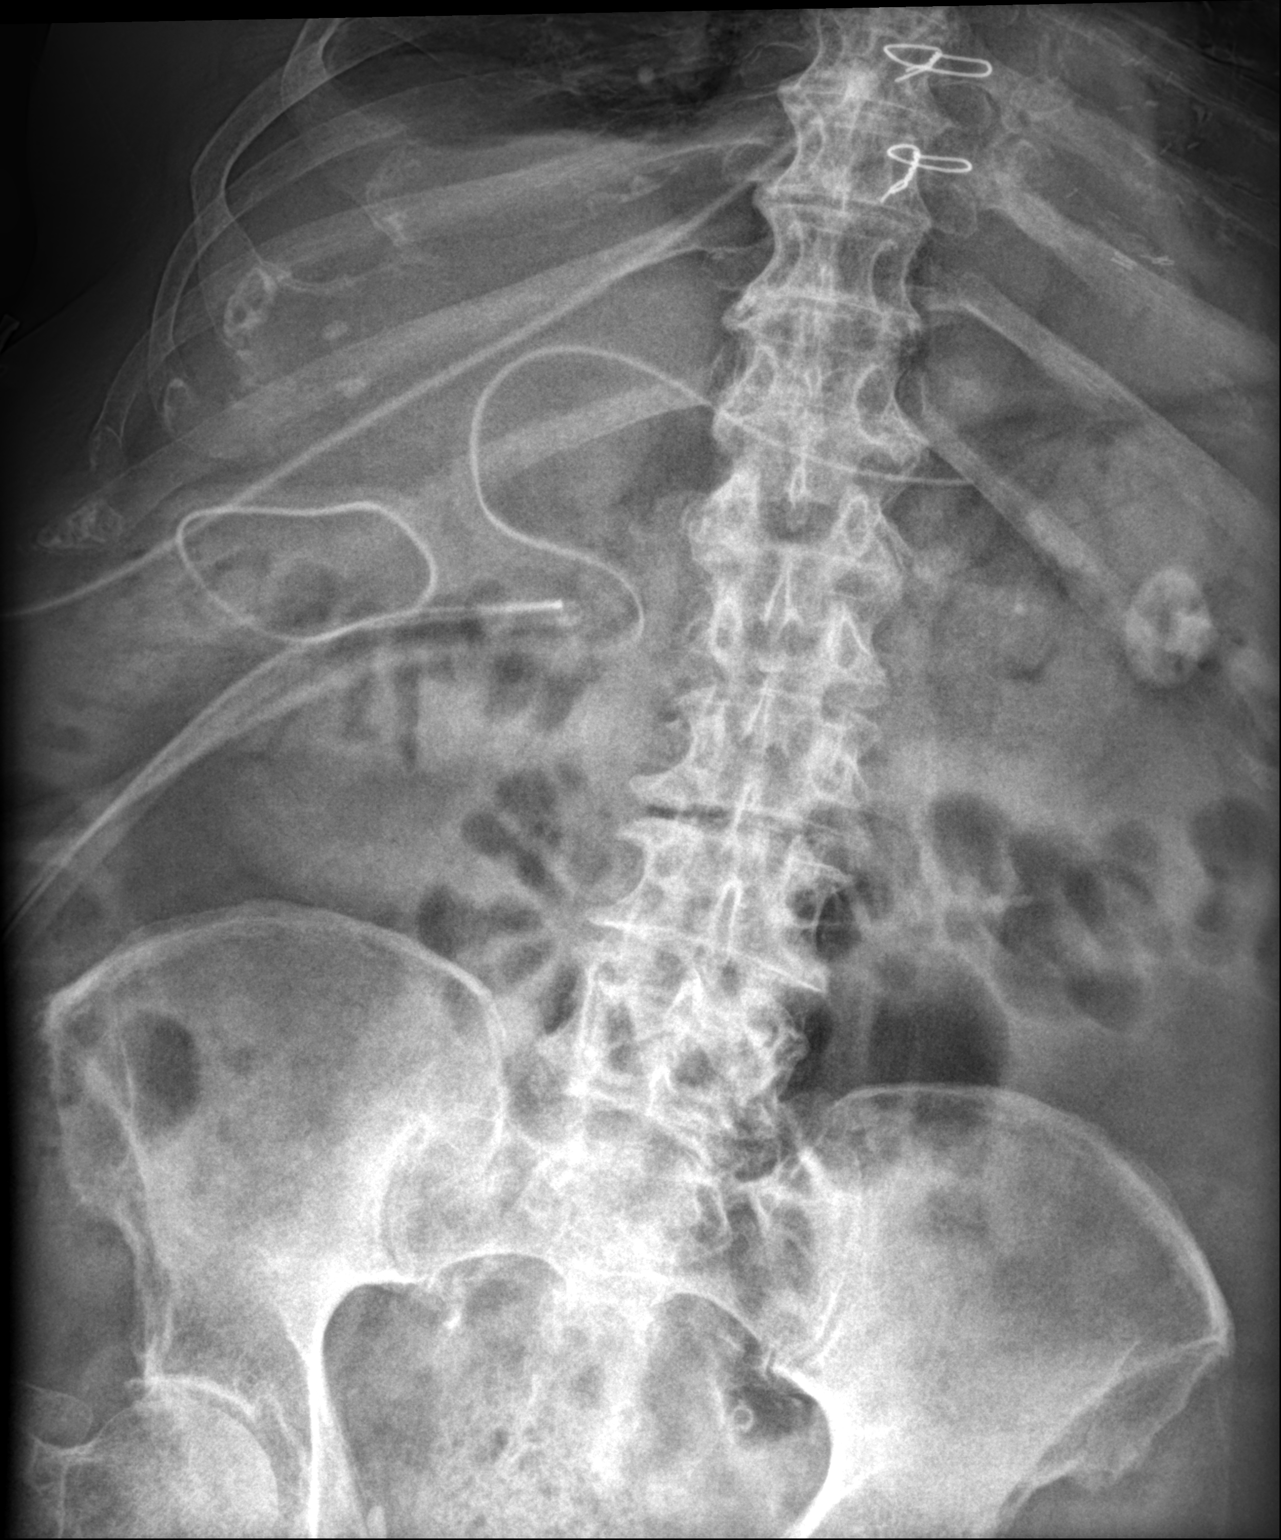

[2 of 2 positions shown; findings below may reference images not displayed]

FINDINGS: Gastrostomy tube is plate in place overlying the stomach.
The surgical note indicated a gastric jejunostomy tube was  placed
through the gastrostomy tube however I do not see this tubing.

Two  tubes are present overlying the right abdomen in the
subhepatic space and extending in the epigastric area.  These may
be  surgical drainage tubes.

Negative for bowel obstruction.
IMPRESSION: Gastrostomy tube is in good position overlying the stomach region.
Gastric jejunostomy tube not identified.

Two surgical tubes are present on the right which may be surgical
drains.

## 2012-12-16 IMAGING — CR DG CHEST 1V PORT
1 series · 1 of 1 positions shown · non-contrast
Comparison: 09/15/2011.

CLINICAL DATA: Atelectasis.

PORTABLE CHEST - 1 VIEW

[AP]
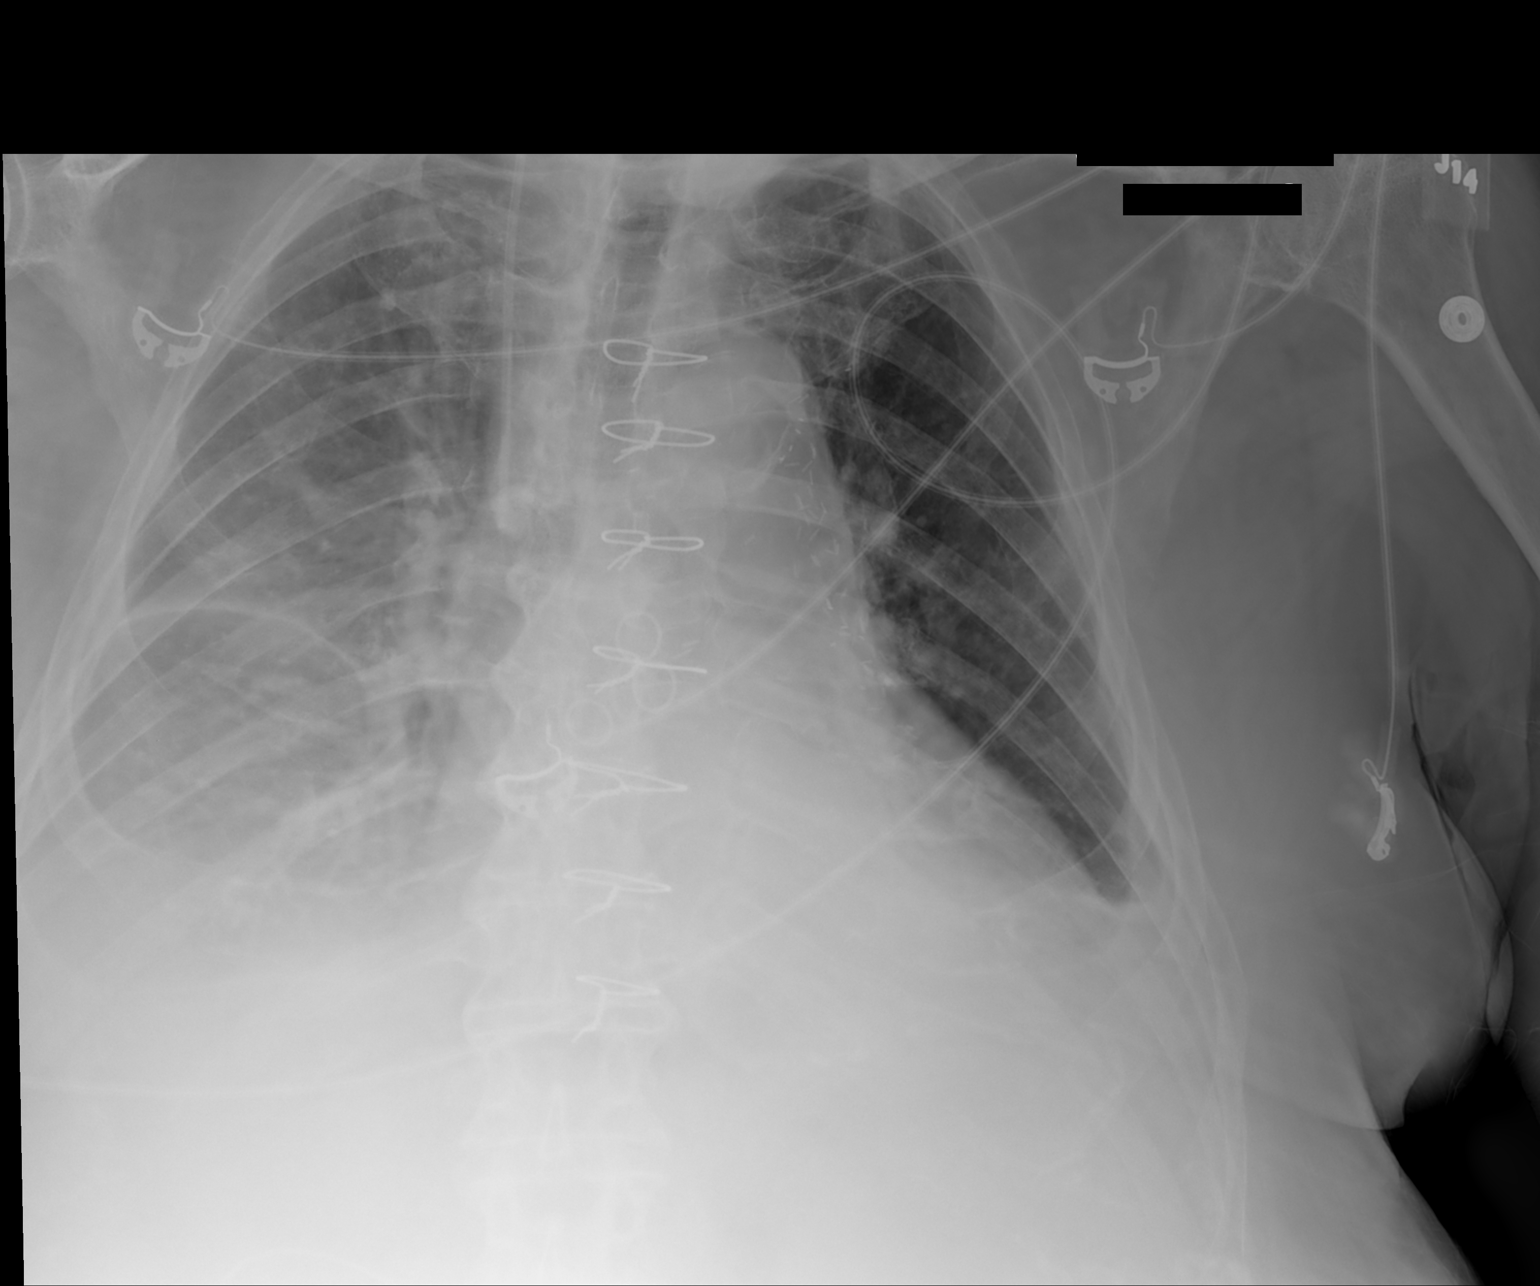

[1 of 1 positions shown; findings below may reference images not displayed]

FINDINGS: Right IJ central line projects in the SVC.  The patient
has been extubated in the interval.  Trachea is midline.  Heart
size stable.  There are bilateral pleural effusions with mixed
interstitial and airspace disease, basilar dependent.
IMPRESSION: Probable congestive heart failure, stable.

## 2012-12-17 IMAGING — RF DG UGI W/ GASTROGRAFIN
11 series · 11 of 11 positions shown · non-contrast
Comparison: None.

CLINICAL DATA: Postop from repair of perforated duodenal ulcer.
Evaluate for postop anastomotic leak.

UPPER GI SERIES WITHOUT KUB
TECHNIQUE: Routine upper GI series was performed with water-
soluble Hmnipaque-B11.
Fluoroscopy Time: 1.3 minutes

[Series 1: run · 1 of 1 slices shown (1 of 11)]
[im 1/1]
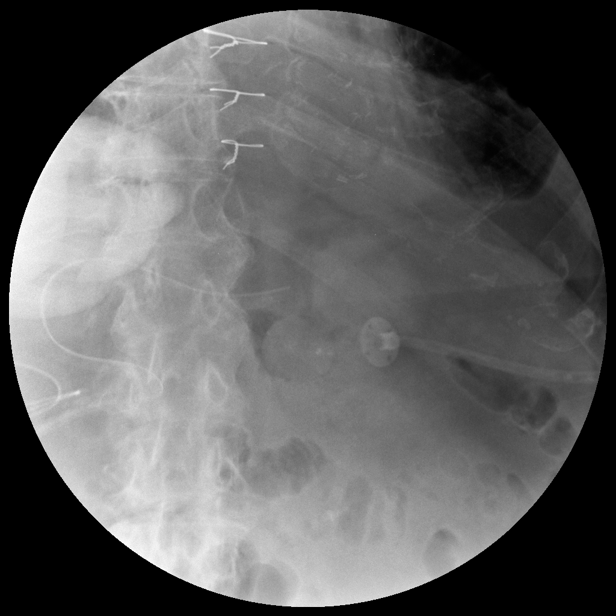

[Series 2: run · 1 of 1 slices shown (2 of 11)]
[im 1/1]
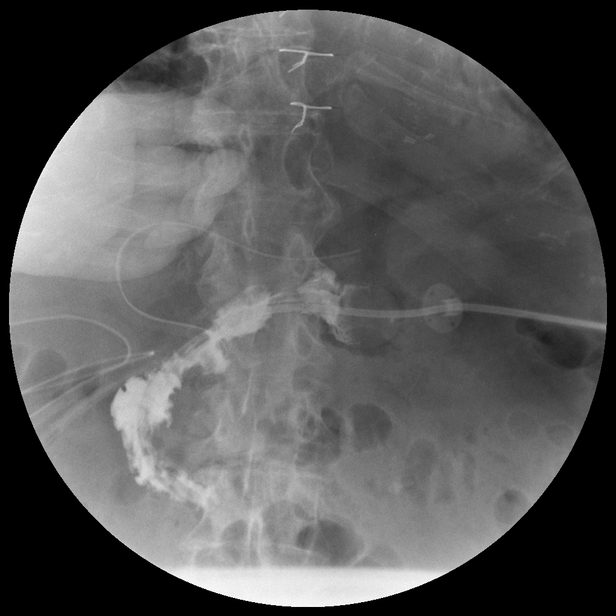

[Series 3: run · 1 of 1 slices shown (3 of 11)]
[im 1/1]
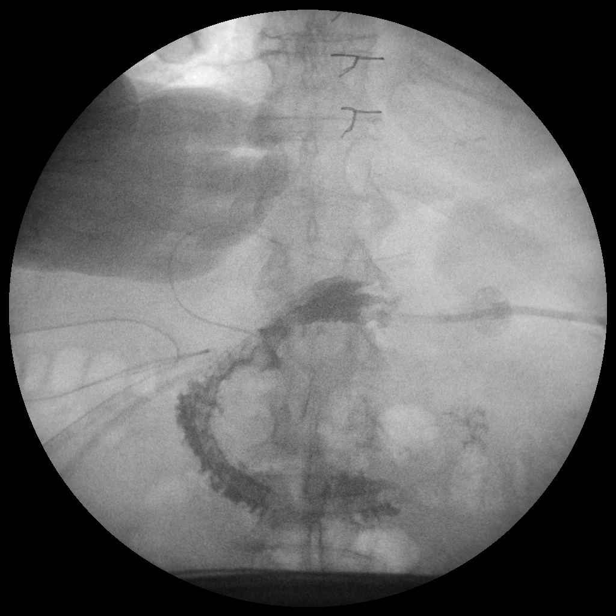

[Series 4: run · 1 of 1 slices shown (4 of 11)]
[im 1/1]
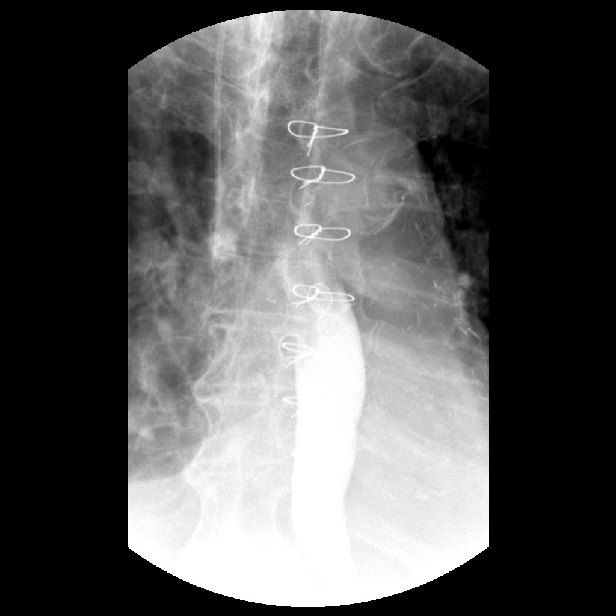

[Series 5: run · 1 of 1 slices shown (5 of 11)]
[im 1/1]
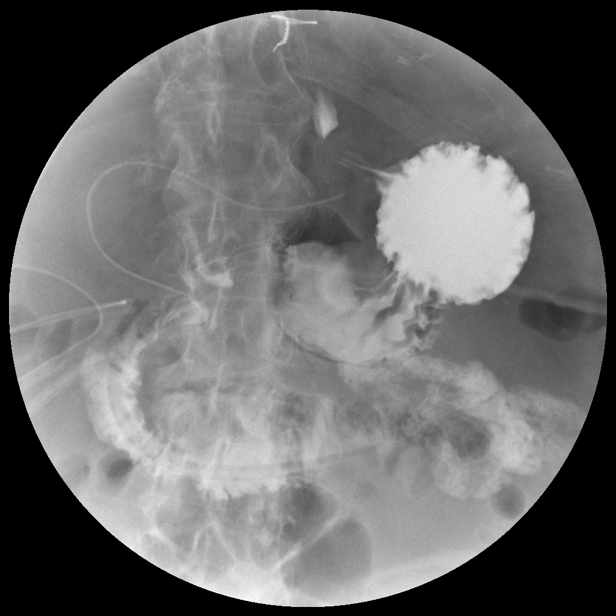

[Series 6: run · 1 of 1 slices shown (6 of 11)]
[im 1/1]
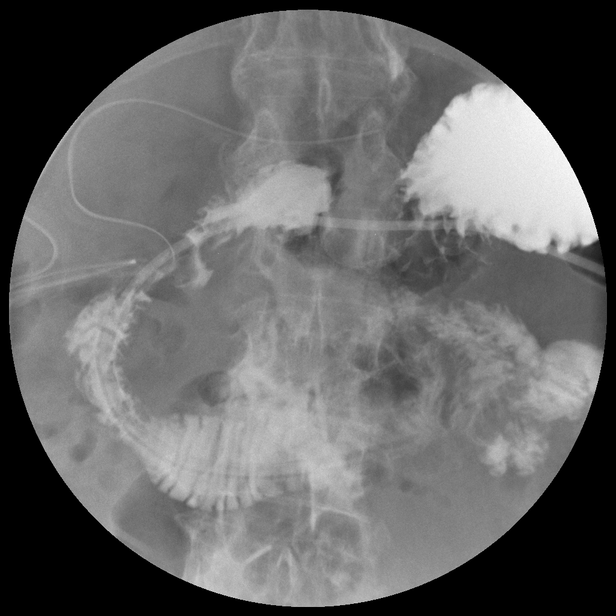

[Series 7: run · 1 of 1 slices shown (7 of 11)]
[im 1/1]
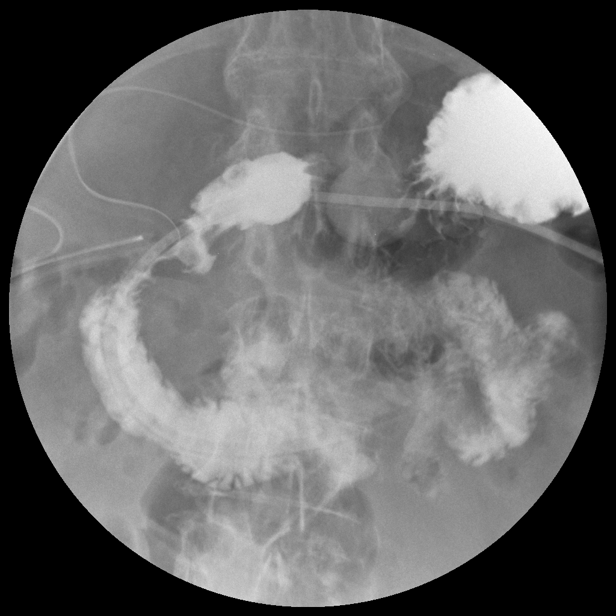

[Series 8: run · 1 of 1 slices shown (8 of 11)]
[im 1/1]
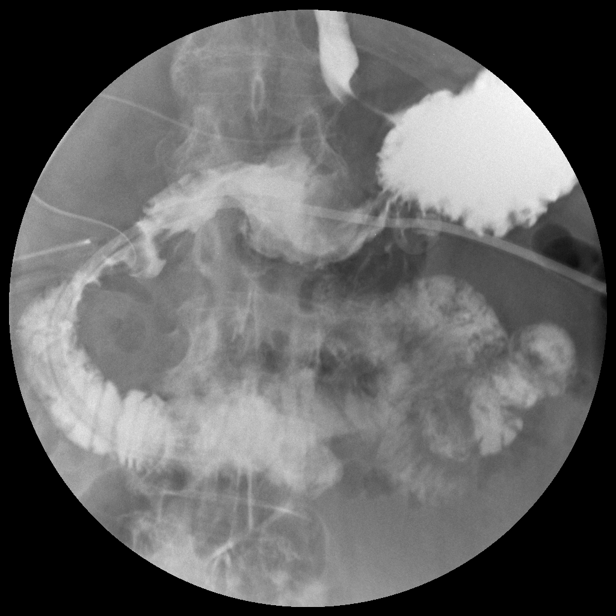

[Series 9: run · 1 of 1 slices shown (9 of 11)]
[im 1/1]
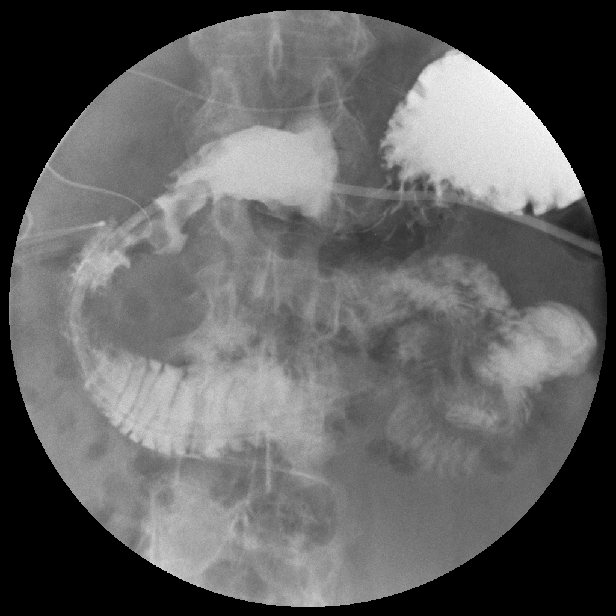

[Series 10: run · 1 of 1 slices shown (10 of 11)]
[im 1/1]
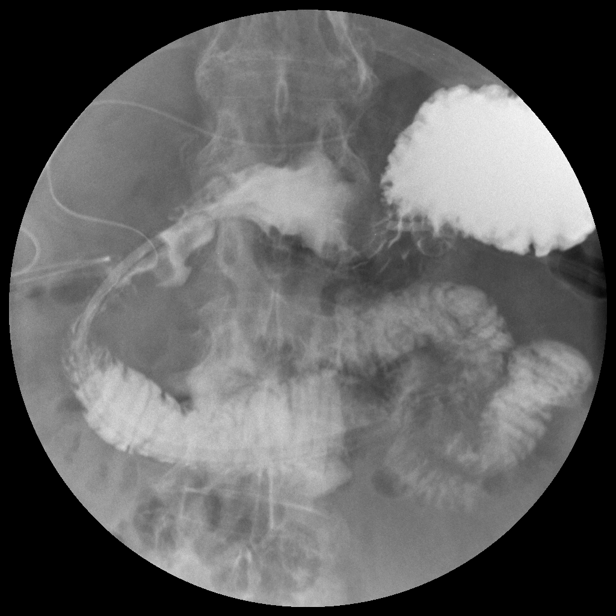

[Series 11: run · 1 of 1 slices shown (11 of 11)]
[im 1/1]
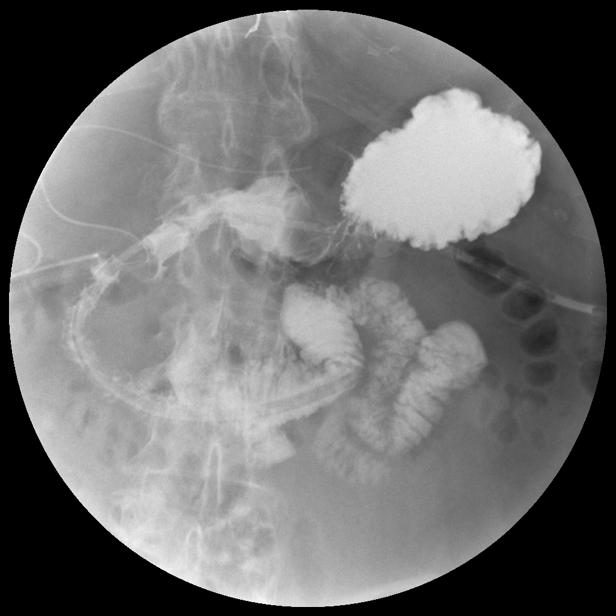

[11 of 11 positions shown; findings below may reference images not displayed]

FINDINGS: No evidence of esophageal mass or stricture.  No evidence
of hiatal hernia.

Evaluation of the stomach was somewhat limited due to incomplete
distention on this single column exam, however there is no evidence
of gastric mass or obstruction.  Prompt contrast opacification of
the duodenum and proximal small bowel is seen.  A percutaneous G J
tube is seen in place, with tip in the distal duodenum near the
ligament of Treitz.

Irregular contour the duodenal bulb is seen, however there is no
evidence of contrast leak or extravasation.  The duodenum and
proximal jejunum are nondilated.
IMPRESSION: Expected postoperative changes from repair of perforated duodenal
ulcer.  No evidence of anastomotic leak or other significant
abnormality.

## 2012-12-17 IMAGING — CR DG CHEST 2V
2 series · 2 of 2 positions shown · non-contrast
Comparison: Portable chest x-ray earlier same date 5926 hours and
dating back to 09/14/2011.

CLINICAL DATA: Shortness of breath.  Follow up CHF and effusions.

CHEST - 2 VIEW [DATE]/8112 1811 hours:

[w chest lat *]
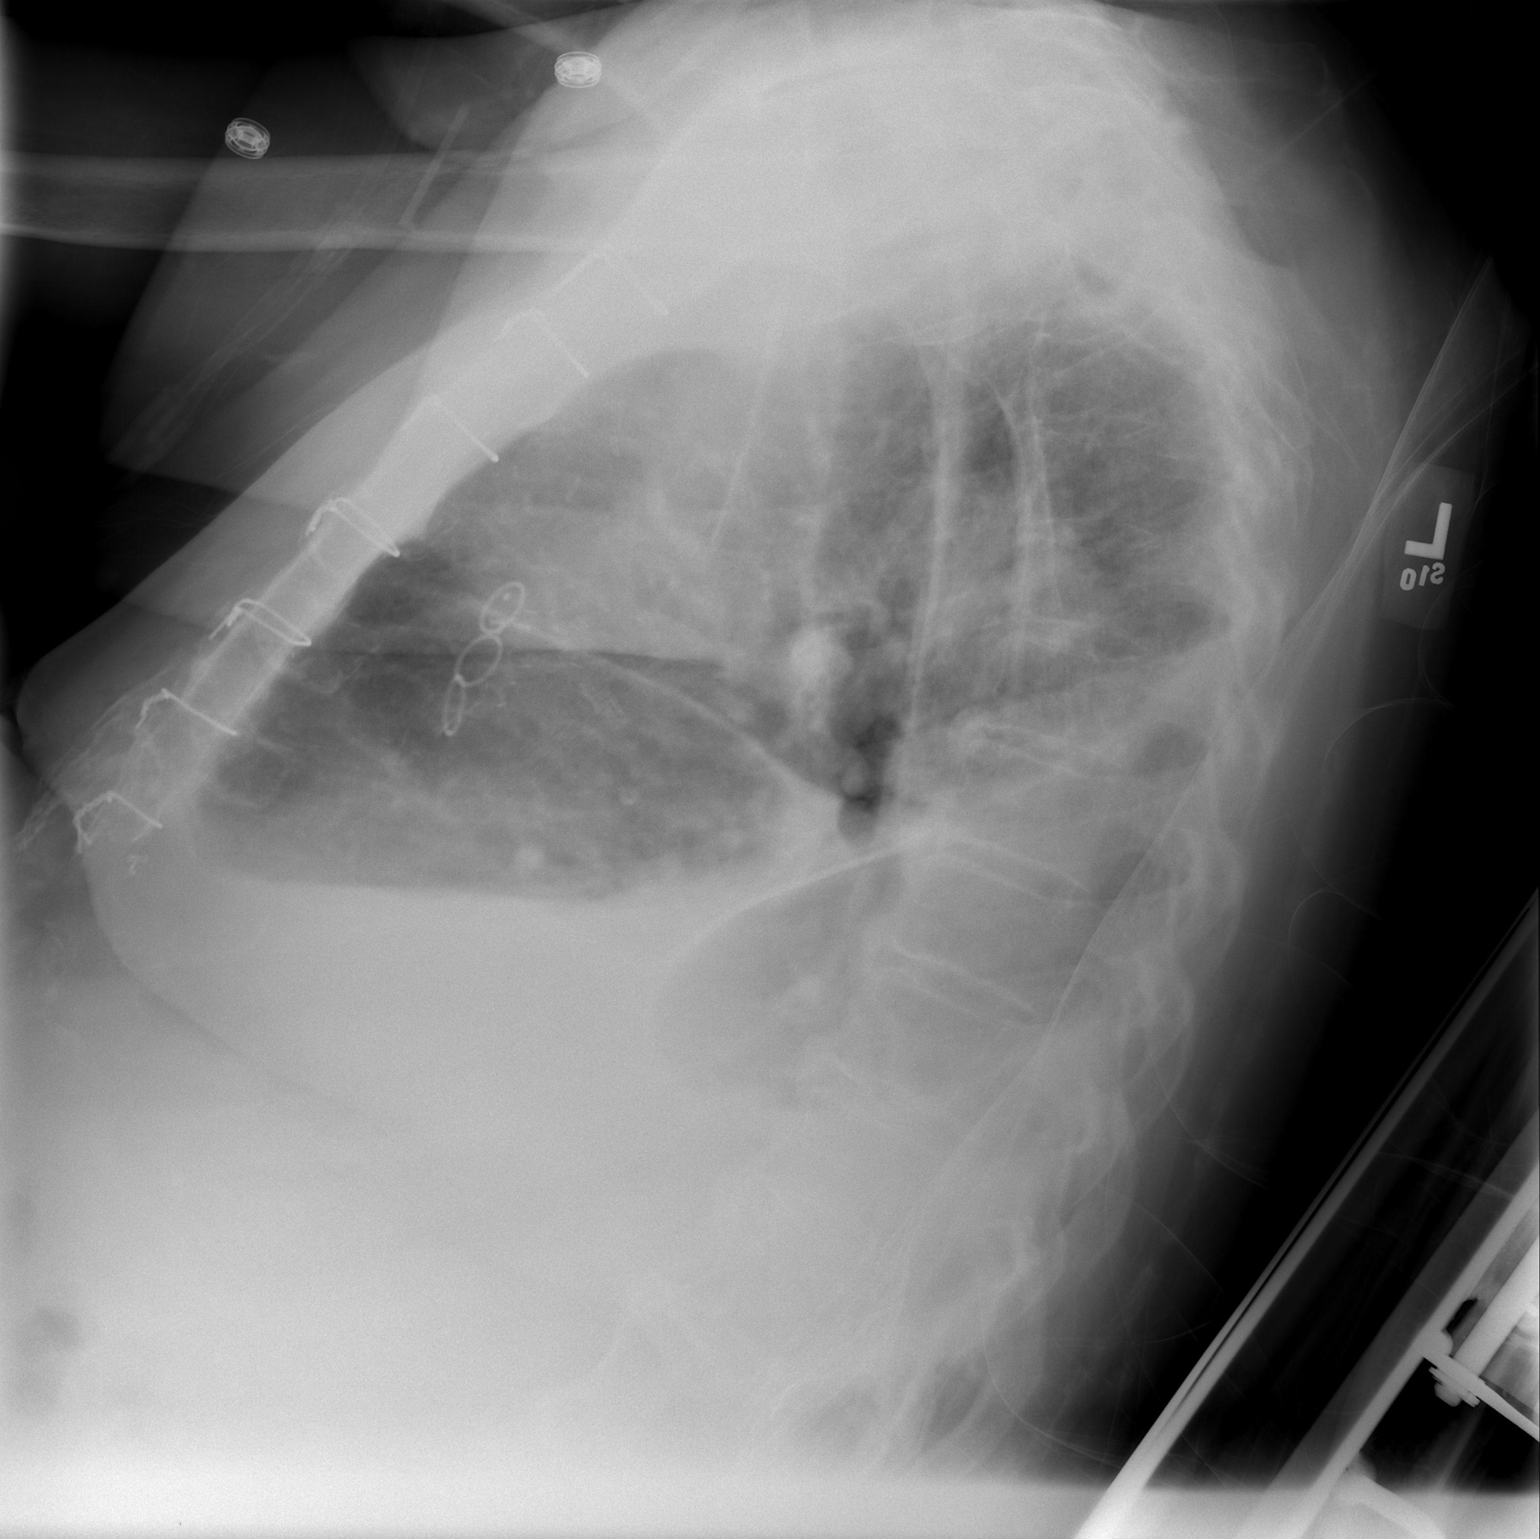

[view not recorded]
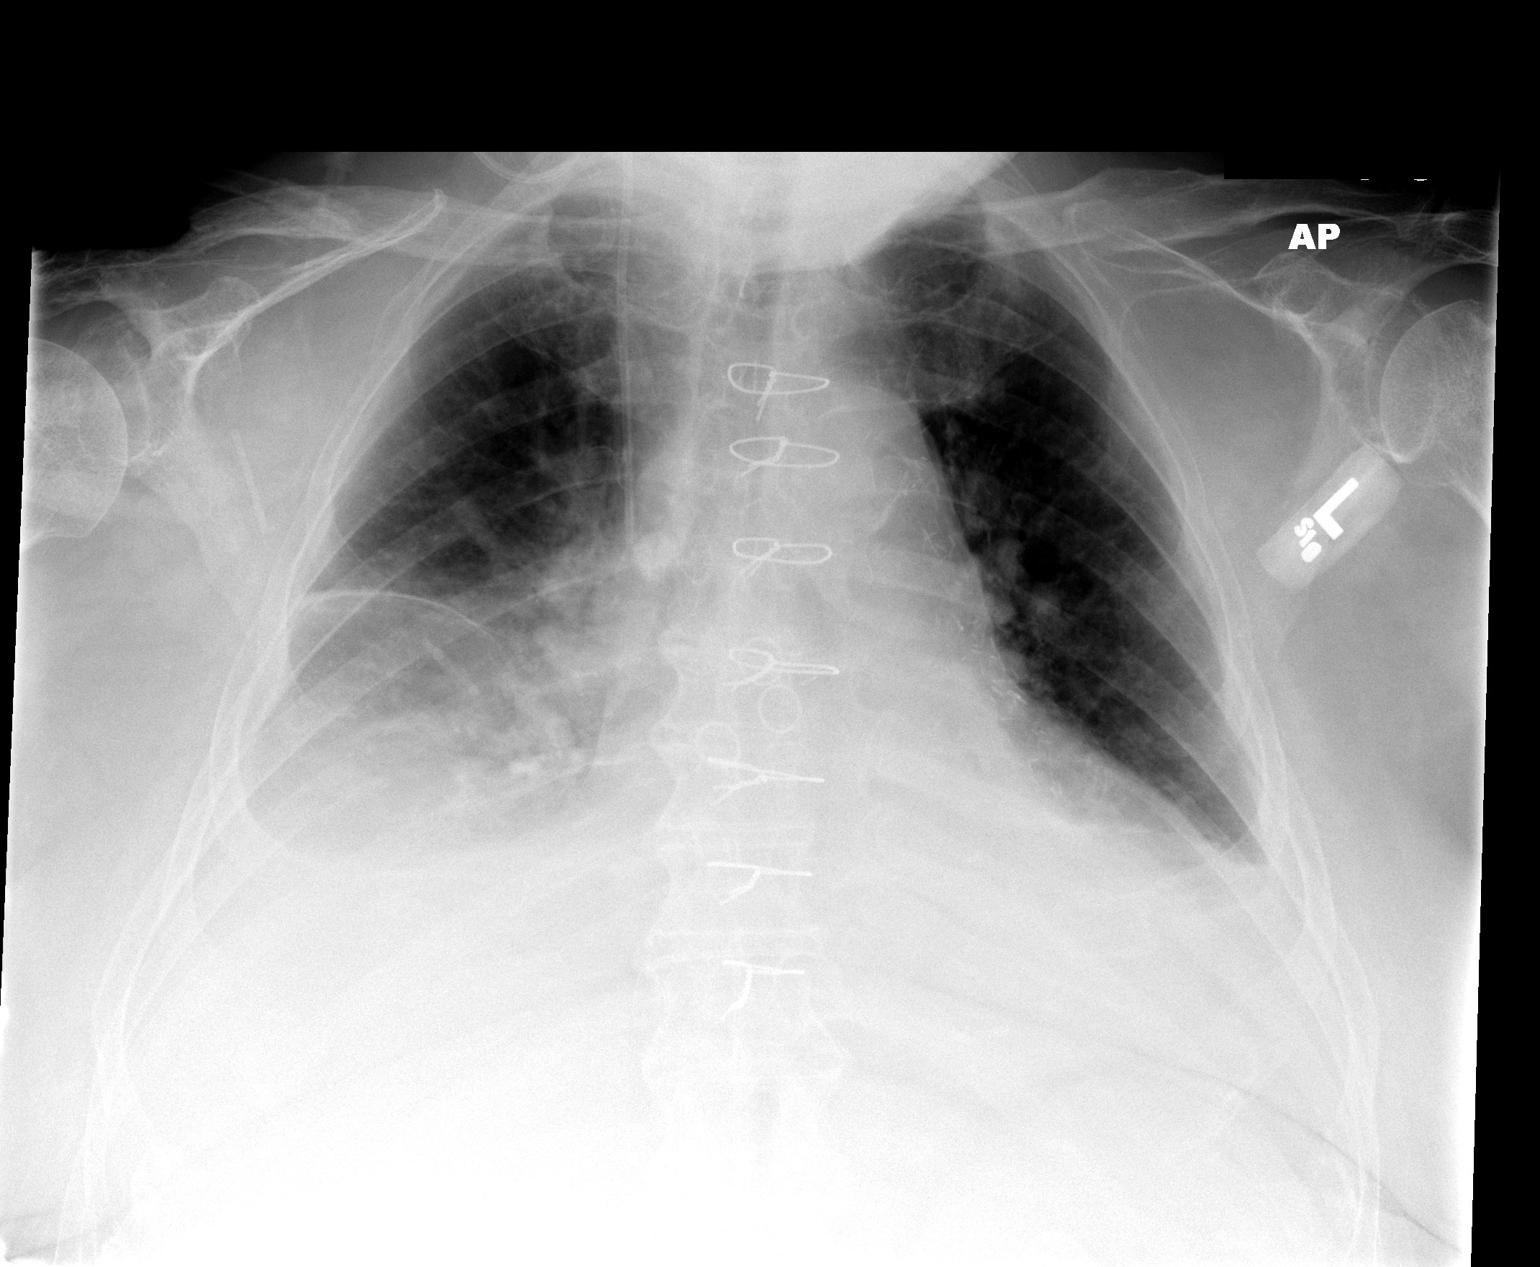

[2 of 2 positions shown; findings below may reference images not displayed]

FINDINGS: Prior sternotomy for CABG.  Cardiac silhouette enlarged
but stable.  Interval improvement in the pulmonary venous
hypertension and interstitial pulmonary edema, though the pulmonary
venous hypertension persists.  Stable large bilateral pleural
effusions, right greater than left, and associated dense
consolidation in the lower lobes.  Right jugular central venous
catheter tip remains in the SVC.  No new abnormalities.
IMPRESSION: Improved pulmonary venous hypertension and pulmonary edema since
earlier in the day.  Stable large bilateral pleural effusions,
right greater than left, and associated dense passive atelectasis
and/or pneumonia in the lower lobes.  No new abnormalities.

## 2012-12-17 IMAGING — CR DG CHEST 1V PORT
1 series · 1 of 1 positions shown · non-contrast
Comparison: Portable chest x-ray of 09/17/2011

CLINICAL DATA: Extubation, follow-up

PORTABLE CHEST - 1 VIEW

[AP]
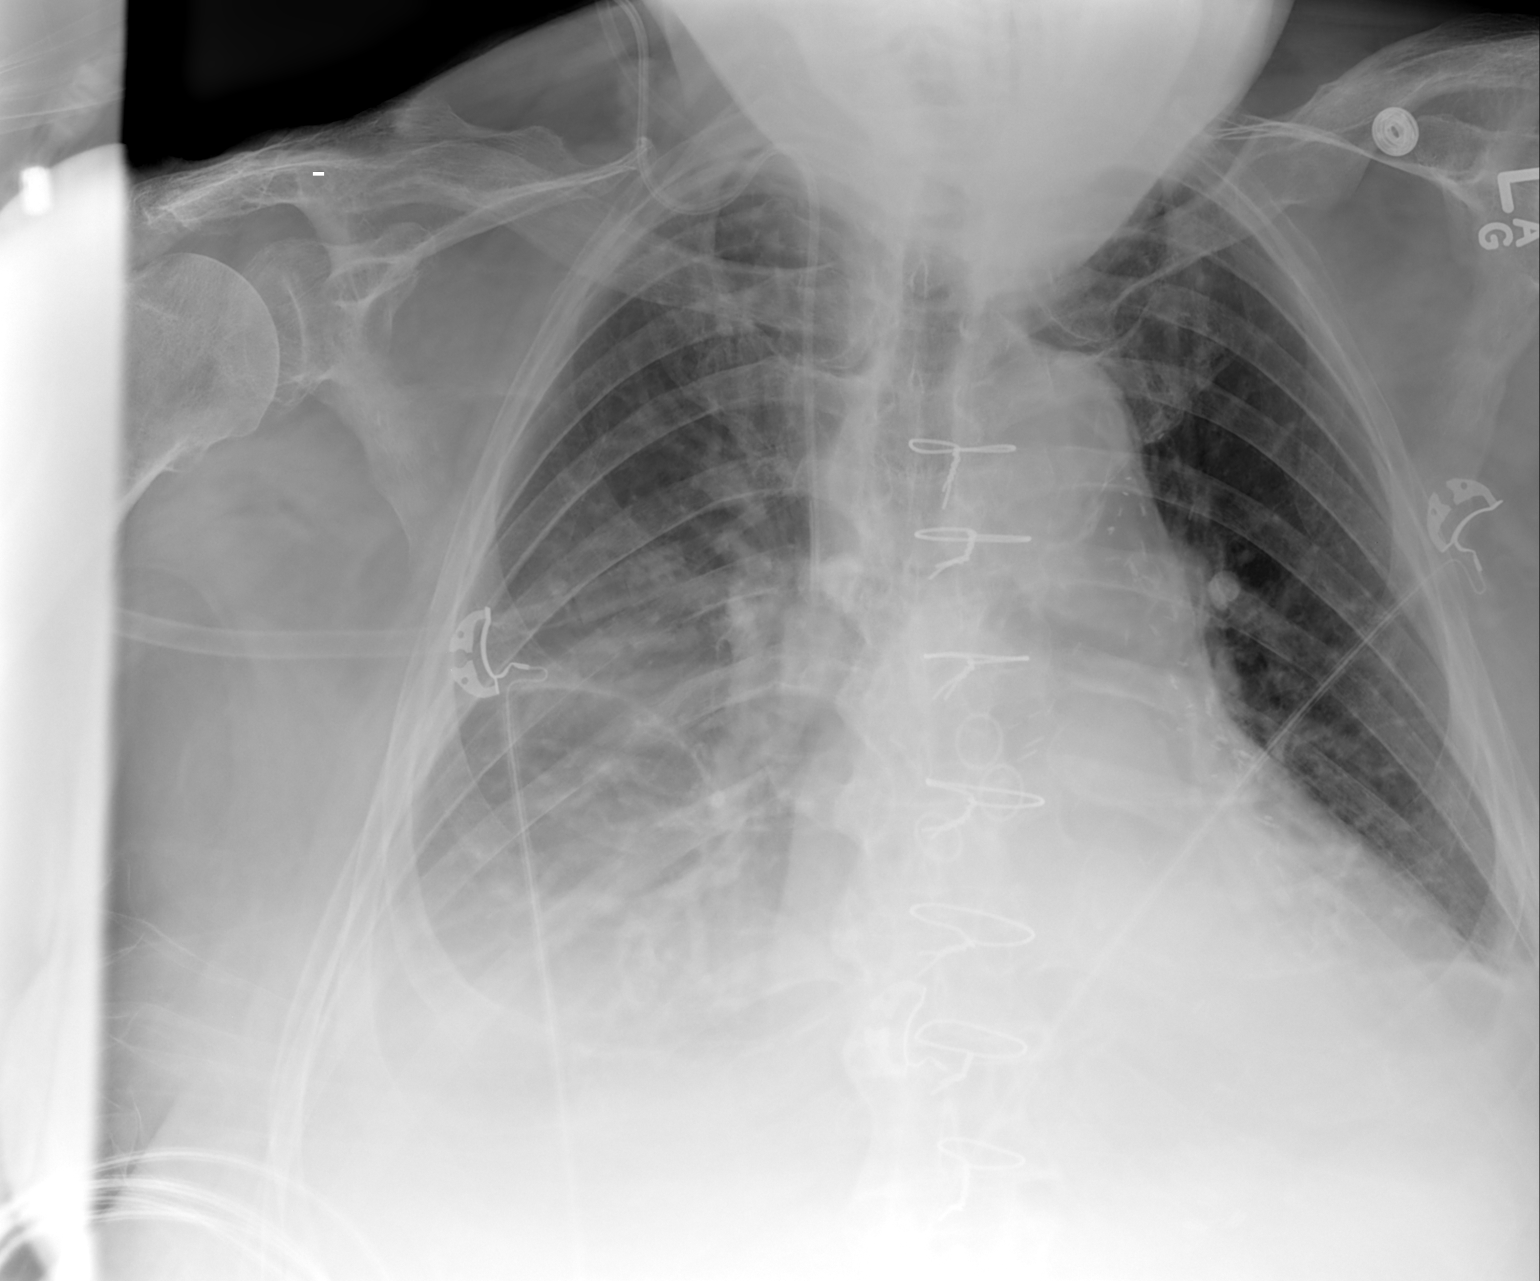

[1 of 1 positions shown; findings below may reference images not displayed]

FINDINGS: No endotracheal tube is seen.  The lungs appear slightly
better aerated.  Cardiomegaly, bilateral effusions and pulmonary
vascular congestion remain.  A right IJ central venous line
remains.  Median sternotomy sutures are noted.
IMPRESSION: Minimally improved aeration.  Little change in probable CHF with
cardiomegaly, pulmonary vascular congestion, and effusions.

## 2012-12-20 IMAGING — CR DG CHEST 2V
2 series · 2 of 2 positions shown · non-contrast
Comparison: 09/18/2011.

CLINICAL DATA: Short of breath.  Congestive heart failure.

CHEST - 2 VIEW

[w chest lat]
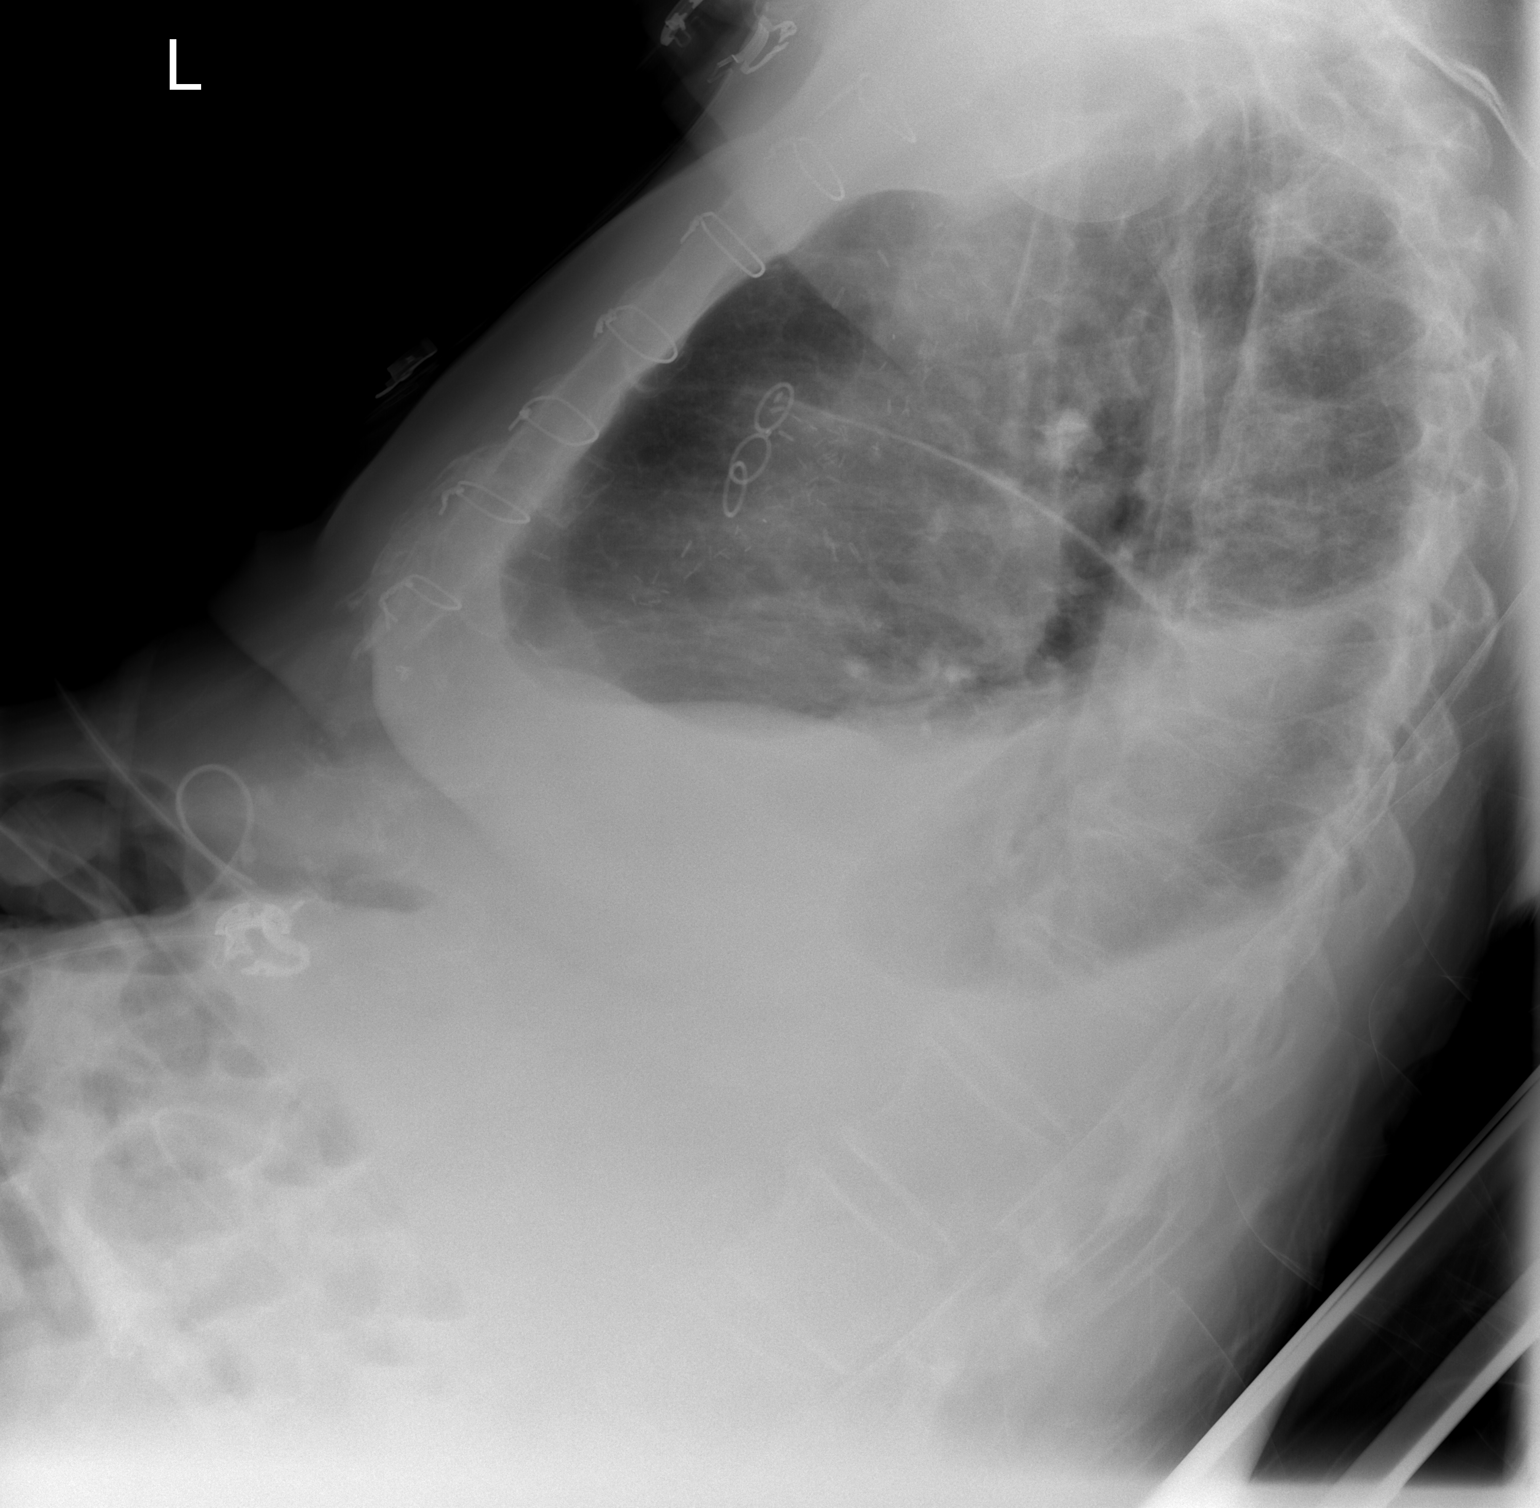

[view not recorded]
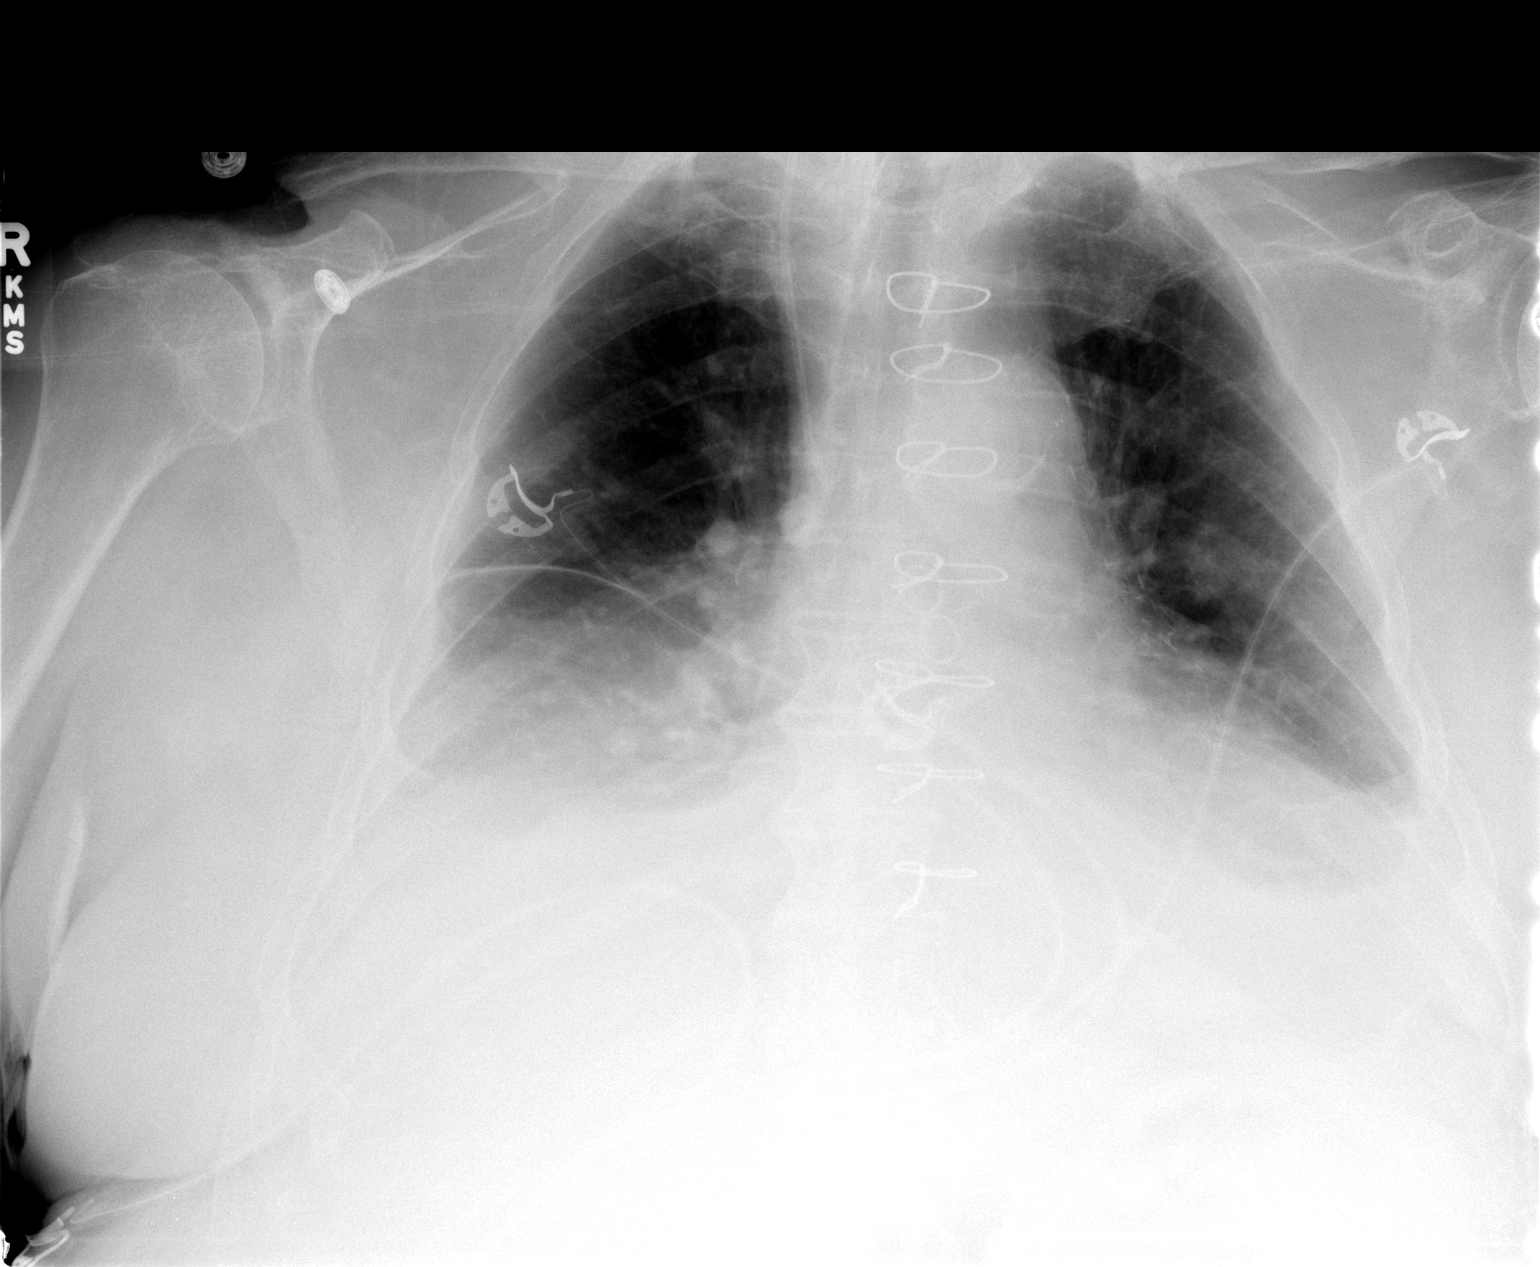

[2 of 2 positions shown; findings below may reference images not displayed]

FINDINGS: Right IJ central line unchanged with the tip in the mid
SVC.  Small to moderate bilateral pleural effusions are present.
CABG.  Cardiomegaly.  Basilar atelectasis.  Pulmonary vascular
congestion.  Mild interstitial pulmonary edema.
IMPRESSION: Mild CHF with cardiomegaly and pulmonary vascular congestion.
Small to moderate bilateral pleural effusions with associated
atelectasis.  Effusions appear little changed compared to prior.

## 2013-01-05 ENCOUNTER — Non-Acute Institutional Stay (SKILLED_NURSING_FACILITY): Payer: Medicare Other | Admitting: Nurse Practitioner

## 2013-01-05 DIAGNOSIS — F329 Major depressive disorder, single episode, unspecified: Secondary | ICD-10-CM

## 2013-01-05 DIAGNOSIS — I10 Essential (primary) hypertension: Secondary | ICD-10-CM

## 2013-01-05 DIAGNOSIS — D649 Anemia, unspecified: Secondary | ICD-10-CM

## 2013-01-05 DIAGNOSIS — D631 Anemia in chronic kidney disease: Secondary | ICD-10-CM | POA: Insufficient documentation

## 2013-01-05 DIAGNOSIS — K269 Duodenal ulcer, unspecified as acute or chronic, without hemorrhage or perforation: Secondary | ICD-10-CM

## 2013-01-05 DIAGNOSIS — E119 Type 2 diabetes mellitus without complications: Secondary | ICD-10-CM

## 2013-01-05 DIAGNOSIS — E039 Hypothyroidism, unspecified: Secondary | ICD-10-CM

## 2013-01-05 NOTE — Assessment & Plan Note (Signed)
Well compensated on Furosemide 20mg    

## 2013-01-05 NOTE — Assessment & Plan Note (Signed)
Stable on Protonix 40mg bid, no c/o stomach pain, indigestion, or bloody stools.                    

## 2013-01-05 NOTE — Assessment & Plan Note (Signed)
Corrected with Levothyroxine , TSH 1.763 10/23/12

## 2013-01-05 NOTE — Progress Notes (Signed)
Patient ID: [name] [name], male   DOB: June 17, 1920, 79 y.o.   MRN: [id number]  Chief Complaint:  Chief Complaint  Patient presents with  . Medical Managment of Chronic Issues     HPI:   Problem List Items Addressed This Visit   Diabetes mellitus (Chronic)     Hgb A1c 7.2 3/27/149(was 7.4 07/22/12). Takes Lantus 35units, Novolog 5 units with meals.         Duodenal ulcer     Stable on Protonix 40mg  bid, no c/o stomach pain, indigestion, or bloody stools.         Depression     Seems irritable since Cymbalta was decreased to 30mg  10/22/12 after fall. Still able to sleep and eat.         Essential hypertension, benign     Controlled on Losartan 50mg  daily        Unspecified hypothyroidism     Corrected with Levothyroxine , TSH 1.763 10/23/12      Anemia - Primary     Presently taking Fe, B12, Folic acid, last Hgb was 10.5 10/23/12       Review of Systems:  Review of Systems  Constitutional: Negative for fever, chills, weight loss, malaise/fatigue and diaphoresis.  HENT: Positive for hearing loss. Negative for ear pain, congestion, sore throat and neck pain.   Eyes: Negative for pain, discharge and redness.  Respiratory: Negative for cough, sputum production, shortness of breath and wheezing.   Cardiovascular: Positive for leg swelling (trace). Negative for chest pain, palpitations, orthopnea, claudication and PND.  Gastrointestinal: Negative for heartburn, nausea, vomiting, abdominal pain, diarrhea, constipation and blood in stool.  Genitourinary: Positive for frequency. Negative for dysuria, urgency, hematuria and flank pain.  Musculoskeletal: Positive for back pain, joint pain and falls. Negative for myalgias.  Skin: Negative for itching and rash.  Neurological: Negative for dizziness, tingling, tremors, sensory change, speech change, focal weakness, seizures, loss of consciousness, weakness and headaches.  Endo/Heme/Allergies: Negative for  environmental allergies and polydipsia. Does not bruise/bleed easily.  Psychiatric/Behavioral: Positive for memory loss. Negative for depression and hallucinations. The patient is not nervous/anxious and does not have insomnia.      Medications: Patient's Medications  New Prescriptions   No medications on file  Previous Medications   CALCIUM CARBONATE-VITAMIN D (CALCIUM 600+D) 600-400 MG-UNIT PER TABLET    Take 1 tablet by mouth daily.   CHOLECALCIFEROL (VITAMIN D) 1000 UNITS TABLET    Take 1,000 Units by mouth daily.   DULOXETINE (CYMBALTA) 30 MG CAPSULE    Take 30 mg by mouth daily.   FERROUS SULFATE 325 (65 FE) MG TABLET    Take 325 mg by mouth 2 (two) times daily.   FOLIC ACID (FOLVITE) 1 MG TABLET       FUROSEMIDE (LASIX) 20 MG TABLET    Take 20 mg by mouth 2 (two) times daily.   INSULIN ASPART (NOVOLOG) 100 UNIT/ML INJECTION    Inject 5 Units into the skin 3 (three) times daily before meals. For CBG>150   INSULIN GLARGINE (LANTUS) 100 UNIT/ML INJECTION    Inject 35 Units into the skin at bedtime.    LEVOTHYROXINE (SYNTHROID, LEVOTHROID) 75 MCG TABLET    Take 75 mcg by mouth daily.   LOSARTAN (COZAAR) 50 MG TABLET    Take 50 mg by mouth daily.   MEMANTINE (NAMENDA) 10 MG TABLET    Take 10 mg by mouth 2 (two) times daily.   OMEGA-3 FATTY ACIDS (FISH OIL) 1200 MG  CAPS    Take 1 capsule by mouth daily.   PANTOPRAZOLE (PROTONIX) 40 MG TABLET    Take 40 mg by mouth 2 (two) times daily.   VITAMIN B-12 (CYANOCOBALAMIN) 1000 MCG TABLET    Take 1,000 mcg by mouth daily.  Modified Medications   No medications on file  Discontinued Medications   No medications on file     Physical Exam: Physical Exam  Constitutional: He is oriented to person, place, and time. He appears well-developed and well-nourished. No distress.  HENT:  Head: Normocephalic and atraumatic.  Right Ear: External ear normal.  Left Ear: External ear normal.  Nose: Nose normal.  Mouth/Throat: Oropharynx is clear and  moist. No oropharyngeal exudate.  Eyes: Conjunctivae and EOM are normal. Pupils are equal, round, and reactive to light.  Neck: Normal range of motion. Neck supple. No JVD present. No thyromegaly present.  Cardiovascular: Normal rate, regular rhythm and normal heart sounds.   No murmur heard. Pulmonary/Chest: Effort normal. No respiratory distress. He has no wheezes. He has rales in the right lower field and the left lower field. He exhibits no tenderness.  Abdominal: Soft. Bowel sounds are normal. He exhibits no distension and no mass. There is no tenderness. There is no rebound.  Musculoskeletal: Normal range of motion. He exhibits edema (trace in BLE).  Lymphadenopathy:    He has no cervical adenopathy.  Neurological: He is alert and oriented to person, place, and time. He has normal reflexes. He displays normal reflexes. No cranial nerve deficit. Coordination normal.  Skin: Skin is warm and dry. No rash noted. He is not diaphoretic. No erythema.  Psychiatric: He has a normal mood and affect. His behavior is normal. Judgment and thought content normal.     Filed Vitals:   01/05/13 1426  BP: 136/70  Pulse: 70  Temp: 98 F (36.7 C)  TempSrc: Tympanic  Resp: 20      Labs reviewed: Basic Metabolic Panel:  Recent Labs  19/14/78  NA 133*  K 4.6  BUN 36*  CREATININE 1.5*  TSH 1.76    Liver Function Tests:  Recent Labs  10/23/12  AST 12*  ALT 8*  ALKPHOS 89    CBC:  Recent Labs  10/23/12  WBC 6.0  HGB 10.0*  HCT 29*  PLT 85*    Anemia Panel: No results found for this basename: IRON, FOLATE, VITAMINB12,  in the last 8760 hours  Significant Diagnostic Results:     Assessment/Plan Diabetes mellitus Hgb A1c 7.2 3/27/149(was 7.4 07/22/12). Takes Lantus 35units, Novolog 5 units with meals.       Unspecified hypothyroidism Corrected with Levothyroxine , TSH 1.763 10/23/12    Depression Seems irritable since Cymbalta was decreased to 30mg   10/22/12 after fall. Still able to sleep and eat.       CHF (congestive heart failure) Well compensated on Furosemide 20mg        Essential hypertension, benign Controlled on Losartan 50mg  daily      Dementia Stable on Namenda 28mg       Duodenal ulcer Stable on Protonix 40mg  bid, no c/o stomach pain, indigestion, or bloody stools.       Anemia Presently taking Fe, B12, Folic acid, last Hgb was 10.5 10/23/12      Family/ staff Communication: observe the patient.    Goals of care: SNF   Labs/tests ordered none.

## 2013-01-05 NOTE — Assessment & Plan Note (Signed)
Hgb A1c 7.2 3/27/149(was 7.4 07/22/12). Takes Lantus 35units, Novolog 5 units with meals.

## 2013-01-05 NOTE — Assessment & Plan Note (Signed)
Seems irritable since Cymbalta was decreased to 30mg 10/22/12 after fall. Still able to sleep and eat.            

## 2013-01-05 NOTE — Assessment & Plan Note (Signed)
Controlled on Losartan 50 mg daily.

## 2013-01-05 NOTE — Assessment & Plan Note (Signed)
Stable on Namenda 28mg       

## 2013-01-05 NOTE — Assessment & Plan Note (Signed)
Presently taking Fe, B12, Folic acid, last Hgb was 10.5 10/23/12

## 2013-01-08 ENCOUNTER — Encounter: Payer: Self-pay | Admitting: *Deleted

## 2013-02-04 ENCOUNTER — Non-Acute Institutional Stay (SKILLED_NURSING_FACILITY): Payer: Medicare Other | Admitting: Nurse Practitioner

## 2013-02-04 ENCOUNTER — Encounter: Payer: Self-pay | Admitting: Nurse Practitioner

## 2013-02-04 DIAGNOSIS — F0391 Unspecified dementia with behavioral disturbance: Secondary | ICD-10-CM

## 2013-02-04 DIAGNOSIS — D649 Anemia, unspecified: Secondary | ICD-10-CM

## 2013-02-04 DIAGNOSIS — E119 Type 2 diabetes mellitus without complications: Secondary | ICD-10-CM

## 2013-02-04 DIAGNOSIS — I10 Essential (primary) hypertension: Secondary | ICD-10-CM

## 2013-02-04 DIAGNOSIS — N183 Chronic kidney disease, stage 3 (moderate): Secondary | ICD-10-CM

## 2013-02-04 DIAGNOSIS — E039 Hypothyroidism, unspecified: Secondary | ICD-10-CM

## 2013-02-04 DIAGNOSIS — K269 Duodenal ulcer, unspecified as acute or chronic, without hemorrhage or perforation: Secondary | ICD-10-CM

## 2013-02-04 DIAGNOSIS — F329 Major depressive disorder, single episode, unspecified: Secondary | ICD-10-CM

## 2013-02-04 NOTE — Assessment & Plan Note (Signed)
Hgb A1c 7.2 3/27/149(was 7.4 07/22/12). Takes Lantus 35units, Novolog 5 units with meals. Update Hgb a1c

## 2013-02-04 NOTE — Assessment & Plan Note (Signed)
Controlled on Losartan 50 mg daily.

## 2013-02-04 NOTE — Progress Notes (Signed)
Patient ID: [name] [name], male   DOB: 05/20/1920, 79 y.o.   MRN: [id number] Code Status: Full Code  Allergies  Allergen Reactions  . Enalapril Other (See Comments)    cough  . Milk-Related Compounds Other (See Comments)    Doesn't remember but its a mild reaction    Chief Complaint  Patient presents with  . Medical Managment of Chronic Issues    HPI: Patient is a 79 y.o. male seen in the SNF at [facility] today for evaluation of his chronic medical conditions.  Problem List Items Addressed This Visit   Anemia     Presently taking Fe, B12, Folic acid, last Hgb was 10.5 10/23/12-update CBC      CKD (chronic kidney disease) - Primary     Bun/creat 36/1.46 and Na 133 10/23/12 on Furosemide 20mg --update BMP. Only trace edema seen in BLE      Dementia     Stable on Namenda 28mg           Depression     Seems irritable since Cymbalta was decreased to 30mg  10/22/12 after fall. Still able to sleep and eat.           Diabetes mellitus (Chronic)     Hgb A1c 7.2 3/27/149(was 7.4 07/22/12). Takes Lantus 35units, Novolog 5 units with meals. Update Hgb a1c          Duodenal ulcer     Stable on Protonix 40mg  bid, no c/o stomach pain, indigestion, or bloody stools.           Essential hypertension, benign     Controlled on Losartan 50mg  daily          Unspecified hypothyroidism     Corrected with Levothyroxine , TSH 1.763 10/23/12           Review of Systems:  Review of Systems  Constitutional: Negative for fever, chills, weight loss, malaise/fatigue and diaphoresis.  HENT: Positive for hearing loss. Negative for ear pain, congestion, sore throat and neck pain.   Eyes: Negative for pain, discharge and redness.  Respiratory: Negative for cough, sputum production, shortness of breath and wheezing.   Cardiovascular: Positive for leg swelling (trace). Negative for chest pain, palpitations, orthopnea, claudication and PND.   Gastrointestinal: Negative for heartburn, nausea, vomiting, abdominal pain, diarrhea, constipation and blood in stool.  Genitourinary: Positive for frequency. Negative for dysuria, urgency, hematuria and flank pain.  Musculoskeletal: Positive for back pain, joint pain and falls. Negative for myalgias.  Skin: Negative for itching and rash.  Neurological: Negative for dizziness, tingling, tremors, sensory change, speech change, focal weakness, seizures, loss of consciousness, weakness and headaches.  Endo/Heme/Allergies: Negative for environmental allergies and polydipsia. Does not bruise/bleed easily.  Psychiatric/Behavioral: Positive for memory loss. Negative for depression and hallucinations. The patient is not nervous/anxious and does not have insomnia.      Past Medical History  Diagnosis Date  . Hypothyroidism   . Hypertension   . CHF (congestive heart failure)   . Diabetes mellitus   . Coronary artery disease   . Restless leg syndrome   . Hyperlipemia   . Renal disease     stage II  . Sleep apnea   . Cellulitis and abscess of leg   . Obesity   . Duodenal ulcer, perforated   . Diverticulosis   . Internal hemorrhoids   . CAD (coronary artery disease)   . Anemia   . Duodenal ulcer   . Allergy   . Blood transfusion   .  Cataract   . Tuberculosis     as a child  . Contact dermatitis and other eczema, due to unspecified cause 10/06/2012  . Senile dementia with depressive features 05/26/2012  . Hyposmolality and/or hyponatremia 04/02/2012  . Diarrhea 03/26/2012  . Hemorrhage of rectum and anus 03/25/2012  . Pneumonia, organism unspecified 03/12/2012  . 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 Folate-deficiency anemia 01/07/2012  . Thrombocytopenia, unspecified 09/26/2011  . Loss of weight 09/11/2011  . Dysphagia, unspecified(787.20) 06/28/2011  . Chronic kidney disease, stage III (moderate) 03/22/2011  . Cramp of limb 08/10/2010  . Chronic kidney  disease, stage II (mild) 03/22/2011  . Personal history of fall 05/05/2009  . Unspecified vitamin D deficiency 02/24/2009  . Blood in stool 11/18/2008  . Dermatophytosis of the body 06/23/2007  . Obesity, unspecified 06/23/2007  . Unspecified tinnitus 06/23/2007  . Edema 06/23/2007  . Shortness of breath 06/23/2007  . Flatulence, eructation, and gas pain 06/23/2007  . Other general symptoms(780.99) 06/23/2007  . Memory loss 08/13/2003   Past Surgical History  Procedure Laterality Date  . Laparotomy  09/12/2011    Procedure: EXPLORATORY LAPAROTOMY;  Surgeon: Rulon Abide, DO;  Location: [facility];  Service: General;  Laterality: N/A;  REPAIR PERFORATED ULCER  . Gastrostomy  09/12/2011    Procedure: GASTROSTOMY;  Surgeon: Rulon Abide, DO;  Location: [facility];  Service: General;  Laterality: N/A;  BIOPSY DUODENAL ULCER  . Shoulder surgery  [place], [place]  . Jejunostomy feeding tube      removed  . Back surgery  1942  . Eye surgery  [phone]    cataracts  . Coronary artery bypass graft  2201   Social History:   reports that he has quit smoking. His smoking use included Cigarettes. He smoked 0.00 packs per day. He has never used smokeless tobacco. He reports that he does not drink alcohol or use illicit drugs.  Family History  Problem Relation Age of Onset  . Stroke Father     Medications: Reviewed at [facility]   Physical Exam: Physical Exam  Constitutional: He is oriented to person, place, and time. He appears well-developed and well-nourished. No distress.  HENT:  Head: Normocephalic and atraumatic.  Right Ear: External ear normal.  Left Ear: External ear normal.  Nose: Nose normal.  Mouth/Throat: Oropharynx is clear and moist. No oropharyngeal exudate.  Eyes: Conjunctivae and EOM are normal. Pupils are equal, round, and reactive to light.  Neck: Normal range of motion. Neck supple. No JVD present. No thyromegaly present.  Cardiovascular: Normal rate,  regular rhythm and normal heart sounds.   No murmur heard. Pulmonary/Chest: Effort normal. No respiratory distress. He has no wheezes. He has rales in the right lower field and the left lower field. He exhibits no tenderness.  Abdominal: Soft. Bowel sounds are normal. He exhibits no distension and no mass. There is no tenderness. There is no rebound.  Musculoskeletal: Normal range of motion. He exhibits edema (trace in BLE).  Lymphadenopathy:    He has no cervical adenopathy.  Neurological: He is alert and oriented to person, place, and time. He has normal reflexes. He displays normal reflexes. No cranial nerve deficit. Coordination normal.  Skin: Skin is warm and dry. No rash noted. He is not diaphoretic. No erythema.  Psychiatric: He has a normal mood and affect. His behavior is normal. Judgment and thought content normal.    Filed Vitals:   02/04/13 1642  BP: 138/72  Pulse: 88  Temp: 98.5 F (36.9 C)  TempSrc: Tympanic  Resp: 20      Labs reviewed: Basic Metabolic Panel:  Recent Labs  40/98/11  NA 133*  K 4.6  BUN 36*  CREATININE 1.5*  TSH 1.76   Liver Function Tests:  Recent Labs  10/23/12  AST 12*  ALT 8*  ALKPHOS 89    CBC:  Recent Labs  10/23/12  WBC 6.0  HGB 10.0*  HCT 29*  PLT 85*       Assessment/Plan Anemia Presently taking Fe, B12, Folic acid, last Hgb was 10.5 10/23/12-update CBC    CKD (chronic kidney disease) Bun/creat 36/1.46 and Na 133 10/23/12 on Furosemide 20mg --update BMP. Only trace edema seen in BLE    Diabetes mellitus Hgb A1c 7.2 3/27/149(was 7.4 07/22/12). Takes Lantus 35units, Novolog 5 units with meals. Update Hgb a1c        Unspecified hypothyroidism Corrected with Levothyroxine , TSH 1.763 10/23/12      Depression Seems irritable since Cymbalta was decreased to 30mg  10/22/12 after fall. Still able to sleep and eat.         Essential hypertension, benign Controlled on Losartan 50mg   daily        Dementia Stable on Namenda 28mg         Duodenal ulcer Stable on Protonix 40mg  bid, no c/o stomach pain, indigestion, or bloody stools.           Family/ Staff Communication: observe the patient.   Goals of Care: SNF  Labs/tests ordered: CBC, BMP, Hgb a1c.

## 2013-02-04 NOTE — Assessment & Plan Note (Signed)
Stable on Protonix 40mg bid, no c/o stomach pain, indigestion, or bloody stools.                    

## 2013-02-04 NOTE — Assessment & Plan Note (Signed)
Presently taking Fe, B12, Folic acid, last Hgb was 10.5 10/23/12-update CBC

## 2013-02-04 NOTE — Assessment & Plan Note (Signed)
Stable on Namenda 28mg 

## 2013-02-04 NOTE — Assessment & Plan Note (Signed)
Seems irritable since Cymbalta was decreased to 30mg 10/22/12 after fall. Still able to sleep and eat.            

## 2013-02-04 NOTE — Assessment & Plan Note (Signed)
Bun/creat 36/1.46 and Na 133 10/23/12 on Furosemide 20mg --update BMP. Only trace edema seen in BLE

## 2013-02-04 NOTE — Assessment & Plan Note (Signed)
Corrected with Levothyroxine 75mcg, TSH 1.763 10/23/12       

## 2013-02-05 LAB — BASIC METABOLIC PANEL
Creatinine: 1.6 mg/dL — AB (ref 0.6–1.3)
Potassium: 4.7 mmol/L (ref 3.4–5.3)
Sodium: 137 mmol/L (ref 137–147)

## 2013-02-05 LAB — CBC AND DIFFERENTIAL: Hemoglobin: 8.7 g/dL — AB (ref 13.5–17.5)

## 2013-02-05 LAB — HEMOGLOBIN A1C: Hgb A1c MFr Bld: 6.8 % — AB (ref 4.0–6.0)

## 2013-02-11 ENCOUNTER — Non-Acute Institutional Stay (SKILLED_NURSING_FACILITY): Payer: Medicare Other | Admitting: Nurse Practitioner

## 2013-02-11 ENCOUNTER — Encounter: Payer: Self-pay | Admitting: Nurse Practitioner

## 2013-02-11 DIAGNOSIS — I10 Essential (primary) hypertension: Secondary | ICD-10-CM

## 2013-02-11 DIAGNOSIS — E119 Type 2 diabetes mellitus without complications: Secondary | ICD-10-CM

## 2013-02-11 DIAGNOSIS — F329 Major depressive disorder, single episode, unspecified: Secondary | ICD-10-CM

## 2013-02-11 DIAGNOSIS — E039 Hypothyroidism, unspecified: Secondary | ICD-10-CM

## 2013-02-11 DIAGNOSIS — I5040 Unspecified combined systolic (congestive) and diastolic (congestive) heart failure: Secondary | ICD-10-CM

## 2013-02-11 DIAGNOSIS — D649 Anemia, unspecified: Secondary | ICD-10-CM

## 2013-02-11 NOTE — Assessment & Plan Note (Signed)
Corrected with Levothyroxine , TSH 1.763 10/23/12

## 2013-02-11 NOTE — Assessment & Plan Note (Signed)
Seems irritable since Cymbalta was decreased to 30mg 10/22/12 after fall. Still able to sleep and eat.            

## 2013-02-11 NOTE — Progress Notes (Signed)
Patient ID: [name] [name], male   DOB: 27-Dec-1919, 79 y.o.   MRN: [id number] Code Status: Living Will  Allergies  Allergen Reactions  . Enalapril Other (See Comments)    cough  . Milk-Related Compounds Other (See Comments)    Doesn't remember but its a mild reaction    Chief Complaint  Patient presents with  . Medical Managment of Chronic Issues    anemia.     HPI: Patient is a 79 y.o. male seen in the SNF at [facility] today for evaluation of anemia and other chronic medical conditions Problem List Items Addressed This Visit   Anemia - Primary     Presently taking Fe, B12, Folic acid, last Hgb was 10.5 10/23/12-dropped to 8.7 02/05/13--will obtain anemia panel to evaluate further        CHF (congestive heart failure)     Well compensated on Furosemide 20mg            Depression     Seems irritable since Cymbalta was decreased to 30mg  10/22/12 after fall. Still able to sleep and eat.             Diabetes mellitus (Chronic)     Hgb A1c 7.2 3/27/149(was 7.4 07/22/12)-6.8 02/05/13. Takes Lantus 35units, Novolog 5 units with meals.            Essential hypertension, benign     Controlled on Losartan 50mg  daily        Stable on Namenda 28mg             Unspecified hypothyroidism     Corrected with Levothyroxine , TSH 1.763 10/23/12             Review of Systems:  Review of Systems  Constitutional: Negative for fever, chills, weight loss, malaise/fatigue and diaphoresis.  HENT: Positive for hearing loss. Negative for ear pain, congestion, sore throat and neck pain.   Eyes: Negative for pain, discharge and redness.  Respiratory: Negative for cough, sputum production, shortness of breath and wheezing.   Cardiovascular: Positive for leg swelling (trace). Negative for chest pain, palpitations, orthopnea, claudication and PND.  Gastrointestinal: Negative for heartburn, nausea, vomiting, abdominal pain, diarrhea,  constipation and blood in stool.  Genitourinary: Positive for frequency. Negative for dysuria, urgency, hematuria and flank pain.  Musculoskeletal: Positive for back pain, joint pain and falls. Negative for myalgias.  Skin: Negative for itching and rash.  Neurological: Negative for dizziness, tingling, tremors, sensory change, speech change, focal weakness, seizures, loss of consciousness, weakness and headaches.  Endo/Heme/Allergies: Negative for environmental allergies and polydipsia. Does not bruise/bleed easily.  Psychiatric/Behavioral: Positive for memory loss. Negative for depression and hallucinations. The patient is not nervous/anxious and does not have insomnia.      Past Medical History  Diagnosis Date  . Hypothyroidism   . Hypertension   . CHF (congestive heart failure)   . Diabetes mellitus   . Coronary artery disease   . Restless leg syndrome   . Hyperlipemia   . Renal disease     stage II  . Sleep apnea   . Cellulitis and abscess of leg   . Obesity   . Duodenal ulcer, perforated   . Diverticulosis   . Internal hemorrhoids   . CAD (coronary artery disease)   . Anemia   . Duodenal ulcer   . Allergy   . Blood transfusion   . Cataract   . Tuberculosis     as a child  . Contact dermatitis and other  eczema, due to unspecified cause 10/06/2012  . Senile dementia with depressive features 05/26/2012  . Hyposmolality and/or hyponatremia 04/02/2012  . Diarrhea 03/26/2012  . Hemorrhage of rectum and anus 03/25/2012  . Pneumonia, organism unspecified 03/12/2012  . 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 Folate-deficiency anemia 01/07/2012  . Thrombocytopenia, unspecified 09/26/2011  . Loss of weight 09/11/2011  . Dysphagia, unspecified(787.20) 06/28/2011  . Chronic kidney disease, stage III (moderate) 03/22/2011  . Cramp of limb 08/10/2010  . Chronic kidney disease, stage II (mild) 03/22/2011  . Personal history of fall 05/05/2009  .  Unspecified vitamin D deficiency 02/24/2009  . Blood in stool 11/18/2008  . Dermatophytosis of the body 06/23/2007  . Obesity, unspecified 06/23/2007  . Unspecified tinnitus 06/23/2007  . Edema 06/23/2007  . Shortness of breath 06/23/2007  . Flatulence, eructation, and gas pain 06/23/2007  . Other general symptoms(780.99) 06/23/2007  . Memory loss 08/13/2003   Past Surgical History  Procedure Laterality Date  . Laparotomy  09/12/2011    Procedure: EXPLORATORY LAPAROTOMY;  Surgeon: Rulon Abide, DO;  Location: [facility];  Service: General;  Laterality: N/A;  REPAIR PERFORATED ULCER  . Gastrostomy  09/12/2011    Procedure: GASTROSTOMY;  Surgeon: Rulon Abide, DO;  Location: [facility];  Service: General;  Laterality: N/A;  BIOPSY DUODENAL ULCER  . Shoulder surgery  [place], [place]  . Jejunostomy feeding tube      removed  . Back surgery  1942  . Eye surgery  [phone]    cataracts  . Coronary artery bypass graft  2201   Social History:   reports that he has quit smoking. His smoking use included Cigarettes. He smoked 0.00 packs per day. He has never used smokeless tobacco. He reports that he does not drink alcohol or use illicit drugs.  Family History  Problem Relation Age of Onset  . Stroke Father     Medications: Patient's Medications  New Prescriptions   No medications on file  Previous Medications   CALCIUM CARBONATE-VITAMIN D (CALCIUM 600+D) 600-400 MG-UNIT PER TABLET    Take 1 tablet by mouth daily.   CHOLECALCIFEROL (VITAMIN D) 1000 UNITS TABLET    Take 1,000 Units by mouth daily. Take 1 tablet daily for vitamin D supplement.   DULOXETINE (CYMBALTA) 30 MG CAPSULE    Take 30 mg by mouth daily.   FERROUS SULFATE 325 (65 FE) MG TABLET    Take 325 mg by mouth 2 (two) times daily.   FOLIC ACID (FOLVITE) 1 MG TABLET       FUROSEMIDE (LASIX) 20 MG TABLET    Take 20 mg by mouth 2 (two) times daily. Take 1/2 tablet for fluid retention.   INSULIN ASPART (NOVOLOG)  100 UNIT/ML INJECTION    Inject 5 Units into the skin 3 (three) times daily before meals. For CBG>150   INSULIN GLARGINE (LANTUS) 100 UNIT/ML INJECTION    Inject 34 Units into the skin at bedtime.    LEVOTHYROXINE (SYNTHROID, LEVOTHROID) 75 MCG TABLET    Take 75 mcg by mouth daily. Take 1 tablet daily  for thyroid supplement.   LOSARTAN (COZAAR) 50 MG TABLET    Take 50 mg by mouth daily. Take 1 tablet daily to control blood pressure.   MEMANTINE (NAMENDA) 10 MG TABLET    Take 10 mg by mouth 2 (two) times daily. Take 1 tablet twice daily for dementia/Alzheimer's disease.   OMEGA-3 FATTY ACIDS (FISH OIL) 1200 MG CAPS  Take 1 capsule by mouth daily.   PANTOPRAZOLE (PROTONIX) 40 MG TABLET    Take 40 mg by mouth 2 (two) times daily. Take 1 tablet twice daily as needed to reduce stomach acid.   VITAMIN B-12 (CYANOCOBALAMIN) 1000 MCG TABLET    Take 1,000 mcg by mouth daily.  Modified Medications   No medications on file  Discontinued Medications   No medications on file     Physical Exam: Physical Exam  Constitutional: He is oriented to person, place, and time. He appears well-developed and well-nourished. No distress.  HENT:  Head: Normocephalic and atraumatic.  Right Ear: External ear normal.  Left Ear: External ear normal.  Nose: Nose normal.  Mouth/Throat: Oropharynx is clear and moist. No oropharyngeal exudate.  Eyes: Conjunctivae and EOM are normal. Pupils are equal, round, and reactive to light.  Neck: Normal range of motion. Neck supple. No JVD present. No thyromegaly present.  Cardiovascular: Normal rate, regular rhythm and normal heart sounds.   No murmur heard. Pulmonary/Chest: Effort normal. No respiratory distress. He has no wheezes. He has rales in the right lower field and the left lower field. He exhibits no tenderness.  Abdominal: Soft. Bowel sounds are normal. He exhibits no distension and no mass. There is no tenderness. There is no rebound.  Musculoskeletal: Normal range  of motion. He exhibits edema (trace in BLE).  Lymphadenopathy:    He has no cervical adenopathy.  Neurological: He is alert and oriented to person, place, and time. He has normal reflexes. He displays normal reflexes. No cranial nerve deficit. Coordination normal.  Skin: Skin is warm and dry. No rash noted. He is not diaphoretic. No erythema.  Psychiatric: He has a normal mood and affect. His behavior is normal. Judgment and thought content normal.    Filed Vitals:   02/11/13 1638  BP: 138/72  Pulse: 88  Temp: 98.5 F (36.9 C)  TempSrc: Tympanic  Resp: 20      Labs reviewed: Basic Metabolic Panel:  Recent Labs  16/10/96 02/05/13  NA 133* 137  K 4.6 4.7  BUN 36* 41*  CREATININE 1.5* 1.6*  TSH 1.76  --    Liver Function Tests:  Recent Labs  10/23/12  AST 12*  ALT 8*  ALKPHOS 89    CBC:  Recent Labs  10/23/12 02/05/13  WBC 6.0 4.6  HGB 10.0* 8.7*  HCT 29* 25*  PLT 85* 88*       Assessment/Plan Anemia Presently taking Fe, B12, Folic acid, last Hgb was 10.5 10/23/12-dropped to 8.7 02/05/13--will obtain anemia panel to evaluate further      Diabetes mellitus Hgb A1c 7.2 3/27/149(was 7.4 07/22/12)-6.8 02/05/13. Takes Lantus 35units, Novolog 5 units with meals.          Unspecified hypothyroidism Corrected with Levothyroxine , TSH 1.763 10/23/12        Depression Seems irritable since Cymbalta was decreased to 30mg  10/22/12 after fall. Still able to sleep and eat.           CHF (congestive heart failure) Well compensated on Furosemide 20mg          Essential hypertension, benign Controlled on Losartan 50mg  daily        Stable on Namenda 28mg             Family/ Staff Communication: observe the patient.  Goals of Care: SNF  Labs/tests ordered: anemia panel.

## 2013-02-11 NOTE — Assessment & Plan Note (Signed)
Hgb A1c 7.2 3/27/149(was 7.4 07/22/12)-6.8 02/05/13. Takes Lantus 35units, Novolog 5 units with meals.                           

## 2013-02-11 NOTE — Assessment & Plan Note (Signed)
Well compensated on Furosemide 20mg 

## 2013-02-11 NOTE — Assessment & Plan Note (Signed)
Presently taking Fe, B12, Folic acid, last Hgb was 10.5 10/23/12-dropped to 8.7 02/05/13--will obtain anemia panel to evaluate further

## 2013-02-11 NOTE — Assessment & Plan Note (Signed)
Controlled on Losartan 50mg  daily        Stable on Namenda 28mg 

## 2013-02-17 LAB — CBC AND DIFFERENTIAL: Hemoglobin: 3.9 g/dL — AB (ref 13.5–17.5)

## 2013-03-18 ENCOUNTER — Non-Acute Institutional Stay (SKILLED_NURSING_FACILITY): Payer: Medicare Other | Admitting: Nurse Practitioner

## 2013-03-18 ENCOUNTER — Encounter: Payer: Self-pay | Admitting: Nurse Practitioner

## 2013-03-18 DIAGNOSIS — I509 Heart failure, unspecified: Secondary | ICD-10-CM

## 2013-03-18 DIAGNOSIS — D649 Anemia, unspecified: Secondary | ICD-10-CM

## 2013-03-18 DIAGNOSIS — E039 Hypothyroidism, unspecified: Secondary | ICD-10-CM

## 2013-03-18 DIAGNOSIS — F329 Major depressive disorder, single episode, unspecified: Secondary | ICD-10-CM

## 2013-03-18 DIAGNOSIS — F0390 Unspecified dementia without behavioral disturbance: Secondary | ICD-10-CM

## 2013-03-18 DIAGNOSIS — I10 Essential (primary) hypertension: Secondary | ICD-10-CM

## 2013-03-18 DIAGNOSIS — K269 Duodenal ulcer, unspecified as acute or chronic, without hemorrhage or perforation: Secondary | ICD-10-CM

## 2013-03-18 DIAGNOSIS — F3289 Other specified depressive episodes: Secondary | ICD-10-CM

## 2013-03-18 NOTE — Assessment & Plan Note (Signed)
Controlled on Losartan 50 mg daily.

## 2013-03-18 NOTE — Assessment & Plan Note (Signed)
Stable on Namenda 28mg       

## 2013-03-18 NOTE — Assessment & Plan Note (Signed)
Well compensated on Furosemide 20mg, trace BLE edema-chronic.            

## 2013-03-18 NOTE — Assessment & Plan Note (Signed)
Presently taking Fe, B12, Folic acid, normal Iron 114, B12 920, Folate >20.0 02/17/13, Hgb 8-9 is his new baseline, decrease Iron to 325mg  daily, f/u CBC in 2weeks.

## 2013-03-18 NOTE — Assessment & Plan Note (Signed)
Stable on Protonix 40mg bid, no c/o stomach pain, indigestion, or bloody stools.                    

## 2013-03-18 NOTE — Assessment & Plan Note (Signed)
Seems irritable since Cymbalta was decreased to 30mg  10/22/12 after fall. Still able to sleep and eat.

## 2013-03-18 NOTE — Assessment & Plan Note (Signed)
Corrected with Levothyroxine 75mcg, TSH 1.763 10/23/12       

## 2013-03-18 NOTE — Progress Notes (Signed)
Patient ID: [name] [name], male   DOB: 27-May-1920, 79 y.o.   MRN: [id number] Code Status: Full Code.   Allergies  Allergen Reactions  . Enalapril Other (See Comments)    cough  . Milk-Related Compounds Other (See Comments)    Doesn't remember but its a mild reaction    Chief Complaint  Patient presents with  . Medical Managment of Chronic Issues    HPI: Patient is a 79 y.o. male seen in the SNF at [facility] today for evaluation of his chronic medical conditions.  Problem List Items Addressed This Visit   Anemia - Primary     Presently taking Fe, B12, Folic acid, normal Iron 114, B12 920, Folate >20.0 02/17/13, Hgb 8-9 is his new baseline, decrease Iron to 325mg  daily, f/u CBC in 2weeks.           CHF (congestive heart failure)     Well compensated on Furosemide 20mg , trace BLE edema-chronic.             Dementia     Stable on Namenda 28mg             Depression     Seems irritable since Cymbalta was decreased to 30mg  10/22/12 after fall. Still able to sleep and eat.               Duodenal ulcer     Stable on Protonix 40mg  bid, no c/o stomach pain, indigestion, or bloody stools.             Essential hypertension, benign     Controlled on Losartan 50mg  daily            Unspecified hypothyroidism     Corrected with Levothyroxine , TSH 1.763 10/23/12               Review of Systems:  Review of Systems  Constitutional: Negative for fever, chills, weight loss, malaise/fatigue and diaphoresis.  HENT: Positive for hearing loss. Negative for ear pain, congestion, sore throat and neck pain.   Eyes: Negative for pain, discharge and redness.  Respiratory: Negative for cough, sputum production, shortness of breath and wheezing.   Cardiovascular: Positive for leg swelling (trace). Negative for chest pain, palpitations, orthopnea, claudication and PND.  Gastrointestinal: Negative for heartburn, nausea, vomiting,  abdominal pain, diarrhea, constipation and blood in stool.  Genitourinary: Positive for frequency. Negative for dysuria, urgency, hematuria and flank pain.  Musculoskeletal: Positive for back pain, joint pain and falls. Negative for myalgias.  Skin: Negative for itching and rash.  Neurological: Negative for dizziness, tingling, tremors, sensory change, speech change, focal weakness, seizures, loss of consciousness, weakness and headaches.  Endo/Heme/Allergies: Negative for environmental allergies and polydipsia. Does not bruise/bleed easily.  Psychiatric/Behavioral: Positive for memory loss. Negative for depression and hallucinations. The patient is not nervous/anxious and does not have insomnia.      Past Medical History  Diagnosis Date  . Hypothyroidism   . Hypertension   . CHF (congestive heart failure)   . Diabetes mellitus   . Coronary artery disease   . Restless leg syndrome   . Hyperlipemia   . Renal disease     stage II  . Sleep apnea   . Cellulitis and abscess of leg   . Obesity   . Duodenal ulcer, perforated   . Diverticulosis   . Internal hemorrhoids   . CAD (coronary artery disease)   . Anemia   . Duodenal ulcer   . Allergy   .  Blood transfusion   . Cataract   . Tuberculosis     as a child  . Contact dermatitis and other eczema, due to unspecified cause 10/06/2012  . Senile dementia with depressive features 05/26/2012  . Hyposmolality and/or hyponatremia 04/02/2012  . Diarrhea 03/26/2012  . Hemorrhage of rectum and anus 03/25/2012  . Pneumonia, organism unspecified 03/12/2012  . 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 Folate-deficiency anemia 01/07/2012  . Thrombocytopenia, unspecified 09/26/2011  . Loss of weight 09/11/2011  . Dysphagia, unspecified(787.20) 06/28/2011  . Chronic kidney disease, stage III (moderate) 03/22/2011  . Cramp of limb 08/10/2010  . Chronic kidney disease, stage II (mild) 03/22/2011  . Personal history of  fall 05/05/2009  . Unspecified vitamin D deficiency 02/24/2009  . Blood in stool 11/18/2008  . Dermatophytosis of the body 06/23/2007  . Obesity, unspecified 06/23/2007  . Unspecified tinnitus 06/23/2007  . Edema 06/23/2007  . Shortness of breath 06/23/2007  . Flatulence, eructation, and gas pain 06/23/2007  . Other general symptoms(780.99) 06/23/2007  . Memory loss 08/13/2003   Past Surgical History  Procedure Laterality Date  . Laparotomy  09/12/2011    Procedure: EXPLORATORY LAPAROTOMY;  Surgeon: Rulon Abide, DO;  Location: [facility];  Service: General;  Laterality: N/A;  REPAIR PERFORATED ULCER  . Gastrostomy  09/12/2011    Procedure: GASTROSTOMY;  Surgeon: Rulon Abide, DO;  Location: [facility];  Service: General;  Laterality: N/A;  BIOPSY DUODENAL ULCER  . Shoulder surgery  [place], [place]  . Jejunostomy feeding tube      removed  . Back surgery  1942  . Eye surgery  [phone]    cataracts  . Coronary artery bypass graft  2201   Social History:   reports that he has quit smoking. His smoking use included Cigarettes. He smoked 0.00 packs per day. He has never used smokeless tobacco. He reports that he does not drink alcohol or use illicit drugs.  Family History  Problem Relation Age of Onset  . Stroke Father     Medications: Patient's Medications  New Prescriptions   No medications on file  Previous Medications   CALCIUM CARBONATE-VITAMIN D (CALCIUM 600+D) 600-400 MG-UNIT PER TABLET    Take 1 tablet by mouth daily.   CHOLECALCIFEROL (VITAMIN D) 1000 UNITS TABLET    Take 1,000 Units by mouth daily. Take 1 tablet daily for vitamin D supplement.   DULOXETINE (CYMBALTA) 30 MG CAPSULE    Take 30 mg by mouth daily.   FERROUS SULFATE 325 (65 FE) MG TABLET    Take 325 mg by mouth 2 (two) times daily.   FOLIC ACID (FOLVITE) 1 MG TABLET       FUROSEMIDE (LASIX) 20 MG TABLET    Take 20 mg by mouth 2 (two) times daily. Take 1/2 tablet for fluid retention.    INSULIN ASPART (NOVOLOG) 100 UNIT/ML INJECTION    Inject 5 Units into the skin 3 (three) times daily before meals. For CBG>150   INSULIN GLARGINE (LANTUS) 100 UNIT/ML INJECTION    Inject 34 Units into the skin at bedtime.    LEVOTHYROXINE (SYNTHROID, LEVOTHROID) 75 MCG TABLET    Take 75 mcg by mouth daily. Take 1 tablet daily  for thyroid supplement.   LOSARTAN (COZAAR) 50 MG TABLET    Take 50 mg by mouth daily. Take 1 tablet daily to control blood pressure.   MEMANTINE (NAMENDA) 10 MG TABLET    Take 10 mg by  mouth 2 (two) times daily. Take 1 tablet twice daily for dementia/Alzheimer's disease.   OMEGA-3 FATTY ACIDS (FISH OIL) 1200 MG CAPS    Take 1 capsule by mouth daily.   PANTOPRAZOLE (PROTONIX) 40 MG TABLET    Take 40 mg by mouth 2 (two) times daily. Take 1 tablet twice daily as needed to reduce stomach acid.   VITAMIN B-12 (CYANOCOBALAMIN) 1000 MCG TABLET    Take 1,000 mcg by mouth daily.  Modified Medications   No medications on file  Discontinued Medications   No medications on file     Physical Exam:Physical Exam  Constitutional: He is oriented to person, place, and time. He appears well-developed and well-nourished. No distress.  HENT:  Head: Normocephalic and atraumatic.  Right Ear: External ear normal.  Left Ear: External ear normal.  Nose: Nose normal.  Mouth/Throat: Oropharynx is clear and moist. No oropharyngeal exudate.  Eyes: Conjunctivae and EOM are normal. Pupils are equal, round, and reactive to light.  Neck: Normal range of motion. Neck supple. No JVD present. No thyromegaly present.  Cardiovascular: Normal rate, regular rhythm and normal heart sounds.   No murmur heard. Pulmonary/Chest: Effort normal. No respiratory distress. He has no wheezes. He has rales in the right lower field and the left lower field. He exhibits no tenderness.  Abdominal: Soft. Bowel sounds are normal. He exhibits no distension and no mass. There is no tenderness. There is no rebound.   Musculoskeletal: Normal range of motion. He exhibits edema (trace in BLE).  Lymphadenopathy:    He has no cervical adenopathy.  Neurological: He is alert and oriented to person, place, and time. He has normal reflexes. He displays normal reflexes. No cranial nerve deficit. Coordination normal.  Skin: Skin is warm and dry. No rash noted. He is not diaphoretic. No erythema.  Psychiatric: He has a normal mood and affect. His behavior is normal. Judgment and thought content normal.    Filed Vitals:   03/18/13 1308  BP: 113/69  Pulse: 82  Temp: 97.5 F (36.4 C)  TempSrc: Tympanic  Resp: 18      Labs reviewed: Basic Metabolic Panel:  Recent Labs  16/10/96 02/05/13  NA 133* 137  K 4.6 4.7  BUN 36* 41*  CREATININE 1.5* 1.6*  TSH 1.76  --    Liver Function Tests:  Recent Labs  10/23/12  AST 12*  ALT 8*  ALKPHOS 89   CBC:  Recent Labs  10/23/12 02/05/13 02/17/13  WBC 6.0 4.6 4.1  HGB 10.0* 8.7* 3.9*  HCT 29* 25* 27*  PLT 85* 88* 85*    Past Procedures: 03/12/12 CXR small infiltrate left lower lung with cardiomegaly.    Assessment/Plan Anemia Presently taking Fe, B12, Folic acid, normal Iron 114, B12 920, Folate >20.0 02/17/13, Hgb 8-9 is his new baseline, decrease Iron to 325mg  daily, f/u CBC in 2weeks.         Duodenal ulcer Stable on Protonix 40mg  bid, no c/o stomach pain, indigestion, or bloody stools.           Unspecified hypothyroidism Corrected with Levothyroxine , TSH 1.763 10/23/12          Depression Seems irritable since Cymbalta was decreased to 30mg  10/22/12 after fall. Still able to sleep and eat.             Dementia Stable on Namenda 28mg           Essential hypertension, benign Controlled on Losartan 50mg  daily  CHF (congestive heart failure) Well compensated on Furosemide 20mg , trace BLE edema-chronic.             Family/ Staff Communication: observe the  patient  Goals of Care: SNF  Labs/tests ordered: CBC in 2 weeks.

## 2013-04-02 LAB — CBC AND DIFFERENTIAL: WBC: 4.3 10^3/mL

## 2013-04-08 ENCOUNTER — Non-Acute Institutional Stay (SKILLED_NURSING_FACILITY): Payer: Medicare Other | Admitting: Nurse Practitioner

## 2013-04-08 ENCOUNTER — Encounter: Payer: Self-pay | Admitting: Nurse Practitioner

## 2013-04-08 DIAGNOSIS — E119 Type 2 diabetes mellitus without complications: Secondary | ICD-10-CM

## 2013-04-08 DIAGNOSIS — D649 Anemia, unspecified: Secondary | ICD-10-CM

## 2013-04-08 DIAGNOSIS — I10 Essential (primary) hypertension: Secondary | ICD-10-CM

## 2013-04-08 DIAGNOSIS — E039 Hypothyroidism, unspecified: Secondary | ICD-10-CM

## 2013-04-08 DIAGNOSIS — F329 Major depressive disorder, single episode, unspecified: Secondary | ICD-10-CM

## 2013-04-08 DIAGNOSIS — N189 Chronic kidney disease, unspecified: Secondary | ICD-10-CM

## 2013-04-08 DIAGNOSIS — R112 Nausea with vomiting, unspecified: Secondary | ICD-10-CM | POA: Insufficient documentation

## 2013-04-08 DIAGNOSIS — I509 Heart failure, unspecified: Secondary | ICD-10-CM

## 2013-04-08 DIAGNOSIS — F0390 Unspecified dementia without behavioral disturbance: Secondary | ICD-10-CM

## 2013-04-08 NOTE — Assessment & Plan Note (Signed)
Creat 1.5s, on Furosemide 20mg . Only trace edema seen in BLE

## 2013-04-08 NOTE — Assessment & Plan Note (Signed)
Hgb A1c 7.2 3/27/149(was 7.4 07/22/12)-6.8 02/05/13. Takes Lantus 35units, Novolog 5 units with meals.                           

## 2013-04-08 NOTE — Assessment & Plan Note (Signed)
Controlled on Losartan 50 mg daily.

## 2013-04-08 NOTE — Assessment & Plan Note (Signed)
Nausea and vomiting x1day, afebrile and able to keep some fluid down, denied abd pain, will have Phenergan prn available to him and encourage fluid as possible. CBC and BMP in am.

## 2013-04-08 NOTE — Assessment & Plan Note (Signed)
Well compensated on Furosemide 20mg , trace BLE edema-chronic.

## 2013-04-08 NOTE — Assessment & Plan Note (Signed)
Stable on Cymbalta 30mg  

## 2013-04-08 NOTE — Progress Notes (Signed)
Patient ID: [name] [name], male   DOB: Dec 25, 1919, 79 y.o.   MRN: [id number] Code Status: Living Will  Allergies  Allergen Reactions  . Enalapril Other (See Comments)    cough  . Milk-Related Compounds Other (See Comments)    Doesn't remember but its a mild reaction    Chief Complaint  Patient presents with  . Medical Managment of Chronic Issues    vomitng and diarrhea x1 day.     HPI: Patient is a 79 y.o. male seen in the SNF at [facility] today for evaluation of vomiting/diarrhea x1 day and other chronic medical conditions Problem List Items Addressed This Visit   Anemia     Presently taking Fe, B12, Folic acid, normal Iron 114, B12 920, Folate >20.0 02/17/13, Hgb 8-9 is his new baseline, decrease Iron to 325mg  daily            CHF (congestive heart failure)     Well compensated on Furosemide 20mg , trace BLE edema-chronic.               CKD (chronic kidney disease)     Creat 1.5s, on Furosemide 20mg . Only trace edema seen in BLE        Dementia     Stable on Namenda               Depression     Stable on Cymbalta 30mg                 Diabetes mellitus (Chronic)     Hgb A1c 7.2 3/27/149(was 7.4 07/22/12)-6.8 02/05/13. Takes Lantus 35units, Novolog 5 units with meals.              Essential hypertension, benign     Controlled on Losartan 50mg  daily              Nausea and vomiting in adult patient - Primary     Nausea and vomiting x1day, afebrile and able to keep some fluid down, denied abd pain, will have Phenergan prn available to him and encourage fluid as possible. CBC and BMP in am.     Unspecified hypothyroidism     Corrected with Levothyroxine , TSH 1.763 10/23/12                 Review of Systems:  Review of Systems  Constitutional: Negative for fever, chills, weight loss, malaise/fatigue and diaphoresis.  HENT: Positive for hearing loss. Negative for ear pain,  congestion, sore throat and neck pain.   Eyes: Negative for pain, discharge and redness.  Respiratory: Negative for cough, sputum production, shortness of breath and wheezing.   Cardiovascular: Positive for leg swelling (trace). Negative for chest pain, palpitations, orthopnea, claudication and PND.  Gastrointestinal: Positive for nausea, vomiting and diarrhea. Negative for heartburn, abdominal pain, constipation and blood in stool.  Genitourinary: Positive for frequency. Negative for dysuria, urgency, hematuria and flank pain.  Musculoskeletal: Positive for back pain, joint pain and falls. Negative for myalgias.  Skin: Negative for itching and rash.  Neurological: Negative for dizziness, tingling, tremors, sensory change, speech change, focal weakness, seizures, loss of consciousness, weakness and headaches.  Endo/Heme/Allergies: Negative for environmental allergies and polydipsia. Does not bruise/bleed easily.  Psychiatric/Behavioral: Positive for memory loss. Negative for depression and hallucinations. The patient is not nervous/anxious and does not have insomnia.      Past Medical History  Diagnosis Date  . Hypothyroidism   . Hypertension   . CHF (congestive heart failure)   .  Diabetes mellitus   . Coronary artery disease   . Restless leg syndrome   . Hyperlipemia   . Renal disease     stage II  . Sleep apnea   . Cellulitis and abscess of leg   . Obesity   . Duodenal ulcer, perforated   . Diverticulosis   . Internal hemorrhoids   . CAD (coronary artery disease)   . Anemia   . Duodenal ulcer   . Allergy   . Blood transfusion   . Cataract   . Tuberculosis     as a child  . Contact dermatitis and other eczema, due to unspecified cause 10/06/2012  . Senile dementia with depressive features 05/26/2012  . Hyposmolality and/or hyponatremia 04/02/2012  . Diarrhea 03/26/2012  . Hemorrhage of rectum and anus 03/25/2012  . Pneumonia, organism unspecified 03/12/2012  . Anemias  due to disorders of glutathione metabolism 01/07/2012  . Folate-deficiency anemia 01/07/2012  . Thrombocytopenia, unspecified 09/26/2011  . Loss of weight 09/11/2011  . Dysphagia, unspecified(787.20) 06/28/2011  . Chronic kidney disease, stage III (moderate) 03/22/2011  . Cramp of limb 08/10/2010  . Chronic kidney disease, stage II (mild) 03/22/2011  . Personal history of fall 05/05/2009  . Unspecified vitamin D deficiency 02/24/2009  . Blood in stool 11/18/2008  . Dermatophytosis of the body 06/23/2007  . Obesity, unspecified 06/23/2007  . Unspecified tinnitus 06/23/2007  . Edema 06/23/2007  . Shortness of breath 06/23/2007  . Flatulence, eructation, and gas pain 06/23/2007  . Other general symptoms(780.99) 06/23/2007  . Memory loss 08/13/2003   Past Surgical History  Procedure Laterality Date  . Laparotomy  09/12/2011    Procedure: EXPLORATORY LAPAROTOMY;  Surgeon: Rulon Abide, DO;  Location: [facility];  Service: General;  Laterality: N/A;  REPAIR PERFORATED ULCER  . Gastrostomy  09/12/2011    Procedure: GASTROSTOMY;  Surgeon: Rulon Abide, DO;  Location: [facility];  Service: General;  Laterality: N/A;  BIOPSY DUODENAL ULCER  . Shoulder surgery  [place], [place]  . Jejunostomy feeding tube      removed  . Back surgery  1942  . Eye surgery  [phone]    cataracts  . Coronary artery bypass graft  2201   Social History:   reports that he has quit smoking. His smoking use included Cigarettes. He smoked 0.00 packs per day. He has never used smokeless tobacco. He reports that he does not drink alcohol or use illicit drugs.  Family History  Problem Relation Age of Onset  . Stroke Father     Medications: Patient's Medications  New Prescriptions   No medications on file  Previous Medications   CALCIUM CARBONATE-VITAMIN D (CALCIUM 600+D) 600-400 MG-UNIT PER TABLET    Take 1 tablet by mouth daily.   CHOLECALCIFEROL (VITAMIN D) 1000 UNITS TABLET    Take 1,000 Units  by mouth daily. Take 1 tablet daily for vitamin D supplement.   DULOXETINE (CYMBALTA) 30 MG CAPSULE    Take 30 mg by mouth daily.   FERROUS SULFATE 325 (65 FE) MG TABLET    Take 325 mg by mouth 2 (two) times daily.   FOLIC ACID (FOLVITE) 1 MG TABLET       FUROSEMIDE (LASIX) 20 MG TABLET    Take 20 mg by mouth 2 (two) times daily. Take 1/2 tablet for fluid retention.   INSULIN ASPART (NOVOLOG) 100 UNIT/ML INJECTION    Inject 5 Units into the skin 3 (three) times daily before meals. For CBG>150  INSULIN GLARGINE (LANTUS) 100 UNIT/ML INJECTION    Inject 34 Units into the skin at bedtime.    LEVOTHYROXINE (SYNTHROID, LEVOTHROID) 75 MCG TABLET    Take 75 mcg by mouth daily. Take 1 tablet daily  for thyroid supplement.   LOSARTAN (COZAAR) 50 MG TABLET    Take 50 mg by mouth daily. Take 1 tablet daily to control blood pressure.   MEMANTINE (NAMENDA) 10 MG TABLET    Take 10 mg by mouth 2 (two) times daily. Take 1 tablet twice daily for dementia/Alzheimer's disease.   OMEGA-3 FATTY ACIDS (FISH OIL) 1200 MG CAPS    Take 1 capsule by mouth daily.   PANTOPRAZOLE (PROTONIX) 40 MG TABLET    Take 40 mg by mouth 2 (two) times daily. Take 1 tablet twice daily as needed to reduce stomach acid.   VITAMIN B-12 (CYANOCOBALAMIN) 1000 MCG TABLET    Take 1,000 mcg by mouth daily.  Modified Medications   No medications on file  Discontinued Medications   No medications on file     Physical Exam: Physical Exam  Constitutional: He is oriented to person, place, and time. He appears well-developed and well-nourished. No distress.  HENT:  Head: Normocephalic and atraumatic.  Right Ear: External ear normal.  Left Ear: External ear normal.  Nose: Nose normal.  Mouth/Throat: Oropharynx is clear and moist. No oropharyngeal exudate.  Eyes: Conjunctivae and EOM are normal. Pupils are equal, round, and reactive to light.  Neck: Normal range of motion. Neck supple. No JVD present. No thyromegaly present.  Cardiovascular:  Normal rate, regular rhythm and normal heart sounds.   No murmur heard. Pulmonary/Chest: Effort normal. No respiratory distress. He has no wheezes. He has rales in the right lower field and the left lower field. He exhibits no tenderness.  Abdominal: Soft. Bowel sounds are normal. He exhibits no distension and no mass. There is no tenderness. There is no rebound.  Musculoskeletal: Normal range of motion. He exhibits edema (trace in BLE).  Lymphadenopathy:    He has no cervical adenopathy.  Neurological: He is alert and oriented to person, place, and time. He has normal reflexes. No cranial nerve deficit. Coordination normal.  Skin: Skin is warm and dry. No rash noted. He is not diaphoretic. No erythema.  Psychiatric: He has a normal mood and affect. His behavior is normal. Judgment and thought content normal.    Filed Vitals:   04/08/13 1534  BP: 113/69  Pulse: 82  Temp: 97.5 F (36.4 C)  TempSrc: Tympanic  Resp: 18      Labs reviewed: Basic Metabolic Panel:  Recent Labs  09/81/19 02/05/13  NA 133* 137  K 4.6 4.7  BUN 36* 41*  CREATININE 1.5* 1.6*  TSH 1.76  --    Liver Function Tests:  Recent Labs  10/23/12  AST 12*  ALT 8*  ALKPHOS 89    CBC:  Recent Labs  02/05/13 02/17/13 04/02/13  WBC 4.6 4.1 4.3  HGB 8.7* 3.9* 8.7*  HCT 25* 27* 26*  PLT 88* 85* 77*       Assessment/Plan Nausea and vomiting in adult patient Nausea and vomiting x1day, afebrile and able to keep some fluid down, denied abd pain, will have Phenergan prn available to him and encourage fluid as possible. CBC and BMP in am.   Essential hypertension, benign Controlled on Losartan 50mg  daily            Diabetes mellitus Hgb A1c 7.2 3/27/149(was 7.4 07/22/12)-6.8 02/05/13. Takes Lantus  35units, Novolog 5 units with meals.            Depression Stable on Cymbalta 30mg               Dementia Stable on Namenda             CHF (congestive heart  failure) Well compensated on Furosemide 20mg , trace BLE edema-chronic.             Anemia Presently taking Fe, B12, Folic acid, normal Iron 114, B12 920, Folate >20.0 02/17/13, Hgb 8-9 is his new baseline, decrease Iron to 325mg  daily          CKD (chronic kidney disease) Creat 1.5s, on Furosemide 20mg . Only trace edema seen in BLE      Unspecified hypothyroidism Corrected with Levothyroxine , TSH 1.763 10/23/12              Family/ Staff Communication: observe the patient.  Goals of Care: SNF  Labs/tests ordered: CBC and BMP

## 2013-04-08 NOTE — Assessment & Plan Note (Signed)
Stable on Namenda    

## 2013-04-08 NOTE — Assessment & Plan Note (Signed)
Corrected with Levothyroxine 75mcg, TSH 1.763 10/23/12       

## 2013-04-08 NOTE — Assessment & Plan Note (Signed)
Presently taking Fe, B12, Folic acid, normal Iron 114, B12 920, Folate >20.0 02/17/13, Hgb 8-9 is his new baseline, decrease Iron to 325mg daily             

## 2013-04-13 ENCOUNTER — Non-Acute Institutional Stay (SKILLED_NURSING_FACILITY): Payer: Medicare Other | Admitting: Nurse Practitioner

## 2013-04-13 DIAGNOSIS — W19XXXD Unspecified fall, subsequent encounter: Secondary | ICD-10-CM

## 2013-04-13 DIAGNOSIS — F0390 Unspecified dementia without behavioral disturbance: Secondary | ICD-10-CM

## 2013-04-13 DIAGNOSIS — I509 Heart failure, unspecified: Secondary | ICD-10-CM

## 2013-04-13 DIAGNOSIS — E119 Type 2 diabetes mellitus without complications: Secondary | ICD-10-CM

## 2013-04-13 DIAGNOSIS — F329 Major depressive disorder, single episode, unspecified: Secondary | ICD-10-CM

## 2013-04-13 DIAGNOSIS — K269 Duodenal ulcer, unspecified as acute or chronic, without hemorrhage or perforation: Secondary | ICD-10-CM

## 2013-04-13 DIAGNOSIS — D649 Anemia, unspecified: Secondary | ICD-10-CM

## 2013-04-13 DIAGNOSIS — N189 Chronic kidney disease, unspecified: Secondary | ICD-10-CM

## 2013-04-13 DIAGNOSIS — E039 Hypothyroidism, unspecified: Secondary | ICD-10-CM

## 2013-04-13 DIAGNOSIS — K2970 Gastritis, unspecified, without bleeding: Secondary | ICD-10-CM

## 2013-04-13 DIAGNOSIS — W19XXXA Unspecified fall, initial encounter: Secondary | ICD-10-CM

## 2013-04-13 DIAGNOSIS — I10 Essential (primary) hypertension: Secondary | ICD-10-CM

## 2013-04-13 NOTE — Progress Notes (Signed)
Patient ID: [name] [name], male   DOB: May 16, 1920, 80 y.o.   MRN: [id number] Code Status: Living Will  Allergies  Allergen Reactions  . Enalapril Other (See Comments)    cough  . Milk-Related Compounds Other (See Comments)    Doesn't remember but its a mild reaction    Chief Complaint  Patient presents with  . Medical Managment of Chronic Issues    N/V/D, s/p fall    HPI: Patient is a 79 y.o. male seen in the SNF at [facility] today for evaluation of vomiting/diarrhea, s/p fall,  and other chronic medical conditions Problem List Items Addressed This Visit   Anemia     Presently taking Fe, B12, Folic acid, normal Iron 114, B12 920, Folate >20.0 02/17/13, Hgb 8-9 is his new baseline, decrease Iron to 325mg  daily              CHF (congestive heart failure)     Well compensated on Furosemide 20mg , trace BLE edema-chronic. Will hold Lasix for now due to his V/D and elevated Bun/creat 34/1.76-repeat BMP next Monday.               CKD (chronic kidney disease)     Creat 1.5s, on Furosemide 20mg . Only trace edema seen in BLE. Aggravated by recent acute N/V/D--hold Lasix and repeat BMP next Monday.           Dementia     Stable on Namenda                 Depression     Stable on Cymbalta 30mg                   Diabetes mellitus (Chronic)     Hgb A1c 7.2 3/27/149(was 7.4 07/22/12)-6.8 02/05/13. Takes Lantus 35units, Novolog 5 units with meals.                Duodenal ulcer     Stable on Protonix 40mg  bid, no c/o stomach pain, indigestion, or bloody stools.               Essential hypertension, benign     Controlled on Losartan 50mg  daily. May have to switch to BCB or CCB due to elevated creatinine.                 Fall at nursing home     [place] in bathroom 11:15 am 04/15/13. Denited hitting his head, no other injury noted. VS stable. Many falls due to his increased frailty with present acute  N/V/D and lack of safety awareness. intensive supervision and monitoring needed.     Unspecified gastritis and gastroduodenitis without mention of hemorrhage - Primary     N/V/D--on and off for about a week. Will obtain C-diff Toxin A/B and stool culture. Encourage oral fluid intake. Prn Imodium is available to him.     Unspecified hypothyroidism     Corrected with Levothyroxine , TSH 1.763 10/23/12 and 2.919 04/14/13                   Review of Systems:  Review of Systems  Constitutional: Negative for fever, chills, weight loss, malaise/fatigue and diaphoresis.  HENT: Positive for hearing loss. Negative for ear pain, congestion, sore throat and neck pain.   Eyes: Negative for pain, discharge and redness.  Respiratory: Negative for cough, sputum production, shortness of breath and wheezing.   Cardiovascular: Positive for leg swelling (trace). Negative for chest pain, palpitations, orthopnea, claudication and  PND.  Gastrointestinal: Positive for nausea, vomiting and diarrhea. Negative for heartburn, abdominal pain, constipation and blood in stool.  Genitourinary: Positive for frequency. Negative for dysuria, urgency, hematuria and flank pain.  Musculoskeletal: Positive for back pain, joint pain and falls. Negative for myalgias.  Skin: Negative for itching and rash.  Neurological: Negative for dizziness, tingling, tremors, sensory change, speech change, focal weakness, seizures, loss of consciousness, weakness and headaches.  Endo/Heme/Allergies: Negative for environmental allergies and polydipsia. Does not bruise/bleed easily.  Psychiatric/Behavioral: Positive for memory loss. Negative for depression and hallucinations. The patient is not nervous/anxious and does not have insomnia.      Past Medical History  Diagnosis Date  . Hypothyroidism   . Hypertension   . CHF (congestive heart failure)   . Diabetes mellitus   . Coronary artery disease   . Restless leg syndrome    . Hyperlipemia   . Renal disease     stage II  . Sleep apnea   . Cellulitis and abscess of leg   . Obesity   . Duodenal ulcer, perforated   . Diverticulosis   . Internal hemorrhoids   . CAD (coronary artery disease)   . Anemia   . Duodenal ulcer   . Allergy   . Blood transfusion   . Cataract   . Tuberculosis     as a child  . Contact dermatitis and other eczema, due to unspecified cause 10/06/2012  . Senile dementia with depressive features 05/26/2012  . Hyposmolality and/or hyponatremia 04/02/2012  . Diarrhea 03/26/2012  . Hemorrhage of rectum and anus 03/25/2012  . Pneumonia, organism unspecified 03/12/2012  . 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 Folate-deficiency anemia 01/07/2012  . Thrombocytopenia, unspecified 09/26/2011  . Loss of weight 09/11/2011  . Dysphagia, unspecified(787.20) 06/28/2011  . Chronic kidney disease, stage III (moderate) 03/22/2011  . Cramp of limb 08/10/2010  . Chronic kidney disease, stage II (mild) 03/22/2011  . Personal history of fall 05/05/2009  . Unspecified vitamin D deficiency 02/24/2009  . Blood in stool 11/18/2008  . Dermatophytosis of the body 06/23/2007  . Obesity, unspecified 06/23/2007  . Unspecified tinnitus 06/23/2007  . Edema 06/23/2007  . Shortness of breath 06/23/2007  . Flatulence, eructation, and gas pain 06/23/2007  . Other general symptoms(780.99) 06/23/2007  . Memory loss 08/13/2003   Past Surgical History  Procedure Laterality Date  . Laparotomy  09/12/2011    Procedure: EXPLORATORY LAPAROTOMY;  Surgeon: Rulon Abide, DO;  Location: [facility];  Service: General;  Laterality: N/A;  REPAIR PERFORATED ULCER  . Gastrostomy  09/12/2011    Procedure: GASTROSTOMY;  Surgeon: Rulon Abide, DO;  Location: [facility];  Service: General;  Laterality: N/A;  BIOPSY DUODENAL ULCER  . Shoulder surgery  [place], [place]  . Jejunostomy feeding tube      removed  . Back surgery  1942  . Eye  surgery  [phone]    cataracts  . Coronary artery bypass graft  2201   Social History:   reports that he has quit smoking. His smoking use included Cigarettes. He smoked 0.00 packs per day. He has never used smokeless tobacco. He reports that he does not drink alcohol or use illicit drugs.  Family History  Problem Relation Age of Onset  . Stroke Father     Medications: Patient's Medications  New Prescriptions   No medications on file  Previous Medications   CALCIUM CARBONATE-VITAMIN D (CALCIUM 600+D) 600-400 MG-UNIT PER TABLET  Take 1 tablet by mouth daily.   CHOLECALCIFEROL (VITAMIN D) 1000 UNITS TABLET    Take 1,000 Units by mouth daily. Take 1 tablet daily for vitamin D supplement.   DULOXETINE (CYMBALTA) 30 MG CAPSULE    Take 30 mg by mouth daily.   FERROUS SULFATE 325 (65 FE) MG TABLET    Take 325 mg by mouth 2 (two) times daily.   FOLIC ACID (FOLVITE) 1 MG TABLET       FUROSEMIDE (LASIX) 20 MG TABLET    Take 20 mg by mouth 2 (two) times daily. Take 1/2 tablet for fluid retention.   INSULIN ASPART (NOVOLOG) 100 UNIT/ML INJECTION    Inject 5 Units into the skin 3 (three) times daily before meals. For CBG>150   INSULIN GLARGINE (LANTUS) 100 UNIT/ML INJECTION    Inject 34 Units into the skin at bedtime.    LEVOTHYROXINE (SYNTHROID, LEVOTHROID) 75 MCG TABLET    Take 75 mcg by mouth daily. Take 1 tablet daily  for thyroid supplement.   LOSARTAN (COZAAR) 50 MG TABLET    Take 50 mg by mouth daily. Take 1 tablet daily to control blood pressure.   MEMANTINE (NAMENDA) 10 MG TABLET    Take 10 mg by mouth 2 (two) times daily. Take 1 tablet twice daily for dementia/Alzheimer's disease.   OMEGA-3 FATTY ACIDS (FISH OIL) 1200 MG CAPS    Take 1 capsule by mouth daily.   PANTOPRAZOLE (PROTONIX) 40 MG TABLET    Take 40 mg by mouth 2 (two) times daily. Take 1 tablet twice daily as needed to reduce stomach acid.   VITAMIN B-12 (CYANOCOBALAMIN) 1000 MCG TABLET    Take 1,000 mcg by mouth daily.   Modified Medications   No medications on file  Discontinued Medications   No medications on file     Physical Exam: Physical Exam  Constitutional: He is oriented to person, place, and time. He appears well-developed and well-nourished. No distress.  HENT:  Head: Normocephalic and atraumatic.  Right Ear: External ear normal.  Left Ear: External ear normal.  Nose: Nose normal.  Mouth/Throat: Oropharynx is clear and moist. No oropharyngeal exudate.  Eyes: Conjunctivae and EOM are normal. Pupils are equal, round, and reactive to light.  Neck: Normal range of motion. Neck supple. No JVD present. No thyromegaly present.  Cardiovascular: Normal rate, regular rhythm and normal heart sounds.   No murmur heard. Pulmonary/Chest: Effort normal. No respiratory distress. He has no wheezes. He has rales in the right lower field and the left lower field. He exhibits no tenderness.  Abdominal: Soft. Bowel sounds are normal. He exhibits no distension and no mass. There is no tenderness. There is no rebound.  Musculoskeletal: Normal range of motion. He exhibits edema (trace in BLE).  Lymphadenopathy:    He has no cervical adenopathy.  Neurological: He is alert and oriented to person, place, and time. He has normal reflexes. No cranial nerve deficit. Coordination normal.  Skin: Skin is warm and dry. No rash noted. He is not diaphoretic. No erythema.  Psychiatric: He has a normal mood and affect. His behavior is normal. Judgment and thought content normal.    Filed Vitals:   04/15/13 1402  BP: 134/72  Pulse: 72  Temp: 97.6 F (36.4 C)  TempSrc: Tympanic  Resp: 24      Labs reviewed: Basic Metabolic Panel:  Recent Labs  16/10/96 02/05/13 04/14/13  NA 133* 137 137  K 4.6 4.7 4.8  BUN 36* 41* 34*  CREATININE 1.5* 1.6* 1.8*  TSH 1.76  --  2.92   Liver Function Tests:  Recent Labs  10/23/12 04/14/13  AST 12* 10*  ALT 8* 8*  ALKPHOS 89 62    CBC:  Recent Labs  02/17/13  04/02/13 04/14/13  WBC 4.1 4.3 4.6  HGB 3.9* 8.7* 9.0*  HCT 27* 26* 26*  PLT 85* 77* 79*       Assessment/Plan Unspecified gastritis and gastroduodenitis without mention of hemorrhage N/V/D--on and off for about a week. Will obtain C-diff Toxin A/B and stool culture. Encourage oral fluid intake. Prn Imodium is available to him.   CHF (congestive heart failure) Well compensated on Furosemide 20mg , trace BLE edema-chronic. Will hold Lasix for now due to his V/D and elevated Bun/creat 34/1.76-repeat BMP next Monday.             Anemia Presently taking Fe, B12, Folic acid, normal Iron 114, B12 920, Folate >20.0 02/17/13, Hgb 8-9 is his new baseline, decrease Iron to 325mg  daily            CKD (chronic kidney disease) Creat 1.5s, on Furosemide 20mg . Only trace edema seen in BLE. Aggravated by recent acute N/V/D--hold Lasix and repeat BMP next Monday.         Diabetes mellitus Hgb A1c 7.2 3/27/149(was 7.4 07/22/12)-6.8 02/05/13. Takes Lantus 35units, Novolog 5 units with meals.              Duodenal ulcer Stable on Protonix 40mg  bid, no c/o stomach pain, indigestion, or bloody stools.             Unspecified hypothyroidism Corrected with Levothyroxine , TSH 1.763 10/23/12 and 2.919 04/14/13              Depression Stable on Cymbalta 30mg                 Dementia Stable on Namenda               Essential hypertension, benign Controlled on Losartan 50mg  daily. May have to switch to BCB or CCB due to elevated creatinine.               Fall at nursing home [place] in bathroom 11:15 am 04/15/13. Denited hitting his head, no other injury noted. VS stable. Many falls due to his increased frailty with present acute N/V/D and lack of safety awareness. intensive supervision and monitoring needed.     Family/ Staff Communication: observe the patient.  Goals of Care: SNF  Labs/tests ordered:  CBC and CMP and TSH done 04/14/13. C-diff Toxin A/B and stool culture for Campylobacter, Salmonella, Shigella.

## 2013-04-14 LAB — HEPATIC FUNCTION PANEL
AST: 10 U/L — AB (ref 14–40)
Alkaline Phosphatase: 62 U/L (ref 25–125)
Bilirubin, Total: 0.4 mg/dL

## 2013-04-14 LAB — CBC AND DIFFERENTIAL
HCT: 26 % — AB (ref 41–53)
Hemoglobin: 9 g/dL — AB (ref 13.5–17.5)

## 2013-04-14 LAB — BASIC METABOLIC PANEL: Sodium: 137 mmol/L (ref 137–147)

## 2013-04-15 ENCOUNTER — Encounter: Payer: Self-pay | Admitting: Nurse Practitioner

## 2013-04-15 DIAGNOSIS — K2970 Gastritis, unspecified, without bleeding: Secondary | ICD-10-CM | POA: Insufficient documentation

## 2013-04-15 NOTE — Assessment & Plan Note (Signed)
Controlled on Losartan 50mg  daily. May have to switch to BCB or CCB due to elevated creatinine.

## 2013-04-15 NOTE — Assessment & Plan Note (Addendum)
N/V/D--on and off for about a week. Will obtain C-diff Toxin A/B and stool culture. Encourage oral fluid intake. Prn Imodium is available to him.

## 2013-04-15 NOTE — Assessment & Plan Note (Signed)
Corrected with Levothyroxine 75mcg, TSH 1.763 10/23/12 and 2.919 04/14/13                   

## 2013-04-15 NOTE — Assessment & Plan Note (Signed)
Presently taking Fe, B12, Folic acid, normal Iron 114, B12 920, Folate >20.0 02/17/13, Hgb 8-9 is his new baseline, decrease Iron to 325mg daily             

## 2013-04-15 NOTE — Assessment & Plan Note (Signed)
Stable on Cymbalta 30mg  

## 2013-04-15 NOTE — Assessment & Plan Note (Signed)
Stable on Protonix 40mg bid, no c/o stomach pain, indigestion, or bloody stools.                    

## 2013-04-15 NOTE — Assessment & Plan Note (Signed)
Stable on Namenda    

## 2013-04-15 NOTE — Assessment & Plan Note (Signed)
Creat 1.5s, on Furosemide 20mg . Only trace edema seen in BLE. Aggravated by recent acute N/V/D--hold Lasix and repeat BMP next Monday.

## 2013-04-15 NOTE — Assessment & Plan Note (Signed)
Hgb A1c 7.2 3/27/149(was 7.4 07/22/12)-6.8 02/05/13. Takes Lantus 35units, Novolog 5 units with meals.                           

## 2013-04-15 NOTE — Assessment & Plan Note (Addendum)
Fell in bathroom 11:15 am 04/15/13. Denited hitting his head, no other injury noted. VS stable. Many falls due to his increased frailty with present acute N/V/D and lack of safety awareness. intensive supervision and monitoring needed.

## 2013-04-15 NOTE — Assessment & Plan Note (Signed)
Well compensated on Furosemide 20mg , trace BLE edema-chronic. Will hold Lasix for now due to his V/D and elevated Bun/creat 34/1.76-repeat BMP next Monday.

## 2013-04-20 ENCOUNTER — Non-Acute Institutional Stay (SKILLED_NURSING_FACILITY): Payer: Medicare Other | Admitting: Nurse Practitioner

## 2013-04-20 ENCOUNTER — Encounter: Payer: Self-pay | Admitting: Nurse Practitioner

## 2013-04-20 DIAGNOSIS — N189 Chronic kidney disease, unspecified: Secondary | ICD-10-CM

## 2013-04-20 DIAGNOSIS — F3289 Other specified depressive episodes: Secondary | ICD-10-CM

## 2013-04-20 DIAGNOSIS — R197 Diarrhea, unspecified: Secondary | ICD-10-CM

## 2013-04-20 DIAGNOSIS — E039 Hypothyroidism, unspecified: Secondary | ICD-10-CM

## 2013-04-20 DIAGNOSIS — E119 Type 2 diabetes mellitus without complications: Secondary | ICD-10-CM

## 2013-04-20 DIAGNOSIS — F0390 Unspecified dementia without behavioral disturbance: Secondary | ICD-10-CM

## 2013-04-20 DIAGNOSIS — F329 Major depressive disorder, single episode, unspecified: Secondary | ICD-10-CM

## 2013-04-20 DIAGNOSIS — I509 Heart failure, unspecified: Secondary | ICD-10-CM

## 2013-04-20 DIAGNOSIS — I10 Essential (primary) hypertension: Secondary | ICD-10-CM

## 2013-04-20 DIAGNOSIS — D649 Anemia, unspecified: Secondary | ICD-10-CM

## 2013-04-20 NOTE — Assessment & Plan Note (Signed)
Well compensated on Furosemide 20mg , trace BLE edema-chronic. Will hold Lasix for now due to his V/D and elevated Bun/creat 34/1.76-repeat BMP

## 2013-04-20 NOTE — Assessment & Plan Note (Signed)
Presently taking Fe, B12, Folic acid, normal Iron 114, B12 920, Folate >20.0 02/17/13, Hgb 8-9 is his new baseline, decrease Iron to 325mg  daily

## 2013-04-20 NOTE — Assessment & Plan Note (Signed)
Hgb A1c 7.2 3/27/149(was 7.4 07/22/12)-6.8 02/05/13. Takes Lantus 35units, Novolog 5 units with meals.                           

## 2013-04-20 NOTE — Assessment & Plan Note (Signed)
3-4 x/day for the past 2 days, denied abd pain, no nausea or vomiting, will add questran 4gm tid for 2 weeks, update CBC and BMP.

## 2013-04-20 NOTE — Assessment & Plan Note (Signed)
Creat 1.5s, on Furosemide 20mg . Only trace edema seen in BLE. Aggravated by recent acute N/V/D--hold Lasix and repeat BMP

## 2013-04-20 NOTE — Assessment & Plan Note (Signed)
Stable on Namenda    

## 2013-04-20 NOTE — Assessment & Plan Note (Signed)
Controlled on Losartan 50mg daily. May have to switch to BCB or CCB due to elevated creatinine.                  

## 2013-04-20 NOTE — Progress Notes (Signed)
Patient ID: [name] [name], male   DOB: November 08, 1919, 79 y.o.   MRN: [id number] Code Status: Living Will  Allergies  Allergen Reactions  . Enalapril Other (See Comments)    cough  . Milk-Related Compounds Other (See Comments)    Doesn't remember but its a mild reaction    Chief Complaint  Patient presents with  . Medical Managment of Chronic Issues    loose stools 3-4x/day    HPI: Patient is a 79 y.o. male seen in the SNF at [facility] today for evaluation of diarrhea and other chronic medical conditions Problem List Items Addressed This Visit   CHF (congestive heart failure)     Well compensated on Furosemide 20mg , trace BLE edema-chronic. Will hold Lasix for now due to his V/D and elevated Bun/creat 34/1.76-repeat BMP                 CKD (chronic kidney disease)     Creat 1.5s, on Furosemide 20mg . Only trace edema seen in BLE. Aggravated by recent acute N/V/D--hold Lasix and repeat BMP             Dementia     Stable on Namenda                   Depression     Stable on Cymbalta 30mg                     Diabetes mellitus (Chronic)     Hgb A1c 7.2 3/27/149(was 7.4 07/22/12)-6.8 02/05/13. Takes Lantus 35units, Novolog 5 units with meals.                Diarrhea     3-4 x/day for the past 2 days, denied abd pain, no nausea or vomiting, will add questran 4gm tid for 2 weeks, update CBC and BMP.     Essential hypertension, benign     Controlled on Losartan 50mg  daily. May have to switch to BCB or CCB due to elevated creatinine.                   UNSPECIFIED ANEMIA     Presently taking Fe, B12, Folic acid, normal Iron 114, B12 920, Folate >20.0 02/17/13, Hgb 8-9 is his new baseline, decrease Iron to 325mg  daily                Unspecified hypothyroidism - Primary     Corrected with Levothyroxine , TSH 1.763 10/23/12 and 2.919 04/14/13                     Review  of Systems:  Review of Systems  Constitutional: Negative for fever, chills, weight loss, malaise/fatigue and diaphoresis.  HENT: Positive for hearing loss. Negative for ear pain, congestion, sore throat and neck pain.   Eyes: Negative for pain, discharge and redness.  Respiratory: Negative for cough, sputum production, shortness of breath and wheezing.   Cardiovascular: Positive for leg swelling (trace). Negative for chest pain, palpitations, orthopnea, claudication and PND.  Gastrointestinal: Positive for diarrhea. Negative for heartburn, abdominal pain, constipation and blood in stool.  Genitourinary: Positive for frequency. Negative for dysuria, urgency, hematuria and flank pain.  Musculoskeletal: Positive for back pain and joint pain. Negative for myalgias.  Skin: Negative for itching and rash.  Neurological: Negative for dizziness, tingling, tremors, sensory change, speech change, focal weakness, seizures, loss of consciousness, weakness and headaches.  Endo/Heme/Allergies: Negative for environmental allergies and polydipsia. Does not bruise/bleed easily.  Psychiatric/Behavioral: Positive for memory loss. Negative for depression and hallucinations. The patient is not nervous/anxious and does not have insomnia.      Past Medical History  Diagnosis Date  . Hypothyroidism   . Hypertension   . CHF (congestive heart failure)   . Diabetes mellitus   . Coronary artery disease   . Restless leg syndrome   . Hyperlipemia   . Renal disease     stage II  . Sleep apnea   . Cellulitis and abscess of leg   . Obesity   . Duodenal ulcer, perforated   . Diverticulosis   . Internal hemorrhoids   . CAD (coronary artery disease)   . Anemia   . Duodenal ulcer   . Allergy   . Blood transfusion   . Cataract   . Tuberculosis     as a child  . Contact dermatitis and other eczema, due to unspecified cause 10/06/2012  . Senile dementia with depressive features 05/26/2012  . Hyposmolality and/or  hyponatremia 04/02/2012  . Diarrhea 03/26/2012  . Hemorrhage of rectum and anus 03/25/2012  . Pneumonia, organism unspecified 03/12/2012  . 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 Folate-deficiency anemia 01/07/2012  . Thrombocytopenia, unspecified 09/26/2011  . Loss of weight 09/11/2011  . Dysphagia, unspecified(787.20) 06/28/2011  . Chronic kidney disease, stage III (moderate) 03/22/2011  . Cramp of limb 08/10/2010  . Chronic kidney disease, stage II (mild) 03/22/2011  . Personal history of fall 05/05/2009  . Unspecified vitamin D deficiency 02/24/2009  . Blood in stool 11/18/2008  . Dermatophytosis of the body 06/23/2007  . Obesity, unspecified 06/23/2007  . Unspecified tinnitus 06/23/2007  . Edema 06/23/2007  . Shortness of breath 06/23/2007  . Flatulence, eructation, and gas pain 06/23/2007  . Other general symptoms(780.99) 06/23/2007  . Memory loss 08/13/2003   Past Surgical History  Procedure Laterality Date  . Laparotomy  09/12/2011    Procedure: EXPLORATORY LAPAROTOMY;  Surgeon: Rulon Abide, DO;  Location: [facility];  Service: General;  Laterality: N/A;  REPAIR PERFORATED ULCER  . Gastrostomy  09/12/2011    Procedure: GASTROSTOMY;  Surgeon: Rulon Abide, DO;  Location: [facility];  Service: General;  Laterality: N/A;  BIOPSY DUODENAL ULCER  . Shoulder surgery  [place], [place]  . Jejunostomy feeding tube      removed  . Back surgery  1942  . Eye surgery  [phone]    cataracts  . Coronary artery bypass graft  2201   Social History:   reports that he has quit smoking. His smoking use included Cigarettes. He smoked 0.00 packs per day. He has never used smokeless tobacco. He reports that he does not drink alcohol or use illicit drugs.  Family History  Problem Relation Age of Onset  . Stroke Father     Medications: Patient's Medications  New Prescriptions   No medications on file  Previous Medications   CALCIUM  CARBONATE-VITAMIN D (CALCIUM 600+D) 600-400 MG-UNIT PER TABLET    Take 1 tablet by mouth daily.   CHOLECALCIFEROL (VITAMIN D) 1000 UNITS TABLET    Take 1,000 Units by mouth daily. Take 1 tablet daily for vitamin D supplement.   DULOXETINE (CYMBALTA) 30 MG CAPSULE    Take 30 mg by mouth daily.   FERROUS SULFATE 325 (65 FE) MG TABLET    Take 325 mg by mouth 2 (two) times daily.   FOLIC ACID (FOLVITE) 1 MG TABLET  FUROSEMIDE (LASIX) 20 MG TABLET    Take 20 mg by mouth 2 (two) times daily. Take 1/2 tablet for fluid retention.   INSULIN ASPART (NOVOLOG) 100 UNIT/ML INJECTION    Inject 5 Units into the skin 3 (three) times daily before meals. For CBG>150   INSULIN GLARGINE (LANTUS) 100 UNIT/ML INJECTION    Inject 34 Units into the skin at bedtime.    LEVOTHYROXINE (SYNTHROID, LEVOTHROID) 75 MCG TABLET    Take 75 mcg by mouth daily. Take 1 tablet daily  for thyroid supplement.   LOSARTAN (COZAAR) 50 MG TABLET    Take 50 mg by mouth daily. Take 1 tablet daily to control blood pressure.   MEMANTINE (NAMENDA) 10 MG TABLET    Take 10 mg by mouth 2 (two) times daily. Take 1 tablet twice daily for dementia/Alzheimer's disease.   OMEGA-3 FATTY ACIDS (FISH OIL) 1200 MG CAPS    Take 1 capsule by mouth daily.   PANTOPRAZOLE (PROTONIX) 40 MG TABLET    Take 40 mg by mouth 2 (two) times daily. Take 1 tablet twice daily as needed to reduce stomach acid.   VITAMIN B-12 (CYANOCOBALAMIN) 1000 MCG TABLET    Take 1,000 mcg by mouth daily.  Modified Medications   No medications on file  Discontinued Medications   No medications on file     Physical Exam: Physical Exam  Constitutional: He is oriented to person, place, and time. He appears well-developed and well-nourished. No distress.  HENT:  Head: Normocephalic and atraumatic.  Right Ear: External ear normal.  Left Ear: External ear normal.  Nose: Nose normal.  Mouth/Throat: Oropharynx is clear and moist. No oropharyngeal exudate.  Eyes: Conjunctivae and  EOM are normal. Pupils are equal, round, and reactive to light.  Neck: Normal range of motion. Neck supple. No JVD present. No thyromegaly present.  Cardiovascular: Normal rate, regular rhythm and normal heart sounds.   No murmur heard. Pulmonary/Chest: Effort normal. No respiratory distress. He has no wheezes. He has rales in the right lower field and the left lower field. He exhibits no tenderness.  Abdominal: Soft. Bowel sounds are normal. He exhibits no distension and no mass. There is no tenderness. There is no rebound.  Musculoskeletal: Normal range of motion. He exhibits edema (trace in BLE).  Lymphadenopathy:    He has no cervical adenopathy.  Neurological: He is alert and oriented to person, place, and time. He has normal reflexes. No cranial nerve deficit. Coordination normal.  Skin: Skin is warm and dry. No rash noted. He is not diaphoretic. No erythema.  Psychiatric: He has a normal mood and affect. His behavior is normal. Judgment and thought content normal.    Filed Vitals:   04/20/13 1108  BP: 113/69  Pulse: 82  Temp: 97.5 F (36.4 C)  TempSrc: Tympanic  Resp: 18      Labs reviewed: Basic Metabolic Panel:  Recent Labs  16/10/96 02/05/13 04/14/13  NA 133* 137 137  K 4.6 4.7 4.8  BUN 36* 41* 34*  CREATININE 1.5* 1.6* 1.8*  TSH 1.76  --  2.92   Liver Function Tests:  Recent Labs  10/23/12 04/14/13  AST 12* 10*  ALT 8* 8*  ALKPHOS 89 62    CBC:  Recent Labs  02/17/13 04/02/13 04/14/13  WBC 4.1 4.3 4.6  HGB 3.9* 8.7* 9.0*  HCT 27* 26* 26*  PLT 85* 77* 79*       Assessment/Plan Unspecified hypothyroidism Corrected with Levothyroxine , TSH 1.763 10/23/12 and  2.919 04/14/13                UNSPECIFIED ANEMIA Presently taking Fe, B12, Folic acid, normal Iron 114, B12 920, Folate >20.0 02/17/13, Hgb 8-9 is his new baseline, decrease Iron to 325mg  daily              Essential hypertension, benign Controlled on Losartan  50mg  daily. May have to switch to BCB or CCB due to elevated creatinine.                 Depression Stable on Cymbalta 30mg                   CHF (congestive heart failure) Well compensated on Furosemide 20mg , trace BLE edema-chronic. Will hold Lasix for now due to his V/D and elevated Bun/creat 34/1.76-repeat BMP               CKD (chronic kidney disease) Creat 1.5s, on Furosemide 20mg . Only trace edema seen in BLE. Aggravated by recent acute N/V/D--hold Lasix and repeat BMP           Dementia Stable on Namenda                 Diabetes mellitus Hgb A1c 7.2 3/27/149(was 7.4 07/22/12)-6.8 02/05/13. Takes Lantus 35units, Novolog 5 units with meals.              Diarrhea 3-4 x/day for the past 2 days, denied abd pain, no nausea or vomiting, will add questran 4gm tid for 2 weeks, update CBC and BMP.     Family/ Staff Communication: observe the patient.  Goals of Care: SNF  Labs/tests ordered: CBC and CMP

## 2013-04-20 NOTE — Assessment & Plan Note (Signed)
Corrected with Levothyroxine , TSH 1.763 10/23/12 and 2.919 04/14/13

## 2013-04-20 NOTE — Assessment & Plan Note (Signed)
Stable on Cymbalta 30mg  

## 2013-04-21 LAB — BASIC METABOLIC PANEL
BUN: 39 mg/dL — AB (ref 4–21)
Creatinine: 2 mg/dL — AB (ref 0.6–1.3)
Sodium: 137 mmol/L (ref 137–147)

## 2013-04-21 LAB — CBC AND DIFFERENTIAL: Platelets: 71 10*3/uL — AB (ref 150–399)

## 2013-05-11 ENCOUNTER — Encounter: Payer: Self-pay | Admitting: Nurse Practitioner

## 2013-05-11 ENCOUNTER — Non-Acute Institutional Stay (SKILLED_NURSING_FACILITY): Payer: Medicare Other | Admitting: Nurse Practitioner

## 2013-05-11 DIAGNOSIS — I509 Heart failure, unspecified: Secondary | ICD-10-CM

## 2013-05-11 DIAGNOSIS — F329 Major depressive disorder, single episode, unspecified: Secondary | ICD-10-CM

## 2013-05-11 DIAGNOSIS — E119 Type 2 diabetes mellitus without complications: Secondary | ICD-10-CM

## 2013-05-11 DIAGNOSIS — E039 Hypothyroidism, unspecified: Secondary | ICD-10-CM

## 2013-05-11 DIAGNOSIS — I10 Essential (primary) hypertension: Secondary | ICD-10-CM

## 2013-05-11 DIAGNOSIS — D649 Anemia, unspecified: Secondary | ICD-10-CM

## 2013-05-11 DIAGNOSIS — F0390 Unspecified dementia without behavioral disturbance: Secondary | ICD-10-CM

## 2013-05-11 DIAGNOSIS — R197 Diarrhea, unspecified: Secondary | ICD-10-CM

## 2013-05-11 DIAGNOSIS — K269 Duodenal ulcer, unspecified as acute or chronic, without hemorrhage or perforation: Secondary | ICD-10-CM

## 2013-05-11 NOTE — Assessment & Plan Note (Signed)
Stable on Protonix 40mg bid, no c/o stomach pain, indigestion, or bloody stools.                    

## 2013-05-11 NOTE — Assessment & Plan Note (Signed)
Presently taking Fe, B12, Folic acid, normal Iron 114, B12 920, Folate >20.0 02/17/13, Hgb 8-9 is his new baseline, decrease Iron to 325mg  daily

## 2013-05-11 NOTE — Assessment & Plan Note (Signed)
Stable on Namenda    

## 2013-05-11 NOTE — Assessment & Plan Note (Signed)
Corrected with Levothyroxine 75mcg, TSH 1.763 10/23/12 and 2.919 04/14/13                   

## 2013-05-11 NOTE — Assessment & Plan Note (Signed)
Stable on Cymbalta 30mg  

## 2013-05-11 NOTE — Assessment & Plan Note (Signed)
Hgb A1c 7.2 3/27/149(was 7.4 07/22/12)-6.8 02/05/13. Takes Lantus 35units, Novolog 5 units with meals.                           

## 2013-05-11 NOTE — Assessment & Plan Note (Signed)
Well compensated on Furosemide 20mg , trace BLE edema-chronic. Will hold Lasix for now due to his V/D and elevated creatinine 1.5-2.0.

## 2013-05-11 NOTE — Assessment & Plan Note (Signed)
3-4 x/day for the past 2 days, denied abd pain, no nausea or vomiting, resolved after questran 4gm tid for 2 weeks(started 04/20/13)

## 2013-05-11 NOTE — Progress Notes (Signed)
Patient ID: [name] [name], male   DOB: 1919/09/03, 79 y.o.   MRN: [id number]  Code Status: Living Will  Allergies  Allergen Reactions  . Enalapril Other (See Comments)    cough  . Milk-Related Compounds Other (See Comments)    Doesn't remember but its a mild reaction    Chief Complaint  Patient presents with  . Medical Managment of Chronic Issues    HPI: Patient is a 79 y.o. male seen in the SNF at [facility] today for evaluation of chronic medical conditions Problem List Items Addressed This Visit   Anemia     Presently taking Fe, B12, Folic acid, normal Iron 114, B12 920, Folate >20.0 02/17/13, Hgb 8-9 is his new baseline, decrease Iron to 325mg  daily                CHF (congestive heart failure)     Well compensated on Furosemide 20mg , trace BLE edema-chronic. Will hold Lasix for now due to his V/D and elevated creatinine 1.5-2.0.                   Dementia     Stable on Namenda                     Depression     Stable on Cymbalta 30mg                       Diabetes mellitus - Primary (Chronic)     Hgb A1c 7.2 3/27/149(was 7.4 07/22/12)-6.8 02/05/13. Takes Lantus 35units, Novolog 5 units with meals.                  Diarrhea     3-4 x/day for the past 2 days, denied abd pain, no nausea or vomiting, resolved after questran 4gm tid for 2 weeks(started 04/20/13)      Duodenal ulcer     Stable on Protonix 40mg  bid, no c/o stomach pain, indigestion, or bloody stools.                 Essential hypertension, benign     Controlled on Losartan 50mg  daily. May have to switch to BCB or CCB due to elevated creatinine.                     Unspecified hypothyroidism     Corrected with Levothyroxine , TSH 1.763 10/23/12 and 2.919 04/14/13                       Review of Systems:  Review of Systems  Constitutional: Negative for fever, chills,  weight loss, malaise/fatigue and diaphoresis.  HENT: Positive for hearing loss. Negative for congestion, ear pain and sore throat.   Eyes: Negative for pain, discharge and redness.  Respiratory: Negative for cough, sputum production, shortness of breath and wheezing.   Cardiovascular: Positive for leg swelling (trace). Negative for chest pain, palpitations, orthopnea, claudication and PND.  Gastrointestinal: Negative for heartburn, abdominal pain, diarrhea, constipation and blood in stool.  Genitourinary: Positive for frequency. Negative for dysuria, urgency, hematuria and flank pain.  Musculoskeletal: Positive for back pain and joint pain. Negative for myalgias and neck pain.  Skin: Negative for itching and rash.  Neurological: Negative for dizziness, tingling, tremors, sensory change, speech change, focal weakness, seizures, loss of consciousness, weakness and headaches.  Endo/Heme/Allergies: Negative for environmental allergies and polydipsia. Does not bruise/bleed easily.  Psychiatric/Behavioral: Positive for memory loss.  Negative for depression and hallucinations. The patient is not nervous/anxious and does not have insomnia.      Past Medical History  Diagnosis Date  . Hypothyroidism   . Hypertension   . CHF (congestive heart failure)   . Diabetes mellitus   . Coronary artery disease   . Restless leg syndrome   . Hyperlipemia   . Renal disease     stage II  . Sleep apnea   . Cellulitis and abscess of leg   . Obesity   . Duodenal ulcer, perforated   . Diverticulosis   . Internal hemorrhoids   . CAD (coronary artery disease)   . Anemia   . Duodenal ulcer   . Allergy   . Blood transfusion   . Cataract   . Tuberculosis     as a child  . Contact dermatitis and other eczema, due to unspecified cause 10/06/2012  . Senile dementia with depressive features 05/26/2012  . Hyposmolality and/or hyponatremia 04/02/2012  . Diarrhea 03/26/2012  . Hemorrhage of rectum and anus  03/25/2012  . Pneumonia, organism unspecified 03/12/2012  . 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 Folate-deficiency anemia 01/07/2012  . Thrombocytopenia, unspecified 09/26/2011  . Loss of weight 09/11/2011  . Dysphagia, unspecified(787.20) 06/28/2011  . Chronic kidney disease, stage III (moderate) 03/22/2011  . Cramp of limb 08/10/2010  . Chronic kidney disease, stage II (mild) 03/22/2011  . Personal history of fall 05/05/2009  . Unspecified vitamin D deficiency 02/24/2009  . Blood in stool 11/18/2008  . Dermatophytosis of the body 06/23/2007  . Obesity, unspecified 06/23/2007  . Unspecified tinnitus 06/23/2007  . Edema 06/23/2007  . Shortness of breath 06/23/2007  . Flatulence, eructation, and gas pain 06/23/2007  . Other general symptoms(780.99) 06/23/2007  . Memory loss 08/13/2003   Past Surgical History  Procedure Laterality Date  . Laparotomy  09/12/2011    Procedure: EXPLORATORY LAPAROTOMY;  Surgeon: Rulon Abide, DO;  Location: [facility];  Service: General;  Laterality: N/A;  REPAIR PERFORATED ULCER  . Gastrostomy  09/12/2011    Procedure: GASTROSTOMY;  Surgeon: Rulon Abide, DO;  Location: [facility];  Service: General;  Laterality: N/A;  BIOPSY DUODENAL ULCER  . Shoulder surgery  [place], [place]  . Jejunostomy feeding tube      removed  . Back surgery  1942  . Eye surgery  [phone]    cataracts  . Coronary artery bypass graft  2201   Social History:   reports that he has quit smoking. His smoking use included Cigarettes. He smoked 0.00 packs per day. He has never used smokeless tobacco. He reports that he does not drink alcohol or use illicit drugs.  Family History  Problem Relation Age of Onset  . Stroke Father     Medications: Patient's Medications  New Prescriptions   No medications on file  Previous Medications   CALCIUM CARBONATE-VITAMIN D (CALCIUM 600+D) 600-400 MG-UNIT PER TABLET    Take 1 tablet by mouth  daily.   CHOLECALCIFEROL (VITAMIN D) 1000 UNITS TABLET    Take 1,000 Units by mouth daily. Take 1 tablet daily for vitamin D supplement.   DULOXETINE (CYMBALTA) 30 MG CAPSULE    Take 30 mg by mouth daily.   FERROUS SULFATE 325 (65 FE) MG TABLET    Take 325 mg by mouth 2 (two) times daily.   FOLIC ACID (FOLVITE) 1 MG TABLET       FUROSEMIDE (LASIX) 20 MG TABLET  Take 20 mg by mouth 2 (two) times daily. Take 1/2 tablet for fluid retention.   INSULIN ASPART (NOVOLOG) 100 UNIT/ML INJECTION    Inject 5 Units into the skin 3 (three) times daily before meals. For CBG>150   INSULIN GLARGINE (LANTUS) 100 UNIT/ML INJECTION    Inject 34 Units into the skin at bedtime.    LEVOTHYROXINE (SYNTHROID, LEVOTHROID) 75 MCG TABLET    Take 75 mcg by mouth daily. Take 1 tablet daily  for thyroid supplement.   LOSARTAN (COZAAR) 50 MG TABLET    Take 50 mg by mouth daily. Take 1 tablet daily to control blood pressure.   MEMANTINE (NAMENDA) 10 MG TABLET    Take 10 mg by mouth 2 (two) times daily. Take 1 tablet twice daily for dementia/Alzheimer's disease.   OMEGA-3 FATTY ACIDS (FISH OIL) 1200 MG CAPS    Take 1 capsule by mouth daily.   PANTOPRAZOLE (PROTONIX) 40 MG TABLET    Take 40 mg by mouth 2 (two) times daily. Take 1 tablet twice daily as needed to reduce stomach acid.   VITAMIN B-12 (CYANOCOBALAMIN) 1000 MCG TABLET    Take 1,000 mcg by mouth daily.  Modified Medications   No medications on file  Discontinued Medications   No medications on file     Physical Exam: Physical Exam  Constitutional: He is oriented to person, place, and time. He appears well-developed and well-nourished. No distress.  HENT:  Head: Normocephalic and atraumatic.  Right Ear: External ear normal.  Left Ear: External ear normal.  Nose: Nose normal.  Mouth/Throat: Oropharynx is clear and moist. No oropharyngeal exudate.  Eyes: Conjunctivae and EOM are normal. Pupils are equal, round, and reactive to light.  Neck: Normal range of  motion. Neck supple. No JVD present. No thyromegaly present.  Cardiovascular: Normal rate, regular rhythm and normal heart sounds.   No murmur heard. Pulmonary/Chest: Effort normal. No respiratory distress. He has no wheezes. He has rales in the right lower field and the left lower field. He exhibits no tenderness.  Abdominal: Soft. Bowel sounds are normal. He exhibits no distension and no mass. There is no tenderness. There is no rebound.  Musculoskeletal: Normal range of motion. He exhibits edema (trace in BLE).  Lymphadenopathy:    He has no cervical adenopathy.  Neurological: He is alert and oriented to person, place, and time. He has normal reflexes. No cranial nerve deficit. Coordination normal.  Skin: Skin is warm and dry. No rash noted. He is not diaphoretic. No erythema.  Psychiatric: He has a normal mood and affect. His behavior is normal. Judgment and thought content normal.    Filed Vitals:   05/11/13 1518  BP: 122/70  Pulse: 60  Temp: 97.9 F (36.6 C)  TempSrc: Tympanic  Resp: 18      Labs reviewed: Basic Metabolic Panel:  Recent Labs  16/10/96 02/05/13 04/14/13 04/21/13  NA 133* 137 137 137  K 4.6 4.7 4.8 5.1  BUN 36* 41* 34* 39*  CREATININE 1.5* 1.6* 1.8* 2.0*  TSH 1.76  --  2.92  --    Liver Function Tests:  Recent Labs  10/23/12 04/14/13  AST 12* 10*  ALT 8* 8*  ALKPHOS 89 62    CBC:  Recent Labs  04/02/13 04/14/13 04/21/13  WBC 4.3 4.6 4.5  HGB 8.7* 9.0* 8.5*  HCT 26* 26* 39*  PLT 77* 79* 71*       Assessment/Plan Diabetes mellitus Hgb A1c 7.2 3/27/149(was 7.4 07/22/12)-6.8 02/05/13. Takes  Lantus 35units, Novolog 5 units with meals.                Unspecified hypothyroidism Corrected with Levothyroxine , TSH 1.763 10/23/12 and 2.919 04/14/13                  Depression Stable on Cymbalta 30mg                     Anemia Presently taking Fe, B12, Folic acid, normal Iron 114, B12 920,  Folate >20.0 02/17/13, Hgb 8-9 is his new baseline, decrease Iron to 325mg  daily              CHF (congestive heart failure) Well compensated on Furosemide 20mg , trace BLE edema-chronic. Will hold Lasix for now due to his V/D and elevated creatinine 1.5-2.0.                 Essential hypertension, benign Controlled on Losartan 50mg  daily. May have to switch to BCB or CCB due to elevated creatinine.                   Duodenal ulcer Stable on Protonix 40mg  bid, no c/o stomach pain, indigestion, or bloody stools.               Dementia Stable on Namenda                   Diarrhea 3-4 x/day for the past 2 days, denied abd pain, no nausea or vomiting, resolved after questran 4gm tid for 2 weeks(started 04/20/13)      Family/ Staff Communication: observe the patient.  Goals of Care: SNF  Labs/tests ordered: none

## 2013-05-11 NOTE — Assessment & Plan Note (Signed)
Controlled on Losartan 50mg  daily. May have to switch to BCB or CCB due to elevated creatinine.

## 2013-06-03 ENCOUNTER — Non-Acute Institutional Stay (SKILLED_NURSING_FACILITY): Payer: Medicare Other | Admitting: Nurse Practitioner

## 2013-06-03 DIAGNOSIS — D649 Anemia, unspecified: Secondary | ICD-10-CM

## 2013-06-03 DIAGNOSIS — F329 Major depressive disorder, single episode, unspecified: Secondary | ICD-10-CM

## 2013-06-03 DIAGNOSIS — L02419 Cutaneous abscess of limb, unspecified: Secondary | ICD-10-CM

## 2013-06-03 DIAGNOSIS — N390 Urinary tract infection, site not specified: Secondary | ICD-10-CM

## 2013-06-03 DIAGNOSIS — I10 Essential (primary) hypertension: Secondary | ICD-10-CM

## 2013-06-03 DIAGNOSIS — F0390 Unspecified dementia without behavioral disturbance: Secondary | ICD-10-CM

## 2013-06-03 DIAGNOSIS — E119 Type 2 diabetes mellitus without complications: Secondary | ICD-10-CM

## 2013-06-03 DIAGNOSIS — I509 Heart failure, unspecified: Secondary | ICD-10-CM

## 2013-06-03 DIAGNOSIS — E039 Hypothyroidism, unspecified: Secondary | ICD-10-CM

## 2013-06-03 DIAGNOSIS — K269 Duodenal ulcer, unspecified as acute or chronic, without hemorrhage or perforation: Secondary | ICD-10-CM

## 2013-06-04 ENCOUNTER — Encounter: Payer: Self-pay | Admitting: Nurse Practitioner

## 2013-06-04 DIAGNOSIS — N390 Urinary tract infection, site not specified: Secondary | ICD-10-CM | POA: Insufficient documentation

## 2013-06-04 DIAGNOSIS — L02419 Cutaneous abscess of limb, unspecified: Secondary | ICD-10-CM | POA: Insufficient documentation

## 2013-06-04 NOTE — Assessment & Plan Note (Signed)
Presently taking Fe, B12, Folic acid, normal Iron 114, B12 920, Folate >20.0 02/17/13, Hgb 8-9 is his new baseline, decrease Iron to 325mg  daily since 03/18/13. Hgb 8.5 04/21/13

## 2013-06-04 NOTE — Assessment & Plan Note (Signed)
Stable on Protonix 40mg bid, no c/o stomach pain, indigestion, or bloody stools.                    

## 2013-06-04 NOTE — Assessment & Plan Note (Signed)
Stable on Namenda    

## 2013-06-04 NOTE — Assessment & Plan Note (Signed)
takes Cymbalta 30mg(was reduced from 60mg due to his falling). Staff reported the patient has increased emotional outburst--will treat UTI-may have to increase Cymbalta to 60mg if no better in his mood. He stated he sleeps and eats well.                        

## 2013-06-04 NOTE — Assessment & Plan Note (Signed)
Hgb A1c 7.2 3/27/149(was 7.4 07/22/12)-6.8 02/05/13. Takes Lantus 35units, Novolog 5 units with meals.                           

## 2013-06-04 NOTE — Assessment & Plan Note (Addendum)
Asymptomatic, urine culture 06/04/13 E Coli 40,000c/ml, will treat with Cipro 250mg  bid for 10 days along with FloraStor since his mood is not stable and he is diabetic. Observe for stabilization of his mood.

## 2013-06-04 NOTE — Progress Notes (Signed)
Patient ID: [name] [name], male   DOB: 1919/10/13, 79 y.o.   MRN: [id number]  Code Status: Living Will  Allergies  Allergen Reactions  . Enalapril Other (See Comments)    cough  . Milk-Related Compounds Other (See Comments)    Doesn't remember but its a mild reaction    Chief Complaint  Patient presents with  . Medical Managment of Chronic Issues    UTI, RLE cellulitis  . Acute Visit    HPI: Patient is a 79 y.o. male seen in the SNF at [facility] today for evaluation of UTI, change of mood, and chronic medical conditions Problem List Items Addressed This Visit   Anemia     Presently taking Fe, B12, Folic acid, normal Iron 114, B12 920, Folate >20.0 02/17/13, Hgb 8-9 is his new baseline, decrease Iron to 325mg  daily since 03/18/13. Hgb 8.5 04/21/13                  Cellulitis and abscess of leg - Primary     Right shin many open areas-warmth and redness seen-will apply Silvadene cream bid to affected areas until healed.     CHF (congestive heart failure)     Well compensated on Furosemide 20mg , trace BLE edema-chronic. Will hold Lasix for now due to his V/D and elevated creatinine 1.5-2.0.                     Dementia     Stable on Namenda                       Depression     takes Cymbalta 30mg (was reduced from 60mg  due to his falling). Staff reported the patient has increased emotional outburst--will treat UTI-may have to increase Cymbalta to 60mg  if no better in his mood. He stated he sleeps and eats well.                         Diabetes mellitus (Chronic)     Hgb A1c 7.2 3/27/149(was 7.4 07/22/12)-6.8 02/05/13. Takes Lantus 35units, Novolog 5 units with meals.                    Duodenal ulcer     Stable on Protonix 40mg  bid, no c/o stomach pain, indigestion, or bloody stools.                   Essential hypertension, benign     Controlled on Losartan 50mg  daily.  May have to switch to BCB or CCB due to elevated creatinine.                       Unspecified hypothyroidism     Corrected with Levothyroxine , TSH 1.763 10/23/12 and 2.919 04/14/13                      Urinary tract infection, site not specified     Asymptomatic, urine culture 06/04/13 E Coli 40,000c/ml, will treat with Cipro 250mg  bid for 10 days along with FloraStor since his mood is not stable and he is diabetic. Observe for stabilization of his mood.        Review of Systems:  Review of Systems  Constitutional: Negative for fever, chills, weight loss, malaise/fatigue and diaphoresis.  HENT: Positive for hearing loss. Negative for congestion, ear pain and sore throat.   Eyes: Negative for pain,  discharge and redness.  Respiratory: Negative for cough, sputum production, shortness of breath and wheezing.   Cardiovascular: Positive for leg swelling (trace). Negative for chest pain, palpitations, orthopnea, claudication and PND.  Gastrointestinal: Negative for heartburn, abdominal pain, diarrhea, constipation and blood in stool.  Genitourinary: Positive for frequency. Negative for dysuria, urgency, hematuria and flank pain.  Musculoskeletal: Positive for back pain and joint pain. Negative for myalgias and neck pain.  Skin: Negative for itching and rash.  Neurological: Negative for dizziness, tingling, tremors, sensory change, speech change, focal weakness, seizures, loss of consciousness, weakness and headaches.  Endo/Heme/Allergies: Negative for environmental allergies and polydipsia. Does not bruise/bleed easily.  Psychiatric/Behavioral: Positive for memory loss. Negative for depression and hallucinations. The patient is nervous/anxious. The patient does not have insomnia.      Past Medical History  Diagnosis Date  . Hypothyroidism   . Hypertension   . CHF (congestive heart failure)   . Diabetes mellitus   . Coronary artery disease   .  Restless leg syndrome   . Hyperlipemia   . Renal disease     stage II  . Sleep apnea   . Cellulitis and abscess of leg   . Obesity   . Duodenal ulcer, perforated   . Diverticulosis   . Internal hemorrhoids   . CAD (coronary artery disease)   . Anemia   . Duodenal ulcer   . Allergy   . Blood transfusion   . Cataract   . Tuberculosis     as a child  . Contact dermatitis and other eczema, due to unspecified cause 10/06/2012  . Senile dementia with depressive features 05/26/2012  . Hyposmolality and/or hyponatremia 04/02/2012  . Diarrhea 03/26/2012  . Hemorrhage of rectum and anus 03/25/2012  . Pneumonia, organism unspecified 03/12/2012  . 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 Folate-deficiency anemia 01/07/2012  . Thrombocytopenia, unspecified 09/26/2011  . Loss of weight 09/11/2011  . Dysphagia, unspecified(787.20) 06/28/2011  . Chronic kidney disease, stage III (moderate) 03/22/2011  . Cramp of limb 08/10/2010  . Chronic kidney disease, stage II (mild) 03/22/2011  . Personal history of fall 05/05/2009  . Unspecified vitamin D deficiency 02/24/2009  . Blood in stool 11/18/2008  . Dermatophytosis of the body 06/23/2007  . Obesity, unspecified 06/23/2007  . Unspecified tinnitus 06/23/2007  . Edema 06/23/2007  . Shortness of breath 06/23/2007  . Flatulence, eructation, and gas pain 06/23/2007  . Other general symptoms(780.99) 06/23/2007  . Memory loss 08/13/2003   Past Surgical History  Procedure Laterality Date  . Laparotomy  09/12/2011    Procedure: EXPLORATORY LAPAROTOMY;  Surgeon: Rulon Abide, DO;  Location: [facility];  Service: General;  Laterality: N/A;  REPAIR PERFORATED ULCER  . Gastrostomy  09/12/2011    Procedure: GASTROSTOMY;  Surgeon: Rulon Abide, DO;  Location: [facility];  Service: General;  Laterality: N/A;  BIOPSY DUODENAL ULCER  . Shoulder surgery  [place], [place]  . Jejunostomy feeding tube      removed  .  Back surgery  1942  . Eye surgery  [phone]    cataracts  . Coronary artery bypass graft  2201   Social History:   reports that he has quit smoking. His smoking use included Cigarettes. He smoked 0.00 packs per day. He has never used smokeless tobacco. He reports that he does not drink alcohol or use illicit drugs.  Family History  Problem Relation Age of Onset  . Stroke Father  Medications: Patient's Medications  New Prescriptions   No medications on file  Previous Medications   CALCIUM CARBONATE-VITAMIN D (CALCIUM 600+D) 600-400 MG-UNIT PER TABLET    Take 1 tablet by mouth daily.   CHOLECALCIFEROL (VITAMIN D) 1000 UNITS TABLET    Take 1,000 Units by mouth daily. Take 1 tablet daily for vitamin D supplement.   DULOXETINE (CYMBALTA) 30 MG CAPSULE    Take 30 mg by mouth daily.   FERROUS SULFATE 325 (65 FE) MG TABLET    Take 325 mg by mouth 2 (two) times daily.   FOLIC ACID (FOLVITE) 1 MG TABLET       FUROSEMIDE (LASIX) 20 MG TABLET    Take 20 mg by mouth 2 (two) times daily. Take 1/2 tablet for fluid retention.   INSULIN ASPART (NOVOLOG) 100 UNIT/ML INJECTION    Inject 5 Units into the skin 3 (three) times daily before meals. For CBG>150   INSULIN GLARGINE (LANTUS) 100 UNIT/ML INJECTION    Inject 34 Units into the skin at bedtime.    LEVOTHYROXINE (SYNTHROID, LEVOTHROID) 75 MCG TABLET    Take 75 mcg by mouth daily. Take 1 tablet daily  for thyroid supplement.   LOSARTAN (COZAAR) 50 MG TABLET    Take 50 mg by mouth daily. Take 1 tablet daily to control blood pressure.   MEMANTINE (NAMENDA) 10 MG TABLET    Take 10 mg by mouth 2 (two) times daily. Take 1 tablet twice daily for dementia/Alzheimer's disease.   OMEGA-3 FATTY ACIDS (FISH OIL) 1200 MG CAPS    Take 1 capsule by mouth daily.   PANTOPRAZOLE (PROTONIX) 40 MG TABLET    Take 40 mg by mouth 2 (two) times daily. Take 1 tablet twice daily as needed to reduce stomach acid.   VITAMIN B-12 (CYANOCOBALAMIN) 1000 MCG TABLET    Take 1,000  mcg by mouth daily.  Modified Medications   No medications on file  Discontinued Medications   No medications on file     Physical Exam: Physical Exam  Constitutional: He is oriented to person, place, and time. He appears well-developed and well-nourished. No distress.  HENT:  Head: Normocephalic and atraumatic.  Right Ear: External ear normal.  Left Ear: External ear normal.  Nose: Nose normal.  Mouth/Throat: Oropharynx is clear and moist. No oropharyngeal exudate.  Eyes: Conjunctivae and EOM are normal. Pupils are equal, round, and reactive to light.  Neck: Normal range of motion. Neck supple. No JVD present. No thyromegaly present.  Cardiovascular: Normal rate, regular rhythm and normal heart sounds.   No murmur heard. Pulmonary/Chest: Effort normal. No respiratory distress. He has no wheezes. He has rales in the right lower field and the left lower field. He exhibits no tenderness.  Abdominal: Soft. Bowel sounds are normal. He exhibits no distension and no mass. There is no tenderness. There is no rebound.  Musculoskeletal: Normal range of motion. He exhibits edema (trace in BLE).  Lymphadenopathy:    He has no cervical adenopathy.  Neurological: He is alert and oriented to person, place, and time. He has normal reflexes. No cranial nerve deficit. Coordination normal.  Skin: Skin is warm and dry. No rash noted. He is not diaphoretic. No erythema.  Psychiatric: He has a normal mood and affect. His behavior is normal. Judgment and thought content normal.    Filed Vitals:   06/04/13 1257  BP: 145/80  Pulse: 81  Temp: 97.6 F (36.4 C)  TempSrc: Tympanic  Resp: 19  Labs reviewed: Basic Metabolic Panel:  Recent Labs  16/10/96 02/05/13 04/14/13 04/21/13  NA 133* 137 137 137  K 4.6 4.7 4.8 5.1  BUN 36* 41* 34* 39*  CREATININE 1.5* 1.6* 1.8* 2.0*  TSH 1.76  --  2.92  --    Liver Function Tests:  Recent Labs  10/23/12 04/14/13  AST 12* 10*  ALT 8* 8*  ALKPHOS  89 62    CBC:  Recent Labs  04/02/13 04/14/13 04/21/13  WBC 4.3 4.6 4.5  HGB 8.7* 9.0* 8.5*  HCT 26* 26* 39*  PLT 77* 79* 71*       Assessment/Plan Unspecified hypothyroidism Corrected with Levothyroxine , TSH 1.763 10/23/12 and 2.919 04/14/13                    Diabetes mellitus Hgb A1c 7.2 3/27/149(was 7.4 07/22/12)-6.8 02/05/13. Takes Lantus 35units, Novolog 5 units with meals.                  Depression takes Cymbalta 30mg (was reduced from 60mg  due to his falling). Staff reported the patient has increased emotional outburst--will treat UTI-may have to increase Cymbalta to 60mg  if no better in his mood. He stated he sleeps and eats well.                       Anemia Presently taking Fe, B12, Folic acid, normal Iron 114, B12 920, Folate >20.0 02/17/13, Hgb 8-9 is his new baseline, decrease Iron to 325mg  daily since 03/18/13. Hgb 8.5 04/21/13                Duodenal ulcer Stable on Protonix 40mg  bid, no c/o stomach pain, indigestion, or bloody stools.                 Essential hypertension, benign Controlled on Losartan 50mg  daily. May have to switch to BCB or CCB due to elevated creatinine.                     CHF (congestive heart failure) Well compensated on Furosemide 20mg , trace BLE edema-chronic. Will hold Lasix for now due to his V/D and elevated creatinine 1.5-2.0.                   Dementia Stable on Namenda                     Urinary tract infection, site not specified Asymptomatic, urine culture 06/04/13 E Coli 40,000c/ml, will treat with Cipro 250mg  bid for 10 days along with FloraStor since his mood is not stable and he is diabetic. Observe for stabilization of his mood.   Cellulitis and abscess of leg Right shin many open areas-warmth and redness seen-will apply Silvadene cream bid to affected areas until healed.      Family/ Staff Communication: observe the patient.  Goals of Care: SNF  Labs/tests ordered: none

## 2013-06-04 NOTE — Assessment & Plan Note (Signed)
Well compensated on Furosemide 20mg, trace BLE edema-chronic. Will hold Lasix for now due to his V/D and elevated creatinine 1.5-2.0.                

## 2013-06-04 NOTE — Assessment & Plan Note (Signed)
Controlled on Losartan 50mg daily. May have to switch to BCB or CCB due to elevated creatinine.                  

## 2013-06-04 NOTE — Assessment & Plan Note (Addendum)
Right shin many open areas-warmth and redness seen-will apply Silvadene cream bid to affected areas until healed.

## 2013-06-04 NOTE — Assessment & Plan Note (Signed)
Corrected with Levothyroxine , TSH 1.763 10/23/12 and 2.919 04/14/13

## 2013-06-29 ENCOUNTER — Non-Acute Institutional Stay (SKILLED_NURSING_FACILITY): Payer: Medicare Other | Admitting: Nurse Practitioner

## 2013-06-29 ENCOUNTER — Encounter: Payer: Self-pay | Admitting: Nurse Practitioner

## 2013-06-29 DIAGNOSIS — I10 Essential (primary) hypertension: Secondary | ICD-10-CM

## 2013-06-29 DIAGNOSIS — I509 Heart failure, unspecified: Secondary | ICD-10-CM

## 2013-06-29 DIAGNOSIS — D649 Anemia, unspecified: Secondary | ICD-10-CM

## 2013-06-29 DIAGNOSIS — F0390 Unspecified dementia without behavioral disturbance: Secondary | ICD-10-CM

## 2013-06-29 DIAGNOSIS — K269 Duodenal ulcer, unspecified as acute or chronic, without hemorrhage or perforation: Secondary | ICD-10-CM

## 2013-06-29 DIAGNOSIS — F329 Major depressive disorder, single episode, unspecified: Secondary | ICD-10-CM

## 2013-06-29 DIAGNOSIS — J209 Acute bronchitis, unspecified: Secondary | ICD-10-CM

## 2013-06-29 DIAGNOSIS — F32A Depression, unspecified: Secondary | ICD-10-CM

## 2013-06-29 DIAGNOSIS — N189 Chronic kidney disease, unspecified: Secondary | ICD-10-CM

## 2013-06-29 DIAGNOSIS — E039 Hypothyroidism, unspecified: Secondary | ICD-10-CM

## 2013-06-29 DIAGNOSIS — E119 Type 2 diabetes mellitus without complications: Secondary | ICD-10-CM

## 2013-06-29 NOTE — Assessment & Plan Note (Signed)
Hgb A1c 7.2 3/27/149(was 7.4 07/22/12)-6.8 02/05/13. Takes Lantus 35units, Novolog 5 units with meals.                           

## 2013-06-29 NOTE — Assessment & Plan Note (Signed)
Corrected with Levothyroxine , TSH 1.763 10/23/12 and 2.919 04/14/13, update TSH

## 2013-06-29 NOTE — Progress Notes (Signed)
Patient ID: [name] [name], male   DOB: October 23, 1919, 79 y.o.   MRN: [id number]  Code Status: Living Will  Allergies  Allergen Reactions  . Enalapril Other (See Comments)    cough  . Milk-Related Compounds Other (See Comments)    Doesn't remember but its a mild reaction    Chief Complaint  Patient presents with  . Medical Managment of Chronic Issues    cough, yellow phlegm  . Acute Visit    HPI: Patient is a 79 y.o. male seen in the SNF at [facility] today for evaluation of UTI, change of mood, and chronic medical conditions Problem List Items Addressed This Visit   Unspecified hypothyroidism - Primary     Corrected with Levothyroxine , TSH 1.763 10/23/12 and 2.919 04/14/13, update TSH                        UNSPECIFIED ANEMIA     Presently taking Fe, B12, Folic acid, normal Iron 114, B12 920, Folate >20.0 02/17/13, Hgb 8-9 is his new baseline, decrease Iron to 325mg  daily since 03/18/13. Hgb 8.5 04/21/13-update CBC                    Essential hypertension, benign     Controlled on Losartan 50mg  daily. May have to switch to BCB or CCB due to elevated creatinine.                         Duodenal ulcer     Stable on Protonix 40mg  bid, no c/o stomach pain, indigestion, or bloody stools.                     Diabetes mellitus (Chronic)     Hgb A1c 7.2 3/27/149(was 7.4 07/22/12)-6.8 02/05/13. Takes Lantus 35units, Novolog 5 units with meals.                      Depression     takes Cymbalta 30mg (was reduced from 60mg  due to his falling). Staff reported the patient has increased emotional outburst--will treat UTI-may have to increase Cymbalta to 60mg  if no better in his mood. He stated he sleeps and eats well.                           Dementia     Stable on Namenda                         CKD (chronic kidney disease)     Update CMP    CHF (congestive heart failure)     Well compensated on Furosemide 20mg , trace BLE edema-chronic. Will hold Lasix for now due to his V/D and elevated creatinine 1.5-2.0.                       Acute bronchitis     CXR to r/o PNA, Avelox 400mg  +FloraStor bid for 10 days, Mucinex 600mg  bid for 5 days.       Review of Systems:  Review of Systems  Constitutional: Negative for fever, chills, weight loss, malaise/fatigue and diaphoresis.  HENT: Positive for hearing loss. Negative for congestion, ear pain and sore throat.   Eyes: Negative for pain, discharge and redness.  Respiratory: Positive for cough and sputum production. Negative for shortness of breath and wheezing.  Yellow phlegm  Cardiovascular: Positive for leg swelling (trace). Negative for chest pain, palpitations, orthopnea, claudication and PND.  Gastrointestinal: Negative for heartburn, abdominal pain, diarrhea, constipation and blood in stool.  Genitourinary: Positive for frequency. Negative for dysuria, urgency, hematuria and flank pain.  Musculoskeletal: Positive for back pain and joint pain. Negative for myalgias and neck pain.  Skin: Negative for itching and rash.  Neurological: Negative for dizziness, tingling, tremors, sensory change, speech change, focal weakness, seizures, loss of consciousness, weakness and headaches.  Endo/Heme/Allergies: Negative for environmental allergies and polydipsia. Does not bruise/bleed easily.  Psychiatric/Behavioral: Positive for memory loss. Negative for depression and hallucinations. The patient is nervous/anxious. The patient does not have insomnia.      Past Medical History  Diagnosis Date  . Hypothyroidism   . Hypertension   . CHF (congestive heart failure)   . Diabetes mellitus   . Coronary artery disease   . Restless leg syndrome   . Hyperlipemia   . Renal disease     stage II  . Sleep apnea   . Cellulitis and abscess of leg   . Obesity   . Duodenal  ulcer, perforated   . Diverticulosis   . Internal hemorrhoids   . CAD (coronary artery disease)   . Anemia   . Duodenal ulcer   . Allergy   . Blood transfusion   . Cataract   . Tuberculosis     as a child  . Contact dermatitis and other eczema, due to unspecified cause 10/06/2012  . Senile dementia with depressive features 05/26/2012  . Hyposmolality and/or hyponatremia 04/02/2012  . Diarrhea 03/26/2012  . Hemorrhage of rectum and anus 03/25/2012  . Pneumonia, organism unspecified 03/12/2012  . 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 Folate-deficiency anemia 01/07/2012  . Thrombocytopenia, unspecified 09/26/2011  . Loss of weight 09/11/2011  . Dysphagia, unspecified(787.20) 06/28/2011  . Chronic kidney disease, stage III (moderate) 03/22/2011  . Cramp of limb 08/10/2010  . Chronic kidney disease, stage II (mild) 03/22/2011  . Personal history of fall 05/05/2009  . Unspecified vitamin D deficiency 02/24/2009  . Blood in stool 11/18/2008  . Dermatophytosis of the body 06/23/2007  . Obesity, unspecified 06/23/2007  . Unspecified tinnitus 06/23/2007  . Edema 06/23/2007  . Shortness of breath 06/23/2007  . Flatulence, eructation, and gas pain 06/23/2007  . Other general symptoms(780.99) 06/23/2007  . Memory loss 08/13/2003   Past Surgical History  Procedure Laterality Date  . Laparotomy  09/12/2011    Procedure: EXPLORATORY LAPAROTOMY;  Surgeon: Rulon Abide, DO;  Location: [facility];  Service: General;  Laterality: N/A;  REPAIR PERFORATED ULCER  . Gastrostomy  09/12/2011    Procedure: GASTROSTOMY;  Surgeon: Rulon Abide, DO;  Location: [facility];  Service: General;  Laterality: N/A;  BIOPSY DUODENAL ULCER  . Shoulder surgery  [place], [place]  . Jejunostomy feeding tube      removed  . Back surgery  1942  . Eye surgery  [phone]    cataracts  . Coronary artery bypass graft  2201   Social History:   reports that he has quit  smoking. His smoking use included Cigarettes. He smoked 0.00 packs per day. He has never used smokeless tobacco. He reports that he does not drink alcohol or use illicit drugs.  Family History  Problem Relation Age of Onset  . Stroke Father     Medications: Patient's Medications  New Prescriptions   No medications on file  Previous Medications   CALCIUM CARBONATE-VITAMIN D (CALCIUM 600+D) 600-400 MG-UNIT PER TABLET    Take 1 tablet by mouth daily.   CHOLECALCIFEROL (VITAMIN D) 1000 UNITS TABLET    Take 1,000 Units by mouth daily. Take 1 tablet daily for vitamin D supplement.   DULOXETINE (CYMBALTA) 30 MG CAPSULE    Take 30 mg by mouth daily.   FERROUS SULFATE 325 (65 FE) MG TABLET    Take 325 mg by mouth daily with breakfast.    FOLIC ACID (FOLVITE) 1 MG TABLET       FUROSEMIDE (LASIX) 20 MG TABLET    Take 20 mg by mouth daily. Take 1/2 tablet for fluid retention.   INSULIN ASPART (NOVOLOG) 100 UNIT/ML INJECTION    Inject 5 Units into the skin 3 (three) times daily before meals. For CBG>150   INSULIN GLARGINE (LANTUS) 100 UNIT/ML INJECTION    Inject 35 Units into the skin at bedtime.    LEVOTHYROXINE (SYNTHROID, LEVOTHROID) 75 MCG TABLET    Take 75 mcg by mouth daily. Take 1 tablet daily  for thyroid supplement.   LOSARTAN (COZAAR) 50 MG TABLET    Take 50 mg by mouth daily. Take 1 tablet daily to control blood pressure.   MEMANTINE (NAMENDA) 10 MG TABLET    Take 28 mg by mouth daily. Take 1 tablet twice daily for dementia/Alzheimer's disease.   OMEGA-3 FATTY ACIDS (FISH OIL) 1200 MG CAPS    Take 1 capsule by mouth daily.   PANTOPRAZOLE (PROTONIX) 40 MG TABLET    Take 40 mg by mouth 2 (two) times daily. Take 1 tablet twice daily as needed to reduce stomach acid.   VITAMIN B-12 (CYANOCOBALAMIN) 1000 MCG TABLET    Take 1,000 mcg by mouth daily.  Modified Medications   No medications on file  Discontinued Medications   No medications on file     Physical Exam: Physical Exam    Constitutional: He is oriented to person, place, and time. He appears well-developed and well-nourished. No distress.  HENT:  Head: Normocephalic and atraumatic.  Right Ear: External ear normal.  Left Ear: External ear normal.  Nose: Nose normal.  Mouth/Throat: Oropharynx is clear and moist. No oropharyngeal exudate.  Eyes: Conjunctivae and EOM are normal. Pupils are equal, round, and reactive to light.  Neck: Normal range of motion. Neck supple. No JVD present. No thyromegaly present.  Cardiovascular: Normal rate, regular rhythm and normal heart sounds.   No murmur heard. Pulmonary/Chest: Effort normal. No respiratory distress. He has no wheezes. He has rales in the right lower field and the left lower field. He exhibits no tenderness.  Abdominal: Soft. Bowel sounds are normal. He exhibits no distension and no mass. There is no tenderness. There is no rebound.  Musculoskeletal: Normal range of motion. He exhibits edema (trace in BLE).  Lymphadenopathy:    He has no cervical adenopathy.  Neurological: He is alert and oriented to person, place, and time. He has normal reflexes. No cranial nerve deficit. Coordination normal.  Skin: Skin is warm and dry. No rash noted. He is not diaphoretic. No erythema.  Psychiatric: He has a normal mood and affect. His behavior is normal. Judgment and thought content normal.    Filed Vitals:   06/29/13 1609  BP: 120/72  Pulse: 72  Temp: 98.9 F (37.2 C)  TempSrc: Tympanic  Resp: 20      Labs reviewed: Basic Metabolic Panel:  Recent Labs  16/10/96 02/05/13 04/14/13 04/21/13  NA 133* 137  137 137  K 4.6 4.7 4.8 5.1  BUN 36* 41* 34* 39*  CREATININE 1.5* 1.6* 1.8* 2.0*  TSH 1.76  --  2.92  --    Liver Function Tests:  Recent Labs  10/23/12 04/14/13  AST 12* 10*  ALT 8* 8*  ALKPHOS 89 62    CBC:  Recent Labs  04/02/13 04/14/13 04/21/13  WBC 4.3 4.6 4.5  HGB 8.7* 9.0* 8.5*  HCT 26* 26* 39*  PLT 77* 79* 71*        Assessment/Plan Unspecified hypothyroidism Corrected with Levothyroxine , TSH 1.763 10/23/12 and 2.919 04/14/13, update TSH                      UNSPECIFIED ANEMIA Presently taking Fe, B12, Folic acid, normal Iron 114, B12 920, Folate >20.0 02/17/13, Hgb 8-9 is his new baseline, decrease Iron to 325mg  daily since 03/18/13. Hgb 8.5 04/21/13-update CBC                  Essential hypertension, benign Controlled on Losartan 50mg  daily. May have to switch to BCB or CCB due to elevated creatinine.                       Duodenal ulcer Stable on Protonix 40mg  bid, no c/o stomach pain, indigestion, or bloody stools.                   Diabetes mellitus Hgb A1c 7.2 3/27/149(was 7.4 07/22/12)-6.8 02/05/13. Takes Lantus 35units, Novolog 5 units with meals.                    Depression takes Cymbalta 30mg (was reduced from 60mg  due to his falling). Staff reported the patient has increased emotional outburst--will treat UTI-may have to increase Cymbalta to 60mg  if no better in his mood. He stated he sleeps and eats well.                         Dementia Stable on Namenda                       CKD (chronic kidney disease) Update CMP  CHF (congestive heart failure) Well compensated on Furosemide 20mg , trace BLE edema-chronic. Will hold Lasix for now due to his V/D and elevated creatinine 1.5-2.0.                     Acute bronchitis CXR to r/o PNA, Avelox 400mg  +FloraStor bid for 10 days, Mucinex 600mg  bid for 5 days.    Family/ Staff Communication: observe the patient.  Goals of Care: SNF  Labs/tests ordered: CXR, CBC, CMP, TSH

## 2013-06-29 NOTE — Assessment & Plan Note (Signed)
Presently taking Fe, B12, Folic acid, normal Iron 114, B12 920, Folate >20.0 02/17/13, Hgb 8-9 is his new baseline, decrease Iron to 325mg  daily since 03/18/13. Hgb 8.5 04/21/13-update CBC

## 2013-06-29 NOTE — Assessment & Plan Note (Signed)
takes Cymbalta 30mg (was reduced from 60mg  due to his falling). Staff reported the patient has increased emotional outburst--will treat UTI-may have to increase Cymbalta to 60mg  if no better in his mood. He stated he sleeps and eats well.

## 2013-06-29 NOTE — Assessment & Plan Note (Signed)
Update CMP.  

## 2013-06-29 NOTE — Assessment & Plan Note (Signed)
CXR to r/o PNA, Avelox 400mg  +FloraStor bid for 10 days, Mucinex 600mg  bid for 5 days.

## 2013-06-29 NOTE — Assessment & Plan Note (Signed)
Well compensated on Furosemide 20mg, trace BLE edema-chronic. Will hold Lasix for now due to his V/D and elevated creatinine 1.5-2.0.                

## 2013-06-29 NOTE — Assessment & Plan Note (Signed)
Stable on Protonix 40mg bid, no c/o stomach pain, indigestion, or bloody stools.                    

## 2013-06-29 NOTE — Assessment & Plan Note (Signed)
Controlled on Losartan 50mg daily. May have to switch to BCB or CCB due to elevated creatinine.                  

## 2013-06-29 NOTE — Assessment & Plan Note (Signed)
Stable on Namenda    

## 2013-06-30 LAB — HEPATIC FUNCTION PANEL: AST: 9 U/L — AB (ref 14–40)

## 2013-06-30 LAB — CBC AND DIFFERENTIAL: Hemoglobin: 8.4 g/dL — AB (ref 13.5–17.5)

## 2013-06-30 LAB — BASIC METABOLIC PANEL
Glucose: 93 mg/dL
Potassium: 5.3 mmol/L (ref 3.4–5.3)
Sodium: 142 mmol/L (ref 137–147)

## 2013-07-01 ENCOUNTER — Encounter: Payer: Self-pay | Admitting: Nurse Practitioner

## 2013-07-01 ENCOUNTER — Non-Acute Institutional Stay (SKILLED_NURSING_FACILITY): Payer: Medicare Other | Admitting: Nurse Practitioner

## 2013-07-01 DIAGNOSIS — F329 Major depressive disorder, single episode, unspecified: Secondary | ICD-10-CM

## 2013-07-01 DIAGNOSIS — E039 Hypothyroidism, unspecified: Secondary | ICD-10-CM

## 2013-07-01 DIAGNOSIS — F3289 Other specified depressive episodes: Secondary | ICD-10-CM

## 2013-07-01 DIAGNOSIS — J209 Acute bronchitis, unspecified: Secondary | ICD-10-CM

## 2013-07-01 DIAGNOSIS — N189 Chronic kidney disease, unspecified: Secondary | ICD-10-CM

## 2013-07-01 DIAGNOSIS — I10 Essential (primary) hypertension: Secondary | ICD-10-CM

## 2013-07-01 DIAGNOSIS — I509 Heart failure, unspecified: Secondary | ICD-10-CM

## 2013-07-01 DIAGNOSIS — D649 Anemia, unspecified: Secondary | ICD-10-CM

## 2013-07-01 DIAGNOSIS — E119 Type 2 diabetes mellitus without complications: Secondary | ICD-10-CM

## 2013-07-01 NOTE — Assessment & Plan Note (Signed)
Presently taking Fe, off B12, Folic acid, normal Iron 114, B12 920, Folate >20.0 02/17/13, Hgb 8-9 is his new baseline, decrease Iron to 325mg  daily since 03/18/13. Hgb 8.5 04/21/13-8.4 MCV 112.3, MCH 38.2 06/30/13. Will repeat CBC and anemia panel.

## 2013-07-01 NOTE — Progress Notes (Signed)
Patient ID: [name] [name], male   DOB: 03-13-1920, 79 y.o.   MRN: [id number]   Code Status: DNR  Allergies  Allergen Reactions  . Enalapril Other (See Comments)    cough  . Milk-Related Compounds Other (See Comments)    Doesn't remember but its a mild reaction    Chief Complaint  Patient presents with  . Medical Managment of Chronic Issues    anemia, acute bronchitis, CKD  . Acute Visit    HPI: Patient is a 79 y.o. male seen in the SNF at [facility] today for evaluation of anemia, acute bronchitis, blood pressure, CKD,  and other chronic medical conditions.  Problem List Items Addressed This Visit   Unspecified hypothyroidism     Corrected with Levothyroxine , TSH 1.894 06/30/13                          Relevant Medications      metoprolol tartrate (LOPRESSOR) 25 MG tablet   UNSPECIFIED ANEMIA - Primary     Presently taking Fe, off B12, Folic acid, normal Iron 114, B12 920, Folate >20.0 02/17/13, Hgb 8-9 is his new baseline, decrease Iron to 325mg  daily since 03/18/13. Hgb 8.5 04/21/13-8.4 MCV 112.3, MCH 38.2 06/30/13. Will repeat CBC and anemia panel.                     Essential hypertension, benign     Controlled on Losartan 50mg  daily. May have to switch to BCB or CCB due to elevated creatinine. K 5.3 Bun 46, creat 1.97 06/30/13--will dc Losartan, starts Metoprolol 25mg  bid--monitor Bp+P 2x/day.                           Diabetes mellitus (Chronic)     Hgb A1c 7.2 3/27/149(was 7.4 07/22/12)-6.8 02/05/13. Takes Lantus 35units, Novolog 5 units with meals.                        Depression     takes Cymbalta 30mg (was reduced from 60mg  due to his falling). Staff reported the patient has increased emotional outburst--will treat UTI-may have to increase Cymbalta to 60mg  if no better in his mood. He stated he sleeps and eats well.                             CKD  (chronic kidney disease)     Persisted with Bun 46, creatinine 1.97, K 5.3--will dc Losartan and try Metoprolol-monitor renal function.       CHF (congestive heart failure)     Well compensated on Furosemide 20mg , trace BLE edema-chronic. CXR 06/29/13 showed no pulmonary vascular congestion.                         Acute bronchitis     CXR 06/29/13 showed there is mild patchy atelectasis or interstitial pneumonitis at the right lung base appearing significantly improved. complete Avelox 400mg  +FloraStor bid for 10 days, Mucinex 600mg  bid for 5 days started 06/29/13.          Review of Systems:  Review of Systems  Constitutional: Negative for fever, chills, weight loss, malaise/fatigue and diaphoresis.  HENT: Positive for hearing loss. Negative for congestion, ear pain and sore throat.   Eyes: Negative for pain, discharge and redness.  Respiratory: Positive for  cough and sputum production. Negative for shortness of breath and wheezing.        Yellow phlegm  Cardiovascular: Positive for leg swelling (trace). Negative for chest pain, palpitations, orthopnea, claudication and PND.  Gastrointestinal: Negative for heartburn, abdominal pain, diarrhea, constipation and blood in stool.  Genitourinary: Positive for frequency. Negative for dysuria, urgency, hematuria and flank pain.  Musculoskeletal: Positive for back pain and joint pain. Negative for myalgias and neck pain.  Skin: Negative for itching and rash.  Neurological: Negative for dizziness, tingling, tremors, sensory change, speech change, focal weakness, seizures, loss of consciousness, weakness and headaches.  Endo/Heme/Allergies: Negative for environmental allergies and polydipsia. Does not bruise/bleed easily.  Psychiatric/Behavioral: Positive for memory loss. Negative for depression and hallucinations. The patient is nervous/anxious. The patient does not have insomnia.      Past Medical History  Diagnosis Date    . Hypothyroidism   . Hypertension   . CHF (congestive heart failure)   . Diabetes mellitus   . Coronary artery disease   . Restless leg syndrome   . Hyperlipemia   . Renal disease     stage II  . Sleep apnea   . Cellulitis and abscess of leg   . Obesity   . Duodenal ulcer, perforated   . Diverticulosis   . Internal hemorrhoids   . CAD (coronary artery disease)   . Anemia   . Duodenal ulcer   . Allergy   . Blood transfusion   . Cataract   . Tuberculosis     as a child  . Contact dermatitis and other eczema, due to unspecified cause 10/06/2012  . Senile dementia with depressive features 05/26/2012  . Hyposmolality and/or hyponatremia 04/02/2012  . Diarrhea 03/26/2012  . Hemorrhage of rectum and anus 03/25/2012  . Pneumonia, organism unspecified 03/12/2012  . 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 Folate-deficiency anemia 01/07/2012  . Thrombocytopenia, unspecified 09/26/2011  . Loss of weight 09/11/2011  . Dysphagia, unspecified(787.20) 06/28/2011  . Chronic kidney disease, stage III (moderate) 03/22/2011  . Cramp of limb 08/10/2010  . Chronic kidney disease, stage II (mild) 03/22/2011  . Personal history of fall 05/05/2009  . Unspecified vitamin D deficiency 02/24/2009  . Blood in stool 11/18/2008  . Dermatophytosis of the body 06/23/2007  . Obesity, unspecified 06/23/2007  . Unspecified tinnitus 06/23/2007  . Edema 06/23/2007  . Shortness of breath 06/23/2007  . Flatulence, eructation, and gas pain 06/23/2007  . Other general symptoms(780.99) 06/23/2007  . Memory loss 08/13/2003   Past Surgical History  Procedure Laterality Date  . Laparotomy  09/12/2011    Procedure: EXPLORATORY LAPAROTOMY;  Surgeon: Rulon Abide, DO;  Location: [facility];  Service: General;  Laterality: N/A;  REPAIR PERFORATED ULCER  . Gastrostomy  09/12/2011    Procedure: GASTROSTOMY;  Surgeon: Rulon Abide, DO;  Location: [facility];  Service: General;   Laterality: N/A;  BIOPSY DUODENAL ULCER  . Shoulder surgery  [place], [place]  . Jejunostomy feeding tube      removed  . Back surgery  1942  . Eye surgery  [phone]    cataracts  . Coronary artery bypass graft  2201   Social History:   reports that he has quit smoking. His smoking use included Cigarettes. He smoked 0.00 packs per day. He has never used smokeless tobacco. He reports that he does not drink alcohol or use illicit drugs.  Family History  Problem Relation Age of Onset  .  Stroke Father     Medications: Patient's Medications  New Prescriptions   No medications on file  Previous Medications   CALCIUM CARBONATE-VITAMIN D (CALCIUM 600+D) 600-400 MG-UNIT PER TABLET    Take 1 tablet by mouth daily.   CHOLECALCIFEROL (VITAMIN D) 1000 UNITS TABLET    Take 1,000 Units by mouth daily. Take 1 tablet daily for vitamin D supplement.   DULOXETINE (CYMBALTA) 30 MG CAPSULE    Take 30 mg by mouth daily.   FERROUS SULFATE 325 (65 FE) MG TABLET    Take 325 mg by mouth daily with breakfast.    FOLIC ACID (FOLVITE) 1 MG TABLET       FUROSEMIDE (LASIX) 20 MG TABLET    Take 20 mg by mouth daily. Take 1/2 tablet for fluid retention.   INSULIN ASPART (NOVOLOG) 100 UNIT/ML INJECTION    Inject 5 Units into the skin 3 (three) times daily before meals. For CBG>150   INSULIN GLARGINE (LANTUS) 100 UNIT/ML INJECTION    Inject 35 Units into the skin at bedtime.    LEVOTHYROXINE (SYNTHROID, LEVOTHROID) 75 MCG TABLET    Take 75 mcg by mouth daily. Take 1 tablet daily  for thyroid supplement.   MEMANTINE (NAMENDA) 10 MG TABLET    Take 28 mg by mouth daily. Take 1 tablet twice daily for dementia/Alzheimer's disease.   METOPROLOL TARTRATE (LOPRESSOR) 25 MG TABLET    Take 25 mg by mouth 2 (two) times daily.   OMEGA-3 FATTY ACIDS (FISH OIL) 1200 MG CAPS    Take 1 capsule by mouth daily.   PANTOPRAZOLE (PROTONIX) 40 MG TABLET    Take 40 mg by mouth 2 (two) times daily. Take 1 tablet twice daily as  needed to reduce stomach acid.   VITAMIN B-12 (CYANOCOBALAMIN) 1000 MCG TABLET    Take 1,000 mcg by mouth daily.  Modified Medications   No medications on file  Discontinued Medications   LOSARTAN (COZAAR) 50 MG TABLET    Take 50 mg by mouth daily. Take 1 tablet daily to control blood pressure.     Physical Exam: Physical Exam  Constitutional: He is oriented to person, place, and time. He appears well-developed and well-nourished. No distress.  HENT:  Head: Normocephalic and atraumatic.  Right Ear: External ear normal.  Left Ear: External ear normal.  Nose: Nose normal.  Mouth/Throat: Oropharynx is clear and moist. No oropharyngeal exudate.  Eyes: Conjunctivae and EOM are normal. Pupils are equal, round, and reactive to light.  Neck: Normal range of motion. Neck supple. No JVD present. No thyromegaly present.  Cardiovascular: Normal rate, regular rhythm and normal heart sounds.   No murmur heard. Pulmonary/Chest: Effort normal. No respiratory distress. He has no wheezes. He has rales in the right lower field and the left lower field. He exhibits no tenderness.  Abdominal: Soft. Bowel sounds are normal. He exhibits no distension and no mass. There is no tenderness. There is no rebound.  Musculoskeletal: Normal range of motion. He exhibits edema (trace in BLE).  Lymphadenopathy:    He has no cervical adenopathy.  Neurological: He is alert and oriented to person, place, and time. He has normal reflexes. No cranial nerve deficit. Coordination normal.  Skin: Skin is warm and dry. No rash noted. He is not diaphoretic. No erythema.  Psychiatric: He has a normal mood and affect. His behavior is normal. Judgment and thought content normal.    Filed Vitals:   07/01/13 1136  BP: 128/68  Pulse: 82  Temp:  98.8 F (37.1 C)  TempSrc: Tympanic  Resp: 18      Labs reviewed: Basic Metabolic Panel:  Recent Labs  09/81/19  04/14/13 04/21/13 06/30/13  NA 133*  < > 137 137 142  K 4.6  < >  4.8 5.1 5.3  BUN 36*  < > 34* 39* 46*  CREATININE 1.5*  < > 1.8* 2.0* 2.0*  TSH 1.76  --  2.92  --  1.89  < > = values in this interval not displayed. Liver Function Tests:  Recent Labs  10/23/12 04/14/13 06/30/13  AST 12* 10* 9*  ALT 8* 8* 8*  ALKPHOS 89 62 66   CBC:  Recent Labs  04/14/13 04/21/13 06/30/13  WBC 4.6 4.5 4.4  HGB 9.0* 8.5* 8.4*  HCT 26* 39* 25*  PLT 79* 71* 83*   Past Procedures:  CXR 06/29/13 showed there is mild patchy atelectasis or interstitial pneumonitis at the right lung base appearing significantly improved.   Assessment/Plan Unspecified hypothyroidism Corrected with Levothyroxine , TSH 1.894 06/30/13                        UNSPECIFIED ANEMIA Presently taking Fe, off B12, Folic acid, normal Iron 114, B12 920, Folate >20.0 02/17/13, Hgb 8-9 is his new baseline, decrease Iron to 325mg  daily since 03/18/13. Hgb 8.5 04/21/13-8.4 MCV 112.3, MCH 38.2 06/30/13. Will repeat CBC and anemia panel.                   Essential hypertension, benign Controlled on Losartan 50mg  daily. May have to switch to BCB or CCB due to elevated creatinine. K 5.3 Bun 46, creat 1.97 06/30/13--will dc Losartan, starts Metoprolol 25mg  bid--monitor Bp+P 2x/day.                         Diabetes mellitus Hgb A1c 7.2 3/27/149(was 7.4 07/22/12)-6.8 02/05/13. Takes Lantus 35units, Novolog 5 units with meals.                      Depression takes Cymbalta 30mg (was reduced from 60mg  due to his falling). Staff reported the patient has increased emotional outburst--will treat UTI-may have to increase Cymbalta to 60mg  if no better in his mood. He stated he sleeps and eats well.                           CKD (chronic kidney disease) Persisted with Bun 46, creatinine 1.97, K 5.3--will dc Losartan and try Metoprolol-monitor renal function.     CHF (congestive heart failure) Well  compensated on Furosemide 20mg , trace BLE edema-chronic. CXR 06/29/13 showed no pulmonary vascular congestion.                       Acute bronchitis CXR 06/29/13 showed there is mild patchy atelectasis or interstitial pneumonitis at the right lung base appearing significantly improved. complete Avelox 400mg  +FloraStor bid for 10 days, Mucinex 600mg  bid for 5 days started 06/29/13.       Family/ Staff Communication: observe the patient  Goals of Care: SNF  Labs/tests ordered: CBC and anemia panel.

## 2013-07-01 NOTE — Assessment & Plan Note (Signed)
Well compensated on Furosemide 20mg, trace BLE edema-chronic. CXR 06/29/13 showed no pulmonary vascular congestion.                            

## 2013-07-01 NOTE — Assessment & Plan Note (Signed)
takes Cymbalta 30mg(was reduced from 60mg due to his falling). Staff reported the patient has increased emotional outburst--will treat UTI-may have to increase Cymbalta to 60mg if no better in his mood. He stated he sleeps and eats well.                        

## 2013-07-01 NOTE — Assessment & Plan Note (Signed)
Corrected with Levothyroxine 75mcg, TSH 1.894 06/30/13                           

## 2013-07-01 NOTE — Assessment & Plan Note (Signed)
Persisted with Bun 46, creatinine 1.97, K 5.3--will dc Losartan and try Metoprolol-monitor renal function.

## 2013-07-01 NOTE — Assessment & Plan Note (Signed)
Controlled on Losartan 50mg  daily. May have to switch to BCB or CCB due to elevated creatinine. K 5.3 Bun 46, creat 1.97 06/30/13--will dc Losartan, starts Metoprolol 25mg  bid--monitor Bp+P 2x/day.

## 2013-07-01 NOTE — Assessment & Plan Note (Signed)
CXR 06/29/13 showed there is mild patchy atelectasis or interstitial pneumonitis at the right lung base appearing significantly improved. complete Avelox 400mg  +FloraStor bid for 10 days, Mucinex 600mg  bid for 5 days started 06/29/13.

## 2013-07-01 NOTE — Assessment & Plan Note (Signed)
Hgb A1c 7.2 3/27/149(was 7.4 07/22/12)-6.8 02/05/13. Takes Lantus 35units, Novolog 5 units with meals.                           

## 2013-07-02 LAB — CBC AND DIFFERENTIAL
HCT: 24 % — AB (ref 41–53)
Hemoglobin: 8.3 g/dL — AB (ref 13.5–17.5)
Platelets: 77 10*3/uL — AB (ref 150–399)
WBC: 3.7 10^3/mL

## 2013-07-15 ENCOUNTER — Non-Acute Institutional Stay (SKILLED_NURSING_FACILITY): Payer: Medicare Other | Admitting: Nurse Practitioner

## 2013-07-15 ENCOUNTER — Encounter: Payer: Self-pay | Admitting: Nurse Practitioner

## 2013-07-15 DIAGNOSIS — I509 Heart failure, unspecified: Secondary | ICD-10-CM

## 2013-07-15 DIAGNOSIS — R05 Cough: Secondary | ICD-10-CM

## 2013-07-15 DIAGNOSIS — D649 Anemia, unspecified: Secondary | ICD-10-CM

## 2013-07-15 DIAGNOSIS — I10 Essential (primary) hypertension: Secondary | ICD-10-CM

## 2013-07-15 DIAGNOSIS — J209 Acute bronchitis, unspecified: Secondary | ICD-10-CM

## 2013-07-15 DIAGNOSIS — E119 Type 2 diabetes mellitus without complications: Secondary | ICD-10-CM

## 2013-07-15 DIAGNOSIS — F0390 Unspecified dementia without behavioral disturbance: Secondary | ICD-10-CM

## 2013-07-15 DIAGNOSIS — F329 Major depressive disorder, single episode, unspecified: Secondary | ICD-10-CM

## 2013-07-15 DIAGNOSIS — N189 Chronic kidney disease, unspecified: Secondary | ICD-10-CM

## 2013-07-15 DIAGNOSIS — E039 Hypothyroidism, unspecified: Secondary | ICD-10-CM

## 2013-07-15 NOTE — Assessment & Plan Note (Signed)
Persisted, Hgb 8.3 07/02/13. Normal Iron 87, B12 740, Folate >20. Will dc Fe. Update CBC in one month.    

## 2013-07-15 NOTE — Assessment & Plan Note (Signed)
Hgb A1c 7.2 3/27/149(was 7.4 07/22/12)-6.8 02/05/13. Takes Lantus 35units, Novolog 5 units with meals.                           

## 2013-07-15 NOTE — Assessment & Plan Note (Signed)
Persisted with Bun 46, creatinine 1.97, K 5.3--off Losartan and try Metoprolol-monitor renal function-update BMP in one month       

## 2013-07-15 NOTE — Assessment & Plan Note (Signed)
After acute bronchitis vs dysphagia: ST to eval and tx. Medrol dose pk, Neb q6h x 1wk, Mucinex bid x1 wk. Observe.

## 2013-07-15 NOTE — Assessment & Plan Note (Signed)
takes Cymbalta 30mg(was reduced from 60mg due to his falling). Stable.                              

## 2013-07-15 NOTE — Progress Notes (Signed)
Patient ID: [name] [name], male   DOB: [birth year], 79 y.o.   MRN: [id number]   Code Status: DNR  Allergies  Allergen Reactions  . Enalapril Other (See Comments)    cough  . Milk-Related Compounds Other (See Comments)    Doesn't remember but its a mild reaction    Chief Complaint  Patient presents with  . Medical Managment of Chronic Issues    persisted cough  . Acute Visit    HPI: Patient is a 79 y.o. male seen in the SNF at [facility] today for evaluation of anemia, treated acute bronchitis, blood pressure, persisted cough,  CKD,  and other chronic medical conditions.  Problem List Items Addressed This Visit   Diabetes mellitus (Chronic)     Hgb A1c 7.2 3/27/149(was 7.4 07/22/12)-6.8 02/05/13. Takes Lantus 35units, Novolog 5 units with meals.                          CHF (congestive heart failure)     Well compensated on Furosemide 20mg , trace BLE edema-chronic. CXR 06/29/13 showed no pulmonary vascular congestion.                           CKD (chronic kidney disease)     Persisted with Bun 46, creatinine 1.97, K 5.3--off Losartan and try Metoprolol-monitor renal function-update BMP in one month        Depression     takes Cymbalta 30mg (was reduced from 60mg  due to his falling). Stable.                               Dementia     Stable on Namenda                           Essential hypertension, benign     Controlled on Metoprolol 25mg  bid. Update BMP in one month                            Unspecified hypothyroidism     Corrected with Levothyroxine , TSH 1.894 06/30/13                            Anemia     Persisted, Hgb 8.3 07/02/13. Normal Iron 87, B12 740, Folate >20. Will dc Fe. Update CBC in one month.     Acute bronchitis - Primary     CXR 06/29/13 showed there is mild patchy atelectasis or interstitial  pneumonitis at the right lung base appearing significantly improved. completed Avelox 400mg  +FloraStor bid for 10 days, Mucinex 600mg  bid for 5 days started 06/29/13. Resolved except persisted chronic cough.        Cough     After acute bronchitis vs dysphagia: ST to eval and tx. Medrol dose pk, Neb q6h x 1wk, Mucinex bid x1 wk. Observe.        Review of Systems:  Review of Systems  Constitutional: Negative for fever, chills, weight loss, malaise/fatigue and diaphoresis.  HENT: Positive for hearing loss. Negative for congestion, ear pain and sore throat.   Eyes: Negative for pain, discharge and redness.  Respiratory: Positive for cough and sputum production. Negative for shortness of breath and wheezing.        Clear  sputum. Associated with swallowing at times. Bed side swallow test negative  Cardiovascular: Positive for leg swelling (trace). Negative for chest pain, palpitations, orthopnea, claudication and PND.  Gastrointestinal: Negative for heartburn, abdominal pain, diarrhea, constipation and blood in stool.  Genitourinary: Positive for frequency. Negative for dysuria, urgency, hematuria and flank pain.  Musculoskeletal: Positive for back pain and joint pain. Negative for myalgias and neck pain.  Skin: Negative for itching and rash.  Neurological: Negative for dizziness, tingling, tremors, sensory change, speech change, focal weakness, seizures, loss of consciousness, weakness and headaches.  Endo/Heme/Allergies: Negative for environmental allergies and polydipsia. Does not bruise/bleed easily.  Psychiatric/Behavioral: Positive for memory loss. Negative for depression and hallucinations. The patient is nervous/anxious. The patient does not have insomnia.      Past Medical History  Diagnosis Date  . Hypothyroidism   . Hypertension   . CHF (congestive heart failure)   . Diabetes mellitus   . Coronary artery disease   . Restless leg syndrome   . Hyperlipemia   . Renal disease      stage II  . Sleep apnea   . Cellulitis and abscess of leg   . Obesity   . Duodenal ulcer, perforated   . Diverticulosis   . Internal hemorrhoids   . CAD (coronary artery disease)   . Anemia   . Duodenal ulcer   . Allergy   . Blood transfusion   . Cataract   . Tuberculosis     as a child  . Contact dermatitis and other eczema, due to unspecified cause 10/06/2012  . Senile dementia with depressive features 05/26/2012  . Hyposmolality and/or hyponatremia 04/02/2012  . Diarrhea 03/26/2012  . Hemorrhage of rectum and anus 03/25/2012  . Pneumonia, organism unspecified 03/12/2012  . 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 Folate-deficiency anemia 01/07/2012  . Thrombocytopenia, unspecified 09/26/2011  . Loss of weight 09/11/2011  . Dysphagia, unspecified(787.20) 06/28/2011  . Chronic kidney disease, stage III (moderate) 03/22/2011  . Cramp of limb 08/10/2010  . Chronic kidney disease, stage II (mild) 03/22/2011  . Personal history of fall 05/05/2009  . Unspecified vitamin D deficiency 02/24/2009  . Blood in stool 11/18/2008  . Dermatophytosis of the body 06/23/2007  . Obesity, unspecified 06/23/2007  . Unspecified tinnitus 06/23/2007  . Edema 06/23/2007  . Shortness of breath 06/23/2007  . Flatulence, eructation, and gas pain 06/23/2007  . Other general symptoms(780.99) 06/23/2007  . Memory loss 08/13/2003   Past Surgical History  Procedure Laterality Date  . Laparotomy  09/12/2011    Procedure: EXPLORATORY LAPAROTOMY;  Surgeon: Rulon Abide, DO;  Location: [facility];  Service: General;  Laterality: N/A;  REPAIR PERFORATED ULCER  . Gastrostomy  09/12/2011    Procedure: GASTROSTOMY;  Surgeon: Rulon Abide, DO;  Location: [facility];  Service: General;  Laterality: N/A;  BIOPSY DUODENAL ULCER  . Shoulder surgery  [place], [place]  . Jejunostomy feeding tube      removed  . Back surgery  1942  . Eye surgery  [phone]    cataracts  .  Coronary artery bypass graft  2201   Social History:   reports that he has quit smoking. His smoking use included Cigarettes. He smoked 0.00 packs per day. He has never used smokeless tobacco. He reports that he does not drink alcohol or use illicit drugs.  Family History  Problem Relation Age of Onset  . Stroke Father     Medications: Patient's Medications  New Prescriptions   No medications on file  Previous Medications   CALCIUM CARBONATE-VITAMIN D (CALCIUM 600+D) 600-400 MG-UNIT PER TABLET    Take 1 tablet by mouth daily.   CHOLECALCIFEROL (VITAMIN D) 1000 UNITS TABLET    Take 1,000 Units by mouth daily. Take 1 tablet daily for vitamin D supplement.   DULOXETINE (CYMBALTA) 30 MG CAPSULE    Take 30 mg by mouth daily.   FOLIC ACID (FOLVITE) 1 MG TABLET       FUROSEMIDE (LASIX) 20 MG TABLET    Take 20 mg by mouth daily.    INSULIN ASPART (NOVOLOG) 100 UNIT/ML INJECTION    Inject 5 Units into the skin 3 (three) times daily before meals. For CBG>150   INSULIN GLARGINE (LANTUS) 100 UNIT/ML INJECTION    Inject 35 Units into the skin at bedtime.    LEVOTHYROXINE (SYNTHROID, LEVOTHROID) 75 MCG TABLET    Take 75 mcg by mouth daily. Take 1 tablet daily  for thyroid supplement.   MEMANTINE (NAMENDA) 10 MG TABLET    Take 28 mg by mouth daily. Take 1 tablet twice daily for dementia/Alzheimer's disease.   METOPROLOL TARTRATE (LOPRESSOR) 25 MG TABLET    Take 25 mg by mouth 2 (two) times daily.   OMEGA-3 FATTY ACIDS (FISH OIL) 1200 MG CAPS    Take 1 capsule by mouth daily.   PANTOPRAZOLE (PROTONIX) 40 MG TABLET    Take 40 mg by mouth 2 (two) times daily. Take 1 tablet twice daily as needed to reduce stomach acid.   VITAMIN B-12 (CYANOCOBALAMIN) 1000 MCG TABLET    Take 1,000 mcg by mouth daily.  Modified Medications   No medications on file  Discontinued Medications   FERROUS SULFATE 325 (65 FE) MG TABLET    Take 325 mg by mouth daily with breakfast.      Physical Exam: Physical Exam    Constitutional: He is oriented to person, place, and time. He appears well-developed and well-nourished. No distress.  HENT:  Head: Normocephalic and atraumatic.  Right Ear: External ear normal.  Left Ear: External ear normal.  Nose: Nose normal.  Mouth/Throat: Oropharynx is clear and moist. No oropharyngeal exudate.  Eyes: Conjunctivae and EOM are normal. Pupils are equal, round, and reactive to light.  Neck: Normal range of motion. Neck supple. No JVD present. No thyromegaly present.  Cardiovascular: Normal rate, regular rhythm and normal heart sounds.   No murmur heard. Pulmonary/Chest: Effort normal. No respiratory distress. He has no wheezes. He has rales in the right lower field and the left lower field. He exhibits no tenderness.  Bibasilar as prior.   Abdominal: Soft. Bowel sounds are normal. He exhibits no distension and no mass. There is no tenderness. There is no rebound.  Musculoskeletal: Normal range of motion. He exhibits edema (trace in BLE).  Lymphadenopathy:    He has no cervical adenopathy.  Neurological: He is alert and oriented to person, place, and time. He has normal reflexes. No cranial nerve deficit. Coordination normal.  Skin: Skin is warm and dry. No rash noted. He is not diaphoretic. No erythema.  Psychiatric: He has a normal mood and affect. His behavior is normal. Judgment and thought content normal.    Filed Vitals:   07/15/13 1018  BP: 118/52  Pulse: 68  Temp: 97.8 F (36.6 C)  TempSrc: Tympanic  Resp: 18      Labs reviewed: Basic Metabolic Panel:  Recent Labs  16/10/96  04/14/13 04/21/13 06/30/13  NA 133*  < >  137 137 142  K 4.6  < > 4.8 5.1 5.3  BUN 36*  < > 34* 39* 46*  CREATININE 1.5*  < > 1.8* 2.0* 2.0*  TSH 1.76  --  2.92  --  1.89  < > = values in this interval not displayed. Liver Function Tests:  Recent Labs  10/23/12 04/14/13 06/30/13  AST 12* 10* 9*  ALT 8* 8* 8*  ALKPHOS 89 62 66   CBC:  Recent Labs  04/21/13  06/30/13 07/02/13  WBC 4.5 4.4 3.7  HGB 8.5* 8.4* 8.3*  HCT 39* 25* 24*  PLT 71* 83* 77*   Past Procedures:  CXR 06/29/13 showed there is mild patchy atelectasis or interstitial pneumonitis at the right lung base appearing significantly improved.   Assessment/Plan Acute bronchitis CXR 06/29/13 showed there is mild patchy atelectasis or interstitial pneumonitis at the right lung base appearing significantly improved. completed Avelox 400mg  +FloraStor bid for 10 days, Mucinex 600mg  bid for 5 days started 06/29/13. Resolved except persisted chronic cough.      Anemia Persisted, Hgb 8.3 07/02/13. Normal Iron 87, B12 740, Folate >20. Will dc Fe. Update CBC in one month.   Unspecified hypothyroidism Corrected with Levothyroxine , TSH 1.894 06/30/13                          Essential hypertension, benign Controlled on Metoprolol 25mg  bid. Update BMP in one month                          Dementia Stable on Namenda                         Depression takes Cymbalta 30mg (was reduced from 60mg  due to his falling). Stable.                             CKD (chronic kidney disease) Persisted with Bun 46, creatinine 1.97, K 5.3--off Losartan and try Metoprolol-monitor renal function-update BMP in one month      CHF (congestive heart failure) Well compensated on Furosemide 20mg , trace BLE edema-chronic. CXR 06/29/13 showed no pulmonary vascular congestion.                         Diabetes mellitus Hgb A1c 7.2 3/27/149(was 7.4 07/22/12)-6.8 02/05/13. Takes Lantus 35units, Novolog 5 units with meals.                        Cough After acute bronchitis vs dysphagia: ST to eval and tx. Medrol dose pk, Neb q6h x 1wk, Mucinex bid x1 wk. Observe.     Family/ Staff Communication: observe the patient  Goals of Care: SNF  Labs/tests ordered: CBC and BMP  in one month.

## 2013-07-15 NOTE — Assessment & Plan Note (Signed)
CXR 06/29/13 showed there is mild patchy atelectasis or interstitial pneumonitis at the right lung base appearing significantly improved. completed Avelox 400mg  +FloraStor bid for 10 days, Mucinex 600mg  bid for 5 days started 06/29/13. Resolved except persisted chronic cough.

## 2013-07-15 NOTE — Assessment & Plan Note (Signed)
Controlled on Metoprolol 25mg bid. Update BMP in one month                           

## 2013-07-15 NOTE — Assessment & Plan Note (Signed)
Well compensated on Furosemide 20mg, trace BLE edema-chronic. CXR 06/29/13 showed no pulmonary vascular congestion.                            

## 2013-07-15 NOTE — Assessment & Plan Note (Signed)
Corrected with Levothyroxine 75mcg, TSH 1.894 06/30/13                           

## 2013-07-15 NOTE — Assessment & Plan Note (Signed)
Stable on Namenda    

## 2013-08-05 ENCOUNTER — Non-Acute Institutional Stay (SKILLED_NURSING_FACILITY): Payer: Medicare Other | Admitting: Nurse Practitioner

## 2013-08-05 ENCOUNTER — Encounter: Payer: Self-pay | Admitting: Nurse Practitioner

## 2013-08-05 DIAGNOSIS — F3289 Other specified depressive episodes: Secondary | ICD-10-CM

## 2013-08-05 DIAGNOSIS — K2970 Gastritis, unspecified, without bleeding: Secondary | ICD-10-CM

## 2013-08-05 DIAGNOSIS — F0390 Unspecified dementia without behavioral disturbance: Secondary | ICD-10-CM

## 2013-08-05 DIAGNOSIS — R059 Cough, unspecified: Secondary | ICD-10-CM

## 2013-08-05 DIAGNOSIS — F32A Depression, unspecified: Secondary | ICD-10-CM

## 2013-08-05 DIAGNOSIS — E039 Hypothyroidism, unspecified: Secondary | ICD-10-CM

## 2013-08-05 DIAGNOSIS — F329 Major depressive disorder, single episode, unspecified: Secondary | ICD-10-CM

## 2013-08-05 DIAGNOSIS — D649 Anemia, unspecified: Secondary | ICD-10-CM

## 2013-08-05 DIAGNOSIS — N189 Chronic kidney disease, unspecified: Secondary | ICD-10-CM

## 2013-08-05 DIAGNOSIS — E119 Type 2 diabetes mellitus without complications: Secondary | ICD-10-CM

## 2013-08-05 DIAGNOSIS — K2990 Gastroduodenitis, unspecified, without bleeding: Secondary | ICD-10-CM

## 2013-08-05 DIAGNOSIS — R05 Cough: Secondary | ICD-10-CM

## 2013-08-05 DIAGNOSIS — I509 Heart failure, unspecified: Secondary | ICD-10-CM

## 2013-08-05 DIAGNOSIS — I10 Essential (primary) hypertension: Secondary | ICD-10-CM

## 2013-08-05 NOTE — Assessment & Plan Note (Signed)
Hgb A1c 7.2 3/27/149(was 7.4 07/22/12)-6.8 02/05/13. Takes Lantus 35units, Novolog 5 units with meals.                           

## 2013-08-05 NOTE — Assessment & Plan Note (Signed)
Persisted with Bun 46, creatinine 1.97, K 5.3--off Losartan and try Metoprolol-monitor renal function-update BMP in one month

## 2013-08-05 NOTE — Assessment & Plan Note (Signed)
Persisted, Hgb 8.3 07/02/13. Normal Iron 87, B12 740, Folate >20. Will dc Fe. Update CBC in one month.

## 2013-08-05 NOTE — Assessment & Plan Note (Signed)
Controlled on Metoprolol 25mg  bid. Update BMP in one month

## 2013-08-05 NOTE — Assessment & Plan Note (Signed)
takes Cymbalta 30mg (was reduced from 60mg  due to his falling). Stable.

## 2013-08-05 NOTE — Progress Notes (Signed)
Patient ID: [name] [name], male   DOB: Feb 20, 1920, 80 y.o.   MRN: [id number]   Code Status: DNR  Allergies  Allergen Reactions  . Enalapril Other (See Comments)    cough  . Milk-Related Compounds Other (See Comments)    Doesn't remember but its a mild reaction    Chief Complaint  Patient presents with  . Medical Managment of Chronic Issues    persisted cough.   . Acute Visit    HPI: Patient is a 80 y.o. male seen in the SNF at [facility] today for evaluation of anemia, blood pressure, persisted cough,  CKD,  and other chronic medical conditions.  Problem List Items Addressed This Visit   Diabetes mellitus (Chronic)     Hgb A1c 7.2 3/27/149(was 7.4 07/22/12)-6.8 02/05/13. Takes Lantus 35units, Novolog 5 units with meals.                            CHF (congestive heart failure)     Well compensated on Furosemide 20mg , trace BLE edema-chronic. CXR 06/29/13 showed no pulmonary vascular congestion.                             CKD (chronic kidney disease)     Persisted with Bun 46, creatinine 1.97, K 5.3--off Losartan and try Metoprolol-monitor renal function-update BMP in one month          Depression     takes Cymbalta 30mg (was reduced from 60mg  due to his falling). Stable.                                 Dementia     Stable on Namenda                             Essential hypertension, benign     Controlled on Metoprolol 25mg  bid. Update BMP in one month                              Unspecified hypothyroidism     Corrected with Levothyroxine 60mcg, TSH 1.894 06/30/13                              Anemia     Persisted, Hgb 8.3 07/02/13. Normal Iron 87, B12 740, Folate >20. Will dc Fe. Update CBC in one month.       Unspecified gastritis and gastroduodenitis without mention of hemorrhage     Stable on  Protonix 40mg  bid, no c/o stomach pain, indigestion, or bloody stools.                       Cough - Primary     Multiple factorials: after acute bronchitis, GERD, CHF-will try Spiriva, Advair, bid Tussionex.          Review of Systems:  Review of Systems  Constitutional: Negative for fever, chills, weight loss, malaise/fatigue and diaphoresis.  HENT: Positive for hearing loss. Negative for congestion, ear pain and sore throat.   Eyes: Negative for pain, discharge and redness.  Respiratory: Positive for cough and sputum production. Negative for shortness of breath and wheezing.        Clear  sputum. Associated with swallowing at times. Bed side swallow test negative  Cardiovascular: Positive for leg swelling (trace). Negative for chest pain, palpitations, orthopnea, claudication and PND.  Gastrointestinal: Negative for heartburn, abdominal pain, diarrhea, constipation and blood in stool.  Genitourinary: Positive for frequency. Negative for dysuria, urgency, hematuria and flank pain.  Musculoskeletal: Positive for back pain and joint pain. Negative for myalgias and neck pain.  Skin: Negative for itching and rash.  Neurological: Negative for dizziness, tingling, tremors, sensory change, speech change, focal weakness, seizures, loss of consciousness, weakness and headaches.  Endo/Heme/Allergies: Negative for environmental allergies and polydipsia. Does not bruise/bleed easily.  Psychiatric/Behavioral: Positive for memory loss. Negative for depression and hallucinations. The patient is nervous/anxious. The patient does not have insomnia.      Past Medical History  Diagnosis Date  . Hypothyroidism   . Hypertension   . CHF (congestive heart failure)   . Diabetes mellitus   . Coronary artery disease   . Restless leg syndrome   . Hyperlipemia   . Renal disease     stage II  . Sleep apnea   . Cellulitis and abscess of leg   . Obesity   . Duodenal ulcer, perforated     . Diverticulosis   . Internal hemorrhoids   . CAD (coronary artery disease)   . Anemia   . Duodenal ulcer   . Allergy   . Blood transfusion   . Cataract   . Tuberculosis     as a child  . Contact dermatitis and other eczema, due to unspecified cause 10/06/2012  . Senile dementia with depressive features 05/26/2012  . Hyposmolality and/or hyponatremia 04/02/2012  . Diarrhea 03/26/2012  . Hemorrhage of rectum and anus 03/25/2012  . Pneumonia, organism unspecified 03/12/2012  . 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 Folate-deficiency anemia 01/07/2012  . Thrombocytopenia, unspecified 09/26/2011  . Loss of weight 09/11/2011  . Dysphagia, unspecified(787.20) 06/28/2011  . Chronic kidney disease, stage III (moderate) 03/22/2011  . Cramp of limb 08/10/2010  . Chronic kidney disease, stage II (mild) 03/22/2011  . Personal history of fall 05/05/2009  . Unspecified vitamin D deficiency 02/24/2009  . Blood in stool 11/18/2008  . Dermatophytosis of the body 06/23/2007  . Obesity, unspecified 06/23/2007  . Unspecified tinnitus 06/23/2007  . Edema 06/23/2007  . Shortness of breath 06/23/2007  . Flatulence, eructation, and gas pain 06/23/2007  . Other general symptoms(780.99) 06/23/2007  . Memory loss 08/13/2003   Past Surgical History  Procedure Laterality Date  . Laparotomy  09/12/2011    Procedure: EXPLORATORY LAPAROTOMY;  Surgeon: Judieth Keens, DO;  Location: [facility];  Service: General;  Laterality: N/A;  REPAIR PERFORATED ULCER  . Gastrostomy  09/12/2011    Procedure: GASTROSTOMY;  Surgeon: Judieth Keens, DO;  Location: [facility];  Service: General;  Laterality: N/A;  BIOPSY DUODENAL ULCER  . Shoulder surgery  [place], [place]  . Jejunostomy feeding tube      removed  . Back surgery  1942  . Eye surgery  [phone]    cataracts  . Coronary artery bypass graft  2201   Social History:   reports that he has quit smoking. His smoking use  included Cigarettes. He smoked 0.00 packs per day. He has never used smokeless tobacco. He reports that he does not drink alcohol or use illicit drugs.  Family History  Problem Relation Age of Onset  . Stroke Father     Medications: Patient's  Medications  New Prescriptions   No medications on file  Previous Medications   CALCIUM CARBONATE-VITAMIN D (CALCIUM 600+D) 600-400 MG-UNIT PER TABLET    Take 1 tablet by mouth daily.   CHOLECALCIFEROL (VITAMIN D) 1000 UNITS TABLET    Take 1,000 Units by mouth daily. Take 1 tablet daily for vitamin D supplement.   DULOXETINE (CYMBALTA) 30 MG CAPSULE    Take 30 mg by mouth daily.   FOLIC ACID (FOLVITE) 1 MG TABLET       FUROSEMIDE (LASIX) 20 MG TABLET    Take 20 mg by mouth daily.    INSULIN ASPART (NOVOLOG) 100 UNIT/ML INJECTION    Inject 5 Units into the skin 3 (three) times daily before meals. For CBG>150   INSULIN GLARGINE (LANTUS) 100 UNIT/ML INJECTION    Inject 35 Units into the skin at bedtime.    LEVOTHYROXINE (SYNTHROID, LEVOTHROID) 75 MCG TABLET    Take 75 mcg by mouth daily. Take 1 tablet daily  for thyroid supplement.   MEMANTINE (NAMENDA) 10 MG TABLET    Take 28 mg by mouth daily. Take 1 tablet twice daily for dementia/Alzheimer's disease.   METOPROLOL TARTRATE (LOPRESSOR) 25 MG TABLET    Take 25 mg by mouth 2 (two) times daily.   OMEGA-3 FATTY ACIDS (FISH OIL) 1200 MG CAPS    Take 1 capsule by mouth daily.   PANTOPRAZOLE (PROTONIX) 40 MG TABLET    Take 40 mg by mouth 2 (two) times daily. Take 1 tablet twice daily as needed to reduce stomach acid.   VITAMIN B-12 (CYANOCOBALAMIN) 1000 MCG TABLET    Take 1,000 mcg by mouth daily.  Modified Medications   No medications on file  Discontinued Medications   No medications on file     Physical Exam: Physical Exam  Constitutional: He is oriented to person, place, and time. He appears well-developed and well-nourished. No distress.  HENT:  Head: Normocephalic and atraumatic.  Right Ear:  External ear normal.  Left Ear: External ear normal.  Nose: Nose normal.  Mouth/Throat: Oropharynx is clear and moist. No oropharyngeal exudate.  Eyes: Conjunctivae and EOM are normal. Pupils are equal, round, and reactive to light.  Neck: Normal range of motion. Neck supple. No JVD present. No thyromegaly present.  Cardiovascular: Normal rate, regular rhythm and normal heart sounds.   No murmur heard. Pulmonary/Chest: Effort normal. No respiratory distress. He has no wheezes. He has rales in the right lower field and the left lower field. He exhibits no tenderness.  Bibasilar as prior.   Abdominal: Soft. Bowel sounds are normal. He exhibits no distension and no mass. There is no tenderness. There is no rebound.  Musculoskeletal: Normal range of motion. He exhibits edema (trace in BLE).  Lymphadenopathy:    He has no cervical adenopathy.  Neurological: He is alert and oriented to person, place, and time. He has normal reflexes. No cranial nerve deficit. Coordination normal.  Skin: Skin is warm and dry. No rash noted. He is not diaphoretic. No erythema.  Psychiatric: He has a normal mood and affect. His behavior is normal. Judgment and thought content normal.    Filed Vitals:   08/05/13 1500  BP: 150/80  Pulse: 66  Temp: 97.7 F (36.5 C)  TempSrc: Tympanic  Resp: 20      Labs reviewed: Basic Metabolic Panel:  Recent Labs  13/08/65  04/14/13 04/21/13 06/30/13  NA 133*  < > 137 137 142  K 4.6  < > 4.8 5.1 5.3  BUN 36*  < > 34* 39* 46*  CREATININE 1.5*  < > 1.8* 2.0* 2.0*  TSH 1.76  --  2.92  --  1.89  < > = values in this interval not displayed. Liver Function Tests:  Recent Labs  10/23/12 04/14/13 06/30/13  AST 12* 10* 9*  ALT 8* 8* 8*  ALKPHOS 89 62 66   CBC:  Recent Labs  04/21/13 06/30/13 07/02/13  WBC 4.5 4.4 3.7  HGB 8.5* 8.4* 8.3*  HCT 39* 25* 24*  PLT 71* 83* 77*   Past Procedures:  CXR 06/29/13 showed there is mild patchy atelectasis or interstitial  pneumonitis at the right lung base appearing significantly improved.   Assessment/Plan Cough Multiple factorials: after acute bronchitis, GERD, CHF-will try Spiriva, Advair, bid Tussionex.     Unspecified gastritis and gastroduodenitis without mention of hemorrhage Stable on Protonix 40mg  bid, no c/o stomach pain, indigestion, or bloody stools.                     Anemia Persisted, Hgb 8.3 07/02/13. Normal Iron 87, B12 740, Folate >20. Will dc Fe. Update CBC in one month.     Unspecified hypothyroidism Corrected with Levothyroxine 41mcg, TSH 1.894 06/30/13                            Essential hypertension, benign Controlled on Metoprolol 25mg  bid. Update BMP in one month                            Dementia Stable on Namenda                           Depression takes Cymbalta 30mg (was reduced from 60mg  due to his falling). Stable.                               CKD (chronic kidney disease) Persisted with Bun 46, creatinine 1.97, K 5.3--off Losartan and try Metoprolol-monitor renal function-update BMP in one month        CHF (congestive heart failure) Well compensated on Furosemide 20mg , trace BLE edema-chronic. CXR 06/29/13 showed no pulmonary vascular congestion.                           Diabetes mellitus Hgb A1c 7.2 3/27/149(was 7.4 07/22/12)-6.8 02/05/13. Takes Lantus 35units, Novolog 5 units with meals.                            Family/ Staff Communication: observe the patient  Goals of Care: SNF  Labs/tests ordered: none

## 2013-08-05 NOTE — Assessment & Plan Note (Signed)
Corrected with Levothyroxine 43mcg, TSH 1.894 06/30/13

## 2013-08-05 NOTE — Assessment & Plan Note (Signed)
Well compensated on Furosemide 20mg, trace BLE edema-chronic. CXR 06/29/13 showed no pulmonary vascular congestion.                            

## 2013-08-05 NOTE — Assessment & Plan Note (Signed)
Stable on Protonix 40mg  bid, no c/o stomach pain, indigestion, or bloody stools.

## 2013-08-05 NOTE — Assessment & Plan Note (Addendum)
Multiple factorials: after acute bronchitis, GERD, CHF-will try Spiriva, Advair, bid Tussionex.

## 2013-08-05 NOTE — Assessment & Plan Note (Signed)
Stable on Namenda    

## 2013-08-13 LAB — CBC AND DIFFERENTIAL
HCT: 23 % — AB (ref 41–53)
Hemoglobin: 3 g/dL — AB (ref 13.5–17.5)
PLATELETS: 58 10*3/uL — AB (ref 150–399)
WBC: 4.1 10^3/mL

## 2013-08-13 LAB — BASIC METABOLIC PANEL
BUN: 35 mg/dL — AB (ref 4–21)
CREATININE: 1.7 mg/dL — AB (ref 0.6–1.3)
Glucose: 77 mg/dL
POTASSIUM: 4.7 mmol/L (ref 3.4–5.3)
Sodium: 140 mmol/L (ref 137–147)

## 2013-08-17 ENCOUNTER — Non-Acute Institutional Stay (SKILLED_NURSING_FACILITY): Payer: Medicare Other | Admitting: Nurse Practitioner

## 2013-08-17 ENCOUNTER — Encounter: Payer: Self-pay | Admitting: Nurse Practitioner

## 2013-08-17 DIAGNOSIS — D649 Anemia, unspecified: Secondary | ICD-10-CM

## 2013-08-17 DIAGNOSIS — I509 Heart failure, unspecified: Secondary | ICD-10-CM

## 2013-08-17 DIAGNOSIS — E119 Type 2 diabetes mellitus without complications: Secondary | ICD-10-CM

## 2013-08-17 DIAGNOSIS — D696 Thrombocytopenia, unspecified: Secondary | ICD-10-CM

## 2013-08-17 DIAGNOSIS — E039 Hypothyroidism, unspecified: Secondary | ICD-10-CM

## 2013-08-17 DIAGNOSIS — I10 Essential (primary) hypertension: Secondary | ICD-10-CM

## 2013-08-17 DIAGNOSIS — R05 Cough: Secondary | ICD-10-CM

## 2013-08-17 DIAGNOSIS — F32A Depression, unspecified: Secondary | ICD-10-CM

## 2013-08-17 DIAGNOSIS — F329 Major depressive disorder, single episode, unspecified: Secondary | ICD-10-CM

## 2013-08-17 DIAGNOSIS — R059 Cough, unspecified: Secondary | ICD-10-CM

## 2013-08-17 DIAGNOSIS — N189 Chronic kidney disease, unspecified: Secondary | ICD-10-CM

## 2013-08-17 DIAGNOSIS — F3289 Other specified depressive episodes: Secondary | ICD-10-CM

## 2013-08-17 DIAGNOSIS — F0390 Unspecified dementia without behavioral disturbance: Secondary | ICD-10-CM

## 2013-08-17 NOTE — Assessment & Plan Note (Signed)
Well compensated on Furosemide 20mg , trace BLE edema-chronic. CXR 06/29/13 showed no pulmonary vascular congestion.

## 2013-08-17 NOTE — Progress Notes (Signed)
Patient ID: [name] [name], male   DOB: [birth year], 80 y.o.   MRN: [id number]   Code Status: DNR  Allergies  Allergen Reactions  . Enalapril Other (See Comments)    cough  . Milk-Related Compounds Other (See Comments)    Doesn't remember but its a mild reaction    Chief Complaint  Patient presents with  . Medical Managment of Chronic Issues    thrombocytopnea, anemia, CKD  . Acute Visit    HPI: Patient is a 80 y.o. male seen in the SNF at [facility] today for evaluation of anemia, blood pressure,  CKD,  and other chronic medical conditions.  Problem List Items Addressed This Visit   Anemia - Primary     Persisted, Hgb 8.3 07/02/13. Normal Iron 87, B12 740, Folate >20. Off Fe. Now Hgb 8.0 08/13/13. Will update CBC and serum Iron next lab.         CHF (congestive heart failure)     Well compensated on Furosemide 20mg , trace BLE edema-chronic. CXR 06/29/13 showed no pulmonary vascular congestion.                               CKD (chronic kidney disease)     Persisted with Bun 46, creatinine 1.97, K 5.3--improved K 4.7, Bun 35, creatinine 1.67 08/13/13 since off Losartan and started Metoprolol            Cough     Multiple factorials: after acute bronchitis, GERD, CHF-better with  Spiriva, Advair, bid Tussionex.         Dementia     Stable on Namenda                               Depression     takes Cymbalta 30mg (was reduced from 60mg  due to his falling). Stable.                                   Diabetes mellitus (Chronic)     Hgb A1c 7.2 3/27/149(was 7.4 07/22/12)-6.8 02/05/13. Takes Lantus 35units, Novolog 5 units with meals.                              Essential hypertension, benign     Controlled on Metoprolol 25mg  bid. Creatinine down from 1.97 06/30/13 to 1.67 08/13/13 since off Losartan.                                 Thrombocytopenia, unspecified     Persisted with platelets: 58 08/13/13, 77 07/02/13, 83 06/30/13, 71 04/21/13, 79 04/14/13. Repeat CBC and Fe next lab. Hematology and GI consultation may be considered if no better. No apparent bleeding presently.     Unspecified hypothyroidism     Corrected with Levothyroxine 96mcg, TSH 1.894 06/30/13                                   Review of Systems:  Review of Systems  Constitutional: Negative for fever, chills, weight loss, malaise/fatigue and diaphoresis.  HENT: Positive for hearing loss. Negative for congestion, ear pain and sore throat.   Eyes: Negative  for pain, discharge and redness.  Respiratory: Positive for cough and sputum production. Negative for shortness of breath and wheezing.        Clear sputum. Associated with swallowing at times. Bed side swallow test negative  Cardiovascular: Positive for leg swelling (trace). Negative for chest pain, palpitations, orthopnea, claudication and PND.  Gastrointestinal: Negative for heartburn, abdominal pain, diarrhea, constipation and blood in stool.  Genitourinary: Positive for frequency. Negative for dysuria, urgency, hematuria and flank pain.  Musculoskeletal: Positive for back pain and joint pain. Negative for myalgias and neck pain.  Skin: Negative for itching and rash.  Neurological: Negative for dizziness, tingling, tremors, sensory change, speech change, focal weakness, seizures, loss of consciousness, weakness and headaches.  Endo/Heme/Allergies: Negative for environmental allergies and polydipsia. Does not bruise/bleed easily.  Psychiatric/Behavioral: Positive for memory loss. Negative for depression and hallucinations. The patient is nervous/anxious. The patient does not have insomnia.      Past Medical History  Diagnosis Date  . Hypothyroidism   . Hypertension   . CHF (congestive heart failure)   .  Diabetes mellitus   . Coronary artery disease   . Restless leg syndrome   . Hyperlipemia   . Renal disease     stage II  . Sleep apnea   . Cellulitis and abscess of leg   . Obesity   . Duodenal ulcer, perforated   . Diverticulosis   . Internal hemorrhoids   . CAD (coronary artery disease)   . Anemia   . Duodenal ulcer   . Allergy   . Blood transfusion   . Cataract   . Tuberculosis     as a child  . Contact dermatitis and other eczema, due to unspecified cause 10/06/2012  . Senile dementia with depressive features 05/26/2012  . Hyposmolality and/or hyponatremia 04/02/2012  . Diarrhea 03/26/2012  . Hemorrhage of rectum and anus 03/25/2012  . Pneumonia, organism unspecified 03/12/2012  . 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 Folate-deficiency anemia 01/07/2012  . Thrombocytopenia, unspecified 09/26/2011  . Loss of weight 09/11/2011  . Dysphagia, unspecified(787.20) 06/28/2011  . Chronic kidney disease, stage III (moderate) 03/22/2011  . Cramp of limb 08/10/2010  . Chronic kidney disease, stage II (mild) 03/22/2011  . Personal history of fall 05/05/2009  . Unspecified vitamin D deficiency 02/24/2009  . Blood in stool 11/18/2008  . Dermatophytosis of the body 06/23/2007  . Obesity, unspecified 06/23/2007  . Unspecified tinnitus 06/23/2007  . Edema 06/23/2007  . Shortness of breath 06/23/2007  . Flatulence, eructation, and gas pain 06/23/2007  . Other general symptoms(780.99) 06/23/2007  . Memory loss 08/13/2003   Past Surgical History  Procedure Laterality Date  . Laparotomy  09/12/2011    Procedure: EXPLORATORY LAPAROTOMY;  Surgeon: Judieth Keens, DO;  Location: [facility];  Service: General;  Laterality: N/A;  REPAIR PERFORATED ULCER  . Gastrostomy  09/12/2011    Procedure: GASTROSTOMY;  Surgeon: Judieth Keens, DO;  Location: [facility];  Service: General;  Laterality: N/A;  BIOPSY DUODENAL ULCER  . Shoulder surgery  [place],  [place]  . Jejunostomy feeding tube      removed  . Back surgery  1942  . Eye surgery  [phone]    cataracts  . Coronary artery bypass graft  2201   Social History:   reports that he has quit smoking. His smoking use included Cigarettes. He smoked 0.00 packs per day. He has never used smokeless tobacco. He reports that he  does not drink alcohol or use illicit drugs.  Family History  Problem Relation Age of Onset  . Stroke Father     Medications: Patient's Medications  New Prescriptions   No medications on file  Previous Medications   CALCIUM CARBONATE-VITAMIN D (CALCIUM 600+D) 600-400 MG-UNIT PER TABLET    Take 1 tablet by mouth daily.   CHOLECALCIFEROL (VITAMIN D) 1000 UNITS TABLET    Take 1,000 Units by mouth daily. Take 1 tablet daily for vitamin D supplement.   DULOXETINE (CYMBALTA) 30 MG CAPSULE    Take 30 mg by mouth daily.   FOLIC ACID (FOLVITE) 1 MG TABLET       FUROSEMIDE (LASIX) 20 MG TABLET    Take 20 mg by mouth daily.    INSULIN ASPART (NOVOLOG) 100 UNIT/ML INJECTION    Inject 5 Units into the skin 3 (three) times daily before meals. For CBG>150   INSULIN GLARGINE (LANTUS) 100 UNIT/ML INJECTION    Inject 35 Units into the skin at bedtime.    LEVOTHYROXINE (SYNTHROID, LEVOTHROID) 75 MCG TABLET    Take 75 mcg by mouth daily. Take 1 tablet daily  for thyroid supplement.   MEMANTINE (NAMENDA) 10 MG TABLET    Take 28 mg by mouth daily. Take 1 tablet twice daily for dementia/Alzheimer's disease.   METOPROLOL TARTRATE (LOPRESSOR) 25 MG TABLET    Take 25 mg by mouth 2 (two) times daily.   OMEGA-3 FATTY ACIDS (FISH OIL) 1200 MG CAPS    Take 1 capsule by mouth daily.   PANTOPRAZOLE (PROTONIX) 40 MG TABLET    Take 40 mg by mouth 2 (two) times daily. Take 1 tablet twice daily as needed to reduce stomach acid.   VITAMIN B-12 (CYANOCOBALAMIN) 1000 MCG TABLET    Take 1,000 mcg by mouth daily.  Modified Medications   No medications on file  Discontinued Medications   No medications on  file     Physical Exam: Physical Exam  Constitutional: He is oriented to person, place, and time. He appears well-developed and well-nourished. No distress.  HENT:  Head: Normocephalic and atraumatic.  Right Ear: External ear normal.  Left Ear: External ear normal.  Nose: Nose normal.  Mouth/Throat: Oropharynx is clear and moist. No oropharyngeal exudate.  Eyes: Conjunctivae and EOM are normal. Pupils are equal, round, and reactive to light.  Neck: Normal range of motion. Neck supple. No JVD present. No thyromegaly present.  Cardiovascular: Normal rate, regular rhythm and normal heart sounds.   No murmur heard. Pulmonary/Chest: Effort normal. No respiratory distress. He has no wheezes. He has rales in the right lower field and the left lower field. He exhibits no tenderness.  Bibasilar as prior.   Abdominal: Soft. Bowel sounds are normal. He exhibits no distension and no mass. There is no tenderness. There is no rebound.  Musculoskeletal: Normal range of motion. He exhibits edema (trace in BLE).  Lymphadenopathy:    He has no cervical adenopathy.  Neurological: He is alert and oriented to person, place, and time. He has normal reflexes. No cranial nerve deficit. Coordination normal.  Skin: Skin is warm and dry. No rash noted. He is not diaphoretic. No erythema.  Psychiatric: He has a normal mood and affect. His behavior is normal. Judgment and thought content normal.    Filed Vitals:   08/17/13 1435  BP: 110/60  Pulse: 66  Temp: 97.8 F (36.6 C)  TempSrc: Tympanic  Resp: 18      Labs reviewed: Basic Metabolic Panel:  Recent Labs  10/23/12  04/14/13 04/21/13 06/30/13 08/13/13  NA 133*  < > 137 137 142 140  K 4.6  < > 4.8 5.1 5.3 4.7  BUN 36*  < > 34* 39* 46* 35*  CREATININE 1.5*  < > 1.8* 2.0* 2.0* 1.7*  TSH 1.76  --  2.92  --  1.89  --   < > = values in this interval not displayed. Liver Function Tests:  Recent Labs  10/23/12 04/14/13 06/30/13  AST 12* 10* 9*   ALT 8* 8* 8*  ALKPHOS 89 62 66   CBC:  Recent Labs  06/30/13 07/02/13 08/13/13  WBC 4.4 3.7 4.1  HGB 8.4* 8.3* 3.0*  HCT 25* 24* 23*  PLT 83* 77* 58*   Past Procedures:  CXR 06/29/13 showed there is mild patchy atelectasis or interstitial pneumonitis at the right lung base appearing significantly improved.   Assessment/Plan Anemia Persisted, Hgb 8.3 07/02/13. Normal Iron 87, B12 740, Folate >20. Off Fe. Now Hgb 8.0 08/13/13. Will update CBC and serum Iron next lab.       Essential hypertension, benign Controlled on Metoprolol 25mg  bid. Creatinine down from 1.97 06/30/13 to 1.67 08/13/13 since off Losartan.                              Diabetes mellitus Hgb A1c 7.2 3/27/149(was 7.4 07/22/12)-6.8 02/05/13. Takes Lantus 35units, Novolog 5 units with meals.                            Depression takes Cymbalta 30mg (was reduced from 60mg  due to his falling). Stable.                                 Dementia Stable on Namenda                             Cough Multiple factorials: after acute bronchitis, GERD, CHF-better with  Spiriva, Advair, bid Tussionex.       CKD (chronic kidney disease) Persisted with Bun 46, creatinine 1.97, K 5.3--improved K 4.7, Bun 35, creatinine 1.67 08/13/13 since off Losartan and started Metoprolol          CHF (congestive heart failure) Well compensated on Furosemide 20mg , trace BLE edema-chronic. CXR 06/29/13 showed no pulmonary vascular congestion.                             Unspecified hypothyroidism Corrected with Levothyroxine 108mcg, TSH 1.894 06/30/13                              Thrombocytopenia, unspecified Persisted with platelets: 58 08/13/13, 77 07/02/13, 83 06/30/13, 71 04/21/13, 79 04/14/13. Repeat CBC and Fe next lab. Hematology and GI consultation may be considered if  no better. No apparent bleeding presently.     Family/ Staff Communication: observe the patient  Goals of Care: SNF  Labs/tests ordered: CBC and Iron.

## 2013-08-17 NOTE — Assessment & Plan Note (Signed)
Hgb A1c 7.2 3/27/149(was 7.4 07/22/12)-6.8 02/05/13. Takes Lantus 35units, Novolog 5 units with meals.

## 2013-08-17 NOTE — Assessment & Plan Note (Signed)
Corrected with Levothyroxine 75mcg, TSH 1.894 06/30/13                           

## 2013-08-17 NOTE — Assessment & Plan Note (Signed)
Persisted, Hgb 8.3 07/02/13. Normal Iron 87, B12 740, Folate >20. Off Fe. Now Hgb 8.0 08/13/13. Will update CBC and serum Iron next lab.

## 2013-08-17 NOTE — Assessment & Plan Note (Signed)
Controlled on Metoprolol 25mg  bid. Creatinine down from 1.97 06/30/13 to 1.67 08/13/13 since off Losartan.

## 2013-08-17 NOTE — Assessment & Plan Note (Addendum)
Persisted with platelets: 58 08/13/13, 77 07/02/13, 83 06/30/13, 71 04/21/13, 79 04/14/13. Repeat CBC and Fe next lab. Hematology and GI consultation may be considered if no better. No apparent bleeding presently.

## 2013-08-17 NOTE — Assessment & Plan Note (Signed)
Multiple factorials: after acute bronchitis, GERD, CHF-better with  Spiriva, Advair, bid Tussionex.

## 2013-08-17 NOTE — Assessment & Plan Note (Signed)
takes Cymbalta 30mg(was reduced from 60mg due to his falling). Stable.                              

## 2013-08-17 NOTE — Assessment & Plan Note (Signed)
Persisted with Bun 46, creatinine 1.97, K 5.3--improved K 4.7, Bun 35, creatinine 1.67 08/13/13 since off Losartan and started Metoprolol

## 2013-08-17 NOTE — Assessment & Plan Note (Signed)
Stable on Namenda    

## 2013-08-18 LAB — CBC AND DIFFERENTIAL
HCT: 23 % — AB (ref 41–53)
Hemoglobin: 7.9 g/dL — AB (ref 13.5–17.5)
Platelets: 54 10*3/uL — AB (ref 150–399)
WBC: 3.8 10^3/mL

## 2013-08-21 LAB — CBC AND DIFFERENTIAL
HCT: 24 % — AB (ref 41–53)
HEMOGLOBIN: 8.2 g/dL — AB (ref 13.5–17.5)
PLATELETS: 56 10*3/uL — AB (ref 150–399)
WBC: 4.8 10*3/mL

## 2013-08-24 ENCOUNTER — Encounter: Payer: Self-pay | Admitting: Nurse Practitioner

## 2013-08-24 ENCOUNTER — Non-Acute Institutional Stay (SKILLED_NURSING_FACILITY): Payer: Medicare Other | Admitting: Nurse Practitioner

## 2013-08-24 DIAGNOSIS — D696 Thrombocytopenia, unspecified: Secondary | ICD-10-CM

## 2013-08-24 DIAGNOSIS — F0390 Unspecified dementia without behavioral disturbance: Secondary | ICD-10-CM

## 2013-08-24 DIAGNOSIS — F3289 Other specified depressive episodes: Secondary | ICD-10-CM

## 2013-08-24 DIAGNOSIS — I10 Essential (primary) hypertension: Secondary | ICD-10-CM

## 2013-08-24 DIAGNOSIS — I509 Heart failure, unspecified: Secondary | ICD-10-CM

## 2013-08-24 DIAGNOSIS — F32A Depression, unspecified: Secondary | ICD-10-CM

## 2013-08-24 DIAGNOSIS — F329 Major depressive disorder, single episode, unspecified: Secondary | ICD-10-CM

## 2013-08-24 DIAGNOSIS — D649 Anemia, unspecified: Secondary | ICD-10-CM

## 2013-08-24 DIAGNOSIS — E039 Hypothyroidism, unspecified: Secondary | ICD-10-CM

## 2013-08-24 NOTE — Progress Notes (Signed)
Patient ID: [name] [name], male   DOB: 1920/06/20, 80 y.o.   MRN: [id number]   Code Status: DNR  Allergies  Allergen Reactions  . Enalapril Other (See Comments)    cough  . Milk-Related Compounds Other (See Comments)    Doesn't remember but its a mild reaction    Chief Complaint  Patient presents with  . Medical Managment of Chronic Issues    anemia, right buttock pressure ulcer.   . Acute Visit    HPI: Patient is a 80 y.o. male seen in the SNF at [facility] today for evaluation of anemia, blood pressure,  CKD,  and other chronic medical conditions.  Problem List Items Addressed This Visit   CHF (congestive heart failure)     Well compensated on Furosemide 20mg , trace BLE edema-chronic. CXR 06/29/13 showed no pulmonary vascular congestion.                                 Dementia     Stable on Namenda                                 Depression     takes Cymbalta 30mg (was reduced from 60mg  due to his falling). Stable.                                     Essential hypertension, benign     Controlled on Metoprolol 25mg  bid. Creatinine down from 1.97 06/30/13 to 1.67 08/13/13 since off Losartan.                                  Thrombocytopenia, unspecified     Persisted with platelets: 56 08/21/13, 58 08/13/13, 77 07/02/13, 83 06/30/13, 71 04/21/13, 79 04/14/13. No apparent bleeding presently.      UNSPECIFIED ANEMIA     Worse, Hgb 8.3 07/02/13. Normal Iron 87, B12 740, Folate >20. Off Fe. Now Hgb 8.0 08/13/13-7.9 08/18/13-8.2 08/21/13. Fe 74 08/18/13. GI consult-08/26/13. CBC in am. Continue Folic acid and [id number]          Unspecified hypothyroidism - Primary     Corrected with Levothyroxine 62mcg, TSH 1.894 06/30/13                                     Review of Systems:  Review of Systems  Constitutional: Negative for fever,  chills, weight loss, malaise/fatigue and diaphoresis.  HENT: Positive for hearing loss. Negative for congestion, ear pain and sore throat.   Eyes: Negative for pain, discharge and redness.  Respiratory: Positive for cough and sputum production. Negative for shortness of breath and wheezing.        Clear sputum. Associated with swallowing at times. Bed side swallow test negative  Cardiovascular: Positive for leg swelling (trace). Negative for chest pain, palpitations, orthopnea, claudication and PND.  Gastrointestinal: Negative for heartburn, abdominal pain, diarrhea, constipation and blood in stool.  Genitourinary: Positive for frequency. Negative for dysuria, urgency, hematuria and flank pain.  Musculoskeletal: Positive for back pain and joint pain. Negative for myalgias and neck pain.  Skin: Negative for itching and rash.  Small open area right buttock.   Neurological: Negative for dizziness, tingling, tremors, sensory change, speech change, focal weakness, seizures, loss of consciousness, weakness and headaches.  Endo/Heme/Allergies: Negative for environmental allergies and polydipsia. Does not bruise/bleed easily.  Psychiatric/Behavioral: Positive for memory loss. Negative for depression and hallucinations. The patient is nervous/anxious. The patient does not have insomnia.      Past Medical History  Diagnosis Date  . Hypothyroidism   . Hypertension   . CHF (congestive heart failure)   . Diabetes mellitus   . Coronary artery disease   . Restless leg syndrome   . Hyperlipemia   . Renal disease     stage II  . Sleep apnea   . Cellulitis and abscess of leg   . Obesity   . Duodenal ulcer, perforated   . Diverticulosis   . Internal hemorrhoids   . CAD (coronary artery disease)   . Anemia   . Duodenal ulcer   . Allergy   . Blood transfusion   . Cataract   . Tuberculosis     as a child  . Contact dermatitis and other eczema, due to unspecified cause 10/06/2012  . Senile  dementia with depressive features 05/26/2012  . Hyposmolality and/or hyponatremia 04/02/2012  . Diarrhea 03/26/2012  . Hemorrhage of rectum and anus 03/25/2012  . Pneumonia, organism unspecified 03/12/2012  . 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 Folate-deficiency anemia 01/07/2012  . Thrombocytopenia, unspecified 09/26/2011  . Loss of weight 09/11/2011  . Dysphagia, unspecified(787.20) 06/28/2011  . Chronic kidney disease, stage III (moderate) 03/22/2011  . Cramp of limb 08/10/2010  . Chronic kidney disease, stage II (mild) 03/22/2011  . Personal history of fall 05/05/2009  . Unspecified vitamin D deficiency 02/24/2009  . Blood in stool 11/18/2008  . Dermatophytosis of the body 06/23/2007  . Obesity, unspecified 06/23/2007  . Unspecified tinnitus 06/23/2007  . Edema 06/23/2007  . Shortness of breath 06/23/2007  . Flatulence, eructation, and gas pain 06/23/2007  . Other general symptoms(780.99) 06/23/2007  . Memory loss 08/13/2003   Past Surgical History  Procedure Laterality Date  . Laparotomy  09/12/2011    Procedure: EXPLORATORY LAPAROTOMY;  Surgeon: Judieth Keens, DO;  Location: [facility];  Service: General;  Laterality: N/A;  REPAIR PERFORATED ULCER  . Gastrostomy  09/12/2011    Procedure: GASTROSTOMY;  Surgeon: Judieth Keens, DO;  Location: [facility];  Service: General;  Laterality: N/A;  BIOPSY DUODENAL ULCER  . Shoulder surgery  [place], [place]  . Jejunostomy feeding tube      removed  . Back surgery  1942  . Eye surgery  [phone]    cataracts  . Coronary artery bypass graft  2201   Social History:   reports that he has quit smoking. His smoking use included Cigarettes. He smoked 0.00 packs per day. He has never used smokeless tobacco. He reports that he does not drink alcohol or use illicit drugs.  Family History  Problem Relation Age of Onset  . Stroke Father     Medications: Patient's Medications  New Prescriptions    No medications on file  Previous Medications   CALCIUM CARBONATE-VITAMIN D (CALCIUM 600+D) 600-400 MG-UNIT PER TABLET    Take 1 tablet by mouth daily.   CHOLECALCIFEROL (VITAMIN D) 1000 UNITS TABLET    Take 1,000 Units by mouth daily. Take 1 tablet daily for vitamin D supplement.   DULOXETINE (CYMBALTA) 30 MG CAPSULE    Take 30  mg by mouth daily.   FOLIC ACID (FOLVITE) 1 MG TABLET       FUROSEMIDE (LASIX) 20 MG TABLET    Take 20 mg by mouth daily.    INSULIN ASPART (NOVOLOG) 100 UNIT/ML INJECTION    Inject 5 Units into the skin 3 (three) times daily before meals. For CBG>150   INSULIN GLARGINE (LANTUS) 100 UNIT/ML INJECTION    Inject 35 Units into the skin at bedtime.    LEVOTHYROXINE (SYNTHROID, LEVOTHROID) 75 MCG TABLET    Take 75 mcg by mouth daily. Take 1 tablet daily  for thyroid supplement.   MEMANTINE (NAMENDA) 10 MG TABLET    Take 28 mg by mouth daily. Take 1 tablet twice daily for dementia/Alzheimer's disease.   METOPROLOL TARTRATE (LOPRESSOR) 25 MG TABLET    Take 25 mg by mouth 2 (two) times daily.   OMEGA-3 FATTY ACIDS (FISH OIL) 1200 MG CAPS    Take 1 capsule by mouth daily.   PANTOPRAZOLE (PROTONIX) 40 MG TABLET    Take 40 mg by mouth 2 (two) times daily. Take 1 tablet twice daily as needed to reduce stomach acid.   VITAMIN B-12 (CYANOCOBALAMIN) 1000 MCG TABLET    Take 1,000 mcg by mouth daily.  Modified Medications   No medications on file  Discontinued Medications   No medications on file     Physical Exam: Physical Exam  Constitutional: He is oriented to person, place, and time. He appears well-developed and well-nourished. No distress.  HENT:  Head: Normocephalic and atraumatic.  Right Ear: External ear normal.  Left Ear: External ear normal.  Nose: Nose normal.  Mouth/Throat: Oropharynx is clear and moist. No oropharyngeal exudate.  Eyes: Conjunctivae and EOM are normal. Pupils are equal, round, and reactive to light.  Neck: Normal range of motion. Neck supple. No  JVD present. No thyromegaly present.  Cardiovascular: Normal rate, regular rhythm and normal heart sounds.   No murmur heard. Pulmonary/Chest: Effort normal. No respiratory distress. He has no wheezes. He has rales in the right lower field and the left lower field. He exhibits no tenderness.  Bibasilar as prior.   Abdominal: Soft. Bowel sounds are normal. He exhibits no distension and no mass. There is no tenderness. There is no rebound.  Musculoskeletal: Normal range of motion. He exhibits edema (trace in BLE).  Lymphadenopathy:    He has no cervical adenopathy.  Neurological: He is alert and oriented to person, place, and time. He has normal reflexes. No cranial nerve deficit. Coordination normal.  Skin: Skin is warm and dry. No rash noted. He is not diaphoretic. No erythema.  Small open area right buttock.  Psychiatric: He has a normal mood and affect. His behavior is normal. Judgment and thought content normal.    Filed Vitals:   08/24/13 1232  BP: 110/60  Pulse: 68  Temp: 97.8 F (36.6 C)  TempSrc: Tympanic  Resp: 18      Labs reviewed: Basic Metabolic Panel:  Recent Labs  10/23/12  04/14/13 04/21/13 06/30/13 08/13/13  NA 133*  < > 137 137 142 140  K 4.6  < > 4.8 5.1 5.3 4.7  BUN 36*  < > 34* 39* 46* 35*  CREATININE 1.5*  < > 1.8* 2.0* 2.0* 1.7*  TSH 1.76  --  2.92  --  1.89  --   < > = values in this interval not displayed. Liver Function Tests:  Recent Labs  10/23/12 04/14/13 06/30/13  AST 12* 10* 9*  ALT  8* 8* 8*  ALKPHOS 89 62 66   CBC:  Recent Labs  08/13/13 08/18/13 08/21/13  WBC 4.1 3.8 4.8  HGB 3.0* 7.9* 8.2*  HCT 23* 23* 24*  PLT 58* 54* 56*   Past Procedures:  CXR 06/29/13 showed there is mild patchy atelectasis or interstitial pneumonitis at the right lung base appearing significantly improved.   Assessment/Plan Unspecified hypothyroidism Corrected with Levothyroxine 81mcg, TSH 1.894  06/30/13                                UNSPECIFIED ANEMIA Worse, Hgb 8.3 07/02/13. Normal Iron 87, B12 740, Folate >20. Off Fe. Now Hgb 8.0 08/13/13-7.9 08/18/13-8.2 08/21/13. Fe 74 08/18/13. GI consult-08/26/13. CBC in am. Continue Folic acid and B71        Thrombocytopenia, unspecified Persisted with platelets: 56 08/21/13, 58 08/13/13, 77 07/02/13, 83 06/30/13, 71 04/21/13, 79 04/14/13. No apparent bleeding presently.    Depression takes Cymbalta 30mg (was reduced from 60mg  due to his falling). Stable.                                   Dementia Stable on Namenda                               CHF (congestive heart failure) Well compensated on Furosemide 20mg , trace BLE edema-chronic. CXR 06/29/13 showed no pulmonary vascular congestion.                               Essential hypertension, benign Controlled on Metoprolol 25mg  bid. Creatinine down from 1.97 06/30/13 to 1.67 08/13/13 since off Losartan.                                  Family/ Staff Communication: observe the patient  Goals of Care: SNF  Labs/tests ordered: CBC in am

## 2013-08-24 NOTE — Assessment & Plan Note (Signed)
Stable on Namenda    

## 2013-08-24 NOTE — Assessment & Plan Note (Signed)
Persisted with platelets: 56 08/21/13, 58 08/13/13, 77 07/02/13, 83 06/30/13, 71 04/21/13, 79 04/14/13. No apparent bleeding presently.

## 2013-08-24 NOTE — Assessment & Plan Note (Signed)
Controlled on Metoprolol 25mg bid. Creatinine down from 1.97 06/30/13 to 1.67 08/13/13 since off Losartan.                             

## 2013-08-24 NOTE — Assessment & Plan Note (Signed)
Worse, Hgb 8.3 07/02/13. Normal Iron 87, B12 740, Folate >20. Off Fe. Now Hgb 8.0 08/13/13-7.9 08/18/13-8.2 08/21/13. Fe 74 08/18/13. GI consult-08/26/13. CBC in am. Continue Folic acid and E83

## 2013-08-24 NOTE — Assessment & Plan Note (Signed)
Corrected with Levothyroxine 75mcg, TSH 1.894 06/30/13                           

## 2013-08-24 NOTE — Assessment & Plan Note (Signed)
Well compensated on Furosemide 20mg, trace BLE edema-chronic. CXR 06/29/13 showed no pulmonary vascular congestion.                            

## 2013-08-24 NOTE — Assessment & Plan Note (Signed)
takes Cymbalta 30mg(was reduced from 60mg due to his falling). Stable.                              

## 2013-08-25 LAB — CBC AND DIFFERENTIAL
HEMATOCRIT: 23 % — AB (ref 41–53)
HEMATOCRIT: 24 % — AB (ref 41–53)
Hemoglobin: 8 g/dL — AB (ref 13.5–17.5)
Hemoglobin: 8.2 g/dL — AB (ref 13.5–17.5)
PLATELETS: 56 10*3/uL — AB (ref 150–399)
Platelets: 58 10*3/uL — AB (ref 150–399)
WBC: 3.7 10^3/mL
WBC: 4.8 10*3/mL

## 2013-08-26 ENCOUNTER — Encounter: Payer: Self-pay | Admitting: Gastroenterology

## 2013-08-26 ENCOUNTER — Ambulatory Visit (INDEPENDENT_AMBULATORY_CARE_PROVIDER_SITE_OTHER): Payer: Medicare Other | Admitting: Gastroenterology

## 2013-08-26 VITALS — BP 122/70 | HR 60 | Wt 228.6 lb

## 2013-08-26 DIAGNOSIS — D649 Anemia, unspecified: Secondary | ICD-10-CM

## 2013-08-26 DIAGNOSIS — D696 Thrombocytopenia, unspecified: Secondary | ICD-10-CM

## 2013-08-26 NOTE — Patient Instructions (Addendum)
Please have Primary Care Physician consider referral to Hematologist for chronic anemia, thrombocytopenia.   Thank you for choosing me and [facility].  Pricilla Riffle. Dagoberto Ligas., MD., Marval Regal  cc: Jeanmarie Hubert, MD

## 2013-08-26 NOTE — Progress Notes (Signed)
    History of Present Illness: This is a 80 year old nursing home resident with multiple comorbidities referred for evaluation of anemia. Wife is with him. Pt and wife report no GI bleeding, dark stool, blood in stool noted in NH. Hgb 8.3 07/02/13. Normal Iron 87, B12 740, Folate >20. Hgb 8.2 08/21/13. Platelets of 56k. He underwent colonoscopy in 11/2008 for evaluation of anemia showing diverticulosis and internal hemorrhoids. A suffered a perforated duodenal ulcer which was repaired in 08/2011. Has history of H. pylori gastritis which was treated. He underwent upper endoscopy in 11/2011 with findings of a small duodenal bulb ulcer, mild gastritis and a prior G-tube site. Biopsy showed no evidence of H. pylori gastritis. Pt has no GI complaints.  Review of Systems: Pertinent positive and negative review of systems were noted in the above HPI section. All other review of systems were otherwise negative.  Current Medications, Allergies, Past Medical History, Past Surgical History, Family History and Social History were reviewed in [employer] record.  Physical Exam: General: Well developed , well nourished, elderly, in a wheel chair, no acute distress Head: Normocephalic and atraumatic Eyes:  sclerae anicteric, EOMI Ears: Normal auditory acuity Mouth: No deformity or lesions Neck: Supple, no masses or thyromegaly Lungs: Clear throughout to auscultation Heart: Regular rate and rhythm; no murmurs, rubs or bruits Abdomen: Soft, non tender and non distended. No masses, hepatosplenomegaly or hernias noted. Normal Bowel sounds Musculoskeletal: Symmetrical with no gross deformities  Skin: No lesions on visible extremities Pulses:  Normal pulses noted Extremities: No clubbing, cyanosis, edema or deformities noted Neurological: Alert oriented x 4, grossly nonfocal Cervical Nodes:  No significant cervical adenopathy Inguinal Nodes: No significant inguinal adenopathy Psychological:   Alert and cooperative. Normal mood and affect  Assessment and Recommendations:  1. Chronic, normocytic anemia and thrombocytopenia. No evidence of ongoing GI blood loss. No need to repeat colonoscopy or upper endoscopy at this time . This problem needs hematology evaluation. Defer to PCP for appropriate referral and ongoing management.  2. History of a perforated duodenal ulcer. PPI BID long-term. Ongoing followup with PCP.

## 2013-08-28 ENCOUNTER — Telehealth: Payer: Self-pay | Admitting: Internal Medicine

## 2013-08-28 NOTE — Telephone Encounter (Signed)
C/D 08/28/13 for appt. 09/10/13

## 2013-08-28 NOTE — Telephone Encounter (Signed)
PATIENT IS SCHEDULED TO SEE DR. Juliann Mule 02/12 @ 10:30 [facility] AWARE.  REFERRING DR Fuller Plan DX- CHRONIC ANEMIA

## 2013-09-03 LAB — CBC AND DIFFERENTIAL
HEMATOCRIT: 22 % — AB (ref 41–53)
HEMOGLOBIN: 7.4 g/dL — AB (ref 13.5–17.5)
PLATELETS: 57 10*3/uL — AB (ref 150–399)
WBC: 4.2 10^3/mL

## 2013-09-05 LAB — CBC AND DIFFERENTIAL
HCT: 23 % — AB (ref 41–53)
Hemoglobin: 7.7 g/dL — AB (ref 13.5–17.5)
Platelets: 56 10*3/uL — AB (ref 150–399)
WBC: 4.6 10^3/mL

## 2013-09-08 LAB — CBC AND DIFFERENTIAL
HEMATOCRIT: 24 % — AB (ref 41–53)
Hemoglobin: 8 g/dL — AB (ref 13.5–17.5)
PLATELETS: 65 10*3/uL — AB (ref 150–399)
WBC: 4.2 10^3/mL

## 2013-09-10 ENCOUNTER — Encounter: Payer: Self-pay | Admitting: Internal Medicine

## 2013-09-10 ENCOUNTER — Other Ambulatory Visit: Payer: Medicare Other

## 2013-09-10 ENCOUNTER — Ambulatory Visit (HOSPITAL_BASED_OUTPATIENT_CLINIC_OR_DEPARTMENT_OTHER): Payer: Medicare Other

## 2013-09-10 ENCOUNTER — Telehealth: Payer: Self-pay | Admitting: Internal Medicine

## 2013-09-10 ENCOUNTER — Ambulatory Visit (HOSPITAL_BASED_OUTPATIENT_CLINIC_OR_DEPARTMENT_OTHER): Payer: Medicare Other | Admitting: Internal Medicine

## 2013-09-10 ENCOUNTER — Ambulatory Visit: Payer: Medicare Other

## 2013-09-10 VITALS — BP 115/53 | HR 90 | Temp 97.6°F | Resp 18 | Ht 68.0 in | Wt 229.8 lb

## 2013-09-10 DIAGNOSIS — D649 Anemia, unspecified: Secondary | ICD-10-CM

## 2013-09-10 DIAGNOSIS — F0390 Unspecified dementia without behavioral disturbance: Secondary | ICD-10-CM

## 2013-09-10 DIAGNOSIS — N189 Chronic kidney disease, unspecified: Secondary | ICD-10-CM

## 2013-09-10 DIAGNOSIS — D696 Thrombocytopenia, unspecified: Secondary | ICD-10-CM

## 2013-09-10 DIAGNOSIS — D631 Anemia in chronic kidney disease: Secondary | ICD-10-CM

## 2013-09-10 DIAGNOSIS — D539 Nutritional anemia, unspecified: Secondary | ICD-10-CM

## 2013-09-10 DIAGNOSIS — D6489 Other specified anemias: Secondary | ICD-10-CM

## 2013-09-10 DIAGNOSIS — N039 Chronic nephritic syndrome with unspecified morphologic changes: Principal | ICD-10-CM

## 2013-09-10 LAB — CBC & DIFF AND RETIC
BASO%: 0.2 % (ref 0.0–2.0)
Basophils Absolute: 0 10*3/uL (ref 0.0–0.1)
EOS%: 2.1 % (ref 0.0–7.0)
Eosinophils Absolute: 0.1 10*3/uL (ref 0.0–0.5)
HCT: 26.6 % — ABNORMAL LOW (ref 38.4–49.9)
HGB: 8.7 g/dL — ABNORMAL LOW (ref 13.0–17.1)
IMMATURE RETIC FRACT: 20.5 % — AB (ref 3.00–10.60)
LYMPH#: 1.6 10*3/uL (ref 0.9–3.3)
LYMPH%: 30.6 % (ref 14.0–49.0)
MCH: 37.7 pg — ABNORMAL HIGH (ref 27.2–33.4)
MCHC: 32.7 g/dL (ref 32.0–36.0)
MCV: 115.2 fL — ABNORMAL HIGH (ref 79.3–98.0)
MONO#: 0.4 10*3/uL (ref 0.1–0.9)
MONO%: 7.1 % (ref 0.0–14.0)
NEUT#: 3.1 10*3/uL (ref 1.5–6.5)
NEUT%: 60 % (ref 39.0–75.0)
NRBC: 0 % (ref 0–0)
Platelets: 70 10*3/uL — ABNORMAL LOW (ref 140–400)
RBC: 2.31 10*6/uL — AB (ref 4.20–5.82)
RDW: 14.5 % (ref 11.0–14.6)
RETIC CT ABS: 67.45 10*3/uL (ref 34.80–93.90)
Retic %: 2.92 % — ABNORMAL HIGH (ref 0.80–1.80)
WBC: 5.2 10*3/uL (ref 4.0–10.3)

## 2013-09-10 LAB — COMPREHENSIVE METABOLIC PANEL (CC13)
ALK PHOS: 84 U/L (ref 40–150)
ALT: 10 U/L (ref 0–55)
AST: 11 U/L (ref 5–34)
Albumin: 3.3 g/dL — ABNORMAL LOW (ref 3.5–5.0)
Anion Gap: 8 mEq/L (ref 3–11)
BUN: 41.4 mg/dL — AB (ref 7.0–26.0)
CO2: 23 mEq/L (ref 22–29)
Calcium: 9.6 mg/dL (ref 8.4–10.4)
Chloride: 104 mEq/L (ref 98–109)
Creatinine: 1.8 mg/dL — ABNORMAL HIGH (ref 0.7–1.3)
Glucose: 165 mg/dl — ABNORMAL HIGH (ref 70–140)
Potassium: 5.8 mEq/L — ABNORMAL HIGH (ref 3.5–5.1)
Sodium: 135 mEq/L — ABNORMAL LOW (ref 136–145)
Total Bilirubin: 0.28 mg/dL (ref 0.20–1.20)
Total Protein: 6.6 g/dL (ref 6.4–8.3)

## 2013-09-10 LAB — CHCC SMEAR

## 2013-09-10 LAB — IRON AND TIBC CHCC
%SAT: 40 % (ref 20–55)
Iron: 96 ug/dL (ref 42–163)
TIBC: 243 ug/dL (ref 202–409)
UIBC: 146 ug/dL (ref 117–376)

## 2013-09-10 LAB — FERRITIN CHCC: Ferritin: 341 ng/ml — ABNORMAL HIGH (ref 22–316)

## 2013-09-10 NOTE — Telephone Encounter (Signed)
gv and printed appt sched and avs for pt for Feb...sent pt to lab  °

## 2013-09-10 NOTE — Progress Notes (Signed)
Checked in new patient with no financial issues.  °

## 2013-09-10 NOTE — Patient Instructions (Signed)
Thrombocytopenia Thrombocytopenia is a condition in which there is an abnormally small number of platelets in your blood. Platelets are also called thrombocytes. Platelets are needed for blood clotting. CAUSES Thrombocytopenia is caused by:   Decreased production of platelets. This can be caused by:  Aplastic anemia in which your bone marrow quits making blood cells.  Cancer in the bone marrow.  Use of certain medicines, including chemotherapy.  Infection in the bone marrow.  Heavy alcohol consumption.  Increased destruction of platelets. This can be caused by:  Certain immune diseases.  Use of certain drugs.  Certain blood clotting disorders.  Certain inherited disorders.  Certain bleeding disorders.  Pregnancy.  Having an enlarged spleen (hypersplenism). In hypersplenism, the spleen gathers up platelets from circulation. This means the platelets are not available to help with blood clotting. The spleen can enlarge due to cirrhosis or other conditions. SYMPTOMS  The symptoms of thrombocytopenia are side effects of poor blood clotting. Some of these are:  Abnormal bleeding.  Nosebleeds.  Heavy menstrual periods.  Blood in the urine or stools.  Purpura. This is a purplish discoloration in the skin produced by small bleeding vessels near the surface of the skin.  Bruising.  A rash that may be petechial. This looks like pinpoint, purplish-red spots on the skin and mucous membranes. It is caused by bleeding from small blood vessels (capillaries). DIAGNOSIS  Your caregiver will make this diagnosis based on your exam and blood tests. Sometimes, a bone marrow study is done to look for the original cells (megakaryocytes) that make platelets. TREATMENT  Treatment depends on the cause of the condition.  Medicines may be given to help protect your platelets from being destroyed.  In some cases, a replacement (transfusion) of platelets may be required to stop or prevent  bleeding.  Sometimes, the spleen must be surgically removed. HOME CARE INSTRUCTIONS   Check the skin and linings inside your mouth for bruising or bleeding as directed by your caregiver.  Check your sputum, urine, and stool for blood as directed by your caregiver.  Do not return to any activities that could cause bumps or bruises until your caregiver says it is okay.  Take extra care not to cut yourself when shaving or when using scissors, needles, knives, and other tools.  Take extra care not to burn yourself when ironing or cooking.  Ask your caregiver if it is okay for you to drink alcohol.  Only take over-the-counter or prescription medicines as directed by your caregiver.  Notify all your caregivers, including dentists and eye doctors, about your condition. SEEK IMMEDIATE MEDICAL CARE IF:   You develop active bleeding from anywhere in your body.  You develop unexplained bruising or bleeding.  You have blood in your sputum, urine, or stool. MAKE SURE YOU:  Understand these instructions.  Will watch your condition.  Will get help right away if you are not doing well or get worse. Document Released: 07/16/2005 Document Revised: 10/08/2011 Document Reviewed: 05/18/2011 [facility] Patient Information 2014 [place], [place]. Anemia, Nonspecific Anemia is a condition in which the concentration of red blood cells or hemoglobin in the blood is below normal. Hemoglobin is a substance in red blood cells that carries oxygen to the tissues of the body. Anemia results in not enough oxygen reaching these tissues.  CAUSES  Common causes of anemia include:   Excessive bleeding. Bleeding may be internal or external. This includes excessive bleeding from periods (in women) or from the intestine.   Poor nutrition.  Chronic kidney, thyroid, and liver disease.  Bone marrow disorders that decrease red blood cell production.  Cancer and treatments for cancer.  HIV, AIDS, and their  treatments.  Spleen problems that increase red blood cell destruction.  Blood disorders.  Excess destruction of red blood cells due to infection, medicines, and autoimmune disorders. SIGNS AND SYMPTOMS   Minor weakness.   Dizziness.   Headache.  Palpitations.   Shortness of breath, especially with exercise.   Paleness.  Cold sensitivity.  Indigestion.  Nausea.  Difficulty sleeping.  Difficulty concentrating. Symptoms may occur suddenly or they may develop slowly.  DIAGNOSIS  Additional blood tests are often needed. These help your health care provider determine the best treatment. Your health care provider will check your stool for blood and look for other causes of blood loss.  TREATMENT  Treatment varies depending on the cause of the anemia. Treatment can include:   Supplements of iron, vitamin H96, or folic acid.   Hormone medicines.   A blood transfusion. This may be needed if blood loss is severe.   Hospitalization. This may be needed if there is significant continual blood loss.   Dietary changes.  Spleen removal. HOME CARE INSTRUCTIONS Keep all follow-up appointments. It often takes many weeks to correct anemia, and having your health care provider check on your condition and your response to treatment is very important. SEEK IMMEDIATE MEDICAL CARE IF:   You develop extreme weakness, shortness of breath, or chest pain.   You become dizzy or have trouble concentrating.  You develop heavy vaginal bleeding.   You develop a rash.   You have bloody or black, tarry stools.   You faint.   You vomit up blood.   You vomit repeatedly.   You have abdominal pain.  You have a fever or persistent symptoms for more than 2 3 days.   You have a fever and your symptoms suddenly get worse.   You are dehydrated.  MAKE SURE YOU:  Understand these instructions.  Will watch your condition.  Will get help right away if you are not doing  well or get worse. Document Released: 08/23/2004 Document Revised: 03/18/2013 Document Reviewed: 01/09/2013 [facility] Patient Information 2014 [place].

## 2013-09-11 LAB — ERYTHROPOIETIN: Erythropoietin: 88.5 m[IU]/mL — ABNORMAL HIGH (ref 2.6–18.5)

## 2013-09-11 LAB — HAPTOGLOBIN: HAPTOGLOBIN: 222 mg/dL — AB (ref 45–215)

## 2013-09-14 NOTE — Progress Notes (Signed)
[place] [phone]   [phone]) [phone]  NEW PATIENT EVALUATION   Name: [name] [name] Date: 09/14/2013 MRN: [id number] DOB: 11/12/1919  PCP: Estill Dooms, MD   REFERRING PHYSICIAN: Estill Dooms, MD  REASON FOR REFERRAL: Anemia    HISTORY OF PRESENT ILLNESS:[name] [name] is a 80 y.o. male who is being referred by Dr. Nyoka Cowden for evaluation of chronic anemia and thrombocytopenia. Today, the patient is accompanied by his wife.  He has dementia and provides limited history.   Of note, he has Chronic kidney disease with a creatinine clearance of 31.3 ml/min and he resides at SNF at [facility].  His recent anemia work-up revealed a Hgb of 8.3 on 09/02/2012; Normal iron 87, B12 740, Folate > 20.  He has not been on iron supplementation.  His hgb on 08/13/13 revealed a hgb of 8.0.  GI (Dr. Norberto Sorenson T. Dagoberto Ligas. MD) was consulted on 08/26/2013 for anemia.  Last upper endoscopy on 12/13/2011 revealed mild gastritis with recommendations for a PPI q am long term. The patient's review of systems for symptoms of anemia was negative.  He does have memory problems.   His WBC was normal at 4.2 on 09/08/13; Hemoglobin of 8.0; Hematocrit of 23.5; MCV was 113.5; Platelet count of 65.  He has hypothyroidism and is compliant with levothyroxine 75 mcg, TSH 1.894 on 06/30/2013.  Examining the plt trend revealed his thrombocytopenia started around 09/12/2011 following laparotomy by Dr. Lilyan Punt and repair of perforated ulcer.  He has since been on nexium 40 mg bid.  Of note he takes namenda for his dementia; lasix 20 mg daily for congestive heart failure; lantus for his diabetes.  He is on folic acid daily.    PAST MEDICAL HISTORY:  has a past medical history of Hypothyroidism; Hypertension; CHF (congestive heart failure); Diabetes mellitus; Coronary artery disease; Restless leg syndrome; Hyperlipemia; Renal disease; Sleep apnea; Cellulitis and abscess of leg; Obesity;  Duodenal ulcer, perforated; Diverticulosis; Internal hemorrhoids; CAD (coronary artery disease); Anemia; Duodenal ulcer; Allergy; Blood transfusion; Cataract; Tuberculosis; Contact dermatitis and other eczema, due to unspecified cause (10/06/2012); Senile dementia with depressive features (05/26/2012); Hyposmolality and/or hyponatremia (04/02/2012); Diarrhea (03/26/2012); Hemorrhage of rectum and anus (03/25/2012); Pneumonia, organism unspecified (03/12/2012); 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 (01/07/2012); Folate-deficiency anemia (01/07/2012); Thrombocytopenia, unspecified (09/26/2011); Loss of weight (09/11/2011); Dysphagia, unspecified(787.20) (06/28/2011); Chronic kidney disease, stage III (moderate) (03/22/2011); Cramp of limb (08/10/2010); Chronic kidney disease, stage II (mild) (03/22/2011); Personal history of fall (05/05/2009); Unspecified vitamin D deficiency (02/24/2009); Blood in stool (11/18/2008); Dermatophytosis of the body (06/23/2007); Obesity, unspecified (06/23/2007); Unspecified tinnitus (06/23/2007); Edema (06/23/2007); Shortness of breath (06/23/2007); Flatulence, eructation, and gas pain (06/23/2007); Other general symptoms(780.99) (06/23/2007); and Memory loss (08/13/2003).     PAST SURGICAL HISTORY: Past Surgical History  Procedure Laterality Date  . Laparotomy  09/12/2011    Procedure: EXPLORATORY LAPAROTOMY;  Surgeon: Judieth Keens, DO;  Location: [facility];  Service: General;  Laterality: N/A;  REPAIR PERFORATED ULCER  . Gastrostomy  09/12/2011    Procedure: GASTROSTOMY;  Surgeon: Judieth Keens, DO;  Location: [facility];  Service: General;  Laterality: N/A;  BIOPSY DUODENAL ULCER  . Shoulder surgery  [place], [place]  . Jejunostomy feeding tube      removed  . Back surgery  1942  . Eye surgery  [phone]    cataracts  . Coronary artery bypass graft  2201     CURRENT MEDICATIONS:  has a current medication list which includes the following  prescription(s): acetaminophen, calcium carbonate-vitamin d, chlorpheniramine-hydrocodone, cholecalciferol, duloxetine, fluticasone-salmeterol, folic acid, furosemide, guaifenesin, insulin aspart, insulin glargine, levothyroxine, memantine, metoprolol tartrate, fish oil, pantoprazole, protein supplement, tiotropium, and vitamin b-12.   ALLERGIES: Enalapril and Milk-related compounds   SOCIAL HISTORY:  reports that he has quit smoking. His smoking use included Cigarettes. He smoked 0.00 packs per day. He has never used smokeless tobacco. He reports that he does not drink alcohol or use illicit drugs.   FAMILY HISTORY: family history includes Stroke in his father.   LABORATORY DATA:  CBC    Component Value Date/Time   WBC 5.2 09/10/2013 1249   WBC 4.8 08/21/2013   WBC 5.6 09/24/2011 0810   RBC 2.31* 09/10/2013 1249   RBC 2.75* 09/24/2011 0810   HGB 8.7* 09/10/2013 1249   HGB 8.2* 08/21/2013   HCT 26.6* 09/10/2013 1249   HCT 24* 08/21/2013   PLT 70 Large platelets present* 09/10/2013 1249   PLT 56* 08/21/2013   MCV 115.2* 09/10/2013 1249   MCV 100.0 09/24/2011 0810   MCH 37.7* 09/10/2013 1249   MCH 34.5* 09/24/2011 0810   MCHC 32.7 09/10/2013 1249   MCHC 34.5 09/24/2011 0810   RDW 14.5 09/10/2013 1249   RDW 13.9 09/24/2011 0810   LYMPHSABS 1.6 09/10/2013 1249   LYMPHSABS 0.6* 09/12/2011 2230   MONOABS 0.4 09/10/2013 1249   MONOABS 0.2 09/12/2011 2230   EOSABS 0.1 09/10/2013 1249   EOSABS 0.0 09/12/2011 2230   BASOSABS 0.0 09/10/2013 1249   BASOSABS 0.0 09/12/2011 2230    CMP     Component Value Date/Time   NA 135* 09/10/2013 1249   NA 140 08/13/2013   NA 136 09/21/2011 0400   K 5.8* 09/10/2013 1249   K 4.7 08/13/2013   CL 99 09/21/2011 0400   CO2 23 09/10/2013 1249   CO2 30 09/21/2011 0400   GLUCOSE 165* 09/10/2013 1249   GLUCOSE 229* 09/21/2011 0400   BUN 41.4* 09/10/2013 1249   BUN 35* 08/13/2013   BUN 26* 09/21/2011 0400   CREATININE 1.8* 09/10/2013 1249   CREATININE 1.7* 08/13/2013   CREATININE  1.37* 09/21/2011 0400   CALCIUM 9.6 09/10/2013 1249   CALCIUM 7.8* 09/21/2011 0400   PROT 6.6 09/10/2013 1249   PROT 5.4* 09/13/2011 0345   ALBUMIN 3.3* 09/10/2013 1249   ALBUMIN 2.2* 09/13/2011 0345   AST 11 09/10/2013 1249   AST 9* 06/30/2013   ALT 10 09/10/2013 1249   ALT 8* 06/30/2013   ALKPHOS 84 09/10/2013 1249   ALKPHOS 66 06/30/2013   BILITOT 0.28 09/10/2013 1249   BILITOT 0.3 09/13/2011 0345   GFRNONAA 44* 09/21/2011 0400   GFRAA 51* 09/21/2011 0400   Results for BROADY, LAFOY (MRN [id number]) as of 09/14/2013 06:10  Ref. Range 09/10/2013 12:49  Iron Latest Range: 42-163 ug/dL 96  UIBC Latest Range: 117-376 ug/dL 146  TIBC Latest Range: 202-409 ug/dL 243  %SAT Latest Range: 20-55 % 40  Ferritin Latest Range: 22-316 ng/ml 341 (H)   Results for RISHAAN, GUNNER (MRN [id number]) as of 09/14/2013 06:10  Ref. Range 09/10/2013 12:49  Smear Result No range found Smear Available  Retic % Latest Range: 0.80-1.80 % 2.92 (H)  Retic Ct Abs Latest Range: 34.80-93.90 10e3/uL 67.45   Results for WAVERLY, CHAVARRIA (MRN [id number]) as of 09/14/2013 06:10  Ref. Range 09/10/2013 12:49  Immature Retic Fract Latest Range: 3.00-10.60 % 20.50 (H)    RADIOGRAPHY: No results  found.     REVIEW OF SYSTEMS:  (Limited due to patient's dementia).  Constitutional: Denies fevers, chills or abnormal weight loss Eyes: Denies blurriness of vision Ears, nose, mouth, throat, and face: Denies mucositis or sore throat Respiratory: Denies cough, dyspnea or wheezes Cardiovascular: Denies palpitation, chest discomfort or lower extremity swelling Gastrointestinal:  Denies nausea, heartburn or change in bowel habits Skin: Denies abnormal skin rashes Lymphatics: Denies new lymphadenopathy or easy bruising Neurological:Denies numbness, tingling or new weaknesses Behavioral/Psych: Mood is stable, no new changes  All other systems were reviewed with the patient and are negative.  PHYSICAL EXAM:  height is _0  (1.727 m)  and weight is 229 lb 12.8 oz (104.237 kg). His oral temperature is 97.6 F (36.4 C). His blood pressure is 115/53 and his pulse is 90. His respiration is 18 and oxygen saturation is 97%.    GENERAL:alert, no distress and comfortable; obese sitting in wheelchair.  SKIN: skin color, texture, turgor are normal, no rashes or significant lesions EYES: normal, Conjunctiva are pink and non-injected, sclera clear OROPHARYNX:no exudate, no erythema and lips, buccal mucosa, and tongue normal  NECK: supple, thyroid normal size, non-tender, without nodularity LYMPH:  no palpable lymphadenopathy in the cervical, axillary or inguinal LUNGS: Rales in the right lower field and left lower field. and percussion with normal breathing effort HEART: regular rate & rhythm and no murmurs and 1+ pitting edema ABDOMEN:abdomen soft, non-tender and normal bowel sounds Musculoskeletal:no cyanosis of digits and no clubbing  NEURO: alert & oriented x 3 with fluent speech, no focal motor/sensory deficits   IMPRESSION: [name] [name] is a 80 y.o. male with a history of    PLAN:  1.  Chronic anemia. --It is megablastic, hypoproliferative anemia.  He had a normal MCV prior to lap secondary to perforated ulcer.  His MCV was normocytic on 09/11/2012 prior to procedure. He is maintained on folic with normal folate and vitamin B-12 levels.  In addition, his TSH is normal and he has normal liver function.  Other considerations would be MDS, MM or other primary bone marrow bleedings.   In addition, he could have some anemia related to his chronic kidney disease with creatinine clearance of 31.3.  His EPO level is low for the degree of his anemia.  We could start aranesp 300 mcg monthly on his return visit with a goal hemoglobin greater than 10.   Given his advanced age and co-morbidities, he was less inclined to obtain a bone marrow biopsy at this point.    2. Thrombocytopenia.  --Only thrombocytopenia-inducing medication that he  is on presently is nexium.  I recommend a trial off nexium to rule out it as the cause of his thrombocytopenia.  If plts still are decreased, might consider heparin induced thrombocytopenia work-up since it occurred s/p hospitalizations.  The patient has no clots or bruising.   3. Follow-up. --He will follow up with [place] in two weeks to start aranesp 300 mcg monthly and discuss the anemia work up.    All questions were answered. The patient knows to call the clinic with any problems, questions or concerns. We can certainly see the patient much sooner if necessary.  I spent 25 minutes counseling the patient face to face. The total time spent in the appointment was 45 minutes.    Emonee Winkowski, MD 09/14/2013 5:57 AM

## 2013-09-23 ENCOUNTER — Ambulatory Visit (HOSPITAL_BASED_OUTPATIENT_CLINIC_OR_DEPARTMENT_OTHER): Payer: Medicare Other

## 2013-09-23 ENCOUNTER — Telehealth: Payer: Self-pay | Admitting: Internal Medicine

## 2013-09-23 ENCOUNTER — Ambulatory Visit (HOSPITAL_BASED_OUTPATIENT_CLINIC_OR_DEPARTMENT_OTHER): Payer: Medicare Other | Admitting: Internal Medicine

## 2013-09-23 ENCOUNTER — Encounter: Payer: Self-pay | Admitting: Medical Oncology

## 2013-09-23 VITALS — BP 130/68 | HR 70 | Temp 96.9°F | Resp 17 | Ht 68.0 in | Wt 231.1 lb

## 2013-09-23 DIAGNOSIS — N189 Chronic kidney disease, unspecified: Secondary | ICD-10-CM

## 2013-09-23 DIAGNOSIS — D696 Thrombocytopenia, unspecified: Secondary | ICD-10-CM

## 2013-09-23 DIAGNOSIS — D631 Anemia in chronic kidney disease: Secondary | ICD-10-CM

## 2013-09-23 DIAGNOSIS — N039 Chronic nephritic syndrome with unspecified morphologic changes: Secondary | ICD-10-CM

## 2013-09-23 DIAGNOSIS — D649 Anemia, unspecified: Secondary | ICD-10-CM

## 2013-09-23 LAB — CBC WITH DIFFERENTIAL/PLATELET
BASO%: 0.2 % (ref 0.0–2.0)
Basophils Absolute: 0 10*3/uL (ref 0.0–0.1)
EOS ABS: 0.1 10*3/uL (ref 0.0–0.5)
EOS%: 2.5 % (ref 0.0–7.0)
HEMATOCRIT: 25.8 % — AB (ref 38.4–49.9)
HGB: 8.4 g/dL — ABNORMAL LOW (ref 13.0–17.1)
LYMPH%: 32.1 % (ref 14.0–49.0)
MCH: 37.7 pg — ABNORMAL HIGH (ref 27.2–33.4)
MCHC: 32.6 g/dL (ref 32.0–36.0)
MCV: 115.7 fL — ABNORMAL HIGH (ref 79.3–98.0)
MONO#: 0.4 10*3/uL (ref 0.1–0.9)
MONO%: 6.7 % (ref 0.0–14.0)
NEUT%: 58.5 % (ref 39.0–75.0)
NEUTROS ABS: 3.1 10*3/uL (ref 1.5–6.5)
NRBC: 0 % (ref 0–0)
Platelets: 61 10*3/uL — ABNORMAL LOW (ref 140–400)
RBC: 2.23 10*6/uL — AB (ref 4.20–5.82)
RDW: 14.4 % (ref 11.0–14.6)
WBC: 5.2 10*3/uL (ref 4.0–10.3)
lymph#: 1.7 10*3/uL (ref 0.9–3.3)

## 2013-09-23 MED ORDER — DARBEPOETIN ALFA-POLYSORBATE 300 MCG/0.6ML IJ SOLN
300.0000 ug | Freq: Once | INTRAMUSCULAR | Status: AC
  Administered 2013-09-23: 300 ug via SUBCUTANEOUS
  Filled 2013-09-23: qty 0.6

## 2013-09-23 NOTE — Progress Notes (Signed)
Per Dannielle Huh pt has insurance coverage to receive Aranesp.

## 2013-09-23 NOTE — Telephone Encounter (Signed)
Gave pt appt for lab and [facility] 2015, sent pt to labs today

## 2013-09-23 NOTE — Patient Instructions (Signed)
Darbepoetin Alfa injection What is this medicine? DARBEPOETIN ALFA (dar be POE e tin AL fa) helps your body make more red blood cells. It is used to treat anemia caused by chronic kidney failure and chemotherapy. This medicine may be used for other purposes; ask your health care provider or pharmacist if you have questions. COMMON BRAND NAME(S): Aranesp What should I tell my health care provider before I take this medicine? They need to know if you have any of these conditions: -blood clotting disorders or history of blood clots -cancer patient not on chemotherapy -cystic fibrosis -heart disease, such as angina, heart failure, or a history of a heart attack -hemoglobin level of 12 g/dL or greater -high blood pressure -low levels of folate, iron, or vitamin B12 -seizures -an unusual or allergic reaction to darbepoetin, erythropoietin, albumin, hamster proteins, latex, other medicines, foods, dyes, or preservatives -pregnant or trying to get pregnant -breast-feeding How should I use this medicine? This medicine is for injection into a vein or under the skin. It is usually given by a health care professional in a hospital or clinic setting. If you get this medicine at home, you will be taught how to prepare and give this medicine. Do not shake the solution before you withdraw a dose. Use exactly as directed. Take your medicine at regular intervals. Do not take your medicine more often than directed. It is important that you put your used needles and syringes in a special sharps container. Do not put them in a trash can. If you do not have a sharps container, call your pharmacist or healthcare provider to get one. Talk to your pediatrician regarding the use of this medicine in children. While this medicine may be used in children as young as 1 year for selected conditions, precautions do apply. Overdosage: If you think you have taken too much of this medicine contact a poison control center or  emergency room at once. NOTE: This medicine is only for you. Do not share this medicine with others. What if I miss a dose? If you miss a dose, take it as soon as you can. If it is almost time for your next dose, take only that dose. Do not take double or extra doses. What may interact with this medicine? Do not take this medicine with any of the following medications: -epoetin alfa This list may not describe all possible interactions. Give your health care provider a list of all the medicines, herbs, non-prescription drugs, or dietary supplements you use. Also tell them if you smoke, drink alcohol, or use illegal drugs. Some items may interact with your medicine. What should I watch for while using this medicine? Visit your prescriber or health care professional for regular checks on your progress and for the needed blood tests and blood pressure measurements. It is especially important for the doctor to make sure your hemoglobin level is in the desired range, to limit the risk of potential side effects and to give you the best benefit. Keep all appointments for any recommended tests. Check your blood pressure as directed. Ask your doctor what your blood pressure should be and when you should contact him or her. As your body makes more red blood cells, you may need to take iron, folic acid, or vitamin B supplements. Ask your doctor or health care provider which products are right for you. If you have kidney disease continue dietary restrictions, even though this medication can make you feel better. Talk with your doctor or health   care professional about the foods you eat and the vitamins that you take. What side effects may I notice from receiving this medicine? Side effects that you should report to your doctor or health care professional as soon as possible: -allergic reactions like skin rash, itching or hives, swelling of the face, lips, or tongue -breathing problems -changes in vision -chest  pain -confusion, trouble speaking or understanding -feeling faint or lightheaded, falls -high blood pressure -muscle aches or pains -pain, swelling, warmth in the leg -rapid weight gain -severe headaches -sudden numbness or weakness of the face, arm or leg -trouble walking, dizziness, loss of balance or coordination -seizures (convulsions) -swelling of the ankles, feet, hands -unusually weak or tired Side effects that usually do not require medical attention (report to your doctor or health care professional if they continue or are bothersome): -diarrhea -fever, chills (flu-like symptoms) -headaches -nausea, vomiting -redness, stinging, or swelling at site where injected This list may not describe all possible side effects. Call your doctor for medical advice about side effects. You may report side effects to FDA at 1-800-FDA-1088. Where should I keep my medicine? Keep out of the reach of children. Store in a refrigerator between 2 and 8 degrees C (36 and 46 degrees F). Do not freeze. Do not shake. Throw away any unused portion if using a single-dose vial. Throw away any unused medicine after the expiration date. NOTE: This sheet is a summary. It may not cover all possible information. If you have questions about this medicine, talk to your doctor, pharmacist, or health care provider.  2014, [employer]. (2008-06-29 10:23:57)  

## 2013-09-24 NOTE — Progress Notes (Signed)
[place] OFFICE PROGRESS NOTE  GREEN, [name] [name], MD [address] [place] [address]  DIAGNOSIS: CKD (chronic kidney disease) - Plan: CBC with Differential, Basic metabolic panel (Bmet) - [facility], CBC with Differential, darbepoetin (ARANESP) injection 300 mcg, CBC with Differential  Anemia - Plan: CBC with Differential, Basic metabolic panel (Bmet) - [facility], CBC with Differential, darbepoetin (ARANESP) injection 300 mcg, CBC with Differential  Chief Complaint  Patient presents with  . Anemia in chronic kidney disease(285.21)    CURRENT TREATMENT:  Aranesp 300 mcg every 4 weeks for hemoglobin less than 11.   INTERVAL HISTORY: [name] [name] 80 y.o. male who was referred by Dr. Nyoka Cowden for evaluation of chronic anemia and thrombocytopenia and evaluated by me on 09/10/2013. Today, the patient is accompanied by his wife. He has dementia and provides limited history. Of note, he has Chronic kidney disease with a creatinine clearance of 31.3 ml/min and he resides at SNF at [facility].   As previously reported, his recent anemia work-up revealed a Hgb of 8.3 on 09/02/2012; Normal iron 87, B12 740, Folate > 20. He has not been on iron supplementation. His hgb on 08/13/13 revealed a hgb of 8.0. GI (Dr. Norberto Sorenson T. Dagoberto Ligas. MD) was consulted on 08/26/2013 for anemia. Last upper endoscopy on 12/13/2011 revealed mild gastritis with recommendations for a PPI q am long term. The patient's review of systems for symptoms of anemia was negative. He does have memory problems.   His WBC was normal at 4.2 on 09/08/13; Hemoglobin of 8.0; Hematocrit of 23.5; MCV was 113.5; Platelet count of 65. He has hypothyroidism and is compliant with levothyroxine 75 mcg, TSH 1.894 on 06/30/2013. Examining the plt trend revealed his thrombocytopenia started around 09/12/2011 following laparotomy by Dr. Lilyan Punt and repair of perforated ulcer. He has since been on nexium 40 mg bid. Of note he takes namenda  for his dementia; lasix 20 mg daily for congestive heart failure; lantus for his diabetes. He is on folic acid daily.   MEDICAL HISTORY: Past Medical History  Diagnosis Date  . Hypothyroidism   . Hypertension   . CHF (congestive heart failure)   . Diabetes mellitus   . Coronary artery disease   . Restless leg syndrome   . Hyperlipemia   . Renal disease     stage II  . Sleep apnea   . Cellulitis and abscess of leg   . Obesity   . Duodenal ulcer, perforated   . Diverticulosis   . Internal hemorrhoids   . CAD (coronary artery disease)   . Anemia   . Duodenal ulcer   . Allergy   . Blood transfusion   . Cataract   . Tuberculosis     as a child  . Contact dermatitis and other eczema, due to unspecified cause 10/06/2012  . Senile dementia with depressive features 05/26/2012  . Hyposmolality and/or hyponatremia 04/02/2012  . Diarrhea 03/26/2012  . Hemorrhage of rectum and anus 03/25/2012  . Pneumonia, organism unspecified 03/12/2012  . 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 Folate-deficiency anemia 01/07/2012  . Thrombocytopenia, unspecified 09/26/2011  . Loss of weight 09/11/2011  . Dysphagia, unspecified(787.20) 06/28/2011  . Chronic kidney disease, stage III (moderate) 03/22/2011  . Cramp of limb 08/10/2010  . Chronic kidney disease, stage II (mild) 03/22/2011  . Personal history of fall 05/05/2009  . Unspecified vitamin D deficiency 02/24/2009  . Blood in stool 11/18/2008  . Dermatophytosis of the body  06/23/2007  . Obesity, unspecified 06/23/2007  . Unspecified tinnitus 06/23/2007  . Edema 06/23/2007  . Shortness of breath 06/23/2007  . Flatulence, eructation, and gas pain 06/23/2007  . Other general symptoms(780.99) 06/23/2007  . Memory loss 08/13/2003    INTERIM HISTORY: has UNSPECIFIED ANEMIA; BLOOD IN STOOL; Duodenal ulcer; CHF (congestive heart failure); CKD (chronic kidney disease); Shock; Respiratory failure; Diabetes mellitus; Fall  at nursing home; Depression; Dementia; Essential hypertension, benign; Unspecified hypothyroidism; Anemia; Nausea and vomiting in adult patient; Unspecified gastritis and gastroduodenitis without mention of hemorrhage; Diarrhea; Urinary tract infection, site not specified; Cellulitis and abscess of leg; Acute bronchitis; Cough; and Thrombocytopenia, unspecified on his problem list.    ALLERGIES:  is allergic to enalapril and milk-related compounds.  MEDICATIONS: has a current medication list which includes the following prescription(s): calcium carbonate-vitamin d, cholecalciferol, duloxetine, fluticasone-salmeterol, folic acid, furosemide, guaifenesin, insulin aspart, insulin glargine, levothyroxine, memantine, metoprolol tartrate, fish oil, pantoprazole, protein supplement, tiotropium, vitamin b-12, and acetaminophen.  SURGICAL HISTORY:  Past Surgical History  Procedure Laterality Date  . Laparotomy  09/12/2011    Procedure: EXPLORATORY LAPAROTOMY;  Surgeon: Judieth Keens, DO;  Location: [facility];  Service: General;  Laterality: N/A;  REPAIR PERFORATED ULCER  . Gastrostomy  09/12/2011    Procedure: GASTROSTOMY;  Surgeon: Judieth Keens, DO;  Location: [facility];  Service: General;  Laterality: N/A;  BIOPSY DUODENAL ULCER  . Shoulder surgery  [place], [place]  . Jejunostomy feeding tube      removed  . Back surgery  1942  . Eye surgery  [phone]    cataracts  . Coronary artery bypass graft  2201    REVIEW OF SYSTEMS:   Constitutional: Denies fevers, chills or abnormal weight loss Eyes: Denies blurriness of vision Ears, nose, mouth, throat, and face: Denies mucositis or sore throat Respiratory: Denies cough, dyspnea or wheezes Cardiovascular: Denies palpitation, chest discomfort or lower extremity swelling Gastrointestinal:  Denies nausea, heartburn or change in bowel habits Skin: Denies abnormal skin rashes Lymphatics: Denies new lymphadenopathy or easy  bruising Neurological:Denies numbness, tingling or new weaknesses; + Memory problems Behavioral/Psych: Mood is stable, no new changes  All other systems were reviewed with the patient and are negative.  PHYSICAL EXAMINATION: ECOG PERFORMANCE STATUS: 2 - Symptomatic, <50% confined to bed  Blood pressure 130/68, pulse 70, temperature 96.9 F (36.1 C), temperature source Oral, resp. rate 17, height _0  (1.727 m), weight 231 lb 1.6 oz (104.826 kg), SpO2 95.00%.  GENERAL:alert, no distress and comfortable; obese sitting in wheelchair.  SKIN: skin color, texture, turgor are normal, no rashes or significant lesions  EYES: normal, Conjunctiva are pink and non-injected, sclera clear  OROPHARYNX:no exudate, no erythema and lips, buccal mucosa, and tongue normal  NECK: supple, thyroid normal size, non-tender, without nodularity  LYMPH: no palpable lymphadenopathy in the cervical, axillary or inguinal  LUNGS: Rales in the right lower field and left lower field. and percussion with normal breathing effort  HEART: regular rate & rhythm and no murmurs and 1+ pitting edema  ABDOMEN:abdomen soft, non-tender and normal bowel sounds  Musculoskeletal:no cyanosis of digits and no clubbing  NEURO: alert & oriented x 3 with fluent speech, no focal motor/sensory deficits   Labs:  Lab Results  Component Value Date   WBC 5.2 09/23/2013   HGB 8.4* 09/23/2013   HCT 25.8* 09/23/2013   MCV 115.7* 09/23/2013   PLT 61* 09/23/2013   NEUTROABS 3.1 09/23/2013      Chemistry  Component Value Date/Time   NA 135* 09/10/2013 1249   NA 140 08/13/2013   NA 136 09/21/2011 0400   K 5.8* 09/10/2013 1249   K 4.7 08/13/2013   CL 99 09/21/2011 0400   CO2 23 09/10/2013 1249   CO2 30 09/21/2011 0400   BUN 41.4* 09/10/2013 1249   BUN 35* 08/13/2013   BUN 26* 09/21/2011 0400   CREATININE 1.8* 09/10/2013 1249   CREATININE 1.7* 08/13/2013   CREATININE 1.37* 09/21/2011 0400   GLU 77 08/13/2013      Component Value Date/Time    CALCIUM 9.6 09/10/2013 1249   CALCIUM 7.8* 09/21/2011 0400   ALKPHOS 84 09/10/2013 1249   ALKPHOS 66 06/30/2013   AST 11 09/10/2013 1249   AST 9* 06/30/2013   ALT 10 09/10/2013 1249   ALT 8* 06/30/2013   BILITOT 0.28 09/10/2013 1249   BILITOT 0.3 09/13/2011 0345     CBC:  Recent Labs Lab 09/23/13 1354  WBC 5.2  NEUTROABS 3.1  HGB 8.4*  HCT 25.8*  MCV 115.7*  PLT 61*    Studies:  No results found.   RADIOGRAPHIC STUDIES: No results found.  ASSESSMENT: [name] [name] 80 y.o. male with a history of CKD (chronic kidney disease) - Plan: CBC with Differential, Basic metabolic panel (Bmet) - [facility], CBC with Differential, darbepoetin (ARANESP) injection 300 mcg, CBC with Differential  Anemia - Plan: CBC with Differential, Basic metabolic panel (Bmet) - [facility], CBC with Differential, darbepoetin (ARANESP) injection 300 mcg, CBC with Differential   PLAN:   1. Anemia of mixed etiology including anemia of chronic kidney disease.  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 He had a normal MCV prior to lap secondary to perforated ulcer. His MCV was normocytic on 09/11/2012 prior to procedure. He is maintained on folic with normal folate and vitamin B-12 levels. In addition, his TSH is normal and he has normal liver function. Other considerations would be MDS, MM or other primary bone marrow bleedings. In addition, he could have some anemia related to his chronic kidney disease with creatinine clearance of 31.3. His EPO level is low for the degree of his anemia. Thus, we will start aranesp 300 mcg monthly today with a goal hemoglobin greater than 10.  Patient and wife was agreeable.  We explained risks including but not limited to clots including MI, DVT, HTN, CVA or infections, n/v, diarrhea.  Benefits may be to improve his hemoglobin and thereby symptoms of anemia such as fatigue.   Given his advanced age and co-morbidities, he was less inclined to obtain a bone marrow biopsy at this point.    2. CKDz. --His creatinine is 1.8-2.0 with creatinine clearance of 31.4 mL/min.  Avoid nephrotoxins.    3. Thrombocytopenia.  --Only thrombocytopenia-inducing medication that he is on presently is nexium. I recommend decreasing his nexium to once a day due to it can cause of his thrombocytopenia.  4. Follow-up.  --He will follow up with [place] in two weeks to start aranesp 300 mcg monthly and discuss the anemia work up.   All questions were answered. The patient knows to call the clinic with any problems, questions or concerns. We can certainly see the patient much sooner if necessary.  I spent 15 minutes counseling the patient face to face. The total time spent in the appointment was 25 minutes.    Estell Dillinger, MD 09/24/2013 8:25 AM

## 2013-10-05 ENCOUNTER — Non-Acute Institutional Stay (SKILLED_NURSING_FACILITY): Payer: Medicare Other | Admitting: Nurse Practitioner

## 2013-10-05 ENCOUNTER — Encounter: Payer: Self-pay | Admitting: Nurse Practitioner

## 2013-10-05 DIAGNOSIS — E039 Hypothyroidism, unspecified: Secondary | ICD-10-CM

## 2013-10-05 DIAGNOSIS — F3289 Other specified depressive episodes: Secondary | ICD-10-CM

## 2013-10-05 DIAGNOSIS — N189 Chronic kidney disease, unspecified: Secondary | ICD-10-CM

## 2013-10-05 DIAGNOSIS — D649 Anemia, unspecified: Secondary | ICD-10-CM

## 2013-10-05 DIAGNOSIS — F0390 Unspecified dementia without behavioral disturbance: Secondary | ICD-10-CM

## 2013-10-05 DIAGNOSIS — F32A Depression, unspecified: Secondary | ICD-10-CM

## 2013-10-05 DIAGNOSIS — I10 Essential (primary) hypertension: Secondary | ICD-10-CM

## 2013-10-05 DIAGNOSIS — I509 Heart failure, unspecified: Secondary | ICD-10-CM

## 2013-10-05 DIAGNOSIS — E119 Type 2 diabetes mellitus without complications: Secondary | ICD-10-CM

## 2013-10-05 DIAGNOSIS — F329 Major depressive disorder, single episode, unspecified: Secondary | ICD-10-CM

## 2013-10-05 NOTE — Assessment & Plan Note (Signed)
09/23/13-Darbepoetin Alfa inj [facility]. Dx anemia in chronic kidney disease.

## 2013-10-05 NOTE — Progress Notes (Signed)
Patient ID: [name] [name], male   DOB: March 18, 1920, 80 y.o.   MRN: [id number]   Code Status: DNR  Allergies  Allergen Reactions  . Enalapril Other (See Comments)    cough  . Milk-Related Compounds Other (See Comments)    Doesn't remember but its a mild reaction    Chief Complaint  Patient presents with  . Medical Managment of Chronic Issues    HPI: Patient is a 80 y.o. male seen in the SNF at [facility] today for evaluation of anemia of CKD, blood pressure,  CKD,  and other chronic medical conditions.  Problem List Items Addressed This Visit   Unspecified hypothyroidism     Corrected with Levothyroxine 80mcg, TSH 1.894 06/30/13     Essential hypertension, benign     Controlled on Metoprolol 25mg  bid. off Losartan due to CKD. Update BMP     Diabetes mellitus (Chronic)     Hgb A1c 7.2 3/27/149(was 7.4 07/22/12)-6.8 02/05/13. Takes Lantus 35units, Novolog 5 units with meals.        Depression     takes Cymbalta 30mg (was reduced from 60mg  due to his falling). Stable.       Dementia     Stable on Namenda      CKD (chronic kidney disease)     Persisted with Bun 46, creatinine 1.97, K 5.3--improved K 4.7, Bun 35, creatinine 1.67 08/13/13 since off Losartan and started Metoprolol. Update BMP       CHF (congestive heart failure)     Well compensated on Furosemide 20mg , trace BLE edema-chronic. CXR 06/29/13 showed no pulmonary vascular congestion.        Anemia - Primary     09/23/13-Darbepoetin Alfa inj [facility]. Dx anemia in chronic kidney disease.        Review of Systems:  Review of Systems  Constitutional: Negative for fever, chills, weight loss, malaise/fatigue and diaphoresis.  HENT: Positive for hearing loss. Negative for congestion, ear pain and sore throat.   Eyes: Negative for pain, discharge and redness.  Respiratory: Positive for cough and sputum production. Negative for shortness of breath and wheezing.        Clear sputum.  Associated with swallowing at times.  Cardiovascular: Positive for leg swelling (trace). Negative for chest pain, palpitations, orthopnea, claudication and PND.  Gastrointestinal: Negative for heartburn, abdominal pain, diarrhea, constipation and blood in stool.  Genitourinary: Positive for frequency. Negative for dysuria, urgency, hematuria and flank pain.  Musculoskeletal: Positive for back pain and joint pain. Negative for myalgias and neck pain.  Skin: Negative for itching and rash.       Small open area right buttock.   Neurological: Negative for dizziness, tingling, tremors, sensory change, speech change, focal weakness, seizures, loss of consciousness, weakness and headaches.  Endo/Heme/Allergies: Negative for environmental allergies and polydipsia. Does not bruise/bleed easily.  Psychiatric/Behavioral: Positive for memory loss. Negative for depression and hallucinations. The patient is nervous/anxious. The patient does not have insomnia.      Past Medical History  Diagnosis Date  . Hypothyroidism   . Hypertension   . CHF (congestive heart failure)   . Diabetes mellitus   . Coronary artery disease   . Restless leg syndrome   . Hyperlipemia   . Renal disease     stage II  . Sleep apnea   . Cellulitis and abscess of leg   . Obesity   . Duodenal ulcer, perforated   . Diverticulosis   . Internal hemorrhoids   .  CAD (coronary artery disease)   . Anemia   . Duodenal ulcer   . Allergy   . Blood transfusion   . Cataract   . Tuberculosis     as a child  . Contact dermatitis and other eczema, due to unspecified cause 10/06/2012  . Senile dementia with depressive features 05/26/2012  . Hyposmolality and/or hyponatremia 04/02/2012  . Diarrhea 03/26/2012  . Hemorrhage of rectum and anus 03/25/2012  . Pneumonia, organism unspecified 03/12/2012  . 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 Folate-deficiency anemia 01/07/2012  . Thrombocytopenia, unspecified  09/26/2011  . Loss of weight 09/11/2011  . Dysphagia, unspecified(787.20) 06/28/2011  . Chronic kidney disease, stage III (moderate) 03/22/2011  . Cramp of limb 08/10/2010  . Chronic kidney disease, stage II (mild) 03/22/2011  . Personal history of fall 05/05/2009  . Unspecified vitamin D deficiency 02/24/2009  . Blood in stool 11/18/2008  . Dermatophytosis of the body 06/23/2007  . Obesity, unspecified 06/23/2007  . Unspecified tinnitus 06/23/2007  . Edema 06/23/2007  . Shortness of breath 06/23/2007  . Flatulence, eructation, and gas pain 06/23/2007  . Other general symptoms(780.99) 06/23/2007  . Memory loss 08/13/2003   Past Surgical History  Procedure Laterality Date  . Laparotomy  09/12/2011    Procedure: EXPLORATORY LAPAROTOMY;  Surgeon: Judieth Keens, DO;  Location: [facility];  Service: General;  Laterality: N/A;  REPAIR PERFORATED ULCER  . Gastrostomy  09/12/2011    Procedure: GASTROSTOMY;  Surgeon: Judieth Keens, DO;  Location: [facility];  Service: General;  Laterality: N/A;  BIOPSY DUODENAL ULCER  . Shoulder surgery  [place], [place]  . Jejunostomy feeding tube      removed  . Back surgery  1942  . Eye surgery  [phone]    cataracts  . Coronary artery bypass graft  2201   Social History:   reports that he has quit smoking. His smoking use included Cigarettes. He smoked 0.00 packs per day. He has never used smokeless tobacco. He reports that he does not drink alcohol or use illicit drugs.  Family History  Problem Relation Age of Onset  . Stroke Father     Medications: Patient's Medications  New Prescriptions   No medications on file  Previous Medications   ACETAMINOPHEN (TYLENOL) 325 MG TABLET    Take 650 mg by mouth every 4 (four) hours as needed.   CALCIUM CARBONATE-VITAMIN D (CALCIUM 600+D) 600-400 MG-UNIT PER TABLET    Take 1 tablet by mouth daily.   CHOLECALCIFEROL (VITAMIN D) 1000 UNITS TABLET    Take 1,000 Units by mouth daily. Take 1 tablet  daily for vitamin D supplement.   DULOXETINE (CYMBALTA) 30 MG CAPSULE    Take 30 mg by mouth daily.   FLUTICASONE-SALMETEROL (ADVAIR HFA) 45-21 MCG/ACT INHALER    Inhale 2 puffs into the lungs 2 (two) times daily.   FOLIC ACID (FOLVITE) 1 MG TABLET       FUROSEMIDE (LASIX) 20 MG TABLET    Take 20 mg by mouth daily.    GUAIFENESIN (ROBITUSSIN) 100 MG/5ML LIQUID    Take 200 mg by mouth every 6 (six) hours as needed for cough.   INSULIN ASPART (NOVOLOG) 100 UNIT/ML INJECTION    Inject 5 Units into the skin 3 (three) times daily before meals. For CBG>150   INSULIN GLARGINE (LANTUS) 100 UNIT/ML INJECTION    Inject 35 Units into the skin at bedtime.    LEVOTHYROXINE (SYNTHROID, LEVOTHROID) 75 MCG  TABLET    Take 75 mcg by mouth daily. Take 1 tablet daily  for thyroid supplement.   MEMANTINE (NAMENDA) 10 MG TABLET    Take 28 mg by mouth daily. Take 1 tablet twice daily for dementia/Alzheimer's disease.   METOPROLOL TARTRATE (LOPRESSOR) 25 MG TABLET    Take 25 mg by mouth 2 (two) times daily.   OMEGA-3 FATTY ACIDS (FISH OIL) 1200 MG CAPS    Take 1 capsule by mouth daily.   PANTOPRAZOLE (PROTONIX) 40 MG TABLET    Take 40 mg by mouth 2 (two) times daily. Take 1 tablet twice daily as needed to reduce stomach acid.   PROTEIN SUPPLEMENT (RESOURCE BENEPROTEIN) POWD    Take 1 scoop by mouth 2 (two) times daily.   TIOTROPIUM (SPIRIVA) 18 MCG INHALATION CAPSULE    Place 18 mcg into inhaler and inhale daily. 2 puffs daily   VITAMIN B-12 (CYANOCOBALAMIN) 1000 MCG TABLET    Take 1,000 mcg by mouth daily.  Modified Medications   No medications on file  Discontinued Medications   No medications on file     Physical Exam: Physical Exam  Constitutional: He is oriented to person, place, and time. He appears well-developed and well-nourished. No distress.  HENT:  Head: Normocephalic and atraumatic.  Right Ear: External ear normal.  Left Ear: External ear normal.  Nose: Nose normal.  Mouth/Throat: Oropharynx is  clear and moist. No oropharyngeal exudate.  Eyes: Conjunctivae and EOM are normal. Pupils are equal, round, and reactive to light.  Neck: Normal range of motion. Neck supple. No JVD present. No thyromegaly present.  Cardiovascular: Normal rate, regular rhythm and normal heart sounds.   No murmur heard. Pulmonary/Chest: Effort normal. No respiratory distress. He has no wheezes. He has rales in the right lower field and the left lower field. He exhibits no tenderness.  Bibasilar as prior.   Abdominal: Soft. Bowel sounds are normal. He exhibits no distension and no mass. There is no tenderness. There is no rebound.  Musculoskeletal: Normal range of motion. He exhibits edema (trace in BLE).  Lymphadenopathy:    He has no cervical adenopathy.  Neurological: He is alert and oriented to person, place, and time. He has normal reflexes. No cranial nerve deficit. Coordination normal.  Skin: Skin is warm and dry. No rash noted. He is not diaphoretic. No erythema.  Small open area right buttock.  Psychiatric: He has a normal mood and affect. His behavior is normal. Judgment and thought content normal.    Filed Vitals:   10/05/13 1653  BP: 118/60  Pulse: 80  Temp: 98.4 F (36.9 C)  TempSrc: Tympanic  Resp: 20      Labs reviewed: Basic Metabolic Panel:  Recent Labs  10/23/12  04/14/13  06/30/13 08/13/13 09/10/13 1249  NA 133*  < > 137  < > 142 140 135*  K 4.6  < > 4.8  < > 5.3 4.7 5.8*  CO2  --   --   --   --   --   --  23  GLUCOSE  --   --   --   --   --   --  165*  BUN 36*  < > 34*  < > 46* 35* 41.4*  CREATININE 1.5*  < > 1.8*  < > 2.0* 1.7* 1.8*  CALCIUM  --   --   --   --   --   --  9.6  TSH 1.76  --  2.92  --  1.89  --   --   < > = values in this interval not displayed. Liver Function Tests:  Recent Labs  04/14/13 06/30/13 09/10/13 1249  AST 10* 9* 11  ALT 8* 8* 10  ALKPHOS 62 66 84  BILITOT  --   --  0.28  PROT  --   --  6.6  ALBUMIN  --   --  3.3*   CBC:  Recent  Labs  09/10/13 1249 09/23/13 1354  WBC 5.2 5.2  NEUTROABS 3.1 3.1  HGB 8.7* 8.4*  HCT 26.6* 25.8*  MCV 115.2* 115.7*  PLT 70 Large platelets present* 61*   Past Procedures:  CXR 06/29/13 showed there is mild patchy atelectasis or interstitial pneumonitis at the right lung base appearing significantly improved.   Assessment/Plan Anemia 09/23/13-Darbepoetin Alfa inj [facility]. Dx anemia in chronic kidney disease.   Unspecified hypothyroidism Corrected with Levothyroxine 75mcg, TSH 1.894 06/30/13   Essential hypertension, benign Controlled on Metoprolol 25mg  bid. off Losartan due to CKD. Update BMP   Diabetes mellitus Hgb A1c 7.2 3/27/149(was 7.4 07/22/12)-6.8 02/05/13. Takes Lantus 35units, Novolog 5 units with meals.      Depression takes Cymbalta 30mg (was reduced from 60mg  due to his falling). Stable.     Dementia Stable on Namenda    CKD (chronic kidney disease) Persisted with Bun 46, creatinine 1.97, K 5.3--improved K 4.7, Bun 35, creatinine 1.67 08/13/13 since off Losartan and started Metoprolol. Update BMP     CHF (congestive heart failure) Well compensated on Furosemide 20mg , trace BLE edema-chronic. CXR 06/29/13 showed no pulmonary vascular congestion.        Family/ Staff Communication: observe the patient  Goals of Care: SNF  Labs/tests ordered: CBC and BMP

## 2013-10-07 ENCOUNTER — Encounter: Payer: Self-pay | Admitting: Nurse Practitioner

## 2013-10-07 NOTE — Assessment & Plan Note (Signed)
Controlled on Metoprolol 25mg  bid. off Losartan due to CKD. Update BMP

## 2013-10-07 NOTE — Assessment & Plan Note (Signed)
takes Cymbalta 30mg(was reduced from 60mg due to his falling). Stable.                              

## 2013-10-07 NOTE — Assessment & Plan Note (Signed)
Hgb A1c 7.2 3/27/149(was 7.4 07/22/12)-6.8 02/05/13. Takes Lantus 35units, Novolog 5 units with meals.                           

## 2013-10-07 NOTE — Assessment & Plan Note (Signed)
Corrected with Levothyroxine 75mcg, TSH 1.894 06/30/13                           

## 2013-10-07 NOTE — Assessment & Plan Note (Signed)
Well compensated on Furosemide 20mg, trace BLE edema-chronic. CXR 06/29/13 showed no pulmonary vascular congestion.                            

## 2013-10-07 NOTE — Assessment & Plan Note (Signed)
Persisted with Bun 46, creatinine 1.97, K 5.3--improved K 4.7, Bun 35, creatinine 1.67 08/13/13 since off Losartan and started Metoprolol. Update BMP

## 2013-10-07 NOTE — Assessment & Plan Note (Signed)
Stable on Namenda    

## 2013-10-21 ENCOUNTER — Telehealth: Payer: Self-pay | Admitting: Medical Oncology

## 2013-10-21 ENCOUNTER — Telehealth: Payer: Self-pay | Admitting: Internal Medicine

## 2013-10-21 ENCOUNTER — Other Ambulatory Visit (HOSPITAL_BASED_OUTPATIENT_CLINIC_OR_DEPARTMENT_OTHER): Payer: Medicare Other

## 2013-10-21 ENCOUNTER — Ambulatory Visit (HOSPITAL_BASED_OUTPATIENT_CLINIC_OR_DEPARTMENT_OTHER): Payer: Medicare Other | Admitting: Internal Medicine

## 2013-10-21 VITALS — BP 127/56 | HR 81 | Temp 97.8°F | Resp 17 | Ht 68.0 in | Wt 227.6 lb

## 2013-10-21 DIAGNOSIS — N189 Chronic kidney disease, unspecified: Secondary | ICD-10-CM

## 2013-10-21 DIAGNOSIS — D696 Thrombocytopenia, unspecified: Secondary | ICD-10-CM

## 2013-10-21 DIAGNOSIS — D631 Anemia in chronic kidney disease: Secondary | ICD-10-CM

## 2013-10-21 DIAGNOSIS — E875 Hyperkalemia: Secondary | ICD-10-CM

## 2013-10-21 DIAGNOSIS — N039 Chronic nephritic syndrome with unspecified morphologic changes: Secondary | ICD-10-CM

## 2013-10-21 DIAGNOSIS — D649 Anemia, unspecified: Secondary | ICD-10-CM

## 2013-10-21 LAB — CBC WITH DIFFERENTIAL/PLATELET
BASO%: 0 % (ref 0.0–2.0)
Basophils Absolute: 0 10*3/uL (ref 0.0–0.1)
EOS ABS: 0.1 10*3/uL (ref 0.0–0.5)
EOS%: 1.7 % (ref 0.0–7.0)
HCT: 27.7 % — ABNORMAL LOW (ref 38.4–49.9)
HGB: 9.1 g/dL — ABNORMAL LOW (ref 13.0–17.1)
LYMPH#: 1.4 10*3/uL (ref 0.9–3.3)
LYMPH%: 26.5 % (ref 14.0–49.0)
MCH: 37.4 pg — ABNORMAL HIGH (ref 27.2–33.4)
MCHC: 32.9 g/dL (ref 32.0–36.0)
MCV: 114 fL — ABNORMAL HIGH (ref 79.3–98.0)
MONO#: 0.3 10*3/uL (ref 0.1–0.9)
MONO%: 5.9 % (ref 0.0–14.0)
NEUT%: 65.9 % (ref 39.0–75.0)
NEUTROS ABS: 3.6 10*3/uL (ref 1.5–6.5)
PLATELETS: 71 10*3/uL — AB (ref 140–400)
RBC: 2.43 10*6/uL — AB (ref 4.20–5.82)
RDW: 13.6 % (ref 11.0–14.6)
WBC: 5.4 10*3/uL (ref 4.0–10.3)
nRBC: 0 % (ref 0–0)

## 2013-10-21 LAB — BASIC METABOLIC PANEL (CC13)
Anion Gap: 10 mEq/L (ref 3–11)
BUN: 45.4 mg/dL — ABNORMAL HIGH (ref 7.0–26.0)
CHLORIDE: 104 meq/L (ref 98–109)
CO2: 20 meq/L — AB (ref 22–29)
Calcium: 9.1 mg/dL (ref 8.4–10.4)
Creatinine: 1.8 mg/dL — ABNORMAL HIGH (ref 0.7–1.3)
Glucose: 178 mg/dl — ABNORMAL HIGH (ref 70–140)
SODIUM: 133 meq/L — AB (ref 136–145)

## 2013-10-21 MED ORDER — DARBEPOETIN ALFA-POLYSORBATE 500 MCG/ML IJ SOLN
500.0000 ug | Freq: Once | INTRAMUSCULAR | Status: AC
  Administered 2013-10-21: 500 ug via SUBCUTANEOUS
  Filled 2013-10-21: qty 1

## 2013-10-21 NOTE — Patient Instructions (Signed)
Hyperkalemia Hyperkalemia is when you have too much potassium in your blood. This can be a life-threatening condition. Potassium is normally removed (excreted) from the body by the kidneys. CAUSES  The potassium level in your body can become too high for the following reasons:  You take in too much potassium. You can do this by:  Using salt substitutes. They contain large amounts of potassium.  Taking potassium supplements from your caregiver. The dose may be too high for you.  Eating foods or taking nutritional products with potassium.  You excrete too little potassium. This can happen if:  Your kidneys are not functioning properly. Kidney (renal) disease is a very common cause of hyperkalemia.  You are taking medicines that lower your excretion of potassium, such as certain diuretic medicines.  You have an adrenal gland disease called Addison's disease.  You have a urinary tract obstruction, such as kidney stones.  You are on treatment to mechanically clean your blood (dialysis) and you skip a treatment.  You release a high amount of potassium from your cells into your blood. You may have a condition that causes potassium to move from your cells to your bloodstream. This can happen with:  Injury to muscles or other tissues. Most potassium is stored in the muscles.  Severe burns or infections.  Acidic blood plasma (acidosis). Acidosis can result from many diseases, such as uncontrolled diabetes. SYMPTOMS  Usually, there are no symptoms unless the potassium is dangerously high or has risen very quickly. Symptoms may include:  Irregular or very slow heartbeat.  Feeling sick to your stomach (nauseous).  Tiredness (fatigue).  Nerve problems such as tingling of the skin, numbness of the hands or feet, weakness, or paralysis. DIAGNOSIS  A simple blood test can measure the amount of potassium in your body. An electrocardiogram test of the heart can also help make the diagnosis.  The heart may beat dangerously fast or slow down and stop beating with severe hyperkalemia.  TREATMENT  Treatment depends on how bad the condition is and on the underlying cause.  If the hyperkalemia is an emergency (causing heart problems or paralysis), many different medicines can be used alone or together to lower the potassium level briefly. This may include an insulin injection even if you are not diabetic. Emergency dialysis may be needed to remove potassium from the body.  If the hyperkalemia is less severe or dangerous, the underlying cause is treated. This can include taking medicines if needed. Your prescription medicines may be changed. You may also need to take a medicine to help your body get rid of potassium. You may need to eat a diet low in potassium. HOME CARE INSTRUCTIONS   Take medicines and supplements as directed by your caregiver.  Do not take any over-the-counter medicines, supplements, natural products, herbs, or vitamins without reviewing them with your caregiver. Certain supplements and natural food products can have high amounts of potassium. Other products (such as ibuprofen) can damage weak kidneys and raise your potassium.  You may be asked to do repeat lab tests. Be sure to follow these directions.  If you have kidney disease, you may need to follow a low potassium diet. SEEK MEDICAL CARE IF:   You notice an irregular or very slow heartbeat.  You feel lightheaded.  You develop weakness that is unusual for you. SEEK IMMEDIATE MEDICAL CARE IF:   You have shortness of breath.  You have chest discomfort.  You pass out (faint). MAKE SURE YOU:   Understand   You develop weakness that is unusual for you.  SEEK IMMEDIATE MEDICAL CARE IF:    You have shortness of breath.   You have chest discomfort.   You pass out (faint).  MAKE SURE YOU:    Understand these instructions.   Will watch your condition.   Will get help right away if you are not doing well or get worse.  Document Released: 07/06/2002 Document Revised: 10/08/2011 Document Reviewed: 12/21/2010  [employer] Patient Information 2014 [employer].

## 2013-10-21 NOTE — Telephone Encounter (Signed)
I called [facility] and spoke with pt's nurse. I explained that pt is in our office seeing Dr. Juliann Mule. His potassium level is 6.1. Pt and wife informed Dr. Juliann Mule that he has been using a salt substitute. Dr. Juliann Mule asked the pt to stop. I informed the nurse that Dr. Juliann Mule is sending orders back for [facility] and repeat labs. She voiced understanding.

## 2013-10-21 NOTE — Telephone Encounter (Signed)
gve the pt's wife the April may and June 2015 appt calendar

## 2013-10-23 LAB — BASIC METABOLIC PANEL
BUN: 46 mg/dL — AB (ref 4–21)
Creatinine: 1.7 mg/dL — AB (ref 0.6–1.3)
GLUCOSE: 124 mg/dL
POTASSIUM: 4.4 mmol/L (ref 3.4–5.3)
SODIUM: 137 mmol/L (ref 137–147)

## 2013-10-25 NOTE — Progress Notes (Signed)
[facility] OFFICE PROGRESS NOTE  GREEN, Lenon Curt, MD [address] [place] [address]  DIAGNOSIS: CKD (chronic kidney disease) - Plan: CBC with Differential in 2 months, CBC with Differential in 1 month, CBC with Differential, Basic metabolic panel (Bmet) - [facility], SCHEDULING COMMUNICATION INJECTION, darbepoetin alfa-polysorbate (ARANESP) injection 500 mcg  UNSPECIFIED ANEMIA - Plan: CBC with Differential in 2 months, CBC with Differential in 1 month, CBC with Differential, Basic metabolic panel (Bmet) - [facility]  Hyperkalemia  Anemia - Plan: SCHEDULING COMMUNICATION INJECTION, darbepoetin alfa-polysorbate (ARANESP) injection 500 mcg  Chief Complaint  Patient presents with  . Chronic Kidney Disease    CURRENT TREATMENT:  Aranesp 300 mcg every 4 weeks for hemoglobin less than 11 started on 09/23/13; increased to 500 mcg every 4 weeks today.   INTERVAL HISTORY: [place] 80 y.o. male who was referred by Dr. Chilton Si for evaluation of chronic anemia and thrombocytopenia and evaluated by me on 09/10/2013. Today, the patient is accompanied by his wife. He has dementia and provides limited history. Of note, he has Chronic kidney disease with a creatinine clearance of 31.1 ml/min and he resides at SNF at [facility]. He was last seen by me on 09/23/2013.   As previously reported, his recent anemia work-up revealed a Hgb of 8.3 on 09/02/2012; Normal iron 87, B12 740, Folate > 20. He has not been on iron supplementation. His hgb on 08/13/13 revealed a hgb of 8.0. GI (Dr. Judie Petit T. Pleas Koch. MD) was consulted on 08/26/2013 for anemia. Last upper endoscopy on 12/13/2011 revealed mild gastritis with recommendations for a PPI q am long term. The patient's review of systems for symptoms of anemia was negative. He does have memory problems.   His WBC was normal at 4.2 on 09/08/13; Hemoglobin of 8.0; Hematocrit of 23.5; MCV was 113.5; Platelet count of 65. He has hypothyroidism  and is compliant with levothyroxine 75 mcg, TSH 1.894 on 06/30/2013. Examining the plt trend revealed his thrombocytopenia started around 09/12/2011 following laparotomy by Dr. Biagio Quint and repair of perforated ulcer. He has since been on nexium 40 mg bid. Of note he takes namenda for his dementia; lasix 20 mg daily for congestive heart failure; lantus for his diabetes. He is on folic acid daily.   Wife also reports patient has been taking salt-substitute containing potassium chloride.  He denies any chest palpitations.   MEDICAL HISTORY: Past Medical History  Diagnosis Date  . Hypothyroidism   . Hypertension   . CHF (congestive heart failure)   . Diabetes mellitus   . Coronary artery disease   . Restless leg syndrome   . Hyperlipemia   . Renal disease     stage II  . Sleep apnea   . Cellulitis and abscess of leg   . Obesity   . Duodenal ulcer, perforated   . Diverticulosis   . Internal hemorrhoids   . CAD (coronary artery disease)   . Anemia   . Duodenal ulcer   . Allergy   . Blood transfusion   . Cataract   . Tuberculosis     as a child  . Contact dermatitis and other eczema, due to unspecified cause 10/06/2012  . Senile dementia with depressive features 05/26/2012  . Hyposmolality and/or hyponatremia 04/02/2012  . Diarrhea 03/26/2012  . Hemorrhage of rectum and anus 03/25/2012  . Pneumonia, organism unspecified 03/12/2012  . 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 Folate-deficiency anemia 01/07/2012  . Thrombocytopenia,  unspecified 09/26/2011  . Loss of weight 09/11/2011  . Dysphagia, unspecified(787.20) 06/28/2011  . Chronic kidney disease, stage III (moderate) 03/22/2011  . Cramp of limb 08/10/2010  . Chronic kidney disease, stage II (mild) 03/22/2011  . Personal history of fall 05/05/2009  . Unspecified vitamin D deficiency 02/24/2009  . Blood in stool 11/18/2008  . Dermatophytosis of the body 06/23/2007  . Obesity, unspecified 06/23/2007  .  Unspecified tinnitus 06/23/2007  . Edema 06/23/2007  . Shortness of breath 06/23/2007  . Flatulence, eructation, and gas pain 06/23/2007  . Other general symptoms(780.99) 06/23/2007  . Memory loss 08/13/2003    INTERIM HISTORY: has UNSPECIFIED ANEMIA; BLOOD IN STOOL; Duodenal ulcer; CHF (congestive heart failure); CKD (chronic kidney disease); Respiratory failure; Diabetes mellitus; Fall at nursing home; Depression; Dementia; Essential hypertension, benign; Unspecified hypothyroidism; Anemia; Unspecified gastritis and gastroduodenitis without mention of hemorrhage; Urinary tract infection, site not specified; Cough; Thrombocytopenia, unspecified; and Hyperkalemia on his problem list.    ALLERGIES:  is allergic to enalapril and milk-related compounds.  MEDICATIONS: has a current medication list which includes the following prescription(s): acetaminophen, calcium carbonate-vitamin d, cholecalciferol, duloxetine, fluticasone-salmeterol, folic acid, furosemide, guaifenesin, insulin aspart, insulin glargine, levothyroxine, memantine, metoprolol tartrate, fish oil, pantoprazole, protein supplement, tiotropium, and vitamin b-12.  SURGICAL HISTORY:  Past Surgical History  Procedure Laterality Date  . Laparotomy  09/12/2011    Procedure: EXPLORATORY LAPAROTOMY;  Surgeon: Judieth Keens, DO;  Location: [facility];  Service: General;  Laterality: N/A;  REPAIR PERFORATED ULCER  . Gastrostomy  09/12/2011    Procedure: GASTROSTOMY;  Surgeon: Judieth Keens, DO;  Location: [facility];  Service: General;  Laterality: N/A;  BIOPSY DUODENAL ULCER  . Shoulder surgery  [place], [place]  . Jejunostomy feeding tube      removed  . Back surgery  1942  . Eye surgery  [phone]    cataracts  . Coronary artery bypass graft  2201    REVIEW OF SYSTEMS:   Constitutional: Denies fevers, chills or abnormal weight loss Eyes: Denies blurriness of vision Ears, nose, mouth, throat, and face: Denies mucositis or  sore throat Respiratory: Denies cough, dyspnea or wheezes Cardiovascular: Denies palpitation, chest discomfort or lower extremity swelling Gastrointestinal:  Denies nausea, heartburn or change in bowel habits Skin: Denies abnormal skin rashes Lymphatics: Denies new lymphadenopathy or easy bruising Neurological:Denies numbness, tingling or new weaknesses; + Memory problems Behavioral/Psych: Mood is stable, no new changes  All other systems were reviewed with the patient and are negative.  PHYSICAL EXAMINATION: ECOG PERFORMANCE STATUS: 2 - Symptomatic, <50% confined to bed  Blood pressure 127/56, pulse 81, temperature 97.8 F (36.6 C), temperature source Oral, resp. rate 17, height $RemoveBe'5\' 8"'bDFBdcpVI$  (1.727 m), weight 227 lb 9.6 oz (103.239 kg), SpO2 98.00%.  GENERAL:alert, no distress and comfortable; obese sitting in wheelchair.  SKIN: skin color, texture, turgor are normal, no rashes or significant lesions  EYES: normal, Conjunctiva are pink and non-injected, sclera clear  OROPHARYNX:no exudate, no erythema and lips, buccal mucosa, and tongue normal  NECK: supple, thyroid normal size, non-tender, without nodularity  LYMPH: no palpable lymphadenopathy in the cervical, axillary or inguinal  LUNGS: Rales in the right lower field and left lower field. and percussion with normal breathing effort  HEART: regular rate & rhythm and no murmurs and 1+ pitting edema  ABDOMEN:abdomen soft, non-tender and normal bowel sounds  Musculoskeletal:no cyanosis of digits and no clubbing  NEURO: alert & oriented x 3 with fluent speech, no focal  motor/sensory deficits   Labs:  Lab Results  Component Value Date   WBC 5.4 10/21/2013   HGB 9.1* 10/21/2013   HCT 27.7* 10/21/2013   MCV 114.0* 10/21/2013   PLT 71* 10/21/2013   NEUTROABS 3.6 10/21/2013      Chemistry      Component Value Date/Time   NA 133* 10/21/2013 1254   NA 140 08/13/2013   NA 136 09/21/2011 0400   K 6.1 No visable hemolysis* 10/21/2013 1254   K 4.7  08/13/2013   CL 99 09/21/2011 0400   CO2 20* 10/21/2013 1254   CO2 30 09/21/2011 0400   BUN 45.4* 10/21/2013 1254   BUN 35* 08/13/2013   BUN 26* 09/21/2011 0400   CREATININE 1.8* 10/21/2013 1254   CREATININE 1.7* 08/13/2013   CREATININE 1.37* 09/21/2011 0400   GLU 77 08/13/2013      Component Value Date/Time   CALCIUM 9.1 10/21/2013 1254   CALCIUM 7.8* 09/21/2011 0400   ALKPHOS 84 09/10/2013 1249   ALKPHOS 66 06/30/2013   AST 11 09/10/2013 1249   AST 9* 06/30/2013   ALT 10 09/10/2013 1249   ALT 8* 06/30/2013   BILITOT 0.28 09/10/2013 1249   BILITOT 0.3 09/13/2011 0345     CBC:  Recent Labs Lab 10/21/13 1254  WBC 5.4  NEUTROABS 3.6  HGB 9.1*  HCT 27.7*  MCV 114.0*  PLT 71*    Studies:  No results found.   RADIOGRAPHIC STUDIES: No results found.  ASSESSMENT: PAVLE WILER 80 y.o. male with a history of CKD (chronic kidney disease) - Plan: CBC with Differential in 2 months, CBC with Differential in 1 month, CBC with Differential, Basic metabolic panel (Bmet) - [facility], SCHEDULING COMMUNICATION INJECTION, darbepoetin alfa-polysorbate (ARANESP) injection 500 mcg  UNSPECIFIED ANEMIA - Plan: CBC with Differential in 2 months, CBC with Differential in 1 month, CBC with Differential, Basic metabolic panel (Bmet) - [facility]  Hyperkalemia  Anemia - Plan: SCHEDULING COMMUNICATION INJECTION, darbepoetin alfa-polysorbate (ARANESP) injection 500 mcg   PLAN:   1. Anemia of mixed etiology including anemia of chronic kidney disease.  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 He had a normal MCV prior to lap secondary to perforated ulcer. His MCV was normocytic on 09/11/2012 prior to procedure. He is maintained on folic with normal folate and vitamin B-12 levels. In addition, his TSH is normal and he has normal liver function. Other considerations would be MDS, MM or other primary bone marrow bleedings. In addition, he could have some anemia related to his chronic kidney disease with  creatinine clearance of 31.3. His EPO level is low for the degree of his anemia.   --Thus, we started aranesp 300 mcg monthly on 02/25 with a goal hemoglobin greater than 10. His hemoglobin is 9.1 today up from 8.4.   Given his advanced age and co-morbidities, he was less inclined to obtain a bone marrow biopsy at this point.  Based on his initial response, we will increase to 500 mcg monthly starting today.   2. CKDz. --His creatinine is 1.8-2.0 with creatinine clearance of 31.1 mL/min.  Avoid nephrotoxins.    3. Thrombocytopenia, chronic.  --He denies any symptoms of bleeding.    4. Hyperkalemia, secondary to oral intake. --We advised against salt-substitute given his elevation of potassium.  He was given instructions on symptoms of hyperkalemia including arrhythmias.  We will repeat labs in one week to ensure that it is resolving.  We will give kayexalate one dose x once today.  5. Follow-up.  --He will follow up with [place] monthly to continue aranesp 500 mcg monthly. He will follow up in clinic in 2 months.   All questions were answered. The patient knows to call the clinic with any problems, questions or concerns. We can certainly see the patient much sooner if necessary.  I spent 15 minutes counseling the patient face to face. The total time spent in the appointment was 25 minutes.    Avaiyah Strubel, MD 10/25/2013 2:58 PM

## 2013-10-27 ENCOUNTER — Telehealth: Payer: Self-pay

## 2013-10-27 NOTE — Telephone Encounter (Signed)
Danielle RN from [place] called to clarify orders about lab and aranesp. She will dc all lab orders for [facility] at [facility] scheduled for 4/24, 5/24, 6/25. Pt will be seen at [facility] on 4/22, 5/20, and 6/17 for lab and aranesp injections. Danielle wrote down dates and times of appts.

## 2013-11-02 ENCOUNTER — Encounter: Payer: Self-pay | Admitting: Nurse Practitioner

## 2013-11-02 ENCOUNTER — Non-Acute Institutional Stay (SKILLED_NURSING_FACILITY): Payer: Medicare Other | Admitting: Nurse Practitioner

## 2013-11-02 DIAGNOSIS — E119 Type 2 diabetes mellitus without complications: Secondary | ICD-10-CM

## 2013-11-02 DIAGNOSIS — F32A Depression, unspecified: Secondary | ICD-10-CM

## 2013-11-02 DIAGNOSIS — I509 Heart failure, unspecified: Secondary | ICD-10-CM

## 2013-11-02 DIAGNOSIS — E039 Hypothyroidism, unspecified: Secondary | ICD-10-CM

## 2013-11-02 DIAGNOSIS — F0390 Unspecified dementia without behavioral disturbance: Secondary | ICD-10-CM

## 2013-11-02 DIAGNOSIS — R059 Cough, unspecified: Secondary | ICD-10-CM

## 2013-11-02 DIAGNOSIS — D649 Anemia, unspecified: Secondary | ICD-10-CM

## 2013-11-02 DIAGNOSIS — F329 Major depressive disorder, single episode, unspecified: Secondary | ICD-10-CM

## 2013-11-02 DIAGNOSIS — I10 Essential (primary) hypertension: Secondary | ICD-10-CM

## 2013-11-02 DIAGNOSIS — K269 Duodenal ulcer, unspecified as acute or chronic, without hemorrhage or perforation: Secondary | ICD-10-CM

## 2013-11-02 DIAGNOSIS — F3289 Other specified depressive episodes: Secondary | ICD-10-CM

## 2013-11-02 DIAGNOSIS — R05 Cough: Secondary | ICD-10-CM

## 2013-11-02 NOTE — Assessment & Plan Note (Signed)
Stable on Namenda    

## 2013-11-02 NOTE — Assessment & Plan Note (Signed)
Stable on Protonix 40mg bid, no c/o stomach pain, indigestion, or melena.     

## 2013-11-02 NOTE — Assessment & Plan Note (Signed)
Hgb A1c 7.2 3/27/149(was 7.4 07/22/12)-6.8 02/05/13. Takes Lantus 35units, Novolog 5 units with meals.

## 2013-11-02 NOTE — Assessment & Plan Note (Signed)
Corrected with Levothyroxine 35mcg, TSH 1.894 06/30/13

## 2013-11-02 NOTE — Assessment & Plan Note (Addendum)
09/23/13-Darbepoetin Alfa inj [facility]. Dx anemia in chronic kidney disease.  Last Hgb 9.1 10/21/13. Improved from 7s. Continue po Folic acid and Vit J47.

## 2013-11-02 NOTE — Assessment & Plan Note (Signed)
takes Cymbalta 30mg(was reduced from 60mg due to his falling). Stable.                              

## 2013-11-02 NOTE — Assessment & Plan Note (Signed)
Multiple factorials: after acute bronchitis, GERD, CHF-better with  Spiriva, Advair, bid Tussionex.

## 2013-11-02 NOTE — Assessment & Plan Note (Signed)
Controlled on Metoprolol 25mg bid. off Losartan due to CKD. Creatinine 1.5-2.0.  

## 2013-11-02 NOTE — Progress Notes (Signed)
Patient ID: [name] [name], male   DOB: 1919-09-25, 80 y.o.   MRN: [id number]   Code Status: DNR  Allergies  Allergen Reactions  . Enalapril Other (See Comments)    cough  . Milk-Related Compounds Other (See Comments)    Doesn't remember but its a mild reaction    Chief Complaint  Patient presents with  . Medical Managment of Chronic Issues    HPI: Patient is a 80 y.o. male seen in the SNF at [facility] today for evaluation of anemia of CKD, blood pressure,  CKD,  and other chronic medical conditions.  Problem List Items Addressed This Visit   Anemia - Primary     09/23/13-Darbepoetin Alfa inj [facility]. Dx anemia in chronic kidney disease.  Last Hgb 9.1 10/21/13. Improved from 7s. Continue po Folic acid and Vit 123456.      CHF (congestive heart failure)     compensated on Furosemide 20mg , trace BLE edema-chronic. CXR 06/29/13 showed no pulmonary vascular congestion.       Cough     Multiple factorials: after acute bronchitis, GERD, CHF-better with  Spiriva, Advair, bid Tussionex.      Dementia     Stable on Namenda       Depression     takes Cymbalta 30mg (was reduced from 60mg  due to his falling). Stable.       Diabetes mellitus (Chronic)     Hgb A1c 7.2 3/27/149(was 7.4 07/22/12)-6.8 02/05/13. Takes Lantus 35units, Novolog 5 units with meals.       Duodenal ulcer     Stable on Protonix 40mg  bid, no c/o stomach pain, indigestion, or melena.       Essential hypertension, benign     Controlled on Metoprolol 25mg  bid. off Losartan due to CKD. Creatinine 1.5-2.0     Unspecified hypothyroidism     Corrected with Levothyroxine 31mcg, TSH 1.894 06/30/13         Review of Systems:  Review of Systems  Constitutional: Negative for fever, chills, weight loss, malaise/fatigue and diaphoresis.  HENT: Positive for hearing loss. Negative for congestion, ear pain and sore throat.   Eyes: Negative for pain, discharge and redness.  Respiratory:  Positive for cough and sputum production. Negative for shortness of breath and wheezing.        Clear sputum. Associated with swallowing at times.  Cardiovascular: Positive for leg swelling (trace). Negative for chest pain, palpitations, orthopnea, claudication and PND.  Gastrointestinal: Negative for heartburn, abdominal pain, diarrhea, constipation and blood in stool.  Genitourinary: Positive for frequency. Negative for dysuria, urgency, hematuria and flank pain.  Musculoskeletal: Positive for back pain and joint pain. Negative for myalgias and neck pain.  Skin: Negative for itching and rash.       Small open area right buttock.   Neurological: Negative for dizziness, tingling, tremors, sensory change, speech change, focal weakness, seizures, loss of consciousness, weakness and headaches.  Endo/Heme/Allergies: Negative for environmental allergies and polydipsia. Does not bruise/bleed easily.  Psychiatric/Behavioral: Positive for memory loss. Negative for depression and hallucinations. The patient is nervous/anxious. The patient does not have insomnia.      Past Medical History  Diagnosis Date  . Hypothyroidism   . Hypertension   . CHF (congestive heart failure)   . Diabetes mellitus   . Coronary artery disease   . Restless leg syndrome   . Hyperlipemia   . Renal disease     stage II  . Sleep apnea   . Cellulitis  and abscess of leg   . Obesity   . Duodenal ulcer, perforated   . Diverticulosis   . Internal hemorrhoids   . CAD (coronary artery disease)   . Anemia   . Duodenal ulcer   . Allergy   . Blood transfusion   . Cataract   . Tuberculosis     as a child  . Contact dermatitis and other eczema, due to unspecified cause 10/06/2012  . Senile dementia with depressive features 05/26/2012  . Hyposmolality and/or hyponatremia 04/02/2012  . Diarrhea 03/26/2012  . Hemorrhage of rectum and anus 03/25/2012  . Pneumonia, organism unspecified 03/12/2012  . 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 Folate-deficiency anemia 01/07/2012  . Thrombocytopenia, unspecified 09/26/2011  . Loss of weight 09/11/2011  . Dysphagia, unspecified(787.20) 06/28/2011  . Chronic kidney disease, stage III (moderate) 03/22/2011  . Cramp of limb 08/10/2010  . Chronic kidney disease, stage II (mild) 03/22/2011  . Personal history of fall 05/05/2009  . Unspecified vitamin D deficiency 02/24/2009  . Blood in stool 11/18/2008  . Dermatophytosis of the body 06/23/2007  . Obesity, unspecified 06/23/2007  . Unspecified tinnitus 06/23/2007  . Edema 06/23/2007  . Shortness of breath 06/23/2007  . Flatulence, eructation, and gas pain 06/23/2007  . Other general symptoms(780.99) 06/23/2007  . Memory loss 08/13/2003   Past Surgical History  Procedure Laterality Date  . Laparotomy  09/12/2011    Procedure: EXPLORATORY LAPAROTOMY;  Surgeon: Judieth Keens, DO;  Location: [facility];  Service: General;  Laterality: N/A;  REPAIR PERFORATED ULCER  . Gastrostomy  09/12/2011    Procedure: GASTROSTOMY;  Surgeon: Judieth Keens, DO;  Location: [facility];  Service: General;  Laterality: N/A;  BIOPSY DUODENAL ULCER  . Shoulder surgery  [place], [place]  . Jejunostomy feeding tube      removed  . Back surgery  1942  . Eye surgery  [phone]    cataracts  . Coronary artery bypass graft  2201   Social History:   reports that he has quit smoking. His smoking use included Cigarettes. He smoked 0.00 packs per day. He has never used smokeless tobacco. He reports that he does not drink alcohol or use illicit drugs.  Family History  Problem Relation Age of Onset  . Stroke Father     Medications: Patient's Medications  New Prescriptions   No medications on file  Previous Medications   ACETAMINOPHEN (TYLENOL) 325 MG TABLET    Take 650 mg by mouth every 4 (four) hours as needed.   CALCIUM CARBONATE-VITAMIN D (CALCIUM 600+D) 600-400 MG-UNIT PER TABLET    Take 1 tablet by  mouth daily.   CHOLECALCIFEROL (VITAMIN D) 1000 UNITS TABLET    Take 1,000 Units by mouth daily. Take 1 tablet daily for vitamin D supplement.   DULOXETINE (CYMBALTA) 30 MG CAPSULE    Take 30 mg by mouth daily.   FLUTICASONE-SALMETEROL (ADVAIR HFA) 45-21 MCG/ACT INHALER    Inhale 2 puffs into the lungs 2 (two) times daily.   FOLIC ACID (FOLVITE) 1 MG TABLET       FUROSEMIDE (LASIX) 20 MG TABLET    Take 20 mg by mouth daily.    GUAIFENESIN (ROBITUSSIN) 100 MG/5ML LIQUID    Take 200 mg by mouth every 6 (six) hours as needed for cough.   INSULIN ASPART (NOVOLOG) 100 UNIT/ML INJECTION    Inject 5 Units into the skin 3 (three) times daily before meals. For CBG>150  INSULIN GLARGINE (LANTUS) 100 UNIT/ML INJECTION    Inject 35 Units into the skin at bedtime.    LEVOTHYROXINE (SYNTHROID, LEVOTHROID) 75 MCG TABLET    Take 75 mcg by mouth daily. Take 1 tablet daily  for thyroid supplement.   MEMANTINE (NAMENDA) 10 MG TABLET    Take 28 mg by mouth daily. Take 1 tablet twice daily for dementia/Alzheimer's disease.   METOPROLOL TARTRATE (LOPRESSOR) 25 MG TABLET    Take 25 mg by mouth 2 (two) times daily.   OMEGA-3 FATTY ACIDS (FISH OIL) 1200 MG CAPS    Take 1 capsule by mouth daily.   PANTOPRAZOLE (PROTONIX) 40 MG TABLET    Take 40 mg by mouth 2 (two) times daily. Take 1 tablet twice daily as needed to reduce stomach acid.   PROTEIN SUPPLEMENT (RESOURCE BENEPROTEIN) POWD    Take 1 scoop by mouth 2 (two) times daily.   TIOTROPIUM (SPIRIVA) 18 MCG INHALATION CAPSULE    Place 18 mcg into inhaler and inhale daily. 2 puffs daily   VITAMIN B-12 (CYANOCOBALAMIN) 1000 MCG TABLET    Take 1,000 mcg by mouth daily.  Modified Medications   No medications on file  Discontinued Medications   No medications on file     Physical Exam: Physical Exam  Constitutional: He is oriented to person, place, and time. He appears well-developed and well-nourished. No distress.  HENT:  Head: Normocephalic and atraumatic.  Right  Ear: External ear normal.  Left Ear: External ear normal.  Nose: Nose normal.  Mouth/Throat: Oropharynx is clear and moist. No oropharyngeal exudate.  Eyes: Conjunctivae and EOM are normal. Pupils are equal, round, and reactive to light.  Neck: Normal range of motion. Neck supple. No JVD present. No thyromegaly present.  Cardiovascular: Normal rate, regular rhythm and normal heart sounds.   No murmur heard. Pulmonary/Chest: Effort normal. No respiratory distress. He has no wheezes. He has rales in the right lower field and the left lower field. He exhibits no tenderness.  Bibasilar as prior.   Abdominal: Soft. Bowel sounds are normal. He exhibits no distension and no mass. There is no tenderness. There is no rebound.  Musculoskeletal: Normal range of motion. He exhibits edema (trace in BLE).  Lymphadenopathy:    He has no cervical adenopathy.  Neurological: He is alert and oriented to person, place, and time. He has normal reflexes. No cranial nerve deficit. Coordination normal.  Skin: Skin is warm and dry. No rash noted. He is not diaphoretic. No erythema.  Small open area right buttock.  Psychiatric: He has a normal mood and affect. His behavior is normal. Judgment and thought content normal.    Filed Vitals:   11/02/13 1142  BP: 140/80  Pulse: 80  Temp: 98.5 F (36.9 C)  TempSrc: Tympanic  Resp: 17   Labs reviewed: Basic Metabolic Panel:  Recent Labs  04/14/13  06/30/13  09/10/13 1249 10/21/13 1254 10/23/13  NA 137  < > 142  < > 135* 133* 137  K 4.8  < > 5.3  < > 5.8* 6.1 No visable hemolysis* 4.4  CO2  --   --   --   --  23 20*  --   GLUCOSE  --   --   --   --  165* 178*  --   BUN 34*  < > 46*  < > 41.4* 45.4* 46*  CREATININE 1.8*  < > 2.0*  < > 1.8* 1.8* 1.7*  CALCIUM  --   --   --   --  9.6 9.1  --   TSH 2.92  --  1.89  --   --   --   --   < > = values in this interval not displayed. Liver Function Tests:  Recent Labs  04/14/13 06/30/13 09/10/13 1249  AST 10*  9* 11  ALT 8* 8* 10  ALKPHOS 62 66 84  BILITOT  --   --  0.28  PROT  --   --  6.6  ALBUMIN  --   --  3.3*   CBC:  Recent Labs  09/10/13 1249 09/23/13 1354 10/21/13 1254  WBC 5.2 5.2 5.4  NEUTROABS 3.1 3.1 3.6  HGB 8.7* 8.4* 9.1*  HCT 26.6* 25.8* 27.7*  MCV 115.2* 115.7* 114.0*  PLT 70 Large platelets present* 61* 71*   Past Procedures:  CXR 06/29/13 showed there is mild patchy atelectasis or interstitial pneumonitis at the right lung base appearing significantly improved.   Assessment/Plan Anemia 09/23/13-Darbepoetin Alfa inj [facility]. Dx anemia in chronic kidney disease.  Last Hgb 9.1 10/21/13. Improved from 7s. Continue po Folic acid and Vit W25.    Diabetes mellitus Hgb A1c 7.2 3/27/149(was 7.4 07/22/12)-6.8 02/05/13. Takes Lantus 35units, Novolog 5 units with meals.     Unspecified hypothyroidism Corrected with Levothyroxine 88mcg, TSH 1.894 06/30/13    Depression takes Cymbalta 30mg (was reduced from 60mg  due to his falling). Stable.     CHF (congestive heart failure) compensated on Furosemide 20mg , trace BLE edema-chronic. CXR 06/29/13 showed no pulmonary vascular congestion.     Dementia Stable on Namenda     Duodenal ulcer Stable on Protonix 40mg  bid, no c/o stomach pain, indigestion, or melena.     Cough Multiple factorials: after acute bronchitis, GERD, CHF-better with  Spiriva, Advair, bid Tussionex.    Essential hypertension, benign Controlled on Metoprolol 25mg  bid. off Losartan due to CKD. Creatinine 1.5-2.0     Family/ Staff Communication: observe the patient  Goals of Care: SNF  Labs/tests ordered: none

## 2013-11-02 NOTE — Assessment & Plan Note (Signed)
compensated on Furosemide 20mg, trace BLE edema-chronic. CXR 06/29/13 showed no pulmonary vascular congestion.     

## 2013-11-18 ENCOUNTER — Ambulatory Visit (HOSPITAL_BASED_OUTPATIENT_CLINIC_OR_DEPARTMENT_OTHER): Payer: Medicare Other

## 2013-11-18 ENCOUNTER — Other Ambulatory Visit (HOSPITAL_BASED_OUTPATIENT_CLINIC_OR_DEPARTMENT_OTHER): Payer: Medicare Other

## 2013-11-18 VITALS — BP 120/55 | HR 75 | Temp 97.9°F

## 2013-11-18 DIAGNOSIS — N189 Chronic kidney disease, unspecified: Secondary | ICD-10-CM

## 2013-11-18 DIAGNOSIS — D649 Anemia, unspecified: Secondary | ICD-10-CM

## 2013-11-18 LAB — CBC WITH DIFFERENTIAL/PLATELET
BASO%: 0.2 % (ref 0.0–2.0)
Basophils Absolute: 0 10*3/uL (ref 0.0–0.1)
EOS ABS: 0.1 10*3/uL (ref 0.0–0.5)
EOS%: 1.6 % (ref 0.0–7.0)
HCT: 28 % — ABNORMAL LOW (ref 38.4–49.9)
HEMOGLOBIN: 9.3 g/dL — AB (ref 13.0–17.1)
LYMPH#: 1.2 10*3/uL (ref 0.9–3.3)
LYMPH%: 27.3 % (ref 14.0–49.0)
MCH: 38 pg — ABNORMAL HIGH (ref 27.2–33.4)
MCHC: 33.2 g/dL (ref 32.0–36.0)
MCV: 114.3 fL — ABNORMAL HIGH (ref 79.3–98.0)
MONO#: 0.3 10*3/uL (ref 0.1–0.9)
MONO%: 6.5 % (ref 0.0–14.0)
NEUT%: 64.4 % (ref 39.0–75.0)
NEUTROS ABS: 2.8 10*3/uL (ref 1.5–6.5)
Platelets: 63 10*3/uL — ABNORMAL LOW (ref 140–400)
RBC: 2.45 10*6/uL — ABNORMAL LOW (ref 4.20–5.82)
RDW: 13.6 % (ref 11.0–14.6)
WBC: 4.3 10*3/uL (ref 4.0–10.3)
nRBC: 0 % (ref 0–0)

## 2013-11-18 LAB — CBC AND DIFFERENTIAL
HEMATOCRIT: 28 % — AB (ref 41–53)
HEMOGLOBIN: 9.3 g/dL — AB (ref 13.5–17.5)
Platelets: 63 10*3/uL — AB (ref 150–399)
WBC: 4.3 10^3/mL

## 2013-11-18 MED ORDER — DARBEPOETIN ALFA-POLYSORBATE 500 MCG/ML IJ SOLN
500.0000 ug | Freq: Once | INTRAMUSCULAR | Status: AC
  Administered 2013-11-18: 500 ug via SUBCUTANEOUS
  Filled 2013-11-18: qty 1

## 2013-11-30 ENCOUNTER — Non-Acute Institutional Stay (SKILLED_NURSING_FACILITY): Payer: Medicare Other | Admitting: Nurse Practitioner

## 2013-11-30 DIAGNOSIS — I509 Heart failure, unspecified: Secondary | ICD-10-CM

## 2013-11-30 DIAGNOSIS — E119 Type 2 diabetes mellitus without complications: Secondary | ICD-10-CM

## 2013-11-30 DIAGNOSIS — F329 Major depressive disorder, single episode, unspecified: Secondary | ICD-10-CM

## 2013-11-30 DIAGNOSIS — D696 Thrombocytopenia, unspecified: Secondary | ICD-10-CM

## 2013-11-30 DIAGNOSIS — N189 Chronic kidney disease, unspecified: Secondary | ICD-10-CM

## 2013-11-30 DIAGNOSIS — F3289 Other specified depressive episodes: Secondary | ICD-10-CM

## 2013-11-30 DIAGNOSIS — E039 Hypothyroidism, unspecified: Secondary | ICD-10-CM

## 2013-11-30 DIAGNOSIS — F0390 Unspecified dementia without behavioral disturbance: Secondary | ICD-10-CM

## 2013-11-30 DIAGNOSIS — I10 Essential (primary) hypertension: Secondary | ICD-10-CM

## 2013-11-30 DIAGNOSIS — F32A Depression, unspecified: Secondary | ICD-10-CM

## 2013-11-30 DIAGNOSIS — K269 Duodenal ulcer, unspecified as acute or chronic, without hemorrhage or perforation: Secondary | ICD-10-CM

## 2013-11-30 DIAGNOSIS — D649 Anemia, unspecified: Secondary | ICD-10-CM

## 2013-11-30 NOTE — Assessment & Plan Note (Signed)
Corrected with Levothyroxine 69mcg, TSH 1.894 06/30/13, update TSH

## 2013-11-30 NOTE — Assessment & Plan Note (Signed)
takes Cymbalta 30mg (was reduced from 60mg  due to his falling). Stable.

## 2013-11-30 NOTE — Assessment & Plan Note (Signed)
Persisted with creatinine 1.97, improved to 1.7  since off Losartan and started Metoprolol.  

## 2013-11-30 NOTE — Assessment & Plan Note (Signed)
Controlled on Metoprolol 25mg bid. off Losartan due to CKD. Creatinine 1.5-2.0.  

## 2013-11-30 NOTE — Assessment & Plan Note (Signed)
Stable on Namenda    

## 2013-11-30 NOTE — Assessment & Plan Note (Signed)
/  25/15-Darbepoetin Alfa inj [facility]. Dx anemia in chronic kidney disease.  Last Hgb 9.1 10/21/13 and 9.3 11/19/13. Improved from 7s. Continue po Folic acid and Vit P29.

## 2013-11-30 NOTE — Assessment & Plan Note (Signed)
Takes Lantus 35units, Novolog 5 units with meals. Update Hgb A1c.

## 2013-11-30 NOTE — Assessment & Plan Note (Signed)
Persisted with Plt 60s

## 2013-11-30 NOTE — Assessment & Plan Note (Signed)
compensated on Furosemide 20mg, trace BLE edema-chronic. CXR 06/29/13 showed no pulmonary vascular congestion.     

## 2013-11-30 NOTE — Assessment & Plan Note (Signed)
Stable on Protonix 40mg bid, no c/o stomach pain, indigestion, or melena.     

## 2013-11-30 NOTE — Progress Notes (Signed)
Patient ID: [name] [name], male   DOB: [birth year], 80 y.o.   MRN: [id number]   Code Status: DNR  Allergies  Allergen Reactions  . Enalapril Other (See Comments)    cough  . Milk-Related Compounds Other (See Comments)    Doesn't remember but its a mild reaction    Chief Complaint  Patient presents with  . Medical Management of Chronic Issues    HPI: Patient is a 80 y.o. male seen in the SNF at [facility] today for evaluation of chronic medical conditions.  Problem List Items Addressed This Visit   Diabetes mellitus (Chronic)     Takes Lantus 35units, Novolog 5 units with meals. Update Hgb A1c.        Duodenal ulcer     Stable on Protonix 40mg  bid, no c/o stomach pain, indigestion, or melena.       CHF (congestive heart failure)     compensated on Furosemide 20mg , trace BLE edema-chronic. CXR 06/29/13 showed no pulmonary vascular congestion.      CKD (chronic kidney disease)     Persisted with creatinine 1.97, improved to 1.7  since off Losartan and started Metoprolol.     Depression     takes Cymbalta 30mg (was reduced from 60mg  due to his falling). Stable.        Dementia     Stable on Namenda      Essential hypertension, benign     Controlled on Metoprolol 25mg  bid. off Losartan due to CKD. Creatinine 1.5-2.0       Unspecified hypothyroidism - Primary     Corrected with Levothyroxine 74mcg, TSH 1.894 06/30/13, update TSH     Anemia     /25/15-Darbepoetin Alfa inj [facility]. Dx anemia in chronic kidney disease.  Last Hgb 9.1 10/21/13 and 9.3 11/19/13. Improved from 7s. Continue po Folic acid and Vit K53.      Thrombocytopenia, unspecified     Persisted with Plt 60s       Review of Systems:  Review of Systems  Constitutional: Negative for fever, chills, weight loss, malaise/fatigue and diaphoresis.  HENT: Positive for hearing loss. Negative for congestion, ear pain and sore throat.   Eyes: Negative for pain, discharge and redness.    Respiratory: Positive for cough and sputum production. Negative for shortness of breath and wheezing.        Clear sputum. Associated with swallowing at times.  Cardiovascular: Positive for leg swelling (trace). Negative for chest pain, palpitations, orthopnea, claudication and PND.  Gastrointestinal: Negative for heartburn, abdominal pain, diarrhea, constipation and blood in stool.  Genitourinary: Positive for frequency. Negative for dysuria, urgency, hematuria and flank pain.  Musculoskeletal: Positive for back pain and joint pain. Negative for myalgias and neck pain.  Skin: Negative for itching and rash.       Small open area right buttock.   Neurological: Negative for dizziness, tingling, tremors, sensory change, speech change, focal weakness, seizures, loss of consciousness, weakness and headaches.  Endo/Heme/Allergies: Negative for environmental allergies and polydipsia. Does not bruise/bleed easily.  Psychiatric/Behavioral: Positive for memory loss. Negative for depression and hallucinations. The patient is nervous/anxious. The patient does not have insomnia.      Past Medical History  Diagnosis Date  . Hypothyroidism   . Hypertension   . CHF (congestive heart failure)   . Diabetes mellitus   . Coronary artery disease   . Restless leg syndrome   . Hyperlipemia   . Renal disease     stage  II  . Sleep apnea   . Cellulitis and abscess of leg   . Obesity   . Duodenal ulcer, perforated   . Diverticulosis   . Internal hemorrhoids   . CAD (coronary artery disease)   . Anemia   . Duodenal ulcer   . Allergy   . Blood transfusion   . Cataract   . Tuberculosis     as a child  . Contact dermatitis and other eczema, due to unspecified cause 10/06/2012  . Senile dementia with depressive features 05/26/2012  . Hyposmolality and/or hyponatremia 04/02/2012  . Diarrhea 03/26/2012  . Hemorrhage of rectum and anus 03/25/2012  . Pneumonia, organism unspecified 03/12/2012  . Anemias  due to disorders of glutathione metabolism 01/07/2012  . Folate-deficiency anemia 01/07/2012  . Thrombocytopenia, unspecified 09/26/2011  . Loss of weight 09/11/2011  . Dysphagia, unspecified(787.20) 06/28/2011  . Chronic kidney disease, stage III (moderate) 03/22/2011  . Cramp of limb 08/10/2010  . Chronic kidney disease, stage II (mild) 03/22/2011  . Personal history of fall 05/05/2009  . Unspecified vitamin D deficiency 02/24/2009  . Blood in stool 11/18/2008  . Dermatophytosis of the body 06/23/2007  . Obesity, unspecified 06/23/2007  . Unspecified tinnitus 06/23/2007  . Edema 06/23/2007  . Shortness of breath 06/23/2007  . Flatulence, eructation, and gas pain 06/23/2007  . Other general symptoms(780.99) 06/23/2007  . Memory loss 08/13/2003   Past Surgical History  Procedure Laterality Date  . Laparotomy  09/12/2011    Procedure: EXPLORATORY LAPAROTOMY;  Surgeon: Judieth Keens, DO;  Location: [facility];  Service: General;  Laterality: N/A;  REPAIR PERFORATED ULCER  . Gastrostomy  09/12/2011    Procedure: GASTROSTOMY;  Surgeon: Judieth Keens, DO;  Location: [facility];  Service: General;  Laterality: N/A;  BIOPSY DUODENAL ULCER  . Shoulder surgery  [place], [place]  . Jejunostomy feeding tube      removed  . Back surgery  1942  . Eye surgery  [phone]    cataracts  . Coronary artery bypass graft  2201   Social History:   reports that he has quit smoking. His smoking use included Cigarettes. He smoked 0.00 packs per day. He has never used smokeless tobacco. He reports that he does not drink alcohol or use illicit drugs.  Family History  Problem Relation Age of Onset  . Stroke Father     Medications: Patient's Medications  New Prescriptions   No medications on file  Previous Medications   ACETAMINOPHEN (TYLENOL) 325 MG TABLET    Take 650 mg by mouth every 4 (four) hours as needed.   CALCIUM CARBONATE-VITAMIN D (CALCIUM 600+D) 600-400 MG-UNIT PER TABLET     Take 1 tablet by mouth daily.   CHOLECALCIFEROL (VITAMIN D) 1000 UNITS TABLET    Take 1,000 Units by mouth daily. Take 1 tablet daily for vitamin D supplement.   DULOXETINE (CYMBALTA) 30 MG CAPSULE    Take 30 mg by mouth daily.   FLUTICASONE-SALMETEROL (ADVAIR HFA) 45-21 MCG/ACT INHALER    Inhale 2 puffs into the lungs 2 (two) times daily.   FOLIC ACID (FOLVITE) 1 MG TABLET       FUROSEMIDE (LASIX) 20 MG TABLET    Take 20 mg by mouth daily.    GUAIFENESIN (ROBITUSSIN) 100 MG/5ML LIQUID    Take 200 mg by mouth every 6 (six) hours as needed for cough.   INSULIN ASPART (NOVOLOG) 100 UNIT/ML INJECTION    Inject 5 Units into the skin  3 (three) times daily before meals. For CBG>150   INSULIN GLARGINE (LANTUS) 100 UNIT/ML INJECTION    Inject 35 Units into the skin at bedtime.    LEVOTHYROXINE (SYNTHROID, LEVOTHROID) 75 MCG TABLET    Take 75 mcg by mouth daily. Take 1 tablet daily  for thyroid supplement.   MEMANTINE (NAMENDA) 10 MG TABLET    Take 28 mg by mouth daily. Take 1 tablet twice daily for dementia/Alzheimer's disease.   METOPROLOL TARTRATE (LOPRESSOR) 25 MG TABLET    Take 25 mg by mouth 2 (two) times daily.   OMEGA-3 FATTY ACIDS (FISH OIL) 1200 MG CAPS    Take 1 capsule by mouth daily.   PANTOPRAZOLE (PROTONIX) 40 MG TABLET    Take 40 mg by mouth 2 (two) times daily. Take 1 tablet twice daily as needed to reduce stomach acid.   PROTEIN SUPPLEMENT (RESOURCE BENEPROTEIN) POWD    Take 1 scoop by mouth 2 (two) times daily.   TIOTROPIUM (SPIRIVA) 18 MCG INHALATION CAPSULE    Place 18 mcg into inhaler and inhale daily. 2 puffs daily   VITAMIN B-12 (CYANOCOBALAMIN) 1000 MCG TABLET    Take 1,000 mcg by mouth daily.  Modified Medications   No medications on file  Discontinued Medications   No medications on file     Physical Exam: Physical Exam  Constitutional: He is oriented to person, place, and time. He appears well-developed and well-nourished. No distress.  HENT:  Head: Normocephalic and  atraumatic.  Right Ear: External ear normal.  Left Ear: External ear normal.  Nose: Nose normal.  Mouth/Throat: Oropharynx is clear and moist. No oropharyngeal exudate.  Eyes: Conjunctivae and EOM are normal. Pupils are equal, round, and reactive to light.  Neck: Normal range of motion. Neck supple. No JVD present. No thyromegaly present.  Cardiovascular: Normal rate, regular rhythm and normal heart sounds.   No murmur heard. Pulmonary/Chest: Effort normal. No respiratory distress. He has no wheezes. He has rales in the right lower field and the left lower field. He exhibits no tenderness.  Bibasilar as prior.   Abdominal: Soft. Bowel sounds are normal. He exhibits no distension and no mass. There is no tenderness. There is no rebound.  Musculoskeletal: Normal range of motion. He exhibits edema (trace in BLE).  Lymphadenopathy:    He has no cervical adenopathy.  Neurological: He is alert and oriented to person, place, and time. He has normal reflexes. No cranial nerve deficit. Coordination normal.  Skin: Skin is warm and dry. No rash noted. He is not diaphoretic. No erythema.  Small open area right buttock.  Psychiatric: He has a normal mood and affect. His behavior is normal. Judgment and thought content normal.    Filed Vitals:   11/30/13 1646  BP: 130/65  Pulse: 70  Temp: 98.3 F (36.8 C)  TempSrc: Tympanic  Resp: 16   Labs reviewed: Basic Metabolic Panel:  Recent Labs  04/14/13  06/30/13  09/10/13 1249 10/21/13 1254 10/23/13 12/01/13  NA 137  < > 142  < > 135* 133* 137  --   K 4.8  < > 5.3  < > 5.8* 6.1 No visable hemolysis* 4.4  --   CO2  --   --   --   --  23 20*  --   --   GLUCOSE  --   --   --   --  165* 178*  --   --   BUN 34*  < > 46*  < >  41.4* 45.4* 46*  --   CREATININE 1.8*  < > 2.0*  < > 1.8* 1.8* 1.7*  --   CALCIUM  --   --   --   --  9.6 9.1  --   --   TSH 2.92  --  1.89  --   --   --   --  2.55  < > = values in this interval not displayed. Liver Function  Tests:  Recent Labs  04/14/13 06/30/13 09/10/13 1249  AST 10* 9* 11  ALT 8* 8* 10  ALKPHOS 62 66 84  BILITOT  --   --  0.28  PROT  --   --  6.6  ALBUMIN  --   --  3.3*   CBC:  Recent Labs  09/23/13 1354 10/21/13 1254 11/18/13 11/18/13 0841  WBC 5.2 5.4 4.3 4.3  NEUTROABS 3.1 3.6  --  2.8  HGB 8.4* 9.1* 9.3* 9.3*  HCT 25.8* 27.7* 28* 28.0*  MCV 115.7* 114.0*  --  114.3*  PLT 61* 71* 63* 63*   Past Procedures:  CXR 06/29/13 showed there is mild patchy atelectasis or interstitial pneumonitis at the right lung base appearing significantly improved.   Assessment/Plan Anemia /25/15-Darbepoetin Alfa inj [facility]. Dx anemia in chronic kidney disease.  Last Hgb 9.1 10/21/13 and 9.3 11/19/13. Improved from 7s. Continue po Folic acid and Vit A54.    CHF (congestive heart failure) compensated on Furosemide 20mg , trace BLE edema-chronic. CXR 06/29/13 showed no pulmonary vascular congestion.    CKD (chronic kidney disease) Persisted with creatinine 1.97, improved to 1.7  since off Losartan and started Metoprolol.   Dementia Stable on Namenda    Depression takes Cymbalta 30mg (was reduced from 60mg  due to his falling). Stable.      Diabetes mellitus Takes Lantus 35units, Novolog 5 units with meals. Update Hgb A1c.      Duodenal ulcer Stable on Protonix 40mg  bid, no c/o stomach pain, indigestion, or melena.     Essential hypertension, benign Controlled on Metoprolol 25mg  bid. off Losartan due to CKD. Creatinine 1.5-2.0     Thrombocytopenia, unspecified Persisted with Plt 60s  Unspecified hypothyroidism Corrected with Levothyroxine 70mcg, TSH 1.894 06/30/13, update TSH     Family/ Staff Communication: observe the patient  Goals of Care: SNF  Labs/tests ordered: TSH and Hgb A1c.

## 2013-12-01 LAB — TSH: TSH: 2.55 u[IU]/mL (ref 0.41–5.90)

## 2013-12-01 LAB — HEMOGLOBIN A1C: Hgb A1c MFr Bld: 7.6 % — AB (ref 4.0–6.0)

## 2013-12-16 ENCOUNTER — Ambulatory Visit (HOSPITAL_BASED_OUTPATIENT_CLINIC_OR_DEPARTMENT_OTHER): Payer: Medicare Other

## 2013-12-16 ENCOUNTER — Other Ambulatory Visit (HOSPITAL_BASED_OUTPATIENT_CLINIC_OR_DEPARTMENT_OTHER): Payer: Medicare Other

## 2013-12-16 VITALS — BP 125/63 | HR 82 | Temp 98.1°F

## 2013-12-16 DIAGNOSIS — N189 Chronic kidney disease, unspecified: Secondary | ICD-10-CM

## 2013-12-16 DIAGNOSIS — D649 Anemia, unspecified: Secondary | ICD-10-CM

## 2013-12-16 LAB — CBC AND DIFFERENTIAL
HCT: 28 % — AB (ref 41–53)
Hemoglobin: 9.3 g/dL — AB (ref 13.5–17.5)
Platelets: 63 10*3/uL — AB (ref 150–399)
WBC: 4.5 10^3/mL

## 2013-12-16 LAB — CBC WITH DIFFERENTIAL/PLATELET
BASO%: 0.2 % (ref 0.0–2.0)
BASOS ABS: 0 10*3/uL (ref 0.0–0.1)
EOS%: 2 % (ref 0.0–7.0)
Eosinophils Absolute: 0.1 10*3/uL (ref 0.0–0.5)
HCT: 28 % — ABNORMAL LOW (ref 38.4–49.9)
HEMOGLOBIN: 9.3 g/dL — AB (ref 13.0–17.1)
LYMPH#: 1 10*3/uL (ref 0.9–3.3)
LYMPH%: 22.7 % (ref 14.0–49.0)
MCH: 36.9 pg — AB (ref 27.2–33.4)
MCHC: 33.2 g/dL (ref 32.0–36.0)
MCV: 111.1 fL — AB (ref 79.3–98.0)
MONO#: 0.3 10*3/uL (ref 0.1–0.9)
MONO%: 7.5 % (ref 0.0–14.0)
NEUT#: 3.1 10*3/uL (ref 1.5–6.5)
NEUT%: 67.6 % (ref 39.0–75.0)
Platelets: 71 10*3/uL — ABNORMAL LOW (ref 140–400)
RBC: 2.52 10*6/uL — ABNORMAL LOW (ref 4.20–5.82)
RDW: 13.7 % (ref 11.0–14.6)
WBC: 4.5 10*3/uL (ref 4.0–10.3)
nRBC: 0 % (ref 0–0)

## 2013-12-16 MED ORDER — DARBEPOETIN ALFA-POLYSORBATE 500 MCG/ML IJ SOLN
500.0000 ug | Freq: Once | INTRAMUSCULAR | Status: AC
  Administered 2013-12-16: 500 ug via SUBCUTANEOUS
  Filled 2013-12-16: qty 1

## 2013-12-23 ENCOUNTER — Other Ambulatory Visit: Payer: Self-pay | Admitting: Nurse Practitioner

## 2013-12-23 MED ORDER — OMEPRAZOLE 20 MG PO CPDR: 20.0000 mg | DELAYED_RELEASE_CAPSULE | Freq: Every day | ORAL | Status: DC

## 2013-12-28 ENCOUNTER — Encounter: Payer: Self-pay | Admitting: Nurse Practitioner

## 2013-12-28 ENCOUNTER — Non-Acute Institutional Stay (SKILLED_NURSING_FACILITY): Payer: Medicare Other | Admitting: Nurse Practitioner

## 2013-12-28 DIAGNOSIS — D649 Anemia, unspecified: Secondary | ICD-10-CM

## 2013-12-28 DIAGNOSIS — I509 Heart failure, unspecified: Secondary | ICD-10-CM

## 2013-12-28 DIAGNOSIS — R05 Cough: Secondary | ICD-10-CM

## 2013-12-28 DIAGNOSIS — R059 Cough, unspecified: Secondary | ICD-10-CM

## 2013-12-28 DIAGNOSIS — E119 Type 2 diabetes mellitus without complications: Secondary | ICD-10-CM

## 2013-12-28 DIAGNOSIS — I10 Essential (primary) hypertension: Secondary | ICD-10-CM

## 2013-12-28 DIAGNOSIS — K269 Duodenal ulcer, unspecified as acute or chronic, without hemorrhage or perforation: Secondary | ICD-10-CM

## 2013-12-28 DIAGNOSIS — F32A Depression, unspecified: Secondary | ICD-10-CM

## 2013-12-28 DIAGNOSIS — N189 Chronic kidney disease, unspecified: Secondary | ICD-10-CM

## 2013-12-28 DIAGNOSIS — F3289 Other specified depressive episodes: Secondary | ICD-10-CM

## 2013-12-28 DIAGNOSIS — F329 Major depressive disorder, single episode, unspecified: Secondary | ICD-10-CM

## 2013-12-28 DIAGNOSIS — E039 Hypothyroidism, unspecified: Secondary | ICD-10-CM

## 2013-12-28 DIAGNOSIS — F0390 Unspecified dementia without behavioral disturbance: Secondary | ICD-10-CM

## 2013-12-28 NOTE — Assessment & Plan Note (Signed)
compensated on Furosemide 20mg, trace BLE edema-chronic. CXR 06/29/13 showed no pulmonary vascular congestion.     

## 2013-12-28 NOTE — Assessment & Plan Note (Signed)
Controlled on Metoprolol 25mg bid. off Losartan due to CKD. Creatinine 1.5-2.0.  

## 2013-12-28 NOTE — Assessment & Plan Note (Signed)
Stable on Protonix 40mg bid, no c/o stomach pain, indigestion, or melena.     

## 2013-12-28 NOTE — Progress Notes (Signed)
Patient ID: [name] [name], male   DOB: Sep 06, 1919, 80 y.o.   MRN: [id number]   Code Status: DNR  Allergies  Allergen Reactions  . Enalapril Other (See Comments)    cough  . Milk-Related Compounds Other (See Comments)    Doesn't remember but its a mild reaction    Chief Complaint  Patient presents with  . Medical Management of Chronic Issues    HPI: Patient is a 80 y.o. male seen in the SNF at [facility] today for evaluation of chronic medical conditions.  Problem List Items Addressed This Visit   CHF (congestive heart failure)     compensated on Furosemide 20mg , trace BLE edema-chronic. CXR 06/29/13 showed no pulmonary vascular congestion.       CKD (chronic kidney disease)     Persisted with creatinine 1.97, improved to 1.7  since off Losartan and started Metoprolol.    Cough     Multiple factorials: GERD, ? Chronic bronchitis, CHF-better with  Spiriva, Advair, off Tussionex.       Dementia     Stable on Namenda      Depression     takes Cymbalta 30mg (was reduced from 60mg  due to his falling). Stable.       Diabetes mellitus - Primary (Chronic)     Takes Lantus 35units, Novolog 5 units with meals. Reasonably controlled. Hgb A1c 7.6 12/01/13    Duodenal ulcer     Stable on Protonix 40mg  bid, no c/o stomach pain, indigestion, or melena.        Essential hypertension, benign     Controlled on Metoprolol 25mg  bid. off Losartan due to CKD. Creatinine 1.5-2.0       UNSPECIFIED ANEMIA     Improved. Hgb 9s-Darbepoetin Alfa inj [facility]. Dx anemia in chronic kidney disease.  Improved from 7s. Continue po Folic acid and Vit E70.       Unspecified hypothyroidism     Corrected with Levothyroxine 55mcg, TSH 2.55 12/01/13.        Review of Systems:  Review of Systems  Constitutional: Negative for fever, chills, weight loss, malaise/fatigue and diaphoresis.  HENT: Positive for hearing loss. Negative for congestion, ear pain and sore throat.     Eyes: Negative for pain, discharge and redness.  Respiratory: Positive for cough and sputum production. Negative for shortness of breath and wheezing.        Clear sputum. Associated with swallowing at times.  Cardiovascular: Positive for leg swelling (trace). Negative for chest pain, palpitations, orthopnea, claudication and PND.  Gastrointestinal: Negative for heartburn, abdominal pain, diarrhea, constipation and blood in stool.  Genitourinary: Positive for frequency. Negative for dysuria, urgency, hematuria and flank pain.  Musculoskeletal: Positive for back pain and joint pain. Negative for myalgias and neck pain.  Skin: Negative for itching and rash.       Small open area right buttock.   Neurological: Negative for dizziness, tingling, tremors, sensory change, speech change, focal weakness, seizures, loss of consciousness, weakness and headaches.  Endo/Heme/Allergies: Negative for environmental allergies and polydipsia. Does not bruise/bleed easily.  Psychiatric/Behavioral: Positive for memory loss. Negative for depression and hallucinations. The patient is nervous/anxious. The patient does not have insomnia.      Past Medical History  Diagnosis Date  . Hypothyroidism   . Hypertension   . CHF (congestive heart failure)   . Diabetes mellitus   . Coronary artery disease   . Restless leg syndrome   . Hyperlipemia   . Renal disease  stage II  . Sleep apnea   . Cellulitis and abscess of leg   . Obesity   . Duodenal ulcer, perforated   . Diverticulosis   . Internal hemorrhoids   . CAD (coronary artery disease)   . Anemia   . Duodenal ulcer   . Allergy   . Blood transfusion   . Cataract   . Tuberculosis     as a child  . Contact dermatitis and other eczema, due to unspecified cause 10/06/2012  . Senile dementia with depressive features 05/26/2012  . Hyposmolality and/or hyponatremia 04/02/2012  . Diarrhea 03/26/2012  . Hemorrhage of rectum and anus 03/25/2012  .  Pneumonia, organism unspecified 03/12/2012  . 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 Folate-deficiency anemia 01/07/2012  . Thrombocytopenia, unspecified 09/26/2011  . Loss of weight 09/11/2011  . Dysphagia, unspecified(787.20) 06/28/2011  . Chronic kidney disease, stage III (moderate) 03/22/2011  . Cramp of limb 08/10/2010  . Chronic kidney disease, stage II (mild) 03/22/2011  . Personal history of fall 05/05/2009  . Unspecified vitamin D deficiency 02/24/2009  . Blood in stool 11/18/2008  . Dermatophytosis of the body 06/23/2007  . Obesity, unspecified 06/23/2007  . Unspecified tinnitus 06/23/2007  . Edema 06/23/2007  . Shortness of breath 06/23/2007  . Flatulence, eructation, and gas pain 06/23/2007  . Other general symptoms(780.99) 06/23/2007  . Memory loss 08/13/2003   Past Surgical History  Procedure Laterality Date  . Laparotomy  09/12/2011    Procedure: EXPLORATORY LAPAROTOMY;  Surgeon: Judieth Keens, DO;  Location: [facility];  Service: General;  Laterality: N/A;  REPAIR PERFORATED ULCER  . Gastrostomy  09/12/2011    Procedure: GASTROSTOMY;  Surgeon: Judieth Keens, DO;  Location: [facility];  Service: General;  Laterality: N/A;  BIOPSY DUODENAL ULCER  . Shoulder surgery  [place], [place]  . Jejunostomy feeding tube      removed  . Back surgery  1942  . Eye surgery  [phone]    cataracts  . Coronary artery bypass graft  2201   Social History:   reports that he has quit smoking. His smoking use included Cigarettes. He smoked 0.00 packs per day. He has never used smokeless tobacco. He reports that he does not drink alcohol or use illicit drugs.  Family History  Problem Relation Age of Onset  . Stroke Father     Medications: Patient's Medications  New Prescriptions   No medications on file  Previous Medications   ACETAMINOPHEN (TYLENOL) 325 MG TABLET    Take 650 mg by mouth every 4 (four) hours as needed.   CALCIUM  CARBONATE-VITAMIN D (CALCIUM 600+D) 600-400 MG-UNIT PER TABLET    Take 1 tablet by mouth daily.   CHOLECALCIFEROL (VITAMIN D) 1000 UNITS TABLET    Take 1,000 Units by mouth daily. Take 1 tablet daily for vitamin D supplement.   DULOXETINE (CYMBALTA) 30 MG CAPSULE    Take 30 mg by mouth daily.   FLUTICASONE-SALMETEROL (ADVAIR HFA) 45-21 MCG/ACT INHALER    Inhale 2 puffs into the lungs 2 (two) times daily.   FOLIC ACID (FOLVITE) 1 MG TABLET       FUROSEMIDE (LASIX) 20 MG TABLET    Take 20 mg by mouth daily.    GUAIFENESIN (ROBITUSSIN) 100 MG/5ML LIQUID    Take 200 mg by mouth every 6 (six) hours as needed for cough.   INSULIN ASPART (NOVOLOG) 100 UNIT/ML INJECTION    Inject 5 Units into the  skin 3 (three) times daily before meals. For CBG>150   INSULIN GLARGINE (LANTUS) 100 UNIT/ML INJECTION    Inject 35 Units into the skin at bedtime.    LEVOTHYROXINE (SYNTHROID, LEVOTHROID) 75 MCG TABLET    Take 75 mcg by mouth daily. Take 1 tablet daily  for thyroid supplement.   MEMANTINE (NAMENDA) 10 MG TABLET    Take 28 mg by mouth daily. Take 1 tablet twice daily for dementia/Alzheimer's disease.   METOPROLOL TARTRATE (LOPRESSOR) 25 MG TABLET    Take 25 mg by mouth 2 (two) times daily.   OMEGA-3 FATTY ACIDS (FISH OIL) 1200 MG CAPS    Take 1 capsule by mouth daily.   OMEPRAZOLE (PRILOSEC) 20 MG CAPSULE    Take 1 capsule (20 mg total) by mouth daily.   PROTEIN SUPPLEMENT (RESOURCE BENEPROTEIN) POWD    Take 1 scoop by mouth 2 (two) times daily.   TIOTROPIUM (SPIRIVA) 18 MCG INHALATION CAPSULE    Place 18 mcg into inhaler and inhale daily. 2 puffs daily   VITAMIN B-12 (CYANOCOBALAMIN) 1000 MCG TABLET    Take 1,000 mcg by mouth daily.  Modified Medications   No medications on file  Discontinued Medications   No medications on file     Physical Exam: Physical Exam  Constitutional: He is oriented to person, place, and time. He appears well-developed and well-nourished. No distress.  HENT:  Head:  Normocephalic and atraumatic.  Right Ear: External ear normal.  Left Ear: External ear normal.  Nose: Nose normal.  Mouth/Throat: Oropharynx is clear and moist. No oropharyngeal exudate.  Eyes: Conjunctivae and EOM are normal. Pupils are equal, round, and reactive to light.  Neck: Normal range of motion. Neck supple. No JVD present. No thyromegaly present.  Cardiovascular: Normal rate, regular rhythm and normal heart sounds.   No murmur heard. Pulmonary/Chest: Effort normal. No respiratory distress. He has no wheezes. He has rales in the right lower field and the left lower field. He exhibits no tenderness.  Bibasilar as prior.   Abdominal: Soft. Bowel sounds are normal. He exhibits no distension and no mass. There is no tenderness. There is no rebound.  Musculoskeletal: Normal range of motion. He exhibits edema (trace in BLE).  Lymphadenopathy:    He has no cervical adenopathy.  Neurological: He is alert and oriented to person, place, and time. He has normal reflexes. No cranial nerve deficit. Coordination normal.  Skin: Skin is warm and dry. No rash noted. He is not diaphoretic. No erythema.  Small open area right buttock.  Psychiatric: He has a normal mood and affect. His behavior is normal. Judgment and thought content normal.    Filed Vitals:   12/28/13 1319  BP: 112/68  Pulse: 88  Temp: 98.3 F (36.8 C)  TempSrc: Tympanic  Resp: 18   Labs reviewed: Basic Metabolic Panel:  Recent Labs  04/14/13  06/30/13  09/10/13 1249 10/21/13 1254 10/23/13 12/01/13  NA 137  < > 142  < > 135* 133* 137  --   K 4.8  < > 5.3  < > 5.8* 6.1 No visable hemolysis* 4.4  --   CO2  --   --   --   --  23 20*  --   --   GLUCOSE  --   --   --   --  165* 178*  --   --   BUN 34*  < > 46*  < > 41.4* 45.4* 46*  --   CREATININE 1.8*  < >  2.0*  < > 1.8* 1.8* 1.7*  --   CALCIUM  --   --   --   --  9.6 9.1  --   --   TSH 2.92  --  1.89  --   --   --   --  2.55  < > = values in this interval not  displayed. Liver Function Tests:  Recent Labs  04/14/13 06/30/13 09/10/13 1249  AST 10* 9* 11  ALT 8* 8* 10  ALKPHOS 62 66 84  BILITOT  --   --  0.28  PROT  --   --  6.6  ALBUMIN  --   --  3.3*   CBC:  Recent Labs  10/21/13 1254  11/18/13 0841 12/16/13 12/16/13 0944  WBC 5.4  < > 4.3 4.5 4.5  NEUTROABS 3.6  --  2.8  --  3.1  HGB 9.1*  < > 9.3* 9.3* 9.3*  HCT 27.7*  < > 28.0* 28* 28.0*  MCV 114.0*  --  114.3*  --  111.1*  PLT 71*  < > 63* 63* 71*  < > = values in this interval not displayed. Past Procedures:  CXR 06/29/13 showed there is mild patchy atelectasis or interstitial pneumonitis at the right lung base appearing significantly improved.   Assessment/Plan Diabetes mellitus Takes Lantus 35units, Novolog 5 units with meals. Reasonably controlled. Hgb A1c 7.6 12/01/13  Duodenal ulcer Stable on Protonix 40mg  bid, no c/o stomach pain, indigestion, or melena.      Unspecified hypothyroidism Corrected with Levothyroxine 22mcg, TSH 2.55 12/01/13.   Depression takes Cymbalta 30mg (was reduced from 60mg  due to his falling). Stable.     Dementia Stable on Namenda    CHF (congestive heart failure) compensated on Furosemide 20mg , trace BLE edema-chronic. CXR 06/29/13 showed no pulmonary vascular congestion.     Cough Multiple factorials: GERD, ? Chronic bronchitis, CHF-better with  Spiriva, Advair, off Tussionex.     UNSPECIFIED ANEMIA Improved. Hgb 9s-Darbepoetin Alfa inj [facility]. Dx anemia in chronic kidney disease.  Improved from 7s. Continue po Folic acid and Vit [id number].     Essential hypertension, benign Controlled on Metoprolol 25mg  bid. off Losartan due to CKD. Creatinine 1.5-2.0     CKD (chronic kidney disease) Persisted with creatinine 1.97, improved to 1.7  since off Losartan and started Metoprolol.    Family/ Staff Communication: observe the patient  Goals of Care: SNF  Labs/tests ordered: none

## 2013-12-28 NOTE — Assessment & Plan Note (Signed)
Improved. Hgb 9s-Darbepoetin Alfa inj [facility]. Dx anemia in chronic kidney disease.  Improved from 7s. Continue po Folic acid and Vit B12. 

## 2013-12-28 NOTE — Assessment & Plan Note (Signed)
Stable on Namenda    

## 2013-12-28 NOTE — Assessment & Plan Note (Signed)
Persisted with creatinine 1.97, improved to 1.7  since off Losartan and started Metoprolol.  

## 2013-12-28 NOTE — Assessment & Plan Note (Addendum)
Takes Lantus 35units, Novolog 5 units with meals. Reasonably controlled. Hgb A1c 7.6 12/01/13  

## 2013-12-28 NOTE — Assessment & Plan Note (Signed)
takes Cymbalta 30mg(was reduced from 60mg due to his falling). Stable.                              

## 2013-12-28 NOTE — Assessment & Plan Note (Signed)
Multiple factorials: GERD, ? Chronic bronchitis, CHF-better with  Spiriva, Advair, off Tussionex.    

## 2013-12-28 NOTE — Assessment & Plan Note (Signed)
Corrected with Levothyroxine 44mcg, TSH 2.55 12/01/13.

## 2014-01-06 ENCOUNTER — Ambulatory Visit: Payer: Medicare Other

## 2014-01-06 ENCOUNTER — Other Ambulatory Visit: Payer: Medicare Other

## 2014-01-11 ENCOUNTER — Encounter: Payer: Self-pay | Admitting: Nurse Practitioner

## 2014-01-11 ENCOUNTER — Non-Acute Institutional Stay (SKILLED_NURSING_FACILITY): Payer: Medicare Other | Admitting: Nurse Practitioner

## 2014-01-11 ENCOUNTER — Other Ambulatory Visit: Payer: Self-pay | Admitting: Internal Medicine

## 2014-01-11 DIAGNOSIS — E039 Hypothyroidism, unspecified: Secondary | ICD-10-CM

## 2014-01-11 DIAGNOSIS — F3289 Other specified depressive episodes: Secondary | ICD-10-CM

## 2014-01-11 DIAGNOSIS — F329 Major depressive disorder, single episode, unspecified: Secondary | ICD-10-CM

## 2014-01-11 DIAGNOSIS — E119 Type 2 diabetes mellitus without complications: Secondary | ICD-10-CM

## 2014-01-11 DIAGNOSIS — I10 Essential (primary) hypertension: Secondary | ICD-10-CM

## 2014-01-11 DIAGNOSIS — N189 Chronic kidney disease, unspecified: Secondary | ICD-10-CM

## 2014-01-11 DIAGNOSIS — I509 Heart failure, unspecified: Secondary | ICD-10-CM

## 2014-01-11 DIAGNOSIS — D649 Anemia, unspecified: Secondary | ICD-10-CM

## 2014-01-11 DIAGNOSIS — F0390 Unspecified dementia without behavioral disturbance: Secondary | ICD-10-CM

## 2014-01-11 DIAGNOSIS — F32A Depression, unspecified: Secondary | ICD-10-CM

## 2014-01-11 NOTE — Assessment & Plan Note (Signed)
Worse. 01/11/14 wife was tearful and stated [name] [name] yelled at her. She said he has been getting angry easily, throwing objects to staff, and verbally threatening staff. Increased Cymbata to 60mg . Update CBC, CMP, TSH

## 2014-01-11 NOTE — Assessment & Plan Note (Signed)
Improved. Hgb 9s-Darbepoetin Alfa inj [facility]. Dx anemia in chronic kidney disease.  Improved from 7s. Continue po Folic acid and Vit I33.

## 2014-01-11 NOTE — Assessment & Plan Note (Signed)
Takes Lantus 35units, Novolog 5 units with meals. Reasonably controlled. Hgb A1c 7.6 12/01/13

## 2014-01-11 NOTE — Assessment & Plan Note (Signed)
Stable on Namenda

## 2014-01-11 NOTE — Assessment & Plan Note (Addendum)
Corrected with Levothyroxine 80mcg, TSH 2.55 12/01/13 and 2.83 01/12/14

## 2014-01-11 NOTE — Progress Notes (Signed)
Patient ID: [name] [name], male   DOB: 02-23-1920, 80 y.o.   MRN: [id number]   Code Status: DNR  Allergies  Allergen Reactions  . Enalapril Other (See Comments)    cough  . Milk-Related Compounds Other (See Comments)    Doesn't remember but its a mild reaction    Chief Complaint  Patient presents with  . Medical Management of Chronic Issues  . Acute Visit    angry, yelling episodes.     HPI: Patient is a 80 y.o. male seen in the SNF at [facility] today for evaluation of mood instability and chronic medical conditions.  Problem List Items Addressed This Visit   Unspecified hypothyroidism     Corrected with Levothyroxine 61mcg, TSH 2.55 12/01/13 and 2.83 01/12/14     UNSPECIFIED ANEMIA     Improved. Hgb 9s-Darbepoetin Alfa inj [facility]. Dx anemia in chronic kidney disease.  Improved from 7s. Continue po Folic acid and Vit N47.    Essential hypertension, benign     Controlled on Metoprolol 25mg  bid. off Losartan due to CKD. Creatinine 1.5-2.0       Diabetes mellitus (Chronic)     Takes Lantus 35units, Novolog 5 units with meals. Reasonably controlled. Hgb A1c 7.6 12/01/13     Depression - Primary     Worse. 01/11/14 wife was tearful and stated [name] yelled at her. She said he has been getting angry easily, throwing objects to staff, and verbally threatening staff. Increased Cymbata to 60mg . Update CBC, CMP, TSH     Relevant Medications      DULoxetine (CYMBALTA) 60 MG capsule   Dementia     Stable on Namenda       Relevant Medications      DULoxetine (CYMBALTA) 60 MG capsule   CKD (chronic kidney disease)     Persisted with creatinine 1.97, improved to 1.7  since off Losartan and started Metoprolol.     CHF (congestive heart failure)     compensated on Furosemide 20mg , trace BLE edema-chronic. CXR 06/29/13 showed no pulmonary vascular congestion.        Anemia     Darbepoetin Alfa inj [facility]. Dx anemia in chronic kidney  disease.  Last Hgb 9.1 10/21/13 and 9.3 11/19/13. Improved from 7s. Continue po Folic acid and Vit S96.          Review of Systems:  Review of Systems  Constitutional: Negative for fever, chills, weight loss, malaise/fatigue and diaphoresis.  HENT: Positive for hearing loss. Negative for congestion, ear pain and sore throat.   Eyes: Negative for pain, discharge and redness.  Respiratory: Positive for cough and sputum production. Negative for shortness of breath and wheezing.        Clear sputum. Associated with swallowing at times.  Cardiovascular: Positive for leg swelling (trace). Negative for chest pain, palpitations, orthopnea, claudication and PND.  Gastrointestinal: Negative for heartburn, abdominal pain, diarrhea, constipation and blood in stool.  Genitourinary: Positive for frequency. Negative for dysuria, urgency, hematuria and flank pain.  Musculoskeletal: Positive for back pain and joint pain. Negative for myalgias and neck pain.  Skin: Negative for itching and rash.       Small open area right buttock.   Neurological: Negative for dizziness, tingling, tremors, sensory change, speech change, focal weakness, seizures, loss of consciousness, weakness and headaches.  Endo/Heme/Allergies: Negative for environmental allergies and polydipsia. Does not bruise/bleed easily.  Psychiatric/Behavioral: Positive for depression and memory loss. Negative for hallucinations. The  patient is nervous/anxious. The patient does not have insomnia.        Angry, throwing objects at staff, verbally threatening to others.      Past Medical History  Diagnosis Date  . Hypothyroidism   . Hypertension   . CHF (congestive heart failure)   . Diabetes mellitus   . Coronary artery disease   . Restless leg syndrome   . Hyperlipemia   . Renal disease     stage II  . Sleep apnea   . Cellulitis and abscess of leg   . Obesity   . Duodenal ulcer, perforated   . Diverticulosis   . Internal hemorrhoids     . CAD (coronary artery disease)   . Anemia   . Duodenal ulcer   . Allergy   . Blood transfusion   . Cataract   . Tuberculosis     as a child  . Contact dermatitis and other eczema, due to unspecified cause 10/06/2012  . Senile dementia with depressive features 05/26/2012  . Hyposmolality and/or hyponatremia 04/02/2012  . Diarrhea 03/26/2012  . Hemorrhage of rectum and anus 03/25/2012  . Pneumonia, organism unspecified 03/12/2012  . 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 Folate-deficiency anemia 01/07/2012  . Thrombocytopenia, unspecified 09/26/2011  . Loss of weight 09/11/2011  . Dysphagia, unspecified(787.20) 06/28/2011  . Chronic kidney disease, stage III (moderate) 03/22/2011  . Cramp of limb 08/10/2010  . Chronic kidney disease, stage II (mild) 03/22/2011  . Personal history of fall 05/05/2009  . Unspecified vitamin D deficiency 02/24/2009  . Blood in stool 11/18/2008  . Dermatophytosis of the body 06/23/2007  . Obesity, unspecified 06/23/2007  . Unspecified tinnitus 06/23/2007  . Edema 06/23/2007  . Shortness of breath 06/23/2007  . Flatulence, eructation, and gas pain 06/23/2007  . Other general symptoms(780.99) 06/23/2007  . Memory loss 08/13/2003   Past Surgical History  Procedure Laterality Date  . Laparotomy  09/12/2011    Procedure: EXPLORATORY LAPAROTOMY;  Surgeon: Judieth Keens, DO;  Location: [facility];  Service: General;  Laterality: N/A;  REPAIR PERFORATED ULCER  . Gastrostomy  09/12/2011    Procedure: GASTROSTOMY;  Surgeon: Judieth Keens, DO;  Location: [facility];  Service: General;  Laterality: N/A;  BIOPSY DUODENAL ULCER  . Shoulder surgery  [place], [place]  . Jejunostomy feeding tube      removed  . Back surgery  1942  . Eye surgery  [phone]    cataracts  . Coronary artery bypass graft  2201   Social History:   reports that he has quit smoking. His smoking use included Cigarettes. He smoked 0.00 packs per  day. He has never used smokeless tobacco. He reports that he does not drink alcohol or use illicit drugs.  Family History  Problem Relation Age of Onset  . Stroke Father     Medications: Patient's Medications  New Prescriptions   No medications on file  Previous Medications   ACETAMINOPHEN (TYLENOL) 325 MG TABLET    Take 650 mg by mouth every 4 (four) hours as needed.   CALCIUM CARBONATE-VITAMIN D (CALCIUM 600+D) 600-400 MG-UNIT PER TABLET    Take 1 tablet by mouth daily.   CHOLECALCIFEROL (VITAMIN D) 1000 UNITS TABLET    Take 1,000 Units by mouth daily. Take 1 tablet daily for vitamin D supplement.   DULOXETINE (CYMBALTA) 60 MG CAPSULE    Take 60 mg by mouth daily.   FLUTICASONE-SALMETEROL (ADVAIR HFA) 45-21 MCG/ACT INHALER  Inhale 2 puffs into the lungs 2 (two) times daily.   FOLIC ACID (FOLVITE) 1 MG TABLET    Take 1 mg by mouth daily.    FUROSEMIDE (LASIX) 20 MG TABLET    Take 20 mg by mouth daily.    GUAIFENESIN (ROBITUSSIN) 100 MG/5ML LIQUID    Take 200 mg by mouth every 6 (six) hours as needed for cough.   INSULIN ASPART (NOVOLOG) 100 UNIT/ML INJECTION    Inject 5 Units into the skin 3 (three) times daily before meals. For CBG>150   INSULIN GLARGINE (LANTUS) 100 UNIT/ML INJECTION    Inject 35 Units into the skin at bedtime.    LEVOTHYROXINE (SYNTHROID, LEVOTHROID) 75 MCG TABLET    Take 75 mcg by mouth daily. Take 1 tablet daily  for thyroid supplement.   MEMANTINE (NAMENDA) 10 MG TABLET    Take 28 mg by mouth daily. Take 1 tablet twice daily for dementia/Alzheimer's disease.   METOPROLOL TARTRATE (LOPRESSOR) 25 MG TABLET    Take 25 mg by mouth 2 (two) times daily.   OMEGA-3 FATTY ACIDS (FISH OIL) 1200 MG CAPS    Take 1 capsule by mouth daily.   OMEPRAZOLE (PRILOSEC) 20 MG CAPSULE    Take 1 capsule (20 mg total) by mouth daily.   PROTEIN SUPPLEMENT (RESOURCE BENEPROTEIN) POWD    Take 1 scoop by mouth 2 (two) times daily.   TIOTROPIUM (SPIRIVA) 18 MCG INHALATION CAPSULE    Place 18  mcg into inhaler and inhale daily. 2 puffs daily   VITAMIN B-12 (CYANOCOBALAMIN) 1000 MCG TABLET    Take 1,000 mcg by mouth daily.  Modified Medications   No medications on file  Discontinued Medications   DULOXETINE (CYMBALTA) 30 MG CAPSULE    Take 30 mg by mouth daily.     Physical Exam: Physical Exam  Constitutional: He is oriented to person, place, and time. He appears well-developed and well-nourished. No distress.  HENT:  Head: Normocephalic and atraumatic.  Right Ear: External ear normal.  Left Ear: External ear normal.  Nose: Nose normal.  Mouth/Throat: Oropharynx is clear and moist. No oropharyngeal exudate.  Eyes: Conjunctivae and EOM are normal. Pupils are equal, round, and reactive to light.  Neck: Normal range of motion. Neck supple. No JVD present. No thyromegaly present.  Cardiovascular: Normal rate, regular rhythm and normal heart sounds.   No murmur heard. Pulmonary/Chest: Effort normal. No respiratory distress. He has no wheezes. He has rales in the right lower field and the left lower field. He exhibits no tenderness.  Bibasilar as prior.   Abdominal: Soft. Bowel sounds are normal. He exhibits no distension and no mass. There is no tenderness. There is no rebound.  Musculoskeletal: Normal range of motion. He exhibits edema (trace in BLE).  Lymphadenopathy:    He has no cervical adenopathy.  Neurological: He is alert and oriented to person, place, and time. He has normal reflexes. No cranial nerve deficit. Coordination normal.  Skin: Skin is warm and dry. No rash noted. He is not diaphoretic. No erythema.  Small open area right buttock.  Psychiatric: Thought content normal. His mood appears anxious. His affect is angry and inappropriate. His affect is not blunt and not labile. His speech is not delayed, not tangential and not slurred. He is agitated and aggressive. He is not hyperactive, not slowed, not withdrawn, not actively hallucinating and not combative.  Cognition and memory are impaired. He exhibits a depressed mood. He is communicative. He exhibits abnormal recent memory.  He is attentive.    Filed Vitals:   01/11/14 1605  BP: 126/70  Pulse: 72  Temp: 98.4 F (36.9 C)  TempSrc: Tympanic  Resp: 18   Labs reviewed: Basic Metabolic Panel:  Recent Labs  06/30/13  09/10/13 1249 10/21/13 1254 10/23/13 12/01/13 01/12/14  NA 142  < > 135* 133* 137  --  135*  K 5.3  < > 5.8* 6.1 No visable hemolysis* 4.4  --  4.5  CO2  --   --  23 20*  --   --   --   GLUCOSE  --   --  165* 178*  --   --   --   BUN 46*  < > 41.4* 45.4* 46*  --  46*  CREATININE 2.0*  < > 1.8* 1.8* 1.7*  --  1.9*  CALCIUM  --   --  9.6 9.1  --   --   --   TSH 1.89  --   --   --   --  2.55 2.83  < > = values in this interval not displayed. Liver Function Tests:  Recent Labs  06/30/13 09/10/13 1249 01/12/14  AST 9* 11 9*  ALT 8* 10 8*  ALKPHOS 66 84 66  BILITOT  --  0.28  --   PROT  --  6.6  --   ALBUMIN  --  3.3*  --    CBC:  Recent Labs  11/18/13 0841  12/16/13 0944 01/12/14 01/13/14 0930  WBC 4.3  < > 4.5 3.8 5.0  NEUTROABS 2.8  --  3.1  --  3.3  HGB 9.3*  < > 9.3* 8.5* 9.1*  HCT 28.0*  < > 28.0* 25* 27.0*  MCV 114.3*  --  111.1*  --  110.7*  PLT 63*  < > 71* 67* 51*  < > = values in this interval not displayed. Past Procedures:  CXR 06/29/13 showed there is mild patchy atelectasis or interstitial pneumonitis at the right lung base appearing significantly improved.   Assessment/Plan Anemia Darbepoetin Alfa inj [facility]. Dx anemia in chronic kidney disease.  Last Hgb 9.1 10/21/13 and 9.3 11/19/13. Improved from 7s. Continue po Folic acid and Vit L87.     CHF (congestive heart failure) compensated on Furosemide 20mg , trace BLE edema-chronic. CXR 06/29/13 showed no pulmonary vascular congestion.      CKD (chronic kidney disease) Persisted with creatinine 1.97, improved to 1.7  since off Losartan and started  Metoprolol.   Dementia Stable on Namenda     Depression Worse. 01/11/14 wife was tearful and stated Mr. Luckey yelled at her. She said he has been getting angry easily, throwing objects to staff, and verbally threatening staff. Increased Cymbata to 60mg . Update CBC, CMP, TSH   Diabetes mellitus Takes Lantus 35units, Novolog 5 units with meals. Reasonably controlled. Hgb A1c 7.6 12/01/13   Unspecified hypothyroidism Corrected with Levothyroxine 52mcg, TSH 2.55 12/01/13 and 2.83 01/12/14   UNSPECIFIED ANEMIA Improved. Hgb 9s-Darbepoetin Alfa inj [facility]. Dx anemia in chronic kidney disease.  Improved from 7s. Continue po Folic acid and Vit F64.  Essential hypertension, benign Controlled on Metoprolol 25mg  bid. off Losartan due to CKD. Creatinine 1.5-2.0       Family/ Staff Communication: observe the patient  Goals of Care: SNF  Labs/tests ordered: CBC, CMP, TSH done 01/12/14

## 2014-01-11 NOTE — Assessment & Plan Note (Signed)
compensated on Furosemide 20mg , trace BLE edema-chronic. CXR 06/29/13 showed no pulmonary vascular congestion.

## 2014-01-11 NOTE — Assessment & Plan Note (Signed)
Controlled on Metoprolol 25mg bid. off Losartan due to CKD. Creatinine 1.5-2.0.  

## 2014-01-11 NOTE — Assessment & Plan Note (Signed)
Darbepoetin Alfa inj [facility]. Dx anemia in chronic kidney disease.  Last Hgb 9.1 10/21/13 and 9.3 11/19/13. Improved from 7s. Continue po Folic acid and Vit M76.

## 2014-01-11 NOTE — Assessment & Plan Note (Signed)
Persisted with creatinine 1.97, improved to 1.7  since off Losartan and started Metoprolol.

## 2014-01-12 LAB — HEPATIC FUNCTION PANEL
ALT: 8 U/L — AB (ref 10–40)
AST: 9 U/L — AB (ref 14–40)
Alkaline Phosphatase: 66 U/L (ref 25–125)
Bilirubin, Total: 0.3 mg/dL

## 2014-01-12 LAB — TSH: TSH: 2.83 u[IU]/mL (ref 0.41–5.90)

## 2014-01-12 LAB — BASIC METABOLIC PANEL
BUN: 46 mg/dL — AB (ref 4–21)
CREATININE: 1.9 mg/dL — AB (ref 0.6–1.3)
Glucose: 119 mg/dL
POTASSIUM: 4.5 mmol/L (ref 3.4–5.3)
SODIUM: 135 mmol/L — AB (ref 137–147)

## 2014-01-12 LAB — CBC AND DIFFERENTIAL
HCT: 25 % — AB (ref 41–53)
Hemoglobin: 8.5 g/dL — AB (ref 13.5–17.5)
Platelets: 67 10*3/uL — AB (ref 150–399)
WBC: 3.8 10^3/mL

## 2014-01-13 ENCOUNTER — Other Ambulatory Visit (HOSPITAL_BASED_OUTPATIENT_CLINIC_OR_DEPARTMENT_OTHER): Payer: Medicare Other

## 2014-01-13 ENCOUNTER — Ambulatory Visit (HOSPITAL_BASED_OUTPATIENT_CLINIC_OR_DEPARTMENT_OTHER): Payer: Medicare Other | Admitting: Internal Medicine

## 2014-01-13 ENCOUNTER — Telehealth: Payer: Self-pay | Admitting: Internal Medicine

## 2014-01-13 ENCOUNTER — Encounter: Payer: Self-pay | Admitting: Internal Medicine

## 2014-01-13 ENCOUNTER — Ambulatory Visit (HOSPITAL_BASED_OUTPATIENT_CLINIC_OR_DEPARTMENT_OTHER): Payer: Medicare Other

## 2014-01-13 VITALS — BP 105/46 | HR 94 | Temp 97.9°F | Resp 18 | Ht 68.0 in | Wt 235.5 lb

## 2014-01-13 DIAGNOSIS — D631 Anemia in chronic kidney disease: Secondary | ICD-10-CM

## 2014-01-13 DIAGNOSIS — D649 Anemia, unspecified: Secondary | ICD-10-CM

## 2014-01-13 DIAGNOSIS — N039 Chronic nephritic syndrome with unspecified morphologic changes: Secondary | ICD-10-CM

## 2014-01-13 DIAGNOSIS — N189 Chronic kidney disease, unspecified: Secondary | ICD-10-CM

## 2014-01-13 DIAGNOSIS — E039 Hypothyroidism, unspecified: Secondary | ICD-10-CM

## 2014-01-13 DIAGNOSIS — D696 Thrombocytopenia, unspecified: Secondary | ICD-10-CM

## 2014-01-13 LAB — CBC WITH DIFFERENTIAL/PLATELET
BASO%: 0.2 % (ref 0.0–2.0)
BASOS ABS: 0 10*3/uL (ref 0.0–0.1)
EOS%: 0.8 % (ref 0.0–7.0)
Eosinophils Absolute: 0 10*3/uL (ref 0.0–0.5)
HEMATOCRIT: 27 % — AB (ref 38.4–49.9)
HEMOGLOBIN: 9.1 g/dL — AB (ref 13.0–17.1)
LYMPH%: 26 % (ref 14.0–49.0)
MCH: 37.3 pg — AB (ref 27.2–33.4)
MCHC: 33.7 g/dL (ref 32.0–36.0)
MCV: 110.7 fL — AB (ref 79.3–98.0)
MONO#: 0.3 10*3/uL (ref 0.1–0.9)
MONO%: 6.2 % (ref 0.0–14.0)
NEUT#: 3.3 10*3/uL (ref 1.5–6.5)
NEUT%: 66.8 % (ref 39.0–75.0)
PLATELETS: 51 10*3/uL — AB (ref 140–400)
RBC: 2.44 10*6/uL — ABNORMAL LOW (ref 4.20–5.82)
RDW: 14.2 % (ref 11.0–14.6)
WBC: 5 10*3/uL (ref 4.0–10.3)
lymph#: 1.3 10*3/uL (ref 0.9–3.3)
nRBC: 0 % (ref 0–0)

## 2014-01-13 MED ORDER — DARBEPOETIN ALFA-POLYSORBATE 500 MCG/ML IJ SOLN
500.0000 ug | Freq: Once | INTRAMUSCULAR | Status: AC
  Administered 2014-01-13: 500 ug via SUBCUTANEOUS
  Filled 2014-01-13: qty 1

## 2014-01-13 NOTE — Telephone Encounter (Signed)
gv and printed appt sched and avs for pt for July thru Sept

## 2014-01-13 NOTE — Progress Notes (Signed)
[facility] OFFICE PROGRESS NOTE  GREEN, [name] [name], MD [address] [place] [address]  DIAGNOSIS: Anemia - Plan: CBC with Differential, Basic metabolic panel (Bmet) - [facility]  CKD (chronic kidney disease) - Plan: CBC with Differential, Basic metabolic panel (Bmet) - [place]  Chief Complaint  Patient presents with  . Anemia in chronic kidney disease(285.21)    CURRENT TREATMENT:  Aranesp 300 mcg every 4 weeks for hemoglobin less than 11 started on 09/23/13; increased to 500 mcg every 4 weeks today.   INTERVAL HISTORY: [name] [name] 80 y.o. male who was referred by Dr. Nyoka Cowden for evaluation of chronic anemia and thrombocytopenia and evaluated by me on 08/26/2013. Today, the patient is accompanied by his wife. He has dementia and provides limited history. Of note, he has Chronic kidney disease with a creatinine clearance of 30 ml/min and he resides at SNF at [facility]. He was last seen by me on 10/25/2013.   As previously reported, his recent anemia work-up revealed a Hgb of 8.3 on 09/02/2012; Normal iron 87, B12 740, Folate > 20. He has not been on iron supplementation. His hgb on 08/13/13 revealed a hgb of 8.0. GI (Dr. Norberto Sorenson T. Dagoberto Ligas. MD) was consulted on 08/26/2013 for anemia. Last upper endoscopy on 12/13/2011 revealed mild gastritis with recommendations for a PPI q am long term. The patient's review of systems for symptoms of anemia was negative. He does have memory problems.   His WBC was normal at 4.2 on 09/08/13; Hemoglobin of 8.0; Hematocrit of 23.5; MCV was 113.5; Platelet count of 65. He has hypothyroidism and is compliant with levothyroxine 75 mcg, TSH 1.894 on 06/30/2013. Examining the plt trend revealed his thrombocytopenia started around 09/12/2011 following laparotomy by Dr. Lilyan Punt and repair of perforated ulcer. He has since been on nexium 40 mg bid. Of note he takes namenda for his dementia; lasix 20 mg daily for congestive heart failure; lantus for  his diabetes. He is on folic acid daily.   Today, he is accompanied by his wife.  He reports overall doing well.  He has had some improvement with his symptoms of fatigue since starting his aranesp shots.   MEDICAL HISTORY: Past Medical History  Diagnosis Date  . Hypothyroidism   . Hypertension   . CHF (congestive heart failure)   . Diabetes mellitus   . Coronary artery disease   . Restless leg syndrome   . Hyperlipemia   . Renal disease     stage II  . Sleep apnea   . Cellulitis and abscess of leg   . Obesity   . Duodenal ulcer, perforated   . Diverticulosis   . Internal hemorrhoids   . CAD (coronary artery disease)   . Anemia   . Duodenal ulcer   . Allergy   . Blood transfusion   . Cataract   . Tuberculosis     as a child  . Contact dermatitis and other eczema, due to unspecified cause 10/06/2012  . Senile dementia with depressive features 05/26/2012  . Hyposmolality and/or hyponatremia 04/02/2012  . Diarrhea 03/26/2012  . Hemorrhage of rectum and anus 03/25/2012  . Pneumonia, organism unspecified 03/12/2012  . 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 Folate-deficiency anemia 01/07/2012  . Thrombocytopenia, unspecified 09/26/2011  . Loss of weight 09/11/2011  . Dysphagia, unspecified(787.20) 06/28/2011  . Chronic kidney disease, stage III (moderate) 03/22/2011  . Cramp of limb 08/10/2010  . Chronic kidney disease, stage II (  mild) 03/22/2011  . Personal history of fall 05/05/2009  . Unspecified vitamin D deficiency 02/24/2009  . Blood in stool 11/18/2008  . Dermatophytosis of the body 06/23/2007  . Obesity, unspecified 06/23/2007  . Unspecified tinnitus 06/23/2007  . Edema 06/23/2007  . Shortness of breath 06/23/2007  . Flatulence, eructation, and gas pain 06/23/2007  . Other general symptoms(780.99) 06/23/2007  . Memory loss 08/13/2003    INTERIM HISTORY: has UNSPECIFIED ANEMIA; BLOOD IN STOOL; Duodenal ulcer; CHF (congestive heart  failure); CKD (chronic kidney disease); Respiratory failure; Diabetes mellitus; Fall at nursing home; Depression; Dementia; Essential hypertension, benign; Unspecified hypothyroidism; Anemia; Unspecified gastritis and gastroduodenitis without mention of hemorrhage; Urinary tract infection, site not specified; Cough; Thrombocytopenia, unspecified; and Hyperkalemia on his problem list.    ALLERGIES:  is allergic to enalapril and milk-related compounds.  MEDICATIONS: has a current medication list which includes the following prescription(s): acetaminophen, calcium carbonate-vitamin d, cholecalciferol, duloxetine, fluticasone-salmeterol, folic acid, furosemide, guaifenesin, insulin aspart, insulin glargine, levothyroxine, memantine, metoprolol tartrate, fish oil, omeprazole, protein supplement, tiotropium, and vitamin b-12.  SURGICAL HISTORY:  Past Surgical History  Procedure Laterality Date  . Laparotomy  09/12/2011    Procedure: EXPLORATORY LAPAROTOMY;  Surgeon: Judieth Keens, DO;  Location: [facility];  Service: General;  Laterality: N/A;  REPAIR PERFORATED ULCER  . Gastrostomy  09/12/2011    Procedure: GASTROSTOMY;  Surgeon: Judieth Keens, DO;  Location: [facility];  Service: General;  Laterality: N/A;  BIOPSY DUODENAL ULCER  . Shoulder surgery  [place], [place]  . Jejunostomy feeding tube      removed  . Back surgery  1942  . Eye surgery  [phone]    cataracts  . Coronary artery bypass graft  2201    REVIEW OF SYSTEMS:   Constitutional: Denies fevers, chills or abnormal weight loss Eyes: Denies blurriness of vision Ears, nose, mouth, throat, and face: Denies mucositis or sore throat Respiratory: Denies cough, dyspnea or wheezes Cardiovascular: Denies palpitation, chest discomfort or lower extremity swelling Gastrointestinal:  Denies nausea, heartburn or change in bowel habits Skin: Denies abnormal skin rashes Lymphatics: Denies new lymphadenopathy or easy  bruising Neurological:Denies numbness, tingling or new weaknesses; + Memory problems Behavioral/Psych: Mood is stable, no new changes  All other systems were reviewed with the patient and are negative.  PHYSICAL EXAMINATION: ECOG PERFORMANCE STATUS: 2 - Symptomatic, <50% confined to bed  Blood pressure 105/46, pulse 94, temperature 97.9 F (36.6 C), temperature source Oral, resp. rate 18, height _0  (1.727 m), weight 235 lb 8 oz (106.822 kg), SpO2 97.00%.  GENERAL:alert, no distress and comfortable; obese sitting in wheelchair.  SKIN: skin color, texture, turgor are normal, no rashes or significant lesions  EYES: normal, Conjunctiva are pink and non-injected, sclera clear  OROPHARYNX:no exudate, no erythema and lips, buccal mucosa, and tongue normal  NECK: supple, thyroid normal size, non-tender, without nodularity  LYMPH: no palpable lymphadenopathy in the cervical, axillary or inguinal  LUNGS: Rales in the right lower field and left lower field. and percussion with normal breathing effort  HEART: regular rate & rhythm and no murmurs and 1+ pitting edema  ABDOMEN:abdomen soft, non-tender and normal bowel sounds  Musculoskeletal:no cyanosis of digits and no clubbing  NEURO: alert & oriented x 3 with fluent speech, no focal motor/sensory deficits   Labs:  Lab Results  Component Value Date   WBC 5.0 01/13/2014   HGB 9.1* 01/13/2014   HCT 27.0* 01/13/2014   MCV 110.7* 01/13/2014   PLT  51* 01/13/2014   NEUTROABS 3.3 01/13/2014      Chemistry      Component Value Date/Time   NA 135* 01/12/2014   NA 133* 10/21/2013 1254   NA 136 09/21/2011 0400   K 4.5 01/12/2014   K 6.1 No visable hemolysis* 10/21/2013 1254   CL 99 09/21/2011 0400   CO2 20* 10/21/2013 1254   CO2 30 09/21/2011 0400   BUN 46* 01/12/2014   BUN 45.4* 10/21/2013 1254   BUN 26* 09/21/2011 0400   CREATININE 1.9* 01/12/2014   CREATININE 1.8* 10/21/2013 1254   CREATININE 1.37* 09/21/2011 0400   GLU 119 01/12/2014      Component  Value Date/Time   CALCIUM 9.1 10/21/2013 1254   CALCIUM 7.8* 09/21/2011 0400   ALKPHOS 66 01/12/2014   ALKPHOS 84 09/10/2013 1249   AST 9* 01/12/2014   AST 11 09/10/2013 1249   ALT 8* 01/12/2014   ALT 10 09/10/2013 1249   BILITOT 0.28 09/10/2013 1249   BILITOT 0.3 09/13/2011 0345     CBC:  Recent Labs Lab 01/12/14 01/13/14 0930  WBC 3.8 5.0  NEUTROABS  --  3.3  HGB 8.5* 9.1*  HCT 25* 27.0*  MCV  --  110.7*  PLT 67* 51*    Studies:  No results found.   RADIOGRAPHIC STUDIES: No results found.  ASSESSMENT: [name] [name] 80 y.o. male with a history of Anemia - Plan: CBC with Differential, Basic metabolic panel (Bmet) - [facility]  CKD (chronic kidney disease) - Plan: CBC with Differential, Basic metabolic panel (Bmet) - [facility]   PLAN:   1. Anemia of mixed etiology including anemia of chronic kidney disease.  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 He had a normal MCV prior to lap secondary to perforated ulcer. His MCV was normocytic on 09/11/2012 prior to procedure. He is maintained on folic with normal folate and vitamin B-12 levels. In addition, his TSH is normal and he has normal liver function. Other considerations would be MDS, MM or other primary bone marrow bleedings. In addition, he could have some anemia related to his chronic kidney disease with creatinine clearance of 30. His EPO level is low for the degree of his anemia.   --Thus, we started aranesp 300 mcg monthly on 02/25 with a goal hemoglobin greater than 10. His hemoglobin is 9.1 today.  Given his advanced age and co-morbidities, he was less inclined to obtain a bone marrow biopsy at this point.  Based on his initial response, we will continue his aranesp at 500 mcg monthly.   2. CKDz. --His creatinine is 1.8-2.0 with creatinine clearance of 30 mL/min.  Avoid nephrotoxins.    3. Thrombocytopenia, chronic.  --He denies any symptoms of bleeding.    4. Follow-up.  --He will follow up with [place] monthly to  continue aranesp 500 mcg monthly. He will follow up in clinic in 3 months.   All questions were answered. The patient knows to call the clinic with any problems, questions or concerns. We can certainly see the patient much sooner if necessary.  I spent 15 minutes counseling the patient face to face. The total time spent in the appointment was 25 minutes.    CHISM, DAVID, MD 01/13/2014 5:01 PM

## 2014-01-25 ENCOUNTER — Encounter: Payer: Self-pay | Admitting: Nurse Practitioner

## 2014-01-25 ENCOUNTER — Non-Acute Institutional Stay (SKILLED_NURSING_FACILITY): Payer: Medicare Other | Admitting: Nurse Practitioner

## 2014-01-25 DIAGNOSIS — N390 Urinary tract infection, site not specified: Secondary | ICD-10-CM

## 2014-01-25 DIAGNOSIS — K269 Duodenal ulcer, unspecified as acute or chronic, without hemorrhage or perforation: Secondary | ICD-10-CM

## 2014-01-25 DIAGNOSIS — N183 Chronic kidney disease, stage 3 unspecified: Secondary | ICD-10-CM

## 2014-01-25 DIAGNOSIS — E039 Hypothyroidism, unspecified: Secondary | ICD-10-CM

## 2014-01-25 DIAGNOSIS — R059 Cough, unspecified: Secondary | ICD-10-CM

## 2014-01-25 DIAGNOSIS — F329 Major depressive disorder, single episode, unspecified: Secondary | ICD-10-CM

## 2014-01-25 DIAGNOSIS — I509 Heart failure, unspecified: Secondary | ICD-10-CM

## 2014-01-25 DIAGNOSIS — D631 Anemia in chronic kidney disease: Secondary | ICD-10-CM

## 2014-01-25 DIAGNOSIS — N189 Chronic kidney disease, unspecified: Secondary | ICD-10-CM

## 2014-01-25 DIAGNOSIS — E1329 Other specified diabetes mellitus with other diabetic kidney complication: Secondary | ICD-10-CM

## 2014-01-25 DIAGNOSIS — N058 Unspecified nephritic syndrome with other morphologic changes: Secondary | ICD-10-CM

## 2014-01-25 DIAGNOSIS — I10 Essential (primary) hypertension: Secondary | ICD-10-CM

## 2014-01-25 DIAGNOSIS — F0390 Unspecified dementia without behavioral disturbance: Secondary | ICD-10-CM

## 2014-01-25 DIAGNOSIS — E0821 Diabetes mellitus due to underlying condition with diabetic nephropathy: Secondary | ICD-10-CM

## 2014-01-25 DIAGNOSIS — N039 Chronic nephritic syndrome with unspecified morphologic changes: Secondary | ICD-10-CM

## 2014-01-25 DIAGNOSIS — F32A Depression, unspecified: Secondary | ICD-10-CM

## 2014-01-25 DIAGNOSIS — R05 Cough: Secondary | ICD-10-CM

## 2014-01-25 DIAGNOSIS — F3289 Other specified depressive episodes: Secondary | ICD-10-CM

## 2014-01-25 NOTE — Assessment & Plan Note (Signed)
Takes Lantus 35units, Novolog 5 units with meals. Reasonably controlled. Hgb A1c 7.6 12/01/13

## 2014-01-25 NOTE — Progress Notes (Signed)
Patient ID: [name] [name], male   DOB: March 01, 1920, 80 y.o.   MRN: [id number]   Code Status: DNR  Allergies  Allergen Reactions  . Enalapril Other (See Comments)    cough  . Milk-Related Compounds Other (See Comments)    Doesn't remember but its a mild reaction    Chief Complaint  Patient presents with  . Medical Management of Chronic Issues  . Acute Visit    UTI    HPI: Patient is a 80 y.o. male seen in the SNF at [facility] today for evaluation of UTI and chronic medical conditions.  Problem List Items Addressed This Visit   Diabetes mellitus (Chronic)     Takes Lantus 35units, Novolog 5 units with meals. Reasonably controlled. Hgb A1c 7.6 12/01/13     Duodenal ulcer     Stable on Protonix 40mg  bid, no c/o stomach pain, indigestion, or melena.        CHF (congestive heart failure)     compensated on Furosemide 20mg , trace BLE edema-chronic. CXR 06/29/13 showed no pulmonary vascular congestion.     CKD (chronic kidney disease)     Persisted with creatinine 1.5-2.0.  off Losartan and started Metoprolol.      Depression     Worse. 01/11/14 wife was tearful and stated [name] yelled at her. She said he has been getting angry easily, throwing objects to staff, and verbally threatening staff. Increased Cymbata to 60mg  since 01/11/14-improving.     Dementia     Stable on Namenda. Continue SNF for care needs.       Essential hypertension, benign     Controlled on Metoprolol 25mg  bid. off Losartan due to CKD. Creatinine 1.5-2.0        Unspecified hypothyroidism     Corrected with Levothyroxine 9mcg, TSH 2.55 12/01/13 and 2.83 01/12/14     Anemia     Hgb 8s. Continue monthly Darbepoetin Alfa inj    Urinary tract infection, site not specified - Primary     01/25/14 P. Mirabilis Cipro 250mg  bid x 10 days.      Cough     Multiple factorials: GERD, ? Chronic bronchitis, CHF-better with  Spiriva, Advair, off Tussionex.          Review of Systems:    Review of Systems  Constitutional: Negative for fever, chills, weight loss, malaise/fatigue and diaphoresis.  HENT: Positive for hearing loss. Negative for congestion, ear pain and sore throat.   Eyes: Negative for pain, discharge and redness.  Respiratory: Positive for cough and sputum production. Negative for shortness of breath and wheezing.        Clear sputum. Associated with swallowing at times.  Cardiovascular: Positive for leg swelling (trace). Negative for chest pain, palpitations, orthopnea, claudication and PND.  Gastrointestinal: Negative for heartburn, abdominal pain, diarrhea, constipation and blood in stool.  Genitourinary: Positive for frequency. Negative for dysuria, urgency, hematuria and flank pain.  Musculoskeletal: Positive for back pain and joint pain. Negative for myalgias and neck pain.  Skin: Negative for itching and rash.       Small open area right buttock.   Neurological: Negative for dizziness, tingling, tremors, sensory change, speech change, focal weakness, seizures, loss of consciousness, weakness and headaches.  Endo/Heme/Allergies: Negative for environmental allergies and polydipsia. Does not bruise/bleed easily.  Psychiatric/Behavioral: Positive for depression and memory loss. Negative for hallucinations. The patient is nervous/anxious. The patient does not have insomnia.        Angry, throwing objects at  staff, verbally threatening to others.      Past Medical History  Diagnosis Date  . Hypothyroidism   . Hypertension   . CHF (congestive heart failure)   . Diabetes mellitus   . Coronary artery disease   . Restless leg syndrome   . Hyperlipemia   . Renal disease     stage II  . Sleep apnea   . Cellulitis and abscess of leg   . Obesity   . Duodenal ulcer, perforated   . Diverticulosis   . Internal hemorrhoids   . CAD (coronary artery disease)   . Anemia   . Duodenal ulcer   . Allergy   . Blood transfusion   . Cataract   . Tuberculosis      as a child  . Contact dermatitis and other eczema, due to unspecified cause 10/06/2012  . Senile dementia with depressive features 05/26/2012  . Hyposmolality and/or hyponatremia 04/02/2012  . Diarrhea 03/26/2012  . Hemorrhage of rectum and anus 03/25/2012  . Pneumonia, organism unspecified 03/12/2012  . 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 Folate-deficiency anemia 01/07/2012  . Thrombocytopenia, unspecified 09/26/2011  . Loss of weight 09/11/2011  . Dysphagia, unspecified(787.20) 06/28/2011  . Chronic kidney disease, stage III (moderate) 03/22/2011  . Cramp of limb 08/10/2010  . Chronic kidney disease, stage II (mild) 03/22/2011  . Personal history of fall 05/05/2009  . Unspecified vitamin D deficiency 02/24/2009  . Blood in stool 11/18/2008  . Dermatophytosis of the body 06/23/2007  . Obesity, unspecified 06/23/2007  . Unspecified tinnitus 06/23/2007  . Edema 06/23/2007  . Shortness of breath 06/23/2007  . Flatulence, eructation, and gas pain 06/23/2007  . Other general symptoms(780.99) 06/23/2007  . Memory loss 08/13/2003   Past Surgical History  Procedure Laterality Date  . Laparotomy  09/12/2011    Procedure: EXPLORATORY LAPAROTOMY;  Surgeon: Judieth Keens, DO;  Location: [facility];  Service: General;  Laterality: N/A;  REPAIR PERFORATED ULCER  . Gastrostomy  09/12/2011    Procedure: GASTROSTOMY;  Surgeon: Judieth Keens, DO;  Location: [facility];  Service: General;  Laterality: N/A;  BIOPSY DUODENAL ULCER  . Shoulder surgery  [place], [place]  . Jejunostomy feeding tube      removed  . Back surgery  1942  . Eye surgery  [phone]    cataracts  . Coronary artery bypass graft  2201   Social History:   reports that he has quit smoking. His smoking use included Cigarettes. He smoked 0.00 packs per day. He has never used smokeless tobacco. He reports that he does not drink alcohol or use illicit drugs.  Family History  Problem  Relation Age of Onset  . Stroke Father     Medications: Patient's Medications  New Prescriptions   No medications on file  Previous Medications   ACETAMINOPHEN (TYLENOL) 325 MG TABLET    Take 650 mg by mouth every 4 (four) hours as needed.   CALCIUM CARBONATE-VITAMIN D (CALCIUM 600+D) 600-400 MG-UNIT PER TABLET    Take 1 tablet by mouth daily.   CHOLECALCIFEROL (VITAMIN D) 1000 UNITS TABLET    Take 1,000 Units by mouth daily. Take 1 tablet daily for vitamin D supplement.   DARBEPOETIN ALFA-ALBUMIN (ARANESP IJ)    Inject as directed. 10 to be given in [place] office @@ [facility].   DULOXETINE (CYMBALTA) 60 MG CAPSULE    Take 60 mg by mouth daily.   FLUTICASONE-SALMETEROL (ADVAIR HFA) 45-21  MCG/ACT INHALER    Inhale 2 puffs into the lungs 2 (two) times daily.   FOLIC ACID (FOLVITE) 1 MG TABLET    Take 1 mg by mouth daily.    FUROSEMIDE (LASIX) 20 MG TABLET    Take 20 mg by mouth daily.    GUAIFENESIN (ROBITUSSIN) 100 MG/5ML LIQUID    Take 200 mg by mouth every 6 (six) hours as needed for cough.   INSULIN ASPART (NOVOLOG) 100 UNIT/ML INJECTION    Inject 5 Units into the skin 3 (three) times daily before meals. For CBG>150   INSULIN GLARGINE (LANTUS) 100 UNIT/ML INJECTION    Inject 35 Units into the skin at bedtime.    LEVOTHYROXINE (SYNTHROID, LEVOTHROID) 75 MCG TABLET    Take 75 mcg by mouth daily. Take 1 tablet daily  for thyroid supplement.   MEMANTINE (NAMENDA) 10 MG TABLET    Take 28 mg by mouth daily. Take 1 tablet twice daily for dementia/Alzheimer's disease.   METOPROLOL TARTRATE (LOPRESSOR) 25 MG TABLET    Take 25 mg by mouth 2 (two) times daily.   OMEGA-3 FATTY ACIDS (FISH OIL) 1200 MG CAPS    Take 1 capsule by mouth daily.   OMEPRAZOLE (PRILOSEC) 20 MG CAPSULE    Take 1 capsule (20 mg total) by mouth daily.   PANTOPRAZOLE (PROTONIX) 40 MG TABLET    Take 40 mg by mouth daily.   PROTEIN SUPPLEMENT (RESOURCE BENEPROTEIN) POWD    Take 1 scoop by mouth 2 (two) times daily.    TIOTROPIUM (SPIRIVA) 18 MCG INHALATION CAPSULE    Place 18 mcg into inhaler and inhale daily. 2 puffs daily   VITAMIN B-12 (CYANOCOBALAMIN) 1000 MCG TABLET    Take 1,000 mcg by mouth daily.  Modified Medications   No medications on file  Discontinued Medications   No medications on file     Physical Exam: Physical Exam  Constitutional: He is oriented to person, place, and time. He appears well-developed and well-nourished. No distress.  HENT:  Head: Normocephalic and atraumatic.  Right Ear: External ear normal.  Left Ear: External ear normal.  Nose: Nose normal.  Mouth/Throat: Oropharynx is clear and moist. No oropharyngeal exudate.  Eyes: Conjunctivae and EOM are normal. Pupils are equal, round, and reactive to light.  Neck: Normal range of motion. Neck supple. No JVD present. No thyromegaly present.  Cardiovascular: Normal rate, regular rhythm and normal heart sounds.   No murmur heard. Pulmonary/Chest: Effort normal. No respiratory distress. He has no wheezes. He has rales in the right lower field and the left lower field. He exhibits no tenderness.  Bibasilar as prior.   Abdominal: Soft. Bowel sounds are normal. He exhibits no distension and no mass. There is no tenderness. There is no rebound.  Musculoskeletal: Normal range of motion. He exhibits edema (trace in BLE).  Lymphadenopathy:    He has no cervical adenopathy.  Neurological: He is alert and oriented to person, place, and time. He has normal reflexes. No cranial nerve deficit. Coordination normal.  Skin: Skin is warm and dry. No rash noted. He is not diaphoretic. No erythema.  Small open area right buttock.  Psychiatric: Thought content normal. His mood appears anxious. His affect is angry and inappropriate. His affect is not blunt and not labile. His speech is not delayed, not tangential and not slurred. He is agitated and aggressive. He is not hyperactive, not slowed, not withdrawn, not actively hallucinating and not  combative. Cognition and memory are impaired. He exhibits  a depressed mood. He is communicative. He exhibits abnormal recent memory. He is attentive.    Filed Vitals:   01/25/14 1336  BP: 112/68  Pulse: 80  Temp: 97.8 F (36.6 C)  TempSrc: Tympanic  Resp: 16   Labs reviewed: Basic Metabolic Panel:  Recent Labs  06/30/13  09/10/13 1249 10/21/13 1254 10/23/13 12/01/13 01/12/14  NA 142  < > 135* 133* 137  --  135*  K 5.3  < > 5.8* 6.1 No visable hemolysis* 4.4  --  4.5  CO2  --   --  23 20*  --   --   --   GLUCOSE  --   --  165* 178*  --   --   --   BUN 46*  < > 41.4* 45.4* 46*  --  46*  CREATININE 2.0*  < > 1.8* 1.8* 1.7*  --  1.9*  CALCIUM  --   --  9.6 9.1  --   --   --   TSH 1.89  --   --   --   --  2.55 2.83  < > = values in this interval not displayed. Liver Function Tests:  Recent Labs  06/30/13 09/10/13 1249 01/12/14  AST 9* 11 9*  ALT 8* 10 8*  ALKPHOS 66 84 66  BILITOT  --  0.28  --   PROT  --  6.6  --   ALBUMIN  --  3.3*  --    CBC:  Recent Labs  11/18/13 0841  12/16/13 0944 01/12/14 01/13/14 0930  WBC 4.3  < > 4.5 3.8 5.0  NEUTROABS 2.8  --  3.1  --  3.3  HGB 9.3*  < > 9.3* 8.5* 9.1*  HCT 28.0*  < > 28.0* 25* 27.0*  MCV 114.3*  --  111.1*  --  110.7*  PLT 63*  < > 71* 67* 51*  < > = values in this interval not displayed. Past Procedures:  CXR 06/29/13 showed there is mild patchy atelectasis or interstitial pneumonitis at the right lung base appearing significantly improved.   Assessment/Plan Urinary tract infection, site not specified 01/25/14 P. Mirabilis Cipro 250mg  bid x 10 days.    CKD (chronic kidney disease) Persisted with creatinine 1.5-2.0.  off Losartan and started Metoprolol.    Anemia Hgb 8s. Continue monthly Darbepoetin Alfa inj  Diabetes mellitus Takes Lantus 35units, Novolog 5 units with meals. Reasonably controlled. Hgb A1c 7.6 12/01/13   Duodenal ulcer Stable on Protonix 40mg  bid, no c/o stomach pain, indigestion, or  melena.      Unspecified hypothyroidism Corrected with Levothyroxine 70mcg, TSH 2.55 12/01/13 and 2.83 01/12/14   Depression Worse. 01/11/14 wife was tearful and stated Mr. Eland yelled at her. She said he has been getting angry easily, throwing objects to staff, and verbally threatening staff. Increased Cymbata to 60mg  since 01/11/14-improving.   CHF (congestive heart failure) compensated on Furosemide 20mg , trace BLE edema-chronic. CXR 06/29/13 showed no pulmonary vascular congestion.   Dementia Stable on Namenda. Continue SNF for care needs.     Essential hypertension, benign Controlled on Metoprolol 25mg  bid. off Losartan due to CKD. Creatinine 1.5-2.0      Cough Multiple factorials: GERD, ? Chronic bronchitis, CHF-better with  Spiriva, Advair, off Tussionex.       Family/ Staff Communication: observe the patient  Goals of Care: SNF  Labs/tests ordered: urine culture done 01/25/14

## 2014-01-25 NOTE — Assessment & Plan Note (Signed)
Persisted with creatinine 1.5-2.0.  off Losartan and started Metoprolol.

## 2014-01-25 NOTE — Assessment & Plan Note (Signed)
01/25/14 P. Mirabilis Cipro 250mg  bid x 10 days.

## 2014-01-25 NOTE — Assessment & Plan Note (Addendum)
Hgb 8s. Continue monthly Darbepoetin Alfa inj

## 2014-01-25 NOTE — Assessment & Plan Note (Signed)
Corrected with Levothyroxine 38mcg, TSH 2.55 12/01/13 and 2.83 01/12/14

## 2014-01-25 NOTE — Assessment & Plan Note (Signed)
Worse. 01/11/14 wife was tearful and stated [name] [name] yelled at her. She said he has been getting angry easily, throwing objects to staff, and verbally threatening staff. Increased Cymbata to 60mg  since 01/11/14-improving.

## 2014-01-25 NOTE — Assessment & Plan Note (Signed)
Stable on Namenda. Continue SNF for care needs.

## 2014-01-25 NOTE — Assessment & Plan Note (Signed)
compensated on Furosemide 20mg , trace BLE edema-chronic. CXR 06/29/13 showed no pulmonary vascular congestion.

## 2014-01-25 NOTE — Assessment & Plan Note (Signed)
Multiple factorials: GERD, ? Chronic bronchitis, CHF-better with  Spiriva, Advair, off Tussionex.

## 2014-01-25 NOTE — Assessment & Plan Note (Signed)
Stable on Protonix 40mg  bid, no c/o stomach pain, indigestion, or melena.

## 2014-01-25 NOTE — Assessment & Plan Note (Signed)
Controlled on Metoprolol 25mg bid. off Losartan due to CKD. Creatinine 1.5-2.0.  

## 2014-02-10 ENCOUNTER — Other Ambulatory Visit: Payer: Self-pay | Admitting: Nurse Practitioner

## 2014-02-10 ENCOUNTER — Ambulatory Visit (HOSPITAL_BASED_OUTPATIENT_CLINIC_OR_DEPARTMENT_OTHER): Payer: Medicare Other

## 2014-02-10 ENCOUNTER — Other Ambulatory Visit (HOSPITAL_BASED_OUTPATIENT_CLINIC_OR_DEPARTMENT_OTHER): Payer: Medicare Other

## 2014-02-10 VITALS — BP 140/55 | HR 82 | Temp 98.8°F

## 2014-02-10 DIAGNOSIS — D631 Anemia in chronic kidney disease: Secondary | ICD-10-CM

## 2014-02-10 DIAGNOSIS — N039 Chronic nephritic syndrome with unspecified morphologic changes: Secondary | ICD-10-CM

## 2014-02-10 DIAGNOSIS — N189 Chronic kidney disease, unspecified: Secondary | ICD-10-CM

## 2014-02-10 DIAGNOSIS — D649 Anemia, unspecified: Secondary | ICD-10-CM

## 2014-02-10 LAB — BASIC METABOLIC PANEL (CC13)
Anion Gap: 9 mEq/L (ref 3–11)
BUN: 41.4 mg/dL — ABNORMAL HIGH (ref 7.0–26.0)
CALCIUM: 9.1 mg/dL (ref 8.4–10.4)
CHLORIDE: 103 meq/L (ref 98–109)
CO2: 24 mEq/L (ref 22–29)
Creatinine: 1.8 mg/dL — ABNORMAL HIGH (ref 0.7–1.3)
Glucose: 146 mg/dl — ABNORMAL HIGH (ref 70–140)
Potassium: 5.3 mEq/L — ABNORMAL HIGH (ref 3.5–5.1)
SODIUM: 135 meq/L — AB (ref 136–145)

## 2014-02-10 LAB — CBC WITH DIFFERENTIAL/PLATELET
BASO%: 0.2 % (ref 0.0–2.0)
Basophils Absolute: 0 10*3/uL (ref 0.0–0.1)
EOS ABS: 0.1 10*3/uL (ref 0.0–0.5)
EOS%: 2.5 % (ref 0.0–7.0)
HCT: 27.5 % — ABNORMAL LOW (ref 38.4–49.9)
HGB: 9.1 g/dL — ABNORMAL LOW (ref 13.0–17.1)
LYMPH%: 24.4 % (ref 14.0–49.0)
MCH: 37.4 pg — ABNORMAL HIGH (ref 27.2–33.4)
MCHC: 33.1 g/dL (ref 32.0–36.0)
MCV: 113.2 fL — ABNORMAL HIGH (ref 79.3–98.0)
MONO#: 0.3 10*3/uL (ref 0.1–0.9)
MONO%: 5.9 % (ref 0.0–14.0)
NEUT%: 67 % (ref 39.0–75.0)
NEUTROS ABS: 3.6 10*3/uL (ref 1.5–6.5)
NRBC: 0 % (ref 0–0)
Platelets: 71 10*3/uL — ABNORMAL LOW (ref 140–400)
RBC: 2.43 10*6/uL — AB (ref 4.20–5.82)
RDW: 14.5 % (ref 11.0–14.6)
WBC: 5.3 10*3/uL (ref 4.0–10.3)
lymph#: 1.3 10*3/uL (ref 0.9–3.3)

## 2014-02-10 LAB — CBC AND DIFFERENTIAL
HEMATOCRIT: 28 % — AB (ref 41–53)
Hemoglobin: 9.1 g/dL — AB (ref 13.5–17.5)
PLATELETS: 71 10*3/uL — AB (ref 150–399)
WBC: 5.3 10^3/mL

## 2014-02-10 MED ORDER — DARBEPOETIN ALFA-POLYSORBATE 500 MCG/ML IJ SOLN
500.0000 ug | Freq: Once | INTRAMUSCULAR | Status: AC
  Administered 2014-02-10: 500 ug via SUBCUTANEOUS
  Filled 2014-02-10: qty 1

## 2014-02-24 ENCOUNTER — Non-Acute Institutional Stay (SKILLED_NURSING_FACILITY): Payer: Medicare Other | Admitting: Nurse Practitioner

## 2014-02-24 ENCOUNTER — Encounter: Payer: Self-pay | Admitting: Nurse Practitioner

## 2014-02-24 DIAGNOSIS — I10 Essential (primary) hypertension: Secondary | ICD-10-CM

## 2014-02-24 DIAGNOSIS — F0391 Unspecified dementia with behavioral disturbance: Secondary | ICD-10-CM

## 2014-02-24 DIAGNOSIS — K269 Duodenal ulcer, unspecified as acute or chronic, without hemorrhage or perforation: Secondary | ICD-10-CM

## 2014-02-24 DIAGNOSIS — R059 Cough, unspecified: Secondary | ICD-10-CM

## 2014-02-24 DIAGNOSIS — E039 Hypothyroidism, unspecified: Secondary | ICD-10-CM

## 2014-02-24 DIAGNOSIS — F3289 Other specified depressive episodes: Secondary | ICD-10-CM

## 2014-02-24 DIAGNOSIS — R05 Cough: Secondary | ICD-10-CM

## 2014-02-24 DIAGNOSIS — F03918 Unspecified dementia, unspecified severity, with other behavioral disturbance: Secondary | ICD-10-CM

## 2014-02-24 DIAGNOSIS — F329 Major depressive disorder, single episode, unspecified: Secondary | ICD-10-CM

## 2014-02-24 DIAGNOSIS — F32A Depression, unspecified: Secondary | ICD-10-CM

## 2014-02-24 NOTE — Assessment & Plan Note (Signed)
Controlled on Metoprolol 25mg bid. off Losartan due to CKD. Creatinine 1.5-2.0.  

## 2014-02-24 NOTE — Progress Notes (Signed)
Patient ID: [name] [name], male   DOB: [birth year], 80 y.o.   MRN: [id number]   Code Status: DNR  Allergies  Allergen Reactions  . Enalapril Other (See Comments)    cough  . Milk-Related Compounds Other (See Comments)    Doesn't remember but its a mild reaction    Chief Complaint  Patient presents with  . Medical Management of Chronic Issues    HPI: Patient is a 80 y.o. male seen in the SNF at [facility] today for evaluation of chronic medical conditions.  Problem List Items Addressed This Visit   Duodenal ulcer - Primary     Stable, takes Omeprazole 20mg  daily, no c/o stomach pain, indigestion, or melena.        Depression     Stabilized since Cymbalta was increased to 60mg  01/11/14. 01/11/14 wife was tearful and stated [name] [name] yelled at her. She said he has been getting angry easily, throwing objects to staff, and verbally threatening staff.     Dementia     Stable on Namenda. Continue SNF for care needs.      Essential hypertension, benign     Controlled on Metoprolol 25mg  bid. off Losartan due to CKD. Creatinine 1.5-2.0      Unspecified hypothyroidism     Corrected with Levothyroxine 25mcg, TSH 2.55 12/01/13 and 2.83 01/12/14     Cough     ultiple factorials: GERD, ? Chronic bronchitis, CHF-better with  Spiriva, Advair, off Tussionex.           Review of Systems:  Review of Systems  Constitutional: Negative for fever, chills, weight loss, malaise/fatigue and diaphoresis.  HENT: Positive for hearing loss. Negative for congestion, ear pain and sore throat.   Eyes: Negative for pain, discharge and redness.  Respiratory: Positive for cough and sputum production. Negative for shortness of breath and wheezing.        Clear sputum. Associated with swallowing at times.  Cardiovascular: Positive for leg swelling (trace). Negative for chest pain, palpitations, orthopnea, claudication and PND.  Gastrointestinal: Negative for heartburn, abdominal pain,  diarrhea, constipation and blood in stool.  Genitourinary: Positive for frequency. Negative for dysuria, urgency, hematuria and flank pain.  Musculoskeletal: Positive for back pain and joint pain. Negative for myalgias and neck pain.  Skin: Negative for itching and rash.       Small open area right buttock.   Neurological: Negative for dizziness, tingling, tremors, sensory change, speech change, focal weakness, seizures, loss of consciousness, weakness and headaches.  Endo/Heme/Allergies: Negative for environmental allergies and polydipsia. Does not bruise/bleed easily.  Psychiatric/Behavioral: Positive for depression and memory loss. Negative for hallucinations. The patient is nervous/anxious. The patient does not have insomnia.        Angry, throwing objects at staff, verbally threatening to others.      Past Medical History  Diagnosis Date  . Hypothyroidism   . Hypertension   . CHF (congestive heart failure)   . Diabetes mellitus   . Coronary artery disease   . Restless leg syndrome   . Hyperlipemia   . Renal disease     stage II  . Sleep apnea   . Cellulitis and abscess of leg   . Obesity   . Duodenal ulcer, perforated   . Diverticulosis   . Internal hemorrhoids   . CAD (coronary artery disease)   . Anemia   . Duodenal ulcer   . Allergy   . Blood transfusion   . Cataract   .  Tuberculosis     as a child  . Contact dermatitis and other eczema, due to unspecified cause 10/06/2012  . Senile dementia with depressive features 05/26/2012  . Hyposmolality and/or hyponatremia 04/02/2012  . Diarrhea 03/26/2012  . Hemorrhage of rectum and anus 03/25/2012  . Pneumonia, organism unspecified 03/12/2012  . 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 Folate-deficiency anemia 01/07/2012  . Thrombocytopenia, unspecified 09/26/2011  . Loss of weight 09/11/2011  . Dysphagia, unspecified(787.20) 06/28/2011  . Chronic kidney disease, stage III (moderate) 03/22/2011    . Cramp of limb 08/10/2010  . Chronic kidney disease, stage II (mild) 03/22/2011  . Personal history of fall 05/05/2009  . Unspecified vitamin D deficiency 02/24/2009  . Blood in stool 11/18/2008  . Dermatophytosis of the body 06/23/2007  . Obesity, unspecified 06/23/2007  . Unspecified tinnitus 06/23/2007  . Edema 06/23/2007  . Shortness of breath 06/23/2007  . Flatulence, eructation, and gas pain 06/23/2007  . Other general symptoms(780.99) 06/23/2007  . Memory loss 08/13/2003   Past Surgical History  Procedure Laterality Date  . Laparotomy  09/12/2011    Procedure: EXPLORATORY LAPAROTOMY;  Surgeon: Judieth Keens, DO;  Location: [facility];  Service: General;  Laterality: N/A;  REPAIR PERFORATED ULCER  . Gastrostomy  09/12/2011    Procedure: GASTROSTOMY;  Surgeon: Judieth Keens, DO;  Location: [facility];  Service: General;  Laterality: N/A;  BIOPSY DUODENAL ULCER  . Shoulder surgery  [place], [place]  . Jejunostomy feeding tube      removed  . Back surgery  1942  . Eye surgery  [phone]    cataracts  . Coronary artery bypass graft  2201   Social History:   reports that he has quit smoking. His smoking use included Cigarettes. He smoked 0.00 packs per day. He has never used smokeless tobacco. He reports that he does not drink alcohol or use illicit drugs.  Family History  Problem Relation Age of Onset  . Stroke Father     Medications: Patient's Medications  New Prescriptions   No medications on file  Previous Medications   ACETAMINOPHEN (TYLENOL) 325 MG TABLET    Take 650 mg by mouth every 4 (four) hours as needed.   CALCIUM CARBONATE-VITAMIN D (CALCIUM 600+D) 600-400 MG-UNIT PER TABLET    Take 1 tablet by mouth daily.   CHOLECALCIFEROL (VITAMIN D) 1000 UNITS TABLET    Take 1,000 Units by mouth daily. Take 1 tablet daily for vitamin D supplement.   DARBEPOETIN ALFA-ALBUMIN (ARANESP IJ)    Inject as directed. 10 to be given in [place] office @@ [facility].   DULOXETINE (CYMBALTA) 60 MG CAPSULE    Take 60 mg by mouth daily.   FLUTICASONE-SALMETEROL (ADVAIR HFA) 45-21 MCG/ACT INHALER    Inhale 2 puffs into the lungs 2 (two) times daily.   FOLIC ACID (FOLVITE) 1 MG TABLET    Take 1 mg by mouth daily.    FUROSEMIDE (LASIX) 20 MG TABLET    Take 20 mg by mouth daily.    GUAIFENESIN (ROBITUSSIN) 100 MG/5ML LIQUID    Take 200 mg by mouth every 6 (six) hours as needed for cough.   INSULIN ASPART (NOVOLOG) 100 UNIT/ML INJECTION    Inject 5 Units into the skin 3 (three) times daily before meals. For CBG>150   INSULIN GLARGINE (LANTUS) 100 UNIT/ML INJECTION    Inject 35 Units into the skin at bedtime.    LEVOTHYROXINE (SYNTHROID, LEVOTHROID) 75  MCG TABLET    Take 75 mcg by mouth daily. Take 1 tablet daily  for thyroid supplement.   MEMANTINE (NAMENDA) 10 MG TABLET    Take 28 mg by mouth daily. Take 1 tablet twice daily for dementia/Alzheimer's disease.   METOPROLOL TARTRATE (LOPRESSOR) 25 MG TABLET    Take 25 mg by mouth 2 (two) times daily.   OMEGA-3 FATTY ACIDS (FISH OIL) 1200 MG CAPS    Take 1 capsule by mouth daily.   OMEPRAZOLE (PRILOSEC) 20 MG CAPSULE    Take 1 capsule (20 mg total) by mouth daily.   PANTOPRAZOLE (PROTONIX) 40 MG TABLET    Take 40 mg by mouth daily.   PROTEIN SUPPLEMENT (RESOURCE BENEPROTEIN) POWD    Take 1 scoop by mouth 2 (two) times daily.   TIOTROPIUM (SPIRIVA) 18 MCG INHALATION CAPSULE    Place 18 mcg into inhaler and inhale daily. 2 puffs daily   VITAMIN B-12 (CYANOCOBALAMIN) 1000 MCG TABLET    Take 1,000 mcg by mouth daily.  Modified Medications   No medications on file  Discontinued Medications   No medications on file     Physical Exam: Physical Exam  Constitutional: He is oriented to person, place, and time. He appears well-developed and well-nourished. No distress.  HENT:  Head: Normocephalic and atraumatic.  Right Ear: External ear normal.  Left Ear: External ear normal.  Nose: Nose normal.  Mouth/Throat:  Oropharynx is clear and moist. No oropharyngeal exudate.  Eyes: Conjunctivae and EOM are normal. Pupils are equal, round, and reactive to light.  Neck: Normal range of motion. Neck supple. No JVD present. No thyromegaly present.  Cardiovascular: Normal rate, regular rhythm and normal heart sounds.   No murmur heard. Pulmonary/Chest: Effort normal. No respiratory distress. He has no wheezes. He has rales in the right lower field and the left lower field. He exhibits no tenderness.  Bibasilar as prior.   Abdominal: Soft. Bowel sounds are normal. He exhibits no distension and no mass. There is no tenderness. There is no rebound.  Musculoskeletal: Normal range of motion. He exhibits edema (trace in BLE).  Lymphadenopathy:    He has no cervical adenopathy.  Neurological: He is alert and oriented to person, place, and time. He has normal reflexes. No cranial nerve deficit. Coordination normal.  Skin: Skin is warm and dry. No rash noted. He is not diaphoretic. No erythema.  Small open area right buttock.  Psychiatric: Thought content normal. His mood appears anxious. His affect is angry and inappropriate. His affect is not blunt and not labile. His speech is not delayed, not tangential and not slurred. He is agitated and aggressive. He is not hyperactive, not slowed, not withdrawn, not actively hallucinating and not combative. Cognition and memory are impaired. He exhibits a depressed mood. He is communicative. He exhibits abnormal recent memory. He is attentive.    Filed Vitals:   02/24/14 1243  BP: 116/61  Pulse: 82  Temp: 99.9 F (37.7 C)  TempSrc: Tympanic  Resp: 16   Labs reviewed: Basic Metabolic Panel:  Recent Labs  06/30/13  09/10/13 1249 10/21/13 1254 10/23/13 12/01/13 01/12/14 02/10/14 1001  NA 142  < > 135* 133* 137  --  135* 135*  K 5.3  < > 5.8* 6.1 No visable hemolysis* 4.4  --  4.5 5.3*  CO2  --   --  23 20*  --   --   --  24  GLUCOSE  --   --  165* 178*  --   --   --  146*  BUN 46*  < > 41.4* 45.4* 46*  --  46* 41.4*  CREATININE 2.0*  < > 1.8* 1.8* 1.7*  --  1.9* 1.8*  CALCIUM  --   --  9.6 9.1  --   --   --  9.1  TSH 1.89  --   --   --   --  2.55 2.83  --   < > = values in this interval not displayed. Liver Function Tests:  Recent Labs  06/30/13 09/10/13 1249 01/12/14  AST 9* 11 9*  ALT 8* 10 8*  ALKPHOS 66 84 66  BILITOT  --  0.28  --   PROT  --  6.6  --   ALBUMIN  --  3.3*  --    CBC:  Recent Labs  12/16/13 0944  01/13/14 0930 02/10/14 02/10/14 1001  WBC 4.5  < > 5.0 5.3 5.3  NEUTROABS 3.1  --  3.3  --  3.6  HGB 9.3*  < > 9.1* 9.1* 9.1*  HCT 28.0*  < > 27.0* 28* 27.5*  MCV 111.1*  --  110.7*  --  113.2*  PLT 71*  < > 51* 71* 71*  < > = values in this interval not displayed. Past Procedures:  CXR 06/29/13 showed there is mild patchy atelectasis or interstitial pneumonitis at the right lung base appearing significantly improved.   Assessment/Plan Diabetes mellitus Takes Lantus 35units, Novolog 5 units with meals. Reasonably controlled. Hgb A1c 7.6 12/01/13    Duodenal ulcer Stable, takes Omeprazole 20mg  daily, no c/o stomach pain, indigestion, or melena.      Unspecified hypothyroidism Corrected with Levothyroxine 28mcg, TSH 2.55 12/01/13 and 2.83 01/12/14   Depression Stabilized since Cymbalta was increased to 60mg  01/11/14. 01/11/14 wife was tearful and stated Mr. Bisig yelled at her. She said he has been getting angry easily, throwing objects to staff, and verbally threatening staff.   Essential hypertension, benign Controlled on Metoprolol 25mg  bid. off Losartan due to CKD. Creatinine 1.5-2.0    Anemia in chronic kidney disease Darbepoetin Alfa inj [facility]. Dx anemia in chronic kidney disease.  Last Hgb 9.1 10/21/13 and 9.3 11/19/13 and 9.1 02/10/14. Improved from 7s. Continue po Folic acid and Vit B71.     Dementia Stable on Namenda. Continue SNF for care needs.    CHF (congestive heart  failure) compensated on Furosemide 20mg , trace BLE edema-chronic. CXR 06/29/13 showed no pulmonary vascular congestion.    Cough ultiple factorials: GERD, ? Chronic bronchitis, CHF-better with  Spiriva, Advair, off Tussionex.        Family/ Staff Communication: observe the patient  Goals of Care: SNF  Labs/tests ordered: none

## 2014-02-24 NOTE — Assessment & Plan Note (Signed)
compensated on Furosemide 20mg , trace BLE edema-chronic. CXR 06/29/13 showed no pulmonary vascular congestion.

## 2014-02-24 NOTE — Assessment & Plan Note (Signed)
Corrected with Levothyroxine 12mcg, TSH 2.55 12/01/13 and 2.83 01/12/14

## 2014-02-24 NOTE — Assessment & Plan Note (Signed)
ultiple factorials: GERD, ? Chronic bronchitis, CHF-better with  Spiriva, Advair, off Tussionex.

## 2014-02-24 NOTE — Assessment & Plan Note (Signed)
Stable on Namenda. Continue SNF for care needs.

## 2014-02-24 NOTE — Assessment & Plan Note (Signed)
Takes Lantus 35units, Novolog 5 units with meals. Reasonably controlled. Hgb A1c 7.6 12/01/13

## 2014-02-24 NOTE — Assessment & Plan Note (Signed)
Stabilized since Cymbalta was increased to 60mg  01/11/14. 01/11/14 wife was tearful and stated [name] [name] yelled at her. She said he has been getting angry easily, throwing objects to staff, and verbally threatening staff.

## 2014-02-24 NOTE — Assessment & Plan Note (Signed)
Stable, takes Omeprazole 20mg daily, no c/o stomach pain, indigestion, or melena.    

## 2014-02-24 NOTE — Assessment & Plan Note (Signed)
Darbepoetin Alfa inj [facility]. Dx anemia in chronic kidney disease.  Last Hgb 9.1 10/21/13 and 9.3 11/19/13 and 9.1 02/10/14. Improved from 7s. Continue po Folic acid and Vit O45.

## 2014-03-10 ENCOUNTER — Ambulatory Visit (HOSPITAL_BASED_OUTPATIENT_CLINIC_OR_DEPARTMENT_OTHER): Payer: Medicare Other

## 2014-03-10 ENCOUNTER — Other Ambulatory Visit (HOSPITAL_BASED_OUTPATIENT_CLINIC_OR_DEPARTMENT_OTHER): Payer: Medicare Other

## 2014-03-10 VITALS — BP 105/53 | HR 60 | Temp 98.7°F

## 2014-03-10 DIAGNOSIS — N189 Chronic kidney disease, unspecified: Secondary | ICD-10-CM

## 2014-03-10 DIAGNOSIS — D649 Anemia, unspecified: Secondary | ICD-10-CM

## 2014-03-10 DIAGNOSIS — N039 Chronic nephritic syndrome with unspecified morphologic changes: Secondary | ICD-10-CM

## 2014-03-10 DIAGNOSIS — D631 Anemia in chronic kidney disease: Secondary | ICD-10-CM

## 2014-03-10 LAB — CBC WITH DIFFERENTIAL/PLATELET
BASO%: 0.2 % (ref 0.0–2.0)
Basophils Absolute: 0 10*3/uL (ref 0.0–0.1)
EOS%: 1.3 % (ref 0.0–7.0)
Eosinophils Absolute: 0.1 10*3/uL (ref 0.0–0.5)
HCT: 26 % — ABNORMAL LOW (ref 38.4–49.9)
HGB: 8.6 g/dL — ABNORMAL LOW (ref 13.0–17.1)
LYMPH%: 26.2 % (ref 14.0–49.0)
MCH: 37.7 pg — ABNORMAL HIGH (ref 27.2–33.4)
MCHC: 33.1 g/dL (ref 32.0–36.0)
MCV: 114 fL — ABNORMAL HIGH (ref 79.3–98.0)
MONO#: 0.3 10*3/uL (ref 0.1–0.9)
MONO%: 5.7 % (ref 0.0–14.0)
NEUT#: 3 10*3/uL (ref 1.5–6.5)
NEUT%: 66.6 % (ref 39.0–75.0)
NRBC: 0 % (ref 0–0)
PLATELETS: 71 10*3/uL — AB (ref 140–400)
RBC: 2.28 10*6/uL — AB (ref 4.20–5.82)
RDW: 14.3 % (ref 11.0–14.6)
WBC: 4.6 10*3/uL (ref 4.0–10.3)
lymph#: 1.2 10*3/uL (ref 0.9–3.3)

## 2014-03-10 LAB — CBC AND DIFFERENTIAL
HEMATOCRIT: 26 % — AB (ref 41–53)
HEMOGLOBIN: 8.6 g/dL — AB (ref 13.5–17.5)
Platelets: 71 10*3/uL — AB (ref 150–399)
WBC: 4.6 10^3/mL

## 2014-03-10 MED ORDER — DARBEPOETIN ALFA-POLYSORBATE 500 MCG/ML IJ SOLN
500.0000 ug | Freq: Once | INTRAMUSCULAR | Status: AC
  Administered 2014-03-10: 500 ug via SUBCUTANEOUS
  Filled 2014-03-10: qty 1

## 2014-03-15 ENCOUNTER — Other Ambulatory Visit: Payer: Self-pay | Admitting: Nurse Practitioner

## 2014-03-22 ENCOUNTER — Encounter: Payer: Self-pay | Admitting: Nurse Practitioner

## 2014-03-22 ENCOUNTER — Non-Acute Institutional Stay (SKILLED_NURSING_FACILITY): Payer: Medicare Other | Admitting: Nurse Practitioner

## 2014-03-22 DIAGNOSIS — N039 Chronic nephritic syndrome with unspecified morphologic changes: Secondary | ICD-10-CM

## 2014-03-22 DIAGNOSIS — M79672 Pain in left foot: Secondary | ICD-10-CM | POA: Insufficient documentation

## 2014-03-22 DIAGNOSIS — F0390 Unspecified dementia without behavioral disturbance: Secondary | ICD-10-CM

## 2014-03-22 DIAGNOSIS — F329 Major depressive disorder, single episode, unspecified: Secondary | ICD-10-CM

## 2014-03-22 DIAGNOSIS — R059 Cough, unspecified: Secondary | ICD-10-CM

## 2014-03-22 DIAGNOSIS — R05 Cough: Secondary | ICD-10-CM

## 2014-03-22 DIAGNOSIS — D631 Anemia in chronic kidney disease: Secondary | ICD-10-CM

## 2014-03-22 DIAGNOSIS — I10 Essential (primary) hypertension: Secondary | ICD-10-CM

## 2014-03-22 DIAGNOSIS — F32A Depression, unspecified: Secondary | ICD-10-CM

## 2014-03-22 DIAGNOSIS — F3289 Other specified depressive episodes: Secondary | ICD-10-CM

## 2014-03-22 DIAGNOSIS — M79609 Pain in unspecified limb: Secondary | ICD-10-CM

## 2014-03-22 DIAGNOSIS — E039 Hypothyroidism, unspecified: Secondary | ICD-10-CM

## 2014-03-22 DIAGNOSIS — K269 Duodenal ulcer, unspecified as acute or chronic, without hemorrhage or perforation: Secondary | ICD-10-CM

## 2014-03-22 DIAGNOSIS — N189 Chronic kidney disease, unspecified: Secondary | ICD-10-CM

## 2014-03-22 NOTE — Assessment & Plan Note (Signed)
New, sudden onset, mid plantar aspect left lateral mid foot pain with weight bearing and ambulation. Small bruised area noted between left malleolus and the foot pain area. The patient has no recollection of the injury. Will have X-ray L foot 3 views to r/o fx. The patient declined analgesics for pain.

## 2014-03-22 NOTE — Assessment & Plan Note (Signed)
Controlled on Metoprolol 25mg bid. off Losartan due to CKD. Creatinine 1.5-2.0.  

## 2014-03-22 NOTE — Assessment & Plan Note (Signed)
Stable, takes Omeprazole 20mg daily, no c/o stomach pain, indigestion, or melena.    

## 2014-03-22 NOTE — Progress Notes (Signed)
Patient ID: [name] [name], male   DOB: 1920-07-08, 81 y.o.   MRN: [id number]   Code Status: DNR  Allergies  Allergen Reactions  . Enalapril Other (See Comments)    cough  . Milk-Related Compounds Other (See Comments)    Doesn't remember but its a mild reaction    Chief Complaint  Patient presents with  . Medical Management of Chronic Issues  . Acute Visit    left lateral mid foot pain.     HPI: Patient is a 80 y.o. male seen in the SNF at [facility] today for evaluation of left lateral mid foot pain, cough, and chronic medical conditions.  Problem List Items Addressed This Visit   Anemia in chronic kidney disease     Darbepoetin Alfa inj [facility]-last 01/13/14 02/10/14 Hgb 9.1 03/10/14 Hgb 8.6     Cough     Multiple factorials: GERD, ? Chronic bronchitis, CHF-takes  Spiriva, Advair, off Tussionex. Worse-Tussionex 81ml bid x 2 weeks. Avelox 400mg  daily x 10 days. CXR 03/20/14 unchanged pleural thickening vs small pleural effusion in the left lower lung. The previous described patchy atelectasis or interstitial pneumonitis in the right lung has resolved.       Dementia     Stable on Namenda. Continue SNF for care needs.       Depression     Stabilized since Cymbalta was increased to 60mg  01/11/14. 01/11/14 wife was tearful and stated [name] [name] yelled at her. She said he has been getting angry easily, throwing objects to staff, and verbally threatening staff.      Duodenal ulcer     Stable, takes Omeprazole 20mg  daily, no c/o stomach pain, indigestion, or melena.         Essential hypertension, benign     Controlled on Metoprolol 25mg  bid. off Losartan due to CKD. Creatinine 1.5-2.0      Foot pain, left - Primary     New, sudden onset, mid plantar aspect left lateral mid foot pain with weight bearing and ambulation. Small bruised area noted between left malleolus and the foot pain area. The patient has no recollection of the injury. Will have X-ray L  foot 3 views to r/o fx. The patient declined analgesics for pain.     Unspecified hypothyroidism     Corrected with Levothyroxine 80mcg, TSH 2.55 12/01/13 and 2.83 01/12/14         Review of Systems:  Review of Systems  Constitutional: Negative for fever, chills, weight loss, malaise/fatigue and diaphoresis.  HENT: Positive for hearing loss. Negative for congestion, ear pain and sore throat.   Eyes: Negative for pain, discharge and redness.  Respiratory: Positive for cough and sputum production. Negative for shortness of breath and wheezing.        Clear sputum. Associated with swallowing at times. Worsened cough.  Cardiovascular: Positive for leg swelling (trace). Negative for chest pain, palpitations, orthopnea, claudication and PND.  Gastrointestinal: Negative for heartburn, abdominal pain, diarrhea, constipation and blood in stool.  Genitourinary: Positive for frequency. Negative for dysuria, urgency, hematuria and flank pain.  Musculoskeletal: Positive for back pain and joint pain. Negative for myalgias and neck pain.       New left lateral foot pain-small bruise anterior left lateral malleolus. The most painful area is at lateral mid left foot plantar aspect. Worse with weight bearing-but still able to ambulate.   Skin: Negative for itching and rash.       Small open area right buttock.  Neurological: Negative for dizziness, tingling, tremors, sensory change, speech change, focal weakness, seizures, loss of consciousness, weakness and headaches.  Endo/Heme/Allergies: Negative for environmental allergies and polydipsia. Does not bruise/bleed easily.  Psychiatric/Behavioral: Positive for depression and memory loss. Negative for hallucinations. The patient is nervous/anxious. The patient does not have insomnia.        Angry, throwing objects at staff, verbally threatening to others.      Past Medical History  Diagnosis Date  . Hypothyroidism   . Hypertension   . CHF (congestive  heart failure)   . Diabetes mellitus   . Coronary artery disease   . Restless leg syndrome   . Hyperlipemia   . Renal disease     stage II  . Sleep apnea   . Cellulitis and abscess of leg   . Obesity   . Duodenal ulcer, perforated   . Diverticulosis   . Internal hemorrhoids   . CAD (coronary artery disease)   . Anemia   . Duodenal ulcer   . Allergy   . Blood transfusion   . Cataract   . Tuberculosis     as a child  . Contact dermatitis and other eczema, due to unspecified cause 10/06/2012  . Senile dementia with depressive features 05/26/2012  . Hyposmolality and/or hyponatremia 04/02/2012  . Diarrhea 03/26/2012  . Hemorrhage of rectum and anus 03/25/2012  . Pneumonia, organism unspecified 03/12/2012  . 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 Folate-deficiency anemia 01/07/2012  . Thrombocytopenia, unspecified 09/26/2011  . Loss of weight 09/11/2011  . Dysphagia, unspecified(787.20) 06/28/2011  . Chronic kidney disease, stage III (moderate) 03/22/2011  . Cramp of limb 08/10/2010  . Chronic kidney disease, stage II (mild) 03/22/2011  . Personal history of fall 05/05/2009  . Unspecified vitamin D deficiency 02/24/2009  . Blood in stool 11/18/2008  . Dermatophytosis of the body 06/23/2007  . Obesity, unspecified 06/23/2007  . Unspecified tinnitus 06/23/2007  . Edema 06/23/2007  . Shortness of breath 06/23/2007  . Flatulence, eructation, and gas pain 06/23/2007  . Other general symptoms(780.99) 06/23/2007  . Memory loss 08/13/2003   Past Surgical History  Procedure Laterality Date  . Laparotomy  09/12/2011    Procedure: EXPLORATORY LAPAROTOMY;  Surgeon: Judieth Keens, DO;  Location: [facility];  Service: General;  Laterality: N/A;  REPAIR PERFORATED ULCER  . Gastrostomy  09/12/2011    Procedure: GASTROSTOMY;  Surgeon: Judieth Keens, DO;  Location: [facility];  Service: General;  Laterality: N/A;  BIOPSY DUODENAL ULCER  . Shoulder surgery  [place], [place]  . Jejunostomy feeding tube      removed  . Back surgery  1942  . Eye surgery  [phone]    cataracts  . Coronary artery bypass graft  2201   Social History:   reports that he has quit smoking. His smoking use included Cigarettes. He smoked 0.00 packs per day. He has never used smokeless tobacco. He reports that he does not drink alcohol or use illicit drugs.  Family History  Problem Relation Age of Onset  . Stroke Father     Medications: Patient's Medications  New Prescriptions   No medications on file  Previous Medications   ACETAMINOPHEN (TYLENOL) 325 MG TABLET    Take 650 mg by mouth every 4 (four) hours as needed.   CALCIUM CARBONATE-VITAMIN D (CALCIUM 600+D) 600-400 MG-UNIT PER TABLET    Take 1 tablet by mouth daily.   CHOLECALCIFEROL (VITAMIN D) 1000 UNITS  TABLET    Take 1,000 Units by mouth daily. Take 1 tablet daily for vitamin D supplement.   DARBEPOETIN ALFA-ALBUMIN (ARANESP IJ)    Inject as directed. 10 to be given in [place] office @@ [facility].   DULOXETINE (CYMBALTA) 60 MG CAPSULE    Take 60 mg by mouth daily.   FLUTICASONE-SALMETEROL (ADVAIR HFA) 45-21 MCG/ACT INHALER    Inhale 2 puffs into the lungs 2 (two) times daily.   FOLIC ACID (FOLVITE) 1 MG TABLET    Take 1 mg by mouth daily.    FUROSEMIDE (LASIX) 20 MG TABLET    Take 20 mg by mouth daily.    GUAIFENESIN (ROBITUSSIN) 100 MG/5ML LIQUID    Take 200 mg by mouth every 6 (six) hours as needed for cough.   INSULIN ASPART (NOVOLOG) 100 UNIT/ML INJECTION    Inject 5 Units into the skin 3 (three) times daily before meals. For CBG>150   INSULIN GLARGINE (LANTUS) 100 UNIT/ML INJECTION    Inject 35 Units into the skin at bedtime.    LEVOTHYROXINE (SYNTHROID, LEVOTHROID) 75 MCG TABLET    Take 75 mcg by mouth daily. Take 1 tablet daily  for thyroid supplement.   MEMANTINE (NAMENDA) 10 MG TABLET    Take 28 mg by mouth daily. Take 1 tablet twice daily for dementia/Alzheimer's disease.    METOPROLOL TARTRATE (LOPRESSOR) 25 MG TABLET    Take 25 mg by mouth 2 (two) times daily.   OMEGA-3 FATTY ACIDS (FISH OIL) 1200 MG CAPS    Take 1 capsule by mouth daily.   OMEPRAZOLE (PRILOSEC) 20 MG CAPSULE    Take 1 capsule (20 mg total) by mouth daily.   PANTOPRAZOLE (PROTONIX) 40 MG TABLET    Take 40 mg by mouth daily.   PROTEIN SUPPLEMENT (RESOURCE BENEPROTEIN) POWD    Take 1 scoop by mouth 2 (two) times daily.   TIOTROPIUM (SPIRIVA) 18 MCG INHALATION CAPSULE    Place 18 mcg into inhaler and inhale daily. 2 puffs daily   VITAMIN B-12 (CYANOCOBALAMIN) 1000 MCG TABLET    Take 1,000 mcg by mouth daily.  Modified Medications   No medications on file  Discontinued Medications   No medications on file     Physical Exam: Physical Exam  Constitutional: He is oriented to person, place, and time. He appears well-developed and well-nourished. No distress.  HENT:  Head: Normocephalic and atraumatic.  Right Ear: External ear normal.  Left Ear: External ear normal.  Nose: Nose normal.  Mouth/Throat: Oropharynx is clear and moist. No oropharyngeal exudate.  Eyes: Conjunctivae and EOM are normal. Pupils are equal, round, and reactive to light.  Neck: Normal range of motion. Neck supple. No JVD present. No thyromegaly present.  Cardiovascular: Normal rate, regular rhythm and normal heart sounds.   No murmur heard. Pulmonary/Chest: Effort normal. No respiratory distress. He has no wheezes. He has rales in the right lower field and the left lower field. He exhibits no tenderness.  Bibasilar as prior.   Abdominal: Soft. Bowel sounds are normal. He exhibits no distension and no mass. There is no tenderness. There is no rebound.  Musculoskeletal: Normal range of motion. He exhibits edema (trace in BLE) and tenderness.  New left lateral foot pain-small bruise anterior left lateral malleolus. The most painful area is at lateral mid left foot plantar aspect. Worse with weight bearing-but still able to  ambulate.   Lymphadenopathy:    He has no cervical adenopathy.  Neurological: He is alert and  oriented to person, place, and time. He has normal reflexes. No cranial nerve deficit. Coordination normal.  Skin: Skin is warm and dry. No rash noted. He is not diaphoretic. No erythema.  Small open area right buttock.  Psychiatric: Thought content normal. His mood appears anxious. His affect is angry and inappropriate. His affect is not blunt and not labile. His speech is not delayed, not tangential and not slurred. He is agitated and aggressive. He is not hyperactive, not slowed, not withdrawn, not actively hallucinating and not combative. Cognition and memory are impaired. He exhibits a depressed mood. He is communicative. He exhibits abnormal recent memory. He is attentive.    Filed Vitals:   03/22/14 1259  BP: 120/78  Pulse: 80  Temp: 97.4 F (36.3 C)  TempSrc: Tympanic  Resp: 18   Labs reviewed: Basic Metabolic Panel:  Recent Labs  06/30/13  09/10/13 1249 10/21/13 1254 10/23/13 12/01/13 01/12/14 02/10/14 1001  NA 142  < > 135* 133* 137  --  135* 135*  K 5.3  < > 5.8* 6.1 No visable hemolysis* 4.4  --  4.5 5.3*  CO2  --   --  23 20*  --   --   --  24  GLUCOSE  --   --  165* 178*  --   --   --  146*  BUN 46*  < > 41.4* 45.4* 46*  --  46* 41.4*  CREATININE 2.0*  < > 1.8* 1.8* 1.7*  --  1.9* 1.8*  CALCIUM  --   --  9.6 9.1  --   --   --  9.1  TSH 1.89  --   --   --   --  2.55 2.83  --   < > = values in this interval not displayed. Liver Function Tests:  Recent Labs  06/30/13 09/10/13 1249 01/12/14  AST 9* 11 9*  ALT 8* 10 8*  ALKPHOS 66 84 66  BILITOT  --  0.28  --   PROT  --  6.6  --   ALBUMIN  --  3.3*  --    CBC:  Recent Labs  01/13/14 0930  02/10/14 1001 03/10/14 03/10/14 1004  WBC 5.0  < > 5.3 4.6 4.6  NEUTROABS 3.3  --  3.6  --  3.0  HGB 9.1*  < > 9.1* 8.6* 8.6*  HCT 27.0*  < > 27.5* 26* 26.0*  MCV 110.7*  --  113.2*  --  114.0*  PLT 51*  < > 71* 71* 71*    < > = values in this interval not displayed. Past Procedures:  CXR 06/29/13 showed there is mild patchy atelectasis or interstitial pneumonitis at the right lung base appearing significantly improved.   Assessment/Plan Foot pain, left New, sudden onset, mid plantar aspect left lateral mid foot pain with weight bearing and ambulation. Small bruised area noted between left malleolus and the foot pain area. The patient has no recollection of the injury. Will have X-ray L foot 3 views to r/o fx. The patient declined analgesics for pain.   Cough Multiple factorials: GERD, ? Chronic bronchitis, CHF-takes  Spiriva, Advair, off Tussionex. Worse-Tussionex 50ml bid x 2 weeks. Avelox 400mg  daily x 10 days. CXR 03/20/14 unchanged pleural thickening vs small pleural effusion in the left lower lung. The previous described patchy atelectasis or interstitial pneumonitis in the right lung has resolved.     Duodenal ulcer Stable, takes Omeprazole 20mg  daily, no c/o stomach pain, indigestion, or melena.  Diabetes mellitus Takes Lantus 35units, Novolog 5 units with meals. Reasonably controlled. Hgb A1c 7.6 12/01/13     Unspecified hypothyroidism Corrected with Levothyroxine 8mcg, TSH 2.55 12/01/13 and 2.83 01/12/14    Depression Stabilized since Cymbalta was increased to 60mg  01/11/14. 01/11/14 wife was tearful and stated [name] [name] yelled at her. She said he has been getting angry easily, throwing objects to staff, and verbally threatening staff.    Dementia Stable on Namenda. Continue SNF for care needs.     Essential hypertension, benign Controlled on Metoprolol 25mg  bid. off Losartan due to CKD. Creatinine 1.5-2.0    CHF (congestive heart failure) compensated on Furosemide 20mg , trace BLE edema-chronic. CXR 06/29/13 showed no pulmonary vascular congestion.     Anemia in chronic kidney disease Darbepoetin Alfa inj [facility]-last 01/13/14 02/10/14 Hgb 9.1 03/10/14 Hgb  8.6     Family/ Staff Communication: observe the patient  Goals of Care: SNF  Labs/tests ordered: CXR done 03/20/14. L foot X-ray 3 views pending.

## 2014-03-22 NOTE — Assessment & Plan Note (Signed)
Stabilized since Cymbalta was increased to 60mg  01/11/14. 01/11/14 wife was tearful and stated [name] [name] yelled at her. She said he has been getting angry easily, throwing objects to staff, and verbally threatening staff.

## 2014-03-22 NOTE — Assessment & Plan Note (Signed)
Darbepoetin Alfa inj [facility]-last 01/13/14 02/10/14 Hgb 9.1 03/10/14 Hgb 8.6

## 2014-03-22 NOTE — Assessment & Plan Note (Signed)
Corrected with Levothyroxine 79mcg, TSH 2.55 12/01/13 and 2.83 01/12/14

## 2014-03-22 NOTE — Assessment & Plan Note (Signed)
Takes Lantus 35units, Novolog 5 units with meals. Reasonably controlled. Hgb A1c 7.6 12/01/13

## 2014-03-22 NOTE — Assessment & Plan Note (Signed)
compensated on Furosemide 20mg , trace BLE edema-chronic. CXR 06/29/13 showed no pulmonary vascular congestion.

## 2014-03-22 NOTE — Assessment & Plan Note (Signed)
Multiple factorials: GERD, ? Chronic bronchitis, CHF-takes  Spiriva, Advair, off Tussionex. Worse-Tussionex 65ml bid x 2 weeks. Avelox 400mg  daily x 10 days. CXR 03/20/14 unchanged pleural thickening vs small pleural effusion in the left lower lung. The previous described patchy atelectasis or interstitial pneumonitis in the right lung has resolved.

## 2014-03-22 NOTE — Assessment & Plan Note (Signed)
Stable on Namenda. Continue SNF for care needs.

## 2014-03-24 ENCOUNTER — Encounter: Payer: Self-pay | Admitting: Nurse Practitioner

## 2014-03-24 DIAGNOSIS — S92355A Nondisplaced fracture of fifth metatarsal bone, left foot, initial encounter for closed fracture: Secondary | ICD-10-CM | POA: Insufficient documentation

## 2014-04-07 ENCOUNTER — Encounter: Payer: Self-pay | Admitting: Nurse Practitioner

## 2014-04-07 ENCOUNTER — Telehealth: Payer: Self-pay | Admitting: Hematology

## 2014-04-07 ENCOUNTER — Ambulatory Visit (HOSPITAL_BASED_OUTPATIENT_CLINIC_OR_DEPARTMENT_OTHER): Payer: Medicare Other | Admitting: Hematology

## 2014-04-07 ENCOUNTER — Other Ambulatory Visit (HOSPITAL_BASED_OUTPATIENT_CLINIC_OR_DEPARTMENT_OTHER): Payer: Medicare Other

## 2014-04-07 ENCOUNTER — Non-Acute Institutional Stay (SKILLED_NURSING_FACILITY): Payer: Medicare Other | Admitting: Nurse Practitioner

## 2014-04-07 ENCOUNTER — Ambulatory Visit (HOSPITAL_BASED_OUTPATIENT_CLINIC_OR_DEPARTMENT_OTHER): Payer: Medicare Other

## 2014-04-07 VITALS — BP 96/42 | HR 81 | Temp 98.0°F | Resp 18 | Ht 68.0 in | Wt 239.6 lb

## 2014-04-07 DIAGNOSIS — F329 Major depressive disorder, single episode, unspecified: Secondary | ICD-10-CM

## 2014-04-07 DIAGNOSIS — D631 Anemia in chronic kidney disease: Secondary | ICD-10-CM

## 2014-04-07 DIAGNOSIS — N189 Chronic kidney disease, unspecified: Secondary | ICD-10-CM

## 2014-04-07 DIAGNOSIS — N039 Chronic nephritic syndrome with unspecified morphologic changes: Secondary | ICD-10-CM

## 2014-04-07 DIAGNOSIS — J208 Acute bronchitis due to other specified organisms: Secondary | ICD-10-CM

## 2014-04-07 DIAGNOSIS — F0390 Unspecified dementia without behavioral disturbance: Secondary | ICD-10-CM

## 2014-04-07 DIAGNOSIS — D649 Anemia, unspecified: Secondary | ICD-10-CM

## 2014-04-07 DIAGNOSIS — E039 Hypothyroidism, unspecified: Secondary | ICD-10-CM

## 2014-04-07 DIAGNOSIS — S92355S Nondisplaced fracture of fifth metatarsal bone, left foot, sequela: Secondary | ICD-10-CM

## 2014-04-07 DIAGNOSIS — D6489 Other specified anemias: Secondary | ICD-10-CM

## 2014-04-07 DIAGNOSIS — S8290XS Unspecified fracture of unspecified lower leg, sequela: Secondary | ICD-10-CM

## 2014-04-07 DIAGNOSIS — J209 Acute bronchitis, unspecified: Secondary | ICD-10-CM

## 2014-04-07 DIAGNOSIS — F32A Depression, unspecified: Secondary | ICD-10-CM

## 2014-04-07 DIAGNOSIS — I10 Essential (primary) hypertension: Secondary | ICD-10-CM

## 2014-04-07 DIAGNOSIS — F3289 Other specified depressive episodes: Secondary | ICD-10-CM

## 2014-04-07 DIAGNOSIS — K269 Duodenal ulcer, unspecified as acute or chronic, without hemorrhage or perforation: Secondary | ICD-10-CM

## 2014-04-07 DIAGNOSIS — N183 Chronic kidney disease, stage 3 (moderate): Secondary | ICD-10-CM

## 2014-04-07 LAB — BASIC METABOLIC PANEL (CC13)
Anion Gap: 8 mEq/L (ref 3–11)
BUN: 33.7 mg/dL — AB (ref 7.0–26.0)
CALCIUM: 8.8 mg/dL (ref 8.4–10.4)
CHLORIDE: 101 meq/L (ref 98–109)
CO2: 24 meq/L (ref 22–29)
Creatinine: 1.6 mg/dL — ABNORMAL HIGH (ref 0.7–1.3)
Glucose: 185 mg/dl — ABNORMAL HIGH (ref 70–140)
Potassium: 5.3 mEq/L — ABNORMAL HIGH (ref 3.5–5.1)
Sodium: 133 mEq/L — ABNORMAL LOW (ref 136–145)

## 2014-04-07 LAB — CBC WITH DIFFERENTIAL/PLATELET
BASO%: 0.2 % (ref 0.0–2.0)
Basophils Absolute: 0 10*3/uL (ref 0.0–0.1)
EOS ABS: 0.1 10*3/uL (ref 0.0–0.5)
EOS%: 1.7 % (ref 0.0–7.0)
HEMATOCRIT: 24.6 % — AB (ref 38.4–49.9)
HGB: 8.1 g/dL — ABNORMAL LOW (ref 13.0–17.1)
LYMPH%: 23.9 % (ref 14.0–49.0)
MCH: 37.9 pg — ABNORMAL HIGH (ref 27.2–33.4)
MCHC: 32.9 g/dL (ref 32.0–36.0)
MCV: 115 fL — ABNORMAL HIGH (ref 79.3–98.0)
MONO#: 0.3 10*3/uL (ref 0.1–0.9)
MONO%: 6.8 % (ref 0.0–14.0)
NEUT#: 2.8 10*3/uL (ref 1.5–6.5)
NEUT%: 67.4 % (ref 39.0–75.0)
PLATELETS: 72 10*3/uL — AB (ref 140–400)
RBC: 2.14 10*6/uL — AB (ref 4.20–5.82)
RDW: 14.3 % (ref 11.0–14.6)
WBC: 4.1 10*3/uL (ref 4.0–10.3)
lymph#: 1 10*3/uL (ref 0.9–3.3)
nRBC: 0 % (ref 0–0)

## 2014-04-07 LAB — CBC AND DIFFERENTIAL
HCT: 25 % — AB (ref 41–53)
Hemoglobin: 8.1 g/dL — AB (ref 13.5–17.5)
Platelets: 72 10*3/uL — AB (ref 150–399)
WBC: 4.1 10^3/mL

## 2014-04-07 MED ORDER — DARBEPOETIN ALFA-POLYSORBATE 500 MCG/ML IJ SOLN
500.0000 ug | Freq: Once | INTRAMUSCULAR | Status: AC
  Administered 2014-04-07: 500 ug via SUBCUTANEOUS
  Filled 2014-04-07: qty 1

## 2014-04-07 NOTE — Progress Notes (Signed)
Patient ID: PASHA BROAD, male   DOB: [birth year], 80 y.o.   MRN: [id number]   Code Status: DNR  Allergies  Allergen Reactions  . Enalapril Other (See Comments)    cough  . Milk-Related Compounds Other (See Comments)    Doesn't remember but its a mild reaction    Chief Complaint  Patient presents with  . Medical Management of Chronic Issues  . Acute Visit    persisted cough    HPI: Patient is a 80 y.o. male seen in the SNF at [facility] today for evaluation of persisted cough and chronic medical conditions.  Problem List Items Addressed This Visit   Duodenal ulcer     Stable, takes Omeprazole 20mg  daily, no c/o stomach pain, indigestion, or melena.        Depression     Stabilized since Cymbalta was increased to 60mg  01/11/14. 01/11/14 wife was tearful and stated Mr. Vanhouten yelled at her. She said he has been getting angry easily, throwing objects to staff, and verbally threatening staff.       Dementia     Stable on Namenda. Continue SNF for care needs.     Essential hypertension, benign     Controlled on Metoprolol 25mg  bid. off Losartan due to CKD. Creatinine 1.5-2.0. Update CMP     Unspecified hypothyroidism     Corrected with Levothyroxine 70mcg, TSH 2.55 12/01/13 and 2.83 01/12/14. Update TSH       Anemia in chronic kidney disease     Darbepoetin Alfa inj [facility]-last 01/13/14 02/10/14 Hgb 9.1 03/10/14 Hgb 8.6      Acute bronchitis     Multiple factorials: GERD, ? Chronic bronchitis, CHF-takes  Spiriva, Advair, off Tussionex. Persisted cough-completed Tussionex 40ml bid x 2 weeks. Avelox 400mg  daily x 10 days(completed 03/31/14). CXR 03/20/14 unchanged pleural thickening vs small pleural effusion in the left lower lung. The previous described patchy atelectasis or interstitial pneumonitis in the right lung has resolved.  04/07/14 repeat CXR. Continue Tussionex bid x 4 weeks. ST to eval cough associated with swallowing.  04/07/14 CXR no significant  interval change, no ne active cardiopulmonary disease.  04/08/14 add Medrol dose pk.          Closed nondisplaced fracture of fifth left metatarsal bone - Primary     03/22/14 X-ray the left fifth metatarsal base fracture-complete or near complete, but nondisplaced transverse to oblique fracture through the fifth metatarsal base.  04/07/14 improving         Review of Systems:  Review of Systems  Constitutional: Negative for fever, chills, weight loss, malaise/fatigue and diaphoresis.  HENT: Positive for hearing loss. Negative for congestion, ear pain and sore throat.   Eyes: Negative for pain, discharge and redness.  Respiratory: Positive for cough and sputum production. Negative for shortness of breath and wheezing.        Clear sputum. Associated with swallowing at times. Worsened cough.  Cardiovascular: Positive for leg swelling (trace). Negative for chest pain, palpitations, orthopnea, claudication and PND.  Gastrointestinal: Negative for heartburn, abdominal pain, diarrhea, constipation and blood in stool.  Genitourinary: Positive for frequency. Negative for dysuria, urgency, hematuria and flank pain.  Musculoskeletal: Positive for back pain and joint pain. Negative for myalgias and neck pain.       New left lateral foot pain-small bruise anterior left lateral malleolus. The most painful area is at lateral mid left foot plantar aspect. Worse with weight bearing-but still able to ambulate.   Skin:  Negative for itching and rash.       Small open area right buttock.   Neurological: Negative for dizziness, tingling, tremors, sensory change, speech change, focal weakness, seizures, loss of consciousness, weakness and headaches.  Endo/Heme/Allergies: Negative for environmental allergies and polydipsia. Does not bruise/bleed easily.  Psychiatric/Behavioral: Positive for depression and memory loss. Negative for hallucinations. The patient is nervous/anxious. The patient does not have  insomnia.        Angry, throwing objects at staff, verbally threatening to others.      Past Medical History  Diagnosis Date  . Hypothyroidism   . Hypertension   . CHF (congestive heart failure)   . Diabetes mellitus   . Coronary artery disease   . Restless leg syndrome   . Hyperlipemia   . Renal disease     stage II  . Sleep apnea   . Cellulitis and abscess of leg   . Obesity   . Duodenal ulcer, perforated   . Diverticulosis   . Internal hemorrhoids   . CAD (coronary artery disease)   . Anemia   . Duodenal ulcer   . Allergy   . Blood transfusion   . Cataract   . Tuberculosis     as a child  . Contact dermatitis and other eczema, due to unspecified cause 10/06/2012  . Senile dementia with depressive features 05/26/2012  . Hyposmolality and/or hyponatremia 04/02/2012  . Diarrhea 03/26/2012  . Hemorrhage of rectum and anus 03/25/2012  . Pneumonia, organism unspecified 03/12/2012  . 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 Folate-deficiency anemia 01/07/2012  . Thrombocytopenia, unspecified 09/26/2011  . Loss of weight 09/11/2011  . Dysphagia, unspecified(787.20) 06/28/2011  . Chronic kidney disease, stage III (moderate) 03/22/2011  . Cramp of limb 08/10/2010  . Chronic kidney disease, stage II (mild) 03/22/2011  . Personal history of fall 05/05/2009  . Unspecified vitamin D deficiency 02/24/2009  . Blood in stool 11/18/2008  . Dermatophytosis of the body 06/23/2007  . Obesity, unspecified 06/23/2007  . Unspecified tinnitus 06/23/2007  . Edema 06/23/2007  . Shortness of breath 06/23/2007  . Flatulence, eructation, and gas pain 06/23/2007  . Other general symptoms(780.99) 06/23/2007  . Memory loss 08/13/2003   Past Surgical History  Procedure Laterality Date  . Laparotomy  09/12/2011    Procedure: EXPLORATORY LAPAROTOMY;  Surgeon: Judieth Keens, DO;  Location: [facility];  Service: General;  Laterality: N/A;  REPAIR PERFORATED ULCER  .  Gastrostomy  09/12/2011    Procedure: GASTROSTOMY;  Surgeon: Judieth Keens, DO;  Location: [facility];  Service: General;  Laterality: N/A;  BIOPSY DUODENAL ULCER  . Shoulder surgery  [place], [place]  . Jejunostomy feeding tube      removed  . Back surgery  1942  . Eye surgery  [phone]    cataracts  . Coronary artery bypass graft  2201   Social History:   reports that he has quit smoking. His smoking use included Cigarettes. He smoked 0.00 packs per day. He has never used smokeless tobacco. He reports that he does not drink alcohol or use illicit drugs.  Family History  Problem Relation Age of Onset  . Stroke Father     Medications: Patient's Medications  New Prescriptions   No medications on file  Previous Medications   ACETAMINOPHEN (TYLENOL) 325 MG TABLET    Take 650 mg by mouth every 4 (four) hours as needed.   CALCIUM CARBONATE-VITAMIN D (CALCIUM 600+D) 600-400 MG-UNIT  PER TABLET    Take 1 tablet by mouth daily.   CHOLECALCIFEROL (VITAMIN D) 1000 UNITS TABLET    Take 1,000 Units by mouth daily. Take 1 tablet daily for vitamin D supplement.   DARBEPOETIN ALFA-ALBUMIN (ARANESP IJ)    Inject as directed. 10 to be given in [place] office @@ [facility].   DULOXETINE (CYMBALTA) 60 MG CAPSULE    Take 60 mg by mouth daily.   FLUTICASONE-SALMETEROL (ADVAIR HFA) 45-21 MCG/ACT INHALER    Inhale 2 puffs into the lungs 2 (two) times daily.   FOLIC ACID (FOLVITE) 1 MG TABLET    Take 1 mg by mouth daily.    FUROSEMIDE (LASIX) 20 MG TABLET    Take 20 mg by mouth daily.    GUAIFENESIN (ROBITUSSIN) 100 MG/5ML LIQUID    Take 200 mg by mouth every 6 (six) hours as needed for cough.   INSULIN ASPART (NOVOLOG) 100 UNIT/ML INJECTION    Inject 5 Units into the skin 3 (three) times daily before meals. For CBG>150   INSULIN GLARGINE (LANTUS) 100 UNIT/ML INJECTION    Inject 35 Units into the skin at bedtime.    LEVOTHYROXINE (SYNTHROID, LEVOTHROID) 75 MCG TABLET    Take 75 mcg by  mouth daily. Take 1 tablet daily  for thyroid supplement.   MEMANTINE (NAMENDA) 10 MG TABLET    Take 28 mg by mouth daily. Take 1 tablet twice daily for dementia/Alzheimer's disease.   METOPROLOL TARTRATE (LOPRESSOR) 25 MG TABLET    Take 25 mg by mouth 2 (two) times daily.   OMEGA-3 FATTY ACIDS (FISH OIL) 1200 MG CAPS    Take 1 capsule by mouth daily.   OMEPRAZOLE (PRILOSEC) 20 MG CAPSULE    Take 1 capsule (20 mg total) by mouth daily.   PROTEIN SUPPLEMENT (RESOURCE BENEPROTEIN) POWD    Take 1 scoop by mouth 2 (two) times daily.   TIOTROPIUM (SPIRIVA) 18 MCG INHALATION CAPSULE    Place 18 mcg into inhaler and inhale daily. 2 puffs daily   VITAMIN B-12 (CYANOCOBALAMIN) 1000 MCG TABLET    Take 1,000 mcg by mouth daily.  Modified Medications   No medications on file  Discontinued Medications   No medications on file     Physical Exam: Physical Exam  Constitutional: He is oriented to person, place, and time. He appears well-developed and well-nourished. No distress.  HENT:  Head: Normocephalic and atraumatic.  Right Ear: External ear normal.  Left Ear: External ear normal.  Nose: Nose normal.  Mouth/Throat: Oropharynx is clear and moist. No oropharyngeal exudate.  Eyes: Conjunctivae and EOM are normal. Pupils are equal, round, and reactive to light.  Neck: Normal range of motion. Neck supple. No JVD present. No thyromegaly present.  Cardiovascular: Normal rate, regular rhythm and normal heart sounds.   No murmur heard. Pulmonary/Chest: Effort normal. No respiratory distress. He has no wheezes. He has rales in the right lower field and the left lower field. He exhibits no tenderness.  Bibasilar as prior. Diffused coarse expiratory rhonchi bilateral lungs.   Abdominal: Soft. Bowel sounds are normal. He exhibits no distension and no mass. There is no tenderness. There is no rebound.  Musculoskeletal: Normal range of motion. He exhibits edema (trace in BLE) and tenderness.  New left lateral  foot pain-small bruise anterior left lateral malleolus. The most painful area is at lateral mid left foot plantar aspect. Worse with weight bearing-but still able to ambulate.   Lymphadenopathy:    He has no  cervical adenopathy.  Neurological: He is alert and oriented to person, place, and time. He has normal reflexes. No cranial nerve deficit. Coordination normal.  Skin: Skin is warm and dry. No rash noted. He is not diaphoretic. No erythema.  Small open area right buttock.  Psychiatric: Thought content normal. His mood appears anxious. His affect is angry and inappropriate. His affect is not blunt and not labile. His speech is not delayed, not tangential and not slurred. He is agitated and aggressive. He is not hyperactive, not slowed, not withdrawn, not actively hallucinating and not combative. Cognition and memory are impaired. He exhibits a depressed mood. He is communicative. He exhibits abnormal recent memory. He is attentive.    Filed Vitals:   04/07/14 1219  BP: 120/78  Pulse: 80  Temp: 97.4 F (36.3 C)  TempSrc: Tympanic  Resp: 18   Labs reviewed: Basic Metabolic Panel:  Recent Labs  06/30/13  10/21/13 1254  12/01/13 01/12/14 02/10/14 1001 04/07/14 1002  NA 142  < > 133*  < >  --  135* 135* 133*  K 5.3  < > 6.1 No visable hemolysis*  < >  --  4.5 5.3* 5.3*  CO2  --   < > 20*  --   --   --  24 24  GLUCOSE  --   < > 178*  --   --   --  146* 185*  BUN 46*  < > 45.4*  < >  --  46* 41.4* 33.7*  CREATININE 2.0*  < > 1.8*  < >  --  1.9* 1.8* 1.6*  CALCIUM  --   < > 9.1  --   --   --  9.1 8.8  TSH 1.89  --   --   --  2.55 2.83  --   --   < > = values in this interval not displayed. Liver Function Tests:  Recent Labs  06/30/13 09/10/13 1249 01/12/14  AST 9* 11 9*  ALT 8* 10 8*  ALKPHOS 66 84 66  BILITOT  --  0.28  --   PROT  --  6.6  --   ALBUMIN  --  3.3*  --    CBC:  Recent Labs  02/10/14 1001 03/10/14 03/10/14 1004 04/07/14 1002  WBC 5.3 4.6 4.6 4.1    NEUTROABS 3.6  --  3.0 2.8  HGB 9.1* 8.6* 8.6* 8.1*  HCT 27.5* 26* 26.0* 24.6*  MCV 113.2*  --  114.0* 115.0*  PLT 71* 71* 71* 72*   Past Procedures:  CXR 06/29/13 showed there is mild patchy atelectasis or interstitial pneumonitis at the right lung base appearing significantly improved.   Assessment/Plan Closed nondisplaced fracture of fifth left metatarsal bone 03/22/14 X-ray the left fifth metatarsal base fracture-complete or near complete, but nondisplaced transverse to oblique fracture through the fifth metatarsal base.  04/07/14 improving    Acute bronchitis Multiple factorials: GERD, ? Chronic bronchitis, CHF-takes  Spiriva, Advair, off Tussionex. Persisted cough-completed Tussionex 24ml bid x 2 weeks. Avelox 400mg  daily x 10 days(completed 03/31/14). CXR 03/20/14 unchanged pleural thickening vs small pleural effusion in the left lower lung. The previous described patchy atelectasis or interstitial pneumonitis in the right lung has resolved.  04/07/14 repeat CXR. Continue Tussionex bid x 4 weeks. ST to eval cough associated with swallowing.  04/07/14 CXR no significant interval change, no ne active cardiopulmonary disease.  04/08/14 add Medrol dose pk.        Anemia in chronic kidney  disease Darbepoetin Alfa inj [facility]-last 01/13/14 02/10/14 Hgb 9.1 03/10/14 Hgb 8.6    Unspecified hypothyroidism Corrected with Levothyroxine 58mcg, TSH 2.55 12/01/13 and 2.83 01/12/14. Update TSH     Essential hypertension, benign Controlled on Metoprolol 25mg  bid. off Losartan due to CKD. Creatinine 1.5-2.0. Update CMP   Dementia Stable on Namenda. Continue SNF for care needs.   Depression Stabilized since Cymbalta was increased to 60mg  01/11/14. 01/11/14 wife was tearful and stated Mr. Eoff yelled at her. She said he has been getting angry easily, throwing objects to staff, and verbally threatening staff.     Duodenal ulcer Stable, takes Omeprazole 20mg  daily, no c/o stomach  pain, indigestion, or melena.      Diabetes mellitus Takes Lantus 35units, Novolog 5 units with meals. Reasonably controlled. Hgb A1c 7.6 12/01/13. Update Hgb a1c.         Family/ Staff Communication: observe the patient  Goals of Care: SNF  Labs/tests ordered: repeat CXR. obrain CMP, TSH, Hgb A1c.

## 2014-04-07 NOTE — Assessment & Plan Note (Signed)
Darbepoetin Alfa inj [facility]-last 01/13/14 02/10/14 Hgb 9.1 03/10/14 Hgb 8.6

## 2014-04-07 NOTE — Assessment & Plan Note (Signed)
Controlled on Metoprolol 25mg  bid. off Losartan due to CKD. Creatinine 1.5-2.0. Update CMP

## 2014-04-07 NOTE — Assessment & Plan Note (Signed)
Corrected with Levothyroxine 75mcg, TSH 2.55 12/01/13 and 2.83 01/12/14. Update TSH

## 2014-04-07 NOTE — Assessment & Plan Note (Signed)
Takes Lantus 35units, Novolog 5 units with meals. Reasonably controlled. Hgb A1c 7.6 12/01/13. Update Hgb a1c.

## 2014-04-07 NOTE — Assessment & Plan Note (Signed)
Stable, takes Omeprazole 20mg daily, no c/o stomach pain, indigestion, or melena.    

## 2014-04-07 NOTE — Assessment & Plan Note (Addendum)
Multiple factorials: GERD, ? Chronic bronchitis, CHF-takes  Spiriva, Advair, off Tussionex. Persisted cough-completed Tussionex 60ml bid x 2 weeks. Avelox 400mg  daily x 10 days(completed 03/31/14). CXR 03/20/14 unchanged pleural thickening vs small pleural effusion in the left lower lung. The previous described patchy atelectasis or interstitial pneumonitis in the right lung has resolved.  04/07/14 repeat CXR. Continue Tussionex bid x 4 weeks. ST to eval cough associated with swallowing.  04/07/14 CXR no significant interval change, no ne active cardiopulmonary disease.  04/08/14 add Medrol dose pk.

## 2014-04-07 NOTE — Assessment & Plan Note (Signed)
03/22/14 X-ray the left fifth metatarsal base fracture-complete or near complete, but nondisplaced transverse to oblique fracture through the fifth metatarsal base.  04/07/14 improving

## 2014-04-07 NOTE — Telephone Encounter (Signed)
Pt confirmed labs/ov per 09/09 POF, gave pt AVS......KJ

## 2014-04-07 NOTE — Progress Notes (Signed)
[place] OFFICE PROGRESS NOTE 04/07/2014  GREEN, [name], MD [address] [place] [address]  DIAGNOSIS: Anemia of chronic kidney disease and possible myelodysplastic syndrome.  Chief Complaint  Patient presents with  . Follow-up    CURRENT TREATMENT:  Aranesp 300 mcg every 4 weeks for hemoglobin less than 11 started on 09/23/13; increased to 500 mcg every 4 weeks in June 2015.  INTERVAL HISTORY:  [name] [name] 80 y.o. male who was referred by Dr. Nyoka Cowden for evaluation of chronic anemia and thrombocytopenia and evaluated by Dr Juliann Mule on 08/26/2013. Today, the patient is accompanied by his wife. He has dementia and provides limited history. Of note, he has Chronic kidney disease with a creatinine clearance of 30 ml/min and he resides at SNF at [facility]. He was last seen by me on 01/13/2014.   As previously reported, his recent anemia work-up revealed a Hgb of 8.3 on 09/02/2012; Normal iron 87, B12 740, Folate > 20. He has not been on iron supplementation. His hgb on 08/13/13 revealed a hgb of 8.0. GI (Dr. Norberto Sorenson T. Dagoberto Ligas. MD) was consulted on 08/26/2013 for anemia. Last upper endoscopy on 12/13/2011 revealed mild gastritis with recommendations for a PPI q am long term. The patient's review of systems for symptoms of anemia was negative. He does have memory problems.   His WBC was normal at 4.2 on 09/08/13; Hemoglobin of 8.0; Hematocrit of 23.5; MCV was 113.5; Platelet count of 65. He has hypothyroidism and is compliant with levothyroxine 75 mcg, TSH 1.894 on 06/30/2013. Examining the plt trend revealed his thrombocytopenia started around 09/12/2011 following laparotomy by Dr. Lilyan Punt and repair of perforated ulcer. He has since been on nexium 40 mg bid. Of note he takes namenda for his dementia; lasix 20 mg daily for congestive heart failure; lantus for his diabetes. He is on folic acid daily.   Today, he is accompanied by his wife.  He reports  overall doing well.  He has had some improvement with his symptoms of fatigue since starting his aranesp shots. He also takes H67 pill daily, folic acid daily, Vitamin D and calcium and fish oil.  Recently he was treated with a course of avelox for his cough and bronchitis which he completed on 03/31/2014. He still have some residual cough. He also gets Tussionex syrup for cough. A Chest Xray 03/12/2014 showed an unchanged pleural thickening versus a small left pleural effusion in left lower lung. The right sided area of patchy pneumonitis or atelectasis has resolved. I reviewed all his clinical notes and medication list from SNF.  MEDICAL HISTORY: Past Medical History  Diagnosis Date  . Hypothyroidism   . Hypertension   . CHF (congestive heart failure)   . Diabetes mellitus   . Coronary artery disease   . Restless leg syndrome   . Hyperlipemia   . Renal disease     stage II  . Sleep apnea   . Cellulitis and abscess of leg   . Obesity   . Duodenal ulcer, perforated   . Diverticulosis   . Internal hemorrhoids   . CAD (coronary artery disease)   . Anemia   . Duodenal ulcer   . Allergy   . Blood transfusion   . Cataract   . Tuberculosis     as a child  . Contact dermatitis and other eczema, due to unspecified cause 10/06/2012  . Senile dementia with depressive features 05/26/2012  . Hyposmolality and/or hyponatremia 04/02/2012  .  Diarrhea 03/26/2012  . Hemorrhage of rectum and anus 03/25/2012  . Pneumonia, organism unspecified 03/12/2012  . 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 Folate-deficiency anemia 01/07/2012  . Thrombocytopenia, unspecified 09/26/2011  . Loss of weight 09/11/2011  . Dysphagia, unspecified(787.20) 06/28/2011  . Chronic kidney disease, stage III (moderate) 03/22/2011  . Cramp of limb 08/10/2010  . Chronic kidney disease, stage II (mild) 03/22/2011  . Personal history of fall 05/05/2009  . Unspecified vitamin D deficiency 02/24/2009   . Blood in stool 11/18/2008  . Dermatophytosis of the body 06/23/2007  . Obesity, unspecified 06/23/2007  . Unspecified tinnitus 06/23/2007  . Edema 06/23/2007  . Shortness of breath 06/23/2007  . Flatulence, eructation, and gas pain 06/23/2007  . Other general symptoms(780.99) 06/23/2007  . Memory loss 08/13/2003    INTERIM HISTORY: has UNSPECIFIED ANEMIA; BLOOD IN STOOL; Duodenal ulcer; CHF (congestive heart failure); CKD (chronic kidney disease); Respiratory failure; Diabetes mellitus; Fall at nursing home; Depression; Dementia; Essential hypertension, benign; Unspecified hypothyroidism; Anemia in chronic kidney disease; Unspecified gastritis and gastroduodenitis without mention of hemorrhage; Urinary tract infection, site not specified; Cough; Thrombocytopenia, unspecified; Hyperkalemia; Foot pain, left; and Closed nondisplaced fracture of fifth left metatarsal bone on his problem list.    ALLERGIES:  is allergic to enalapril and milk-related compounds.  MEDICATIONS: has a current medication list which includes the following prescription(s): acetaminophen, calcium carbonate-vitamin d, cholecalciferol, darbepoetin alfa-albumin, duloxetine, fluticasone-salmeterol, folic acid, furosemide, guaifenesin, insulin aspart, insulin glargine, levothyroxine, memantine, metoprolol tartrate, fish oil, omeprazole, protein supplement, tiotropium, and vitamin b-12.  SURGICAL HISTORY:  Past Surgical History  Procedure Laterality Date  . Laparotomy  09/12/2011    Procedure: EXPLORATORY LAPAROTOMY;  Surgeon: Rulon Abide, DO;  Location: [facility];  Service: General;  Laterality: N/A;  REPAIR PERFORATED ULCER  . Gastrostomy  09/12/2011    Procedure: GASTROSTOMY;  Surgeon: Rulon Abide, DO;  Location: [facility];  Service: General;  Laterality: N/A;  BIOPSY DUODENAL ULCER  . Shoulder surgery  [place], [place]  . Jejunostomy feeding tube      removed  . Back surgery  1942  . Eye surgery   [phone]    cataracts  . Coronary artery bypass graft  2201    REVIEW OF SYSTEMS:   Constitutional: Denies fevers, chills or abnormal weight loss Eyes: Denies blurriness of vision Ears, nose, mouth, throat, and face: Denies mucositis or sore throat Respiratory: Denies cough, dyspnea or wheezes Cardiovascular: Denies palpitation, chest discomfort or lower extremity swelling Gastrointestinal:  Denies nausea, heartburn or change in bowel habits Skin: Denies abnormal skin rashes Lymphatics: Denies new lymphadenopathy or easy bruising Neurological:Denies numbness, tingling or new weaknesses; + Memory problems Behavioral/Psych: Mood is stable, no new changes  All other systems were reviewed with the patient and are negative.  PHYSICAL EXAMINATION: ECOG PERFORMANCE STATUS: 2 - Symptomatic, <50% confined to bed  Blood pressure 96/42, pulse 81, temperature 98 F (36.7 C), temperature source Oral, resp. rate 18, height 5\' 8"  (1.727 m), weight 239 lb 9.6 oz (108.682 kg).  GENERAL:alert, no distress and comfortable; obese sitting in wheelchair.  SKIN: skin color, texture, turgor are normal, no rashes or significant lesions  EYES: normal, Conjunctiva are pink and non-injected, sclera clear  OROPHARYNX:no exudate, no erythema and lips, buccal mucosa, and tongue normal  NECK: supple, thyroid normal size, non-tender, without nodularity  LYMPH: no palpable lymphadenopathy in the cervical, axillary or inguinal  LUNGS: Rales in the right lower field and left  lower field. and percussion with normal breathing effort  HEART: regular rate & rhythm and no murmurs and 1+ pitting edema  ABDOMEN:abdomen soft, non-tender and normal bowel sounds  Musculoskeletal:no cyanosis of digits and no clubbing  NEURO: alert & oriented x 3 with fluent speech, no focal motor/sensory deficits   Labs:        RADIOGRAPHIC STUDIES: No results found.  ASSESSMENT: [name] [name] 80 y.o. male with a history of  Anemia of chronic disease, renal failure and possibly myelodysplastic syndrome.  PLAN:   1. Anemia of mixed etiology including anemia of chronic kidney disease.  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 He had a normal MCV prior to lap secondary to perforated ulcer. His MCV was normocytic on 09/11/2012 prior to procedure. He is maintained on folic with normal folate and vitamin B-12 levels. In addition, his TSH is normal and he has normal liver function. Other considerations would be MDS, MM or other primary bone marrow bleedings. In addition, he could have some anemia related to his chronic kidney disease with creatinine clearance of 30. His EPO level is low for the degree of his anemia.   --Thus, we started aranesp 300 mcg monthly on 02/25 with a goal hemoglobin greater than 10. His hemoglobin is 9.1 today.  Given his advanced age and co-morbidities, he was less inclined to obtain a bone marrow biopsy at this point.  Based on his initial response, we will continue his aranesp at 500 mcg monthly.   --  I spoke with patient about doing a bone marrow aspirate and biopsy and we will consider that in 1-2 months if anemia becomes more severe and refractory. The advantage of that is if we confirm that he has myelodysplastic syndrome, we can offer him therapies like  Vidaza or Dacogen which are both approved for MDS and the mechanism of action is hypomethylation. If MDS is proven, we also have to risk stratify his chances of developing an acute leukemia based on the bone marrow morphology, blast count, clonal abnormalities, cytogenetics. It can also help [place] in picking the right agent for him such as Revlimid or vidaza.  2. CKDz. --His creatinine is 1.8-2.0 with creatinine clearance of 30 mL/min.  Avoid nephrotoxins.    3. Thrombocytopenia, chronic.  --He denies any symptoms of bleeding.  Platelet count is 72,000.  4. Follow-up.  --He will follow up with [place] monthly to continue aranesp 500 mcg  monthly. He will follow up in clinic in 1 month. We may have to schedule his aranesp q 3 weeks if his hemoglobin continues to trend down.  5. Nutritional deficiencies: Patient is on b12, folate and only other possibility is for him to have a vitamin c or zinc deficiency which can also contribute to anemia. I will place him on vitamin C 500 mg daily and zinc 50 mg daily and see how his hemoglobin responds to that.   All questions were answered. The patient knows to call the clinic with any problems, questions or concerns. We can certainly see the patient much sooner if necessary. He will get a flu shot at [facility].  I spent 30 minutes counseling the patient face to face. The total time spent in the appointment was 30 minutes.    Bernadene Bell, MD Medical Hematologist/Oncologist [place] Pager: [phone] Office No: [phone]

## 2014-04-07 NOTE — Patient Instructions (Signed)
WRITTEN PLAN WAS GIVEN TO PATIENT.

## 2014-04-07 NOTE — Assessment & Plan Note (Signed)
Stabilized since Cymbalta was increased to 60mg  01/11/14. 01/11/14 wife was tearful and stated Mr. Hallenbeck yelled at her. She said he has been getting angry easily, throwing objects to staff, and verbally threatening staff.

## 2014-04-07 NOTE — Assessment & Plan Note (Signed)
Stable on Namenda. Continue SNF for care needs.

## 2014-04-08 LAB — BASIC METABOLIC PANEL
BUN: 35 mg/dL — AB (ref 4–21)
CREATININE: 1.5 mg/dL — AB (ref 0.6–1.3)
GLUCOSE: 133 mg/dL
POTASSIUM: 4.5 mmol/L (ref 3.4–5.3)
SODIUM: 134 mmol/L — AB (ref 137–147)

## 2014-04-08 LAB — HEPATIC FUNCTION PANEL
ALK PHOS: 66 U/L (ref 25–125)
ALT: 8 U/L — AB (ref 10–40)
AST: 11 U/L — AB (ref 14–40)

## 2014-04-08 LAB — HEMOGLOBIN A1C: Hgb A1c MFr Bld: 7.7 % — AB (ref 4.0–6.0)

## 2014-04-08 LAB — TSH: TSH: 2.51 u[IU]/mL (ref 0.41–5.90)

## 2014-04-09 ENCOUNTER — Encounter: Payer: Self-pay | Admitting: General Practice

## 2014-04-09 NOTE — Progress Notes (Signed)
Long acquainted with Marden Noble and his wife [name] from our membership at [employer].  Provided pastoral presence, empathic listening, and encouragement to them in lobby.  [name] struggles with stress of caregiving, so spiritual and social support are very meaningful to her.  Will continue to follow for support, but please also page as needs arise.  Thank you.  [place], [place]

## 2014-04-12 ENCOUNTER — Other Ambulatory Visit: Payer: Self-pay | Admitting: Nurse Practitioner

## 2014-04-12 DIAGNOSIS — E1122 Type 2 diabetes mellitus with diabetic chronic kidney disease: Secondary | ICD-10-CM

## 2014-04-30 ENCOUNTER — Telehealth: Payer: Self-pay | Admitting: Hematology

## 2014-04-30 NOTE — Telephone Encounter (Signed)
lvm for pt regardin to r/s appt....advised to call back

## 2014-05-01 ENCOUNTER — Telehealth: Payer: Self-pay | Admitting: Hematology

## 2014-05-01 NOTE — Telephone Encounter (Signed)
returned pt call and confirmed appt....ok and aware °

## 2014-05-05 ENCOUNTER — Ambulatory Visit: Payer: Medicare Other

## 2014-05-05 ENCOUNTER — Ambulatory Visit (HOSPITAL_BASED_OUTPATIENT_CLINIC_OR_DEPARTMENT_OTHER): Payer: Medicare Other | Admitting: Hematology

## 2014-05-05 ENCOUNTER — Other Ambulatory Visit (HOSPITAL_BASED_OUTPATIENT_CLINIC_OR_DEPARTMENT_OTHER): Payer: Medicare Other

## 2014-05-05 ENCOUNTER — Other Ambulatory Visit: Payer: Medicare Other

## 2014-05-05 ENCOUNTER — Ambulatory Visit (HOSPITAL_BASED_OUTPATIENT_CLINIC_OR_DEPARTMENT_OTHER): Payer: Medicare Other

## 2014-05-05 ENCOUNTER — Ambulatory Visit (HOSPITAL_COMMUNITY)
Admission: RE | Admit: 2014-05-05 | Discharge: 2014-05-05 | Disposition: A | Discharge status: Home / Self Care | Payer: Medicare Other | Source: Ambulatory Visit | Attending: Hematology | Admitting: Hematology

## 2014-05-05 ENCOUNTER — Telehealth: Payer: Self-pay | Admitting: Hematology

## 2014-05-05 VITALS — BP 129/61 | HR 76 | Temp 97.9°F | Resp 18 | Ht 68.0 in | Wt 234.2 lb

## 2014-05-05 DIAGNOSIS — N183 Chronic kidney disease, stage 3 (moderate): Secondary | ICD-10-CM

## 2014-05-05 DIAGNOSIS — D649 Anemia, unspecified: Secondary | ICD-10-CM | POA: Insufficient documentation

## 2014-05-05 DIAGNOSIS — N189 Chronic kidney disease, unspecified: Secondary | ICD-10-CM

## 2014-05-05 DIAGNOSIS — N039 Chronic nephritic syndrome with unspecified morphologic changes: Principal | ICD-10-CM

## 2014-05-05 DIAGNOSIS — D696 Thrombocytopenia, unspecified: Secondary | ICD-10-CM

## 2014-05-05 DIAGNOSIS — D631 Anemia in chronic kidney disease: Secondary | ICD-10-CM

## 2014-05-05 LAB — CBC WITH DIFFERENTIAL/PLATELET
BASO%: 0.2 % (ref 0.0–2.0)
BASOS ABS: 0 10*3/uL (ref 0.0–0.1)
EOS%: 2.7 % (ref 0.0–7.0)
Eosinophils Absolute: 0.1 10*3/uL (ref 0.0–0.5)
HCT: 24.2 % — ABNORMAL LOW (ref 38.4–49.9)
HEMOGLOBIN: 7.9 g/dL — AB (ref 13.0–17.1)
LYMPH%: 32.5 % (ref 14.0–49.0)
MCH: 37.8 pg — AB (ref 27.2–33.4)
MCHC: 32.6 g/dL (ref 32.0–36.0)
MCV: 115.8 fL — AB (ref 79.3–98.0)
MONO#: 0.3 10*3/uL (ref 0.1–0.9)
MONO%: 5.9 % (ref 0.0–14.0)
NEUT#: 2.6 10*3/uL (ref 1.5–6.5)
NEUT%: 58.7 % (ref 39.0–75.0)
Platelets: 65 10*3/uL — ABNORMAL LOW (ref 140–400)
RBC: 2.09 10*6/uL — AB (ref 4.20–5.82)
RDW: 14.6 % (ref 11.0–14.6)
WBC: 4.4 10*3/uL (ref 4.0–10.3)
lymph#: 1.4 10*3/uL (ref 0.9–3.3)
nRBC: 0 % (ref 0–0)

## 2014-05-05 LAB — COMPREHENSIVE METABOLIC PANEL (CC13)
ALT: 10 U/L (ref 0–55)
AST: 8 U/L (ref 5–34)
Albumin: 3.1 g/dL — ABNORMAL LOW (ref 3.5–5.0)
Alkaline Phosphatase: 97 U/L (ref 40–150)
Anion Gap: 7 mEq/L (ref 3–11)
BUN: 32.7 mg/dL — ABNORMAL HIGH (ref 7.0–26.0)
CO2: 26 mEq/L (ref 22–29)
Calcium: 9.1 mg/dL (ref 8.4–10.4)
Chloride: 101 mEq/L (ref 98–109)
Creatinine: 1.7 mg/dL — ABNORMAL HIGH (ref 0.7–1.3)
Glucose: 187 mg/dl — ABNORMAL HIGH (ref 70–140)
Potassium: 5 mEq/L (ref 3.5–5.1)
SODIUM: 135 meq/L — AB (ref 136–145)
TOTAL PROTEIN: 6.4 g/dL (ref 6.4–8.3)
Total Bilirubin: 0.26 mg/dL (ref 0.20–1.20)

## 2014-05-05 LAB — CBC AND DIFFERENTIAL
HCT: 24 % — AB (ref 41–53)
HEMOGLOBIN: 7.9 g/dL — AB (ref 13.5–17.5)
Platelets: 65 10*3/uL — AB (ref 150–399)
WBC: 4.4 10*3/mL

## 2014-05-05 LAB — HOLD TUBE, BLOOD BANK

## 2014-05-05 MED ORDER — DARBEPOETIN ALFA-POLYSORBATE 500 MCG/ML IJ SOLN
500.0000 ug | Freq: Once | INTRAMUSCULAR | Status: AC
  Administered 2014-05-05: 500 ug via SUBCUTANEOUS
  Filled 2014-05-05: qty 1

## 2014-05-05 NOTE — Telephone Encounter (Signed)
gv adn pritned appt sched and avs for pt for OCT and NOV...sed added tx...sent pt to lab

## 2014-05-05 NOTE — Telephone Encounter (Signed)
gv adn prnted appt sched and avs fo rpt for OCT and NOV

## 2014-05-06 LAB — PREPARE RBC (CROSSMATCH)

## 2014-05-07 ENCOUNTER — Ambulatory Visit (HOSPITAL_BASED_OUTPATIENT_CLINIC_OR_DEPARTMENT_OTHER): Payer: Medicare Other

## 2014-05-07 ENCOUNTER — Other Ambulatory Visit: Payer: Self-pay | Admitting: Hematology

## 2014-05-07 ENCOUNTER — Encounter: Payer: Self-pay | Admitting: General Practice

## 2014-05-07 VITALS — BP 141/56 | HR 78 | Temp 97.1°F | Resp 18

## 2014-05-07 DIAGNOSIS — D539 Nutritional anemia, unspecified: Secondary | ICD-10-CM

## 2014-05-07 DIAGNOSIS — D649 Anemia, unspecified: Secondary | ICD-10-CM

## 2014-05-07 MED ORDER — SODIUM CHLORIDE 0.9 % IV SOLN
250.0000 mL | Freq: Once | INTRAVENOUS | Status: AC
  Administered 2014-05-07: 250 mL via INTRAVENOUS

## 2014-05-07 MED ORDER — FUROSEMIDE 10 MG/ML IJ SOLN: 20.0000 mg | Freq: Once | INTRAMUSCULAR | Status: DC

## 2014-05-07 NOTE — Progress Notes (Signed)
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  Will continue to follow for support and encouragement.  [place], [place]

## 2014-05-07 NOTE — Patient Instructions (Signed)

## 2014-05-07 NOTE — Progress Notes (Unsigned)
Dr. Lona Kettle aware of pt's BPs and order to hold IV lasix given verbally at this time.

## 2014-05-07 NOTE — Progress Notes (Signed)
Followed up with Marden Noble and his wife [name] in lobby for support and encouragement as they prepared for his 5-hour tx today.  Provided logistical support, waiting outside for [employer] driver to bring pt's blue bracelet (which caregivers had removed this morning, despite his protestation) so that [place] could stay with Marden Noble.  Concluded visit in infusion room after helping [name] make a self-care plan for today.   Family very appreciative of ongoing pastoral care.  [place], [place]

## 2014-05-08 LAB — TYPE AND SCREEN
ABO/RH(D): O POS
Antibody Screen: NEGATIVE
UNIT DIVISION: 0
Unit division: 0

## 2014-05-09 ENCOUNTER — Encounter: Payer: Self-pay | Admitting: Hematology

## 2014-05-09 NOTE — Progress Notes (Signed)
[place] HEMATOLOGY OFFICE PROGRESS NOTE DATE OF VISIT: 05/05/2014  Estill Dooms, MD [address] [place] [address]  DIAGNOSIS: Anemia of chronic kidney disease and possible myelodysplastic syndrome.  CURRENT TREATMENT:  Aranesp 300 mcg every 4 weeks for hemoglobin less than 11 started on 09/23/13; increased to 500 mcg every 4 weeks in June 2015.  INTERVAL HISTORY:  [name] [name] 80 y.o. male who was referred by Dr. Nyoka Cowden for evaluation of chronic anemia and thrombocytopenia and evaluated by Dr Juliann Mule on 08/26/2013. Today, the patient is accompanied by his wife. He has dementia and provides limited history. Of note, he has Chronic kidney disease with a creatinine clearance of 30 ml/min and he resides at SNF at [facility]. He was last seen by me on 04/07/2014.   As previously reported, his recent anemia work-up revealed a Hgb of 8.3 on 09/02/2012; Normal iron 87, B12 740, Folate > 20. He has not been on iron supplementation. His hgb on 08/13/13 revealed a hgb of 8.0. GI (Dr. Norberto Sorenson T. Dagoberto Ligas. MD) was consulted on 08/26/2013 for anemia. Last upper endoscopy on 12/13/2011 revealed mild gastritis with recommendations for a PPI q am long term. The patient's review of systems for symptoms of anemia was negative. He does have memory problems.   His WBC was normal at 4.2 on 09/08/13; Hemoglobin of 8.0; Hematocrit of 23.5; MCV was 113.5; Platelet count of 65. He has hypothyroidism and is compliant with levothyroxine 75 mcg, TSH 1.894 on 06/30/2013. Examining the plt trend revealed his thrombocytopenia started around 09/12/2011 following laparotomy by Dr. Lilyan Punt and repair of perforated ulcer. He has since been on nexium 40 mg bid. Of note he takes namenda for his dementia; lasix 20 mg daily for congestive heart failure; lantus for his diabetes. He is on folic acid daily.   Today, he is accompanied by his wife.  He reports overall doing well.  He has had some improvement  with his symptoms of fatigue since starting his aranesp shots. He also takes Q59 pill daily, folic acid daily, Vitamin D and calcium and fish oil.  Recently he was treated with a course of avelox for his cough and bronchitis which he completed on 03/31/2014. He still have some residual cough. He also gets Tussionex syrup for cough. A Chest Xray 03/12/2014 showed an unchanged pleural thickening versus a small left pleural effusion in left lower lung. The right sided area of patchy pneumonitis or atelectasis has resolved. I reviewed all his clinical notes and medication list from SNF.  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   CBC shows that WBC 4.1, HB 7.9, HCT 24 PLT 65  Both hemoglobin and platelets have worsened.   MEDICAL HISTORY: Past Medical History  Diagnosis Date  . Hypothyroidism   . Hypertension   . CHF (congestive heart failure)   . Diabetes mellitus   . Coronary artery disease   . Restless leg syndrome   . Hyperlipemia   . Renal disease     stage II  . Sleep apnea   . Cellulitis and abscess of leg   . Obesity   . Duodenal ulcer, perforated   . Diverticulosis   . Internal hemorrhoids   . CAD (coronary artery disease)   . Anemia   . Duodenal ulcer   .  Allergy   . Blood transfusion   . Cataract   . Tuberculosis     as a child  . Contact dermatitis and other eczema, due to unspecified cause 10/06/2012  . Senile dementia with depressive features 05/26/2012  . Hyposmolality and/or hyponatremia 04/02/2012  . Diarrhea 03/26/2012  . Hemorrhage of rectum and anus 03/25/2012  . Pneumonia, organism unspecified 03/12/2012  . 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 Folate-deficiency anemia 01/07/2012  . Thrombocytopenia, unspecified 09/26/2011  . Loss of weight 09/11/2011  .  Dysphagia, unspecified(787.20) 06/28/2011  . Chronic kidney disease, stage III (moderate) 03/22/2011  . Cramp of limb 08/10/2010  . Chronic kidney disease, stage II (mild) 03/22/2011  . Personal history of fall 05/05/2009  . Unspecified vitamin D deficiency 02/24/2009  . Blood in stool 11/18/2008  . Dermatophytosis of the body 06/23/2007  . Obesity, unspecified 06/23/2007  . Unspecified tinnitus 06/23/2007  . Edema 06/23/2007  . Shortness of breath 06/23/2007  . Flatulence, eructation, and gas pain 06/23/2007  . Other general symptoms(780.99) 06/23/2007  . Memory loss 08/13/2003    INTERIM HISTORY: has UNSPECIFIED ANEMIA; BLOOD IN STOOL; Duodenal ulcer; CHF (congestive heart failure); CKD (chronic kidney disease); Respiratory failure; Type 2 diabetes mellitus with diabetic chronic kidney disease; Fall at nursing home; Depression; Dementia; Essential hypertension, benign; Unspecified hypothyroidism; Anemia in chronic kidney disease; Unspecified gastritis and gastroduodenitis without mention of hemorrhage; Urinary tract infection, site not specified; Acute bronchitis; Thrombocytopenia, unspecified; Hyperkalemia; Foot pain, left; and Closed nondisplaced fracture of fifth left metatarsal bone on his problem list.    ALLERGIES:  is allergic to enalapril and milk-related compounds.  MEDICATIONS: has a current medication list which includes the following prescription(s): acetaminophen, calcium carbonate-vitamin d, cholecalciferol, darbepoetin alfa-albumin, duloxetine, fluticasone-salmeterol, folic acid, furosemide, guaifenesin, insulin aspart, insulin glargine, levothyroxine, memantine, metoprolol tartrate, fish oil, omeprazole, protein supplement, tiotropium, and vitamin b-12, and the following Facility-Administered Medications: furosemide.  SURGICAL HISTORY:  Past Surgical History  Procedure Laterality Date  . Laparotomy  09/12/2011    Procedure: EXPLORATORY LAPAROTOMY;  Surgeon: Judieth Keens, DO;  Location: [facility];  Service: General;  Laterality: N/A;  REPAIR PERFORATED ULCER  . Gastrostomy  09/12/2011    Procedure: GASTROSTOMY;  Surgeon: Judieth Keens, DO;  Location: [facility];  Service: General;  Laterality: N/A;  BIOPSY DUODENAL ULCER  . Shoulder surgery  [place], [place]  . Jejunostomy feeding tube      removed  . Back surgery  1942  . Eye surgery  [phone]    cataracts  . Coronary artery bypass graft  2201    REVIEW OF SYSTEMS:   Constitutional: Denies fevers, chills or abnormal weight loss Eyes: Denies blurriness of vision Ears, nose, mouth, throat, and face: Denies mucositis or sore throat Respiratory: Denies cough, dyspnea or wheezes Cardiovascular: Denies palpitation, chest discomfort or lower extremity swelling Gastrointestinal:  Denies nausea, heartburn or change in bowel habits Skin: Denies abnormal skin rashes Lymphatics: Denies new lymphadenopathy or easy bruising Neurological:Denies numbness, tingling or new weaknesses; + Memory problems Behavioral/Psych: Mood is stable, no new changes  All other systems were reviewed with the patient and are negative.  PHYSICAL EXAMINATION: ECOG PERFORMANCE STATUS: 2  Blood pressure 129/61, pulse 76, temperature 97.9 F (36.6 C), temperature source Oral, resp. rate 18, height $RemoveBe'5\' 8"'vEkTpzQmk$  (1.727 m), weight 234 lb 3.2 oz (106.232 kg).  GENERAL:alert, no distress and comfortable; obese sitting in wheelchair.  SKIN: skin color, texture, turgor are normal,  no rashes or significant lesions  EYES: normal, Conjunctiva are pink and non-injected, sclera clear  OROPHARYNX:no exudate, no erythema and lips, buccal mucosa, and tongue normal  NECK: supple, thyroid normal size, non-tender, without nodularity  LYMPH: no palpable lymphadenopathy in the cervical, axillary or inguinal  LUNGS: Rales in the right lower field and left lower field. and percussion with normal breathing effort  HEART: regular rate & rhythm and no  murmurs and 1+ pitting edema  ABDOMEN:abdomen soft, non-tender and normal bowel sounds  Musculoskeletal:no cyanosis of digits and no clubbing  NEURO: alert & oriented x 3 with fluent speech, no focal motor/sensory deficits   Labs:   Na 135 K 5.0 Glu 187 Creatinine 1.7 Total bil 0.2 Albumin 3.1  Wbc 4.4, hgb 7.9 mcv 115 platelets 65    RADIOGRAPHIC STUDIES: No results found.  ASSESSMENT: [name] [name] 80 y.o. male with a history of Anemia of chronic disease, renal failure and possibly myelodysplastic syndrome.  PLAN:   1. Anemia of mixed etiology including anemia of chronic kidney disease.  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 He had a normal MCV prior to lap secondary to perforated ulcer. His MCV was normocytic on 09/11/2012 prior to procedure. He is maintained on folic with normal folate and vitamin B-12 levels. In addition, his TSH is normal and he has normal liver function. Other considerations would be MDS, MM or other primary bone marrow bleedings. In addition, he could have some anemia related to his chronic kidney disease with creatinine clearance of 30. His EPO level is low for the degree of his anemia.   --Thus, we started aranesp 300 mcg monthly on 02/25 with a goal hemoglobin greater than 10. His hemoglobin is 9.1 today.  Given his advanced age and co-morbidities, he was less inclined to obtain a bone marrow biopsy at this point.  Based on his initial response, we will continue his aranesp at 500 mcg monthly.   --  I spoke with patient about doing a bone marrow aspirate and biopsy and we will consider that in 1-2 months if anemia becomes more severe and refractory. The advantage of that is if we confirm that he has myelodysplastic syndrome, we can offer him therapies like  Vidaza or Dacogen which are both approved for MDS and the mechanism of action is hypomethylation. If MDS is proven, we also have to risk stratify his chances of developing an acute  leukemia based on the bone marrow morphology, blast count, clonal abnormalities, cytogenetics. It can also help [place] in picking the right agent for him such as Revlimid or vidaza.  -- I am setting up a CT guided bone marrow biopsy in 2 weeks. He will receive 2 units of packed RBCS. Continue Aranesp for now.  2. CKDz. --His creatinine is 1.8-2.0 with creatinine clearance of 30 mL/min.  Avoid nephrotoxins.    3. Thrombocytopenia, chronic.  --He denies any symptoms of bleeding.  Platelet count is 65,000.  4. Follow-up.  --He will follow up with [place] monthly to continue aranesp 500 mcg monthly. He will follow up in clinic in 1 month.   5. Nutritional deficiencies: Patient is on b12, folate and only other possibility is for him to have a vitamin c or zinc deficiency which can also contribute to anemia. I will place him on vitamin C 500 mg daily and zinc 50 mg daily and see how his hemoglobin responds to that.   All questions were answered. The patient knows to call the clinic with  any problems, questions or concerns. We can certainly see the patient much sooner if necessary. He will get a flu shot at [facility].  I spent 30 minutes counseling the patient face to face. The total time spent in the appointment was 30 minutes.    Bernadene Bell, MD Medical Hematologist/Oncologist [place] Pager: [phone] Office No: [phone]

## 2014-05-11 LAB — HEMOGLOBIN A1C: Hgb A1c MFr Bld: 7.1 % — AB (ref 4.0–6.0)

## 2014-05-11 LAB — BASIC METABOLIC PANEL
BUN: 39 mg/dL — AB (ref 4–21)
CREATININE: 1.8 mg/dL — AB (ref 0.6–1.3)
Potassium: 4.4 mmol/L (ref 3.4–5.3)
Sodium: 135 mmol/L — AB (ref 137–147)

## 2014-05-12 ENCOUNTER — Other Ambulatory Visit: Payer: Self-pay | Admitting: Nurse Practitioner

## 2014-05-12 ENCOUNTER — Encounter: Payer: Self-pay | Admitting: Nurse Practitioner

## 2014-05-12 DIAGNOSIS — E1122 Type 2 diabetes mellitus with diabetic chronic kidney disease: Secondary | ICD-10-CM

## 2014-05-12 DIAGNOSIS — N183 Chronic kidney disease, stage 3 (moderate): Secondary | ICD-10-CM

## 2014-05-17 ENCOUNTER — Other Ambulatory Visit: Payer: Self-pay | Admitting: Radiology

## 2014-05-18 ENCOUNTER — Other Ambulatory Visit: Payer: Self-pay | Admitting: Radiology

## 2014-05-19 ENCOUNTER — Ambulatory Visit (HOSPITAL_COMMUNITY): Admission: RE | Admit: 2014-05-19 | Payer: Medicare Other | Source: Ambulatory Visit

## 2014-05-19 ENCOUNTER — Encounter (HOSPITAL_COMMUNITY): Payer: Self-pay

## 2014-05-19 ENCOUNTER — Ambulatory Visit (HOSPITAL_COMMUNITY)
Admission: RE | Admit: 2014-05-19 | Discharge: 2014-05-19 | Disposition: A | Discharge status: Home / Self Care | Payer: Medicare Other | Source: Ambulatory Visit | Attending: Hematology | Admitting: Hematology

## 2014-05-19 DIAGNOSIS — D539 Nutritional anemia, unspecified: Secondary | ICD-10-CM | POA: Insufficient documentation

## 2014-05-19 DIAGNOSIS — D649 Anemia, unspecified: Secondary | ICD-10-CM

## 2014-05-19 DIAGNOSIS — E785 Hyperlipidemia, unspecified: Secondary | ICD-10-CM | POA: Diagnosis not present

## 2014-05-19 DIAGNOSIS — E119 Type 2 diabetes mellitus without complications: Secondary | ICD-10-CM | POA: Insufficient documentation

## 2014-05-19 DIAGNOSIS — R718 Other abnormality of red blood cells: Secondary | ICD-10-CM | POA: Diagnosis not present

## 2014-05-19 DIAGNOSIS — I129 Hypertensive chronic kidney disease with stage 1 through stage 4 chronic kidney disease, or unspecified chronic kidney disease: Secondary | ICD-10-CM | POA: Diagnosis not present

## 2014-05-19 DIAGNOSIS — D696 Thrombocytopenia, unspecified: Secondary | ICD-10-CM | POA: Diagnosis present

## 2014-05-19 DIAGNOSIS — E039 Hypothyroidism, unspecified: Secondary | ICD-10-CM | POA: Diagnosis not present

## 2014-05-19 DIAGNOSIS — Z6835 Body mass index (BMI) 35.0-35.9, adult: Secondary | ICD-10-CM | POA: Diagnosis not present

## 2014-05-19 DIAGNOSIS — Z794 Long term (current) use of insulin: Secondary | ICD-10-CM | POA: Diagnosis not present

## 2014-05-19 DIAGNOSIS — Z79899 Other long term (current) drug therapy: Secondary | ICD-10-CM | POA: Diagnosis not present

## 2014-05-19 DIAGNOSIS — N183 Chronic kidney disease, stage 3 (moderate): Secondary | ICD-10-CM | POA: Diagnosis not present

## 2014-05-19 DIAGNOSIS — F0390 Unspecified dementia without behavioral disturbance: Secondary | ICD-10-CM | POA: Diagnosis not present

## 2014-05-19 DIAGNOSIS — I509 Heart failure, unspecified: Secondary | ICD-10-CM | POA: Diagnosis not present

## 2014-05-19 DIAGNOSIS — I2510 Atherosclerotic heart disease of native coronary artery without angina pectoris: Secondary | ICD-10-CM | POA: Insufficient documentation

## 2014-05-19 LAB — CBC
HCT: 31.2 % — ABNORMAL LOW (ref 39.0–52.0)
HEMOGLOBIN: 10.6 g/dL — AB (ref 13.0–17.0)
MCH: 37.2 pg — ABNORMAL HIGH (ref 26.0–34.0)
MCHC: 34 g/dL (ref 30.0–36.0)
MCV: 109.5 fL — ABNORMAL HIGH (ref 78.0–100.0)
Platelets: 77 10*3/uL — ABNORMAL LOW (ref 150–400)
RBC: 2.85 MIL/uL — AB (ref 4.22–5.81)
RDW: 17.6 % — ABNORMAL HIGH (ref 11.5–15.5)
WBC: 4.8 10*3/uL (ref 4.0–10.5)

## 2014-05-19 LAB — BONE MARROW EXAM

## 2014-05-19 LAB — PROTIME-INR
INR: 1.1 (ref 0.00–1.49)
Prothrombin Time: 14.3 seconds (ref 11.6–15.2)

## 2014-05-19 LAB — APTT: APTT: 28 s (ref 24–37)

## 2014-05-19 LAB — GLUCOSE, CAPILLARY: Glucose-Capillary: 244 mg/dL — ABNORMAL HIGH (ref 70–99)

## 2014-05-19 MED ORDER — HYDROCODONE-ACETAMINOPHEN 5-325 MG PO TABS
1.0000 | ORAL_TABLET | ORAL | Status: DC | PRN
Start: 2014-05-19 — End: 2014-05-20
  Filled 2014-05-19: qty 2

## 2014-05-19 MED ORDER — MIDAZOLAM HCL 2 MG/2ML IJ SOLN
INTRAMUSCULAR | Status: AC
  Filled 2014-05-19: qty 6

## 2014-05-19 MED ORDER — FENTANYL CITRATE 0.05 MG/ML IJ SOLN
INTRAMUSCULAR | Status: AC
  Filled 2014-05-19: qty 6

## 2014-05-19 MED ORDER — SODIUM CHLORIDE 0.9 % IV SOLN
Freq: Once | INTRAVENOUS | Status: AC
  Administered 2014-05-19: 08:00:00 via INTRAVENOUS

## 2014-05-19 MED ORDER — FENTANYL CITRATE 0.05 MG/ML IJ SOLN
INTRAMUSCULAR | Status: AC | PRN
  Administered 2014-05-19: 12.5 ug via INTRAVENOUS

## 2014-05-19 NOTE — Discharge Instructions (Signed)
Conscious Sedation Sedation is the use of medicines to promote relaxation and relieve discomfort and anxiety. Conscious sedation is a type of sedation. Under conscious sedation you are less alert than normal but are still able to respond to instructions or stimulation. Conscious sedation is used during short medical and dental procedures. It is milder than deep sedation or general anesthesia and allows you to return to your regular activities sooner.  LET [facility] CARE PROVIDER KNOW ABOUT:   Any allergies you have.  All medicines you are taking, including vitamins, herbs, eye drops, creams, and over-the-counter medicines.  Use of steroids (by mouth or creams).  Previous problems you or members of your family have had with the use of anesthetics.  Any blood disorders you have.  Previous surgeries you have had.  Medical conditions you have.  Possibility of pregnancy, if this applies.  Use of cigarettes, alcohol, or illegal drugs. RISKS AND COMPLICATIONS Generally, this is a safe procedure. However, as with any procedure, problems can occur. Possible problems include:  Oversedation.  Trouble breathing on your own. You may need to have a breathing tube until you are awake and breathing on your own.  Allergic reaction to any of the medicines used for the procedure. BEFORE THE PROCEDURE  You may have blood tests done. These tests can help show how well your kidneys and liver are working. They can also show how well your blood clots.  A physical exam will be done.  Only take medicines as directed by your health care provider. You may need to stop taking medicines (such as blood thinners, aspirin, or nonsteroidal anti-inflammatory drugs) before the procedure.   Do not eat or drink at least 6 hours before the procedure or as directed by your health care provider.  Arrange for a responsible adult, family member, or friend to take you home after the procedure. He or she should stay  with you for at least 24 hours after the procedure, until the medicine has worn off. PROCEDURE   An intravenous (IV) catheter will be inserted into one of your veins. Medicine will be able to flow directly into your body through this catheter. You may be given medicine through this tube to help prevent pain and help you relax.  The medical or dental procedure will be done. AFTER THE PROCEDURE  You will stay in a recovery area until the medicine has worn off. Your blood pressure and pulse will be checked.   Depending on the procedure you had, you may be allowed to go home when you can tolerate liquids and your pain is under control. Document Released: 04/10/2001 Document Revised: 07/21/2013 Document Reviewed: 03/23/2013 [facility] Patient Information 2015 [place], [place]. This information is not intended to replace advice given to you by your health care provider. Make sure you discuss any questions you have with your health care provider.  Bone Marrow Biopsy, Needle, Care After Read the instructions outlined below and refer to this sheet in the next few weeks. These discharge instructions provide you with general information on caring for yourself after you leave the hospital. Your caregiver may also give you specific instructions. While your treatment has been planned according to the most current medical practices available, unavoidable complications sometimes occur. If you have any problems or questions after discharge, call your caregiver. Finding out the results of your test Not all test results are available during your visit. If your test results are not back during the visit, make an appointment with your caregiver  to find out the results. Do not assume everything is normal if you have not heard from your caregiver or the medical facility. It is important for you to follow up on all of your test results.  SEEK MEDICAL CARE IF:   You have redness, swelling, or increasing pain at the site  of the biopsy.  You have pus coming from the biopsy site.  You have drainage from the biopsy site lasting longer than 1 day.  You notice a bad smell coming from the biopsy site or dressing.  You develop persistent nausea or vomiting. SEEK IMMEDIATE MEDICAL CARE IF:  You have a fever.  You develop a rash.  You have difficulty breathing.  You develop any reaction or side effects to medicines given. Document Released: 02/02/2005 Document Revised: 05/06/2013 Document Reviewed: 12/21/2008 [facility] Patient Information 2015 [place], [place]. This information is not intended to replace advice given to you by your health care provider. Make sure you discuss any questions you have with your health care provider.

## 2014-05-19 NOTE — H&P (Signed)
Chief Complaint: "I'm here for a bone marrow biopsy" Referring Physician:Sehbai HPI: [name] [name] is an 80 y.o. male with ongoing workup for anemia and thrombocytopenia. He is referred for bone marrow biopsy. He feels well this am, has been NPO PMHx, meds, labs reviewed.  Past Medical History:  Past Medical History  Diagnosis Date  . Hypothyroidism   . Hypertension   . CHF (congestive heart failure)   . Diabetes mellitus   . Coronary artery disease   . Restless leg syndrome   . Hyperlipemia   . Renal disease     stage II  . Sleep apnea   . Cellulitis and abscess of leg   . Obesity   . Duodenal ulcer, perforated   . Diverticulosis   . Internal hemorrhoids   . CAD (coronary artery disease)   . Anemia   . Duodenal ulcer   . Allergy   . Blood transfusion   . Cataract   . Tuberculosis     as a child  . Contact dermatitis and other eczema, due to unspecified cause 10/06/2012  . Senile dementia with depressive features 05/26/2012  . Hyposmolality and/or hyponatremia 04/02/2012  . Diarrhea 03/26/2012  . Hemorrhage of rectum and anus 03/25/2012  . Pneumonia, organism unspecified 03/12/2012  . 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 Folate-deficiency anemia 01/07/2012  . Thrombocytopenia, unspecified 09/26/2011  . Loss of weight 09/11/2011  . Dysphagia, unspecified(787.20) 06/28/2011  . Chronic kidney disease, stage III (moderate) 03/22/2011  . Cramp of limb 08/10/2010  . Chronic kidney disease, stage II (mild) 03/22/2011  . Personal history of fall 05/05/2009  . Unspecified vitamin D deficiency 02/24/2009  . Blood in stool 11/18/2008  . Dermatophytosis of the body 06/23/2007  . Obesity, unspecified 06/23/2007  . Unspecified tinnitus 06/23/2007  . Edema 06/23/2007  . Shortness of breath 06/23/2007  . Flatulence, eructation, and gas pain 06/23/2007  . Other general symptoms(780.99) 06/23/2007  . Memory loss 08/13/2003    Past Surgical  History:  Past Surgical History  Procedure Laterality Date  . Laparotomy  09/12/2011    Procedure: EXPLORATORY LAPAROTOMY;  Surgeon: Judieth Keens, DO;  Location: [facility];  Service: General;  Laterality: N/A;  REPAIR PERFORATED ULCER  . Gastrostomy  09/12/2011    Procedure: GASTROSTOMY;  Surgeon: Judieth Keens, DO;  Location: [facility];  Service: General;  Laterality: N/A;  BIOPSY DUODENAL ULCER  . Shoulder surgery  [place], [place]  . Jejunostomy feeding tube      removed  . Back surgery  1942  . Eye surgery  [phone]    cataracts  . Coronary artery bypass graft  2201    Family History:  Family History  Problem Relation Age of Onset  . Stroke Father     Social History:  reports that he has quit smoking. His smoking use included Cigarettes. He smoked 0.00 packs per day. He has never used smokeless tobacco. He reports that he does not drink alcohol or use illicit drugs.  Allergies:  Allergies  Allergen Reactions  . Enalapril Other (See Comments)    cough  . Metformin And Related Diarrhea  . Milk-Related Compounds Other (See Comments)    Doesn't remember but its a mild reaction    Medications:   Medication List    ASK your doctor about these medications       acetaminophen 325 MG tablet  Commonly known as:  TYLENOL  Take 650 mg by mouth  every 4 (four) hours as needed (for pain).     ARANESP (ALBUMIN FREE) 500 MCG/ML injection  Generic drug:  darbepoetin alfa-polysorbate  Inject 500 mcg into the skin every 30 (thirty) days. [facility] inject [facility]     CALCIUM 600+D 600-400 MG-UNIT per tablet  Generic drug:  Calcium Carbonate-Vitamin D  Take 1 tablet by mouth daily.     chlorpheniramine-HYDROcodone 10-8 MG/5ML Lqcr  Commonly known as:  TUSSIONEX  Take 5 mLs by mouth 2 (two) times daily.     cholecalciferol 1000 UNITS tablet  Commonly known as:  VITAMIN D  Take 1,000 Units by mouth daily.     DULoxetine 60 MG capsule  Commonly known as:  CYMBALTA   Take 60 mg by mouth daily.     fluticasone-salmeterol 45-21 MCG/ACT inhaler  Commonly known as:  ADVAIR HFA  Inhale 2 puffs into the lungs every 12 (twelve) hours.     folic acid 1 MG tablet  Commonly known as:  FOLVITE  Take 1 mg by mouth daily.     furosemide 40 MG tablet  Commonly known as:  LASIX  Take 40 mg by mouth daily.     insulin aspart 100 UNIT/ML injection  Commonly known as:  novoLOG  Inject 5 Units into the skin 3 (three) times daily before meals. For CBG>150     insulin glargine 100 UNIT/ML injection  Commonly known as:  LANTUS  Inject 35 Units into the skin at bedtime.     levothyroxine 75 MCG tablet  Commonly known as:  SYNTHROID, LEVOTHROID  Take 75 mcg by mouth daily.     metoprolol tartrate 25 MG tablet  Commonly known as:  LOPRESSOR  Take 25 mg by mouth 2 (two) times daily.     NAMENDA XR 28 MG Cp24  Generic drug:  Memantine HCl ER  Take 28 mg by mouth daily with breakfast.     omeprazole 20 MG capsule  Commonly known as:  PRILOSEC  Take 1 capsule (20 mg total) by mouth daily.     SEA-OMEGA 30 1200 MG Caps  Take 1 capsule by mouth daily.     spironolactone 25 MG tablet  Commonly known as:  ALDACTONE  Take 25 mg by mouth daily.     tiotropium 18 MCG inhalation capsule  Commonly known as:  SPIRIVA  Place 18 mcg into inhaler and inhale daily. 2 puffs daily     vitamin B-12 1000 MCG tablet  Commonly known as:  CYANOCOBALAMIN  Take 1,000 mcg by mouth daily.     vitamin C 500 MG tablet  Commonly known as:  ASCORBIC ACID  Take 500 mg by mouth daily.     Zinc 50 MG Tabs  Take 50 mg by mouth daily.        Please HPI for pertinent positives, otherwise complete 10 system ROS negative.  Physical Exam: BP 112/61  Pulse 90  Temp(Src) 97.5 F (36.4 C) (Oral)  Resp 24  Ht 5\' 8"  (1.727 m)  Wt 234 lb 3 oz (106.227 kg)  BMI 35.62 kg/m2  SpO2 98% Body mass index is 35.62 kg/(m^2).   General Appearance:  Alert, cooperative, no distress,  appears stated age  Head:  Normocephalic, without obvious abnormality, atraumatic  ENT: Unremarkable  Lungs:   Clear to auscultation bilaterally, no w/r/r, respirations unlabored without use of accessory muscles.  Chest Wall:  No tenderness or deformity  Heart:  Regular rate and rhythm, S1, S2 normal, no murmur, rub or  gallop.  Neurologic: Normal affect, no gross deficits.  Labs: Results for orders placed during the hospital encounter of 05/19/14 (from the past 48 hour(s))  APTT     Status: None   Collection Time    05/19/14  7:25 AM      Result Value Ref Range   aPTT 28  24 - 37 seconds  CBC     Status: Abnormal   Collection Time    05/19/14  7:25 AM      Result Value Ref Range   WBC 4.8  4.0 - 10.5 K/uL   RBC 2.85 (*) 4.22 - 5.81 MIL/uL   Hemoglobin 10.6 (*) 13.0 - 17.0 g/dL   HCT 31.2 (*) 39.0 - 52.0 %   MCV 109.5 (*) 78.0 - 100.0 fL   MCH 37.2 (*) 26.0 - 34.0 pg   MCHC 34.0  30.0 - 36.0 g/dL   RDW 17.6 (*) 11.5 - 15.5 %   Platelets 77 (*) 150 - 400 K/uL   Comment: SPECIMEN CHECKED FOR CLOTS     REPEATED TO VERIFY     PLATELET COUNT CONFIRMED BY SMEAR  PROTIME-INR     Status: None   Collection Time    05/19/14  7:25 AM      Result Value Ref Range   Prothrombin Time 14.3  11.6 - 15.2 seconds   INR 1.10  0.00 - 1.49    Imaging: No results found.  Assessment/Plan Anemia and thrombocytopenia For CT guided bone marrow biopsy. Explained procedure, risks, complications, sue of sedation. Labs reviewed. Consent signed in chart   Ascencion Dike PA-C 05/19/2014, 8:46 AM

## 2014-05-19 NOTE — H&P (Signed)
Agree with PA note.  Discussed procedure with patient and will proceed with CT guided bone marrow biopsy.

## 2014-05-19 NOTE — Procedures (Signed)
CT guided bone marrow biopsy.  Two aspirates and one core.  No immediate complication.

## 2014-05-21 ENCOUNTER — Other Ambulatory Visit: Payer: Self-pay

## 2014-05-21 MED ORDER — HYDROCOD POLST-CHLORPHEN POLST 10-8 MG/5ML PO LQCR: 5.0000 mL | Freq: Two times a day (BID) | ORAL | Status: DC

## 2014-05-21 NOTE — Telephone Encounter (Signed)
RX sent to [facility] pharmacy @ [phone], phone number [phone]. This is a nursing home refill request.   

## 2014-05-24 ENCOUNTER — Non-Acute Institutional Stay (SKILLED_NURSING_FACILITY): Payer: Medicare Other | Admitting: Nurse Practitioner

## 2014-05-24 ENCOUNTER — Encounter: Payer: Self-pay | Admitting: Nurse Practitioner

## 2014-05-24 DIAGNOSIS — F0390 Unspecified dementia without behavioral disturbance: Secondary | ICD-10-CM

## 2014-05-24 DIAGNOSIS — D631 Anemia in chronic kidney disease: Secondary | ICD-10-CM

## 2014-05-24 DIAGNOSIS — I5020 Unspecified systolic (congestive) heart failure: Secondary | ICD-10-CM

## 2014-05-24 DIAGNOSIS — E039 Hypothyroidism, unspecified: Secondary | ICD-10-CM

## 2014-05-24 DIAGNOSIS — S92355S Nondisplaced fracture of fifth metatarsal bone, left foot, sequela: Secondary | ICD-10-CM

## 2014-05-24 DIAGNOSIS — E1122 Type 2 diabetes mellitus with diabetic chronic kidney disease: Secondary | ICD-10-CM

## 2014-05-24 DIAGNOSIS — I10 Essential (primary) hypertension: Secondary | ICD-10-CM

## 2014-05-24 DIAGNOSIS — F329 Major depressive disorder, single episode, unspecified: Secondary | ICD-10-CM

## 2014-05-24 DIAGNOSIS — N183 Chronic kidney disease, stage 3 (moderate): Secondary | ICD-10-CM

## 2014-05-24 DIAGNOSIS — K269 Duodenal ulcer, unspecified as acute or chronic, without hemorrhage or perforation: Secondary | ICD-10-CM

## 2014-05-24 DIAGNOSIS — F32A Depression, unspecified: Secondary | ICD-10-CM

## 2014-05-24 DIAGNOSIS — N189 Chronic kidney disease, unspecified: Secondary | ICD-10-CM

## 2014-05-24 NOTE — Assessment & Plan Note (Signed)
Improved. 05/24/14 change Tussionex to prn.

## 2014-05-24 NOTE — Progress Notes (Signed)
Patient ID: [name] [name], male   DOB: 03/02/1920, 80 y.o.   MRN: [id number]   Code Status: DNR  Allergies  Allergen Reactions  . Enalapril Other (See Comments)    cough  . Metformin And Related Diarrhea  . Milk-Related Compounds Other (See Comments)    Doesn't remember but its a mild reaction    Chief Complaint  Patient presents with  . Medical Management of Chronic Issues  . Acute Visit    blood sugar, cough    HPI: Patient is a 80 y.o. male seen in the SNF at [facility] today for evaluation of blood sugar and chronic medical conditions.  Problem List Items Addressed This Visit   Type 2 diabetes mellitus with diabetic chronic kidney disease - Primary     Hgb A1c 7.6 12/01/13 04/08/14 Hgb A1c 7.7, Bun/creat 35/1.47 05/11/14 Hgb 7.1, Bun/creat 39/1.82 05/24/14 CBG am 200s, noon 200s, pm 300s-increase Lantus to 40 units sq daily. Continue Novolog 5 units with meals for CBG>150     Hypothyroidism     Corrected with Levothyroxine 62mcg, TSH 2.55 12/01/13 and 2.83 01/12/14.    Essential hypertension, benign     Controlled on Metoprolol $RemoveBefor'25mg'fhDnLWJeXWUu$  bid. off Losartan due to CKD. Creatinine 1.5-2.0.    Duodenal ulcer     Stable, takes Omeprazole $RemoveBeforeDE'20mg'IyKkcOiETPYpEcG$  daily, no c/o stomach pain, indigestion, or melena.        Depression     Stabilized since Cymbalta was increased to $RemoveBefo'60mg'QgRnDvXqjoR$  01/11/14. 01/11/14 wife was tearful and stated [name] [name] yelled at her. She said he has been getting angry easily, throwing objects to staff, and verbally threatening staff.       Dementia     Stable on Namenda. Continue SNF for care needs.      Closed nondisplaced fracture of fifth left metatarsal bone     03/22/14 X-ray the left fifth metatarsal base fracture-complete or near complete, but nondisplaced transverse to oblique fracture through the fifth metatarsal base.  05/24/14 clinically healed.     CKD (chronic kidney disease)     01/12/14 Bun/creat 46/1.86 05/11/14 Bun/creat 39/1.82     CHF  (congestive heart failure)     compensated on Furosemide $RemoveBefor'20mg'grdNdokSZpoZ$ , trace BLE edema-chronic. CXR 06/29/13 showed no pulmonary vascular congestion.        Anemia in chronic kidney disease     Darbepoetin Alfa inj [facility]-last 01/13/14 02/10/14 Hgb 9.1 03/10/14 Hgb 8.6 05/05/14 Hematology: anemia of chronic kidney disease and possible myelodysplastic syndrome. Megablastic, hypoliferative anemia. Hgb 9.1. Aranesp 551mcg monthly. 05/19/14 CT guided bone marrow biopsy and 2 units of packed RBCS. Appointment 06/02/14         Review of Systems:  Review of Systems  Constitutional: Negative for fever, chills, weight loss, malaise/fatigue and diaphoresis.  HENT: Positive for hearing loss. Negative for congestion, ear pain and sore throat.   Eyes: Negative for pain, discharge and redness.  Respiratory: Positive for cough and sputum production. Negative for shortness of breath and wheezing.        Clear sputum. Associated with swallowing. Better with chronic cough.  Cardiovascular: Positive for leg swelling (trace). Negative for chest pain, palpitations, orthopnea, claudication and PND.  Gastrointestinal: Negative for heartburn, abdominal pain, diarrhea, constipation and blood in stool.  Genitourinary: Positive for frequency. Negative for dysuria, urgency, hematuria and flank pain.  Musculoskeletal: Positive for back pain and joint pain. Negative for myalgias and neck pain.       New left lateral foot pain-small  bruise anterior left lateral malleolus-healed  Skin: Negative for itching and rash.       Small open area right buttock.   Neurological: Negative for dizziness, tingling, tremors, sensory change, speech change, focal weakness, seizures, loss of consciousness, weakness and headaches.  Endo/Heme/Allergies: Negative for environmental allergies and polydipsia. Does not bruise/bleed easily.  Psychiatric/Behavioral: Positive for depression and memory loss. Negative for hallucinations. The  patient is nervous/anxious. The patient does not have insomnia.        Angry, throwing objects at staff, verbally threatening to others.      Past Medical History  Diagnosis Date  . Hypothyroidism   . Hypertension   . CHF (congestive heart failure)   . Diabetes mellitus   . Coronary artery disease   . Restless leg syndrome   . Hyperlipemia   . Renal disease     stage II  . Sleep apnea   . Cellulitis and abscess of leg   . Obesity   . Duodenal ulcer, perforated   . Diverticulosis   . Internal hemorrhoids   . CAD (coronary artery disease)   . Anemia   . Duodenal ulcer   . Allergy   . Blood transfusion   . Cataract   . Tuberculosis     as a child  . Contact dermatitis and other eczema, due to unspecified cause 10/06/2012  . Senile dementia with depressive features 05/26/2012  . Hyposmolality and/or hyponatremia 04/02/2012  . Diarrhea 03/26/2012  . Hemorrhage of rectum and anus 03/25/2012  . Pneumonia, organism unspecified 03/12/2012  . 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 Folate-deficiency anemia 01/07/2012  . Thrombocytopenia, unspecified 09/26/2011  . Loss of weight 09/11/2011  . Dysphagia, unspecified(787.20) 06/28/2011  . Chronic kidney disease, stage III (moderate) 03/22/2011  . Cramp of limb 08/10/2010  . Chronic kidney disease, stage II (mild) 03/22/2011  . Personal history of fall 05/05/2009  . Unspecified vitamin D deficiency 02/24/2009  . Blood in stool 11/18/2008  . Dermatophytosis of the body 06/23/2007  . Obesity, unspecified 06/23/2007  . Unspecified tinnitus 06/23/2007  . Edema 06/23/2007  . Shortness of breath 06/23/2007  . Flatulence, eructation, and gas pain 06/23/2007  . Other general symptoms(780.99) 06/23/2007  . Memory loss 08/13/2003   Past Surgical History  Procedure Laterality Date  . Laparotomy  09/12/2011    Procedure: EXPLORATORY LAPAROTOMY;  Surgeon: Judieth Keens, DO;  Location: [facility];  Service:  General;  Laterality: N/A;  REPAIR PERFORATED ULCER  . Gastrostomy  09/12/2011    Procedure: GASTROSTOMY;  Surgeon: Judieth Keens, DO;  Location: [facility];  Service: General;  Laterality: N/A;  BIOPSY DUODENAL ULCER  . Shoulder surgery  [place], [place]  . Jejunostomy feeding tube      removed  . Back surgery  1942  . Eye surgery  [phone]    cataracts  . Coronary artery bypass graft  2201   Social History:   reports that he has quit smoking. His smoking use included Cigarettes. He smoked 0.00 packs per day. He has never used smokeless tobacco. He reports that he does not drink alcohol or use illicit drugs.  Family History  Problem Relation Age of Onset  . Stroke Father     Medications: Patient's Medications  New Prescriptions   No medications on file  Previous Medications   ACETAMINOPHEN (TYLENOL) 325 MG TABLET    Take 650 mg by mouth every 4 (four) hours as needed (for pain).  CALCIUM CARBONATE-VITAMIN D (CALCIUM 600+D) 600-400 MG-UNIT PER TABLET    Take 1 tablet by mouth daily.   CHLORPHENIRAMINE-HYDROCODONE (TUSSIONEX) 10-8 MG/5ML LQCR    Take 5 mLs by mouth 2 (two) times daily.   CHOLECALCIFEROL (VITAMIN D) 1000 UNITS TABLET    Take 1,000 Units by mouth daily.    DARBEPOETIN ALFA-POLYSORBATE (ARANESP, ALBUMIN FREE,) 500 MCG/ML INJECTION    Inject 500 mcg into the skin every 30 (thirty) days. Gets inject at [facility]   DULOXETINE (CYMBALTA) 60 MG CAPSULE    Take 60 mg by mouth daily.   FLUTICASONE-SALMETEROL (ADVAIR HFA) 45-21 MCG/ACT INHALER    Inhale 2 puffs into the lungs every 12 (twelve) hours.    FOLIC ACID (FOLVITE) 1 MG TABLET    Take 1 mg by mouth daily.    FUROSEMIDE (LASIX) 40 MG TABLET    Take 40 mg by mouth daily.   INSULIN ASPART (NOVOLOG) 100 UNIT/ML INJECTION    Inject 5 Units into the skin 3 (three) times daily before meals. For CBG>150   INSULIN GLARGINE (LANTUS) 100 UNIT/ML INJECTION    Inject 35 Units into the skin at bedtime.    LEVOTHYROXINE  (SYNTHROID, LEVOTHROID) 75 MCG TABLET    Take 75 mcg by mouth daily.    MEMANTINE HCL ER (NAMENDA XR) 28 MG CP24    Take 28 mg by mouth daily with breakfast.   METOPROLOL TARTRATE (LOPRESSOR) 25 MG TABLET    Take 25 mg by mouth 2 (two) times daily.   OMEGA-3 FATTY ACIDS (SEA-OMEGA 30) 1200 MG CAPS    Take 1 capsule by mouth daily.   OMEPRAZOLE (PRILOSEC) 20 MG CAPSULE    Take 1 capsule (20 mg total) by mouth daily.   SPIRONOLACTONE (ALDACTONE) 25 MG TABLET    Take 25 mg by mouth daily.   TIOTROPIUM (SPIRIVA) 18 MCG INHALATION CAPSULE    Place 18 mcg into inhaler and inhale daily. 2 puffs daily   VITAMIN B-12 (CYANOCOBALAMIN) 1000 MCG TABLET    Take 1,000 mcg by mouth daily.   VITAMIN C (ASCORBIC ACID) 500 MG TABLET    Take 500 mg by mouth daily.   ZINC 50 MG TABS    Take 50 mg by mouth daily.  Modified Medications   No medications on file  Discontinued Medications   No medications on file     Physical Exam: Physical Exam  Constitutional: He is oriented to person, place, and time. He appears well-developed and well-nourished. No distress.  HENT:  Head: Normocephalic and atraumatic.  Right Ear: External ear normal.  Left Ear: External ear normal.  Nose: Nose normal.  Mouth/Throat: Oropharynx is clear and moist. No oropharyngeal exudate.  Eyes: Conjunctivae and EOM are normal. Pupils are equal, round, and reactive to light.  Neck: Normal range of motion. Neck supple. No JVD present. No thyromegaly present.  Cardiovascular: Normal rate, regular rhythm and normal heart sounds.   No murmur heard. Pulmonary/Chest: Effort normal. No respiratory distress. He has no wheezes. He has rales in the right lower field and the left lower field. He exhibits no tenderness.  Bibasilar as prior.   Abdominal: Soft. Bowel sounds are normal. He exhibits no distension and no mass. There is no tenderness. There is no rebound.  Musculoskeletal: Normal range of motion. He exhibits edema (trace in BLE) and  tenderness.  New left lateral foot pain-small bruise anterior left lateral malleolus-healed.   Lymphadenopathy:    He has no cervical adenopathy.  Neurological: He is  alert and oriented to person, place, and time. He has normal reflexes. No cranial nerve deficit. Coordination normal.  Skin: Skin is warm and dry. No rash noted. He is not diaphoretic. No erythema.  Small open area right buttock.  Psychiatric: Thought content normal. His mood appears anxious. His affect is angry and inappropriate. His affect is not blunt and not labile. His speech is not delayed, not tangential and not slurred. He is agitated and aggressive. He is not hyperactive, not slowed, not withdrawn, not actively hallucinating and not combative. Cognition and memory are impaired. He exhibits a depressed mood. He is communicative. He exhibits abnormal recent memory. He is attentive.    Filed Vitals:   05/24/14 1510  BP: 110/57  Pulse: 79  Temp: 98.6 F (37 C)  TempSrc: Tympanic  Resp: 19   Labs reviewed: Basic Metabolic Panel:  Recent Labs  12/01/13 01/12/14 02/10/14 1001 04/07/14 1002 04/08/14 05/05/14 1227 05/11/14  NA  --  135* 135* 133* 134* 135* 135*  K  --  4.5 5.3* 5.3* 4.5 5.0 4.4  CO2  --   --  24 24  --  26  --   GLUCOSE  --   --  146* 185*  --  187*  --   BUN  --  46* 41.4* 33.7* 35* 32.7* 39*  CREATININE  --  1.9* 1.8* 1.6* 1.5* 1.7* 1.8*  CALCIUM  --   --  9.1 8.8  --  9.1  --   TSH 2.55 2.83  --   --  2.51  --   --    Liver Function Tests:  Recent Labs  06/30/13 09/10/13 1249 01/12/14 04/08/14 05/05/14 1227  AST 9* 11 9* 11* 8  ALT 8* 10 8* 8* 10  ALKPHOS 66 84 66 66 97  BILITOT  --  0.28  --   --  0.26  PROT  --  6.6  --   --  6.4  ALBUMIN  --  3.3*  --   --  3.1*   CBC:  Recent Labs  03/10/14 1004  04/07/14 1002 05/05/14 05/05/14 1227 05/19/14 0725  WBC 4.6  < > 4.1 4.4 4.4 4.8  NEUTROABS 3.0  --  2.8  --  2.6  --   HGB 8.6*  < > 8.1* 7.9* 7.9* 10.6*  HCT 26.0*  < > 24.6*  24* 24.2* 31.2*  MCV 114.0*  --  115.0*  --  115.8* 109.5*  PLT 71*  < > 72* 65* 65* 77*  < > = values in this interval not displayed. Past Procedures:  CXR 06/29/13 showed there is mild patchy atelectasis or interstitial pneumonitis at the right lung base appearing significantly improved.   Assessment/Plan Type 2 diabetes mellitus with diabetic chronic kidney disease Hgb A1c 7.6 12/01/13 04/08/14 Hgb A1c 7.7, Bun/creat 35/1.47 05/11/14 Hgb 7.1, Bun/creat 39/1.82 05/24/14 CBG am 200s, noon 200s, pm 300s-increase Lantus to 40 units sq daily. Continue Novolog 5 units with meals for CBG>150   Acute bronchitis Improved. 05/24/14 change Tussionex to prn.   Anemia in chronic kidney disease Darbepoetin Alfa inj [facility]-last 01/13/14 02/10/14 Hgb 9.1 03/10/14 Hgb 8.6 05/05/14 Hematology: anemia of chronic kidney disease and possible myelodysplastic syndrome. Megablastic, hypoliferative anemia. Hgb 9.1. Aranesp 564mg monthly. 05/19/14 CT guided bone marrow biopsy and 2 units of packed RBCS. Appointment 06/02/14    CHF (congestive heart failure) compensated on Furosemide 286m trace BLE edema-chronic. CXR 06/29/13 showed no pulmonary vascular congestion.  CKD (chronic kidney disease) 01/12/14 Bun/creat 46/1.86 05/11/14 Bun/creat 39/1.82   Closed nondisplaced fracture of fifth left metatarsal bone 03/22/14 X-ray the left fifth metatarsal base fracture-complete or near complete, but nondisplaced transverse to oblique fracture through the fifth metatarsal base.  05/24/14 clinically healed.   Dementia Stable on Namenda. Continue SNF for care needs.    Depression Stabilized since Cymbalta was increased to 47m 01/11/14. 01/11/14 wife was tearful and stated Mr. PRorkeyelled at her. She said he has been getting angry easily, throwing objects to staff, and verbally threatening staff.     Duodenal ulcer Stable, takes Omeprazole 29mdaily, no c/o stomach pain, indigestion, or  melena.      Essential hypertension, benign Controlled on Metoprolol 2564mid. off Losartan due to CKD. Creatinine 1.5-2.0.  Hypothyroidism Corrected with Levothyroxine 84m71mTSH 2.55 12/01/13 and 2.83 01/12/14.  Anemia Hgb 8.5 01/12/14. Monthly Darbepoetin inj 04/07/14 Oncology: add Vit C Zinc f/u 05/05/14 Aronesp-plan: Hgb better-same plan. Worse Aronesp q 3wk and bone marrow biopsy result pending. Hgb 8.1      Family/ Staff Communication: observe the patient  Goals of Care: SNF  Labs/tests ordered: pending bone marrow biopsy result.

## 2014-05-24 NOTE — Assessment & Plan Note (Signed)
Stabilized since Cymbalta was increased to 60mg  01/11/14. 01/11/14 wife was tearful and stated [name] [name] yelled at her. She said he has been getting angry easily, throwing objects to staff, and verbally threatening staff.

## 2014-05-24 NOTE — Assessment & Plan Note (Signed)
Stable, takes Omeprazole 20mg daily, no c/o stomach pain, indigestion, or melena.    

## 2014-05-24 NOTE — Assessment & Plan Note (Signed)
Controlled on Metoprolol 25mg bid. off Losartan due to CKD. Creatinine 1.5-2.0.  

## 2014-05-24 NOTE — Assessment & Plan Note (Signed)
Stable on Namenda. Continue SNF for care needs.

## 2014-05-24 NOTE — Assessment & Plan Note (Signed)
03/22/14 X-ray the left fifth metatarsal base fracture-complete or near complete, but nondisplaced transverse to oblique fracture through the fifth metatarsal base.  05/24/14 clinically healed.

## 2014-05-24 NOTE — Assessment & Plan Note (Signed)
01/12/14 Bun/creat 46/1.86 05/11/14 Bun/creat 39/1.82 

## 2014-05-24 NOTE — Assessment & Plan Note (Signed)
Darbepoetin Alfa inj [facility]-last 01/13/14 02/10/14 Hgb 9.1 03/10/14 Hgb 8.6 05/05/14 Hematology: anemia of chronic kidney disease and possible myelodysplastic syndrome. Megablastic, hypoliferative anemia. Hgb 9.1. Aranesp 500mcg monthly. 05/19/14 CT guided bone marrow biopsy and 2 units of packed RBCS. Appointment 06/02/14   

## 2014-05-24 NOTE — Assessment & Plan Note (Signed)
Hgb 8.5 01/12/14. Monthly Darbepoetin inj 04/07/14 Oncology: add Vit C Zinc f/u 05/05/14 Aronesp-plan: Hgb better-same plan. Worse Aronesp q 3wk and bone marrow biopsy result pending. Hgb 8.1

## 2014-05-24 NOTE — Assessment & Plan Note (Signed)
compensated on Furosemide 20mg , trace BLE edema-chronic. CXR 06/29/13 showed no pulmonary vascular congestion.

## 2014-05-24 NOTE — Assessment & Plan Note (Addendum)
Hgb A1c 7.6 12/01/13 04/08/14 Hgb A1c 7.7, Bun/creat 35/1.47 05/11/14 Hgb 7.1, Bun/creat 39/1.82 05/24/14 CBG am 200s, noon 200s, pm 300s-increase Lantus to 40 units sq daily. Continue Novolog 5 units with meals for CBG>150

## 2014-05-24 NOTE — Assessment & Plan Note (Signed)
Corrected with Levothyroxine 37mcg, TSH 2.55 12/01/13 and 2.83 01/12/14.

## 2014-05-27 LAB — CHROMOSOME ANALYSIS, BONE MARROW

## 2014-05-27 LAB — TISSUE HYBRIDIZATION (BONE MARROW)-NCBH

## 2014-06-01 ENCOUNTER — Encounter (HOSPITAL_COMMUNITY): Payer: Self-pay

## 2014-06-02 ENCOUNTER — Telehealth: Payer: Self-pay | Admitting: Hematology

## 2014-06-02 ENCOUNTER — Ambulatory Visit: Payer: Medicare Other

## 2014-06-02 ENCOUNTER — Other Ambulatory Visit (HOSPITAL_BASED_OUTPATIENT_CLINIC_OR_DEPARTMENT_OTHER): Payer: Medicare Other

## 2014-06-02 ENCOUNTER — Encounter: Payer: Self-pay | Admitting: Hematology

## 2014-06-02 ENCOUNTER — Ambulatory Visit (HOSPITAL_BASED_OUTPATIENT_CLINIC_OR_DEPARTMENT_OTHER): Payer: Medicare Other | Admitting: Hematology

## 2014-06-02 VITALS — BP 128/53 | HR 93 | Temp 98.0°F | Resp 18 | Ht 68.0 in | Wt 228.2 lb

## 2014-06-02 DIAGNOSIS — D696 Thrombocytopenia, unspecified: Secondary | ICD-10-CM

## 2014-06-02 DIAGNOSIS — D649 Anemia, unspecified: Secondary | ICD-10-CM

## 2014-06-02 DIAGNOSIS — N189 Chronic kidney disease, unspecified: Secondary | ICD-10-CM

## 2014-06-02 DIAGNOSIS — D6489 Other specified anemias: Secondary | ICD-10-CM

## 2014-06-02 DIAGNOSIS — D631 Anemia in chronic kidney disease: Secondary | ICD-10-CM

## 2014-06-02 DIAGNOSIS — D508 Other iron deficiency anemias: Secondary | ICD-10-CM

## 2014-06-02 DIAGNOSIS — D4620 Refractory anemia with excess of blasts, unspecified: Secondary | ICD-10-CM

## 2014-06-02 LAB — CBC WITH DIFFERENTIAL/PLATELET
BASO%: 0.2 % (ref 0.0–2.0)
BASOS ABS: 0 10*3/uL (ref 0.0–0.1)
EOS%: 1.3 % (ref 0.0–7.0)
Eosinophils Absolute: 0.1 10*3/uL (ref 0.0–0.5)
HEMATOCRIT: 27.5 % — AB (ref 38.4–49.9)
HEMOGLOBIN: 9.4 g/dL — AB (ref 13.0–17.1)
LYMPH#: 1.6 10*3/uL (ref 0.9–3.3)
LYMPH%: 28.7 % (ref 14.0–49.0)
MCH: 37.2 pg — AB (ref 27.2–33.4)
MCHC: 34.2 g/dL (ref 32.0–36.0)
MCV: 108.7 fL — ABNORMAL HIGH (ref 79.3–98.0)
MONO#: 0.5 10*3/uL (ref 0.1–0.9)
MONO%: 8.1 % (ref 0.0–14.0)
NEUT#: 3.4 10*3/uL (ref 1.5–6.5)
NEUT%: 61.7 % (ref 39.0–75.0)
Platelets: 77 10*3/uL — ABNORMAL LOW (ref 140–400)
RBC: 2.53 10*6/uL — ABNORMAL LOW (ref 4.20–5.82)
RDW: 17.7 % — ABNORMAL HIGH (ref 11.0–14.6)
WBC: 5.6 10*3/uL (ref 4.0–10.3)
nRBC: 0 % (ref 0–0)

## 2014-06-02 LAB — BASIC METABOLIC PANEL (CC13)
ANION GAP: 8 meq/L (ref 3–11)
BUN: 38.9 mg/dL — AB (ref 7.0–26.0)
CALCIUM: 9.2 mg/dL (ref 8.4–10.4)
CHLORIDE: 99 meq/L (ref 98–109)
CO2: 25 mEq/L (ref 22–29)
CREATININE: 1.7 mg/dL — AB (ref 0.7–1.3)
Glucose: 137 mg/dl (ref 70–140)
Potassium: 5.5 mEq/L — ABNORMAL HIGH (ref 3.5–5.1)
Sodium: 133 mEq/L — ABNORMAL LOW (ref 136–145)

## 2014-06-02 LAB — CBC AND DIFFERENTIAL
HCT: 28 % — AB (ref 41–53)
HEMOGLOBIN: 9.4 g/dL — AB (ref 13.5–17.5)
Platelets: 77 10*3/uL — AB (ref 150–399)
WBC: 5.6 10^3/mL

## 2014-06-02 LAB — HOLD TUBE, BLOOD BANK

## 2014-06-02 MED ORDER — DARBEPOETIN ALFA 500 MCG/ML IJ SOSY
500.0000 ug | PREFILLED_SYRINGE | Freq: Once | INTRAMUSCULAR | Status: AC
  Administered 2014-06-02: 500 ug via SUBCUTANEOUS
  Filled 2014-06-02: qty 1

## 2014-06-02 NOTE — Progress Notes (Signed)
[facility]AT[facility] OFFICE PROGRESS NOTE DATE OF VISIT: 06/02/2014  [name], [name] [name], MD [address] [place] [address]  DIAGNOSIS: Anemia of chronic kidney disease and possible myelodysplastic syndrome.  CURRENT TREATMENT:    1.Aranesp 300 mcg every 4 weeks for hemoglobin less than 11 started on 09/23/13; increased to 500 mcg every 4 weeks in June 2015.  2. Review results of the bone marrow biopsy and aspirate done 05/19/2014 consistent with MDS. INTERVAL HISTORY:  [name] [name] 80 y.o. male who was referred by Dr. Nyoka Cowden for evaluation of chronic anemia and thrombocytopenia and evaluated by Dr Juliann Mule on 08/26/2013. Today, the patient is accompanied by his wife. He has dementia and provides limited history. Of note, he has Chronic kidney disease with a creatinine clearance of 30 ml/min and he resides at SNF at [facility]. He was last seen by me on 04/07/2014.   As previously reported, his recent anemia work-up revealed a Hgb of 8.3 on 09/02/2012; Normal iron 87, B12 740, Folate > 20. He has not been on iron supplementation. His hgb on 08/13/13 revealed a hgb of 8.0. GI (Dr. Norberto Sorenson T. Dagoberto Ligas. MD) was consulted on 08/26/2013 for anemia. Last upper endoscopy on 12/13/2011 revealed mild gastritis with recommendations for a PPI q am long term. The patient's review of systems for symptoms of anemia was negative. He does have memory problems.   His WBC was normal at 4.2 on 09/08/13; Hemoglobin of 8.0; Hematocrit of 23.5; MCV was 113.5; Platelet count of 65. He has hypothyroidism and is compliant with levothyroxine 75 mcg, TSH 1.894 on 06/30/2013. Examining the plt trend revealed his thrombocytopenia started around 09/12/2011 following laparotomy by Dr. Lilyan Punt and repair of perforated ulcer. He has since been on nexium 40 mg bid. Of note he takes namenda for his dementia; lasix 20 mg daily for congestive heart failure; lantus for his diabetes. He is on folic acid daily.    Today, he is accompanied by his wife.  He reports overall doing well.  He has had some improvement with his symptoms of fatigue since starting his aranesp shots. He also takes K34 pill daily, folic acid daily, Vitamin D and calcium and fish oil.  Recently he was treated with a course of avelox for his cough and bronchitis which he completed on 03/31/2014. He still have some residual cough. He also gets Tussionex syrup for cough. A Chest Xray 03/12/2014 showed an unchanged pleural thickening versus a small left pleural effusion in left lower lung. The right sided area of patchy pneumonitis or atelectasis has resolved. I reviewed all his clinical notes and medication list from SNF.  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   PATIENT DID HAVE A BONE MARROW ASPIRATE AND BIOPSY DONE ON 05/19/2014.        FLOW CYTOMETRY WAS NEGATIVE FOR ANY BLASTIC POPULATION, CYTOGENETICS WERE NORMAL.  MEDICAL HISTORY: Past Medical History  Diagnosis Date  . Hypothyroidism   . Hypertension   . CHF (congestive heart failure)   . Diabetes mellitus   . Coronary artery disease   . Restless leg syndrome   . Hyperlipemia   . Renal disease     stage II  . Sleep apnea   . Cellulitis and abscess of leg   . Obesity   .  Duodenal ulcer, perforated   . Diverticulosis   . Internal hemorrhoids   . CAD (coronary artery disease)   . Anemia   . Duodenal ulcer   . Allergy   . Blood transfusion   . Cataract   . Tuberculosis     as a child  . Contact dermatitis and other eczema, due to unspecified cause 10/06/2012  . Senile dementia with depressive features 05/26/2012  . Hyposmolality and/or hyponatremia 04/02/2012  . Diarrhea 03/26/2012  . Hemorrhage of rectum and anus 03/25/2012  . Pneumonia, organism unspecified 03/12/2012  .  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 Folate-deficiency anemia 01/07/2012  . Thrombocytopenia, unspecified 09/26/2011  . Loss of weight 09/11/2011  . Dysphagia, unspecified(787.20) 06/28/2011  . Chronic kidney disease, stage III (moderate) 03/22/2011  . Cramp of limb 08/10/2010  . Chronic kidney disease, stage II (mild) 03/22/2011  . Personal history of fall 05/05/2009  . Unspecified vitamin D deficiency 02/24/2009  . Blood in stool 11/18/2008  . Dermatophytosis of the body 06/23/2007  . Obesity, unspecified 06/23/2007  . Unspecified tinnitus 06/23/2007  . Edema 06/23/2007  . Shortness of breath 06/23/2007  . Flatulence, eructation, and gas pain 06/23/2007  . Other general symptoms(780.99) 06/23/2007  . Memory loss 08/13/2003    INTERIM HISTORY: has Anemia; BLOOD IN STOOL; Duodenal ulcer; CHF (congestive heart failure); CKD (chronic kidney disease); Respiratory failure; Type 2 diabetes mellitus with diabetic chronic kidney disease; Fall at nursing home; Depression; Dementia; Essential hypertension, benign; Hypothyroidism; Anemia in chronic kidney disease; Unspecified gastritis and gastroduodenitis without mention of hemorrhage; Urinary tract infection, site not specified; Acute bronchitis; Thrombocytopenia, unspecified; Hyperkalemia; Foot pain, left; and Closed nondisplaced fracture of fifth left metatarsal bone on his problem list.    ALLERGIES:  is allergic to enalapril; metformin and related; and milk-related compounds.  MEDICATIONS: has a current medication list which includes the following prescription(s): acetaminophen, calcium carbonate-vitamin d, cholecalciferol, darbepoetin alfa-polysorbate, duloxetine, fluticasone-salmeterol, folic acid, furosemide, insulin aspart, levothyroxine, memantine hcl er, metoprolol tartrate, sea-omega 30, omeprazole, spironolactone, tiotropium, vitamin b-12, vitamin c, and zinc, and the following Facility-Administered  Medications: furosemide.  SURGICAL HISTORY:  Past Surgical History  Procedure Laterality Date  . Laparotomy  09/12/2011    Procedure: EXPLORATORY LAPAROTOMY;  Surgeon: Judieth Keens, DO;  Location: [facility];  Service: General;  Laterality: N/A;  REPAIR PERFORATED ULCER  . Gastrostomy  09/12/2011    Procedure: GASTROSTOMY;  Surgeon: Judieth Keens, DO;  Location: [facility];  Service: General;  Laterality: N/A;  BIOPSY DUODENAL ULCER  . Shoulder surgery  [place], [place]  . Jejunostomy feeding tube      removed  . Back surgery  1942  . Eye surgery  [phone]    cataracts  . Coronary artery bypass graft  2201    REVIEW OF SYSTEMS:   Constitutional: Denies fevers, chills or abnormal weight loss Eyes: Denies blurriness of vision Ears, nose, mouth, throat, and face: Denies mucositis or sore throat Respiratory: Denies cough, dyspnea or wheezes Cardiovascular: Denies palpitation, chest discomfort or lower extremity swelling Gastrointestinal:  Denies nausea, heartburn or change in bowel habits Skin: Denies abnormal skin rashes Lymphatics: Denies new lymphadenopathy or easy bruising Neurological:Denies numbness, tingling or new weaknesses; + Memory problems Behavioral/Psych: Mood is stable, no new changes  All other systems were reviewed with the patient and are negative.  PHYSICAL EXAMINATION: ECOG PERFORMANCE STATUS: 2  Blood pressure 128/53, pulse 93, temperature 98 F (36.7 C), temperature source Oral,  resp. rate 18, height _0  (1.727 m), weight 228 lb 3.2 oz (103.511 kg).  GENERAL:alert, no distress and comfortable; obese sitting in wheelchair.  SKIN: skin color, texture, turgor are normal, no rashes or significant lesions  EYES: normal, Conjunctiva are pink and non-injected, sclera clear  OROPHARYNX:no exudate, no erythema and lips, buccal mucosa, and tongue normal  NECK: supple, thyroid normal size, non-tender, without nodularity  LYMPH: no palpable lymphadenopathy  in the cervical, axillary or inguinal  LUNGS: Rales in the right lower field and left lower field. and percussion with normal breathing effort  HEART: regular rate & rhythm and no murmurs and 1+ pitting edema  ABDOMEN:abdomen soft, non-tender and normal bowel sounds  Musculoskeletal:no cyanosis of digits and no clubbing  NEURO: alert & oriented x 3 with fluent speech, no focal motor/sensory deficits   Labs:       RADIOGRAPHIC STUDIES: No results found.  ASSESSMENT: [name] [name] 80 y.o. male with a history of Anemia of chronic disease, renal failure and possibly myelodysplastic syndrome.  PLAN:   1. Anemia of mixed etiology including anemia of chronic kidney disease.  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 He had a normal MCV prior to lap secondary to perforated ulcer. His MCV was normocytic on 09/11/2012 prior to procedure. He is maintained on folic with normal folate and vitamin B-12 levels. In addition, his TSH is normal and he has normal liver function. Other considerations would be MDS, MM or other primary bone marrow bleedings. In addition, he could have some anemia related to his chronic kidney disease with creatinine clearance of 30. His EPO level is low for the degree of his anemia.   --Thus, we started aranesp 300 mcg monthly on 02/25 with a goal hemoglobin greater than 10. His hemoglobin is 9.1 today.  Given his advanced age and co-morbidities, he was less inclined to obtain a bone marrow biopsy at this point.  Based on his initial response, we will continue his aranesp at 500 mcg monthly.   --  PATIENT'S BONE MARROW ASPIRATE AND BIOPSY RESULTS WERE DISCUSSED IN DETAIL. 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 That is likely playing a major role in his anemia. I explained to him that we have therapies such as Dacogen or Vidaza which are called hypomethylating drugs  and they can improve the blood counts and delay the progression of MDS to leukemia. Usually they're well tolerated. She was given information on the drugs and myelodysplastic syndrome and patient and his wife would like to discuss it with other family members. Once we initiate the therapy patient will get 5 days of Vidaza and thereafter will need labs checked every week plus minus blood transfusion. Usually for the first 3-4 months the cytopenias get worse before we see an improvement. They will make a decision in about 2 weeks when I am bringing them for follow-up and repeating a CBC. Today he is only getting and Aranesp but no blood transfusion.   2. CKDz. --His creatinine is 1.7 with creatinine clearance of 33 mL/min.  Avoid nephrotoxins.    3. Thrombocytopenia, chronic.  --He denies any symptoms of bleeding.  Platelet count is 77,000.  4. Follow-up.  --He will follow up with [place] in 2 weeks and will continue aranesp 500 mcg monthly.    5. Nutritional deficiencies: Patient is on b12, folate and only other possibility is for him to have a vitamin c or  zinc deficiency which can also contribute to anemia. I will place him on vitamin C 500 mg daily and zinc 50 mg daily and see how his hemoglobin responds to that.   All questions were answered. The patient knows to call the clinic with any problems, questions or concerns. We can certainly see the patient much sooner if necessary. He will get a flu shot at [facility].  I spent 20 minutes counseling the patient face to face. The total time spent in the appointment was 30 minutes.    Bernadene Bell, MD Medical Hematologist/Oncologist [place] Pager: [phone] Office No: [phone]

## 2014-06-02 NOTE — Telephone Encounter (Signed)
Per 11/04 POF added injection for today and also for next cycle .Marland Kitchen... KJ

## 2014-06-02 NOTE — Patient Instructions (Signed)
Darbepoetin Alfa injection What is this medicine? DARBEPOETIN ALFA (dar be POE e tin AL fa) helps your body make more red blood cells. It is used to treat anemia caused by chronic kidney failure and chemotherapy. This medicine may be used for other purposes; ask your health care provider or pharmacist if you have questions. COMMON BRAND NAME(S): Aranesp What should I tell my health care provider before I take this medicine? They need to know if you have any of these conditions: -blood clotting disorders or history of blood clots -cancer patient not on chemotherapy -cystic fibrosis -heart disease, such as angina, heart failure, or a history of a heart attack -hemoglobin level of 12 g/dL or greater -high blood pressure -low levels of folate, iron, or vitamin B12 -seizures -an unusual or allergic reaction to darbepoetin, erythropoietin, albumin, hamster proteins, latex, other medicines, foods, dyes, or preservatives -pregnant or trying to get pregnant -breast-feeding How should I use this medicine? This medicine is for injection into a vein or under the skin. It is usually given by a health care professional in a hospital or clinic setting. If you get this medicine at home, you will be taught how to prepare and give this medicine. Do not shake the solution before you withdraw a dose. Use exactly as directed. Take your medicine at regular intervals. Do not take your medicine more often than directed. It is important that you put your used needles and syringes in a special sharps container. Do not put them in a trash can. If you do not have a sharps container, call your pharmacist or healthcare provider to get one. Talk to your pediatrician regarding the use of this medicine in children. While this medicine may be used in children as young as 1 year for selected conditions, precautions do apply. Overdosage: If you think you have taken too much of this medicine contact a poison control center or  emergency room at once. NOTE: This medicine is only for you. Do not share this medicine with others. What if I miss a dose? If you miss a dose, take it as soon as you can. If it is almost time for your next dose, take only that dose. Do not take double or extra doses. What may interact with this medicine? Do not take this medicine with any of the following medications: -epoetin alfa This list may not describe all possible interactions. Give your health care provider a list of all the medicines, herbs, non-prescription drugs, or dietary supplements you use. Also tell them if you smoke, drink alcohol, or use illegal drugs. Some items may interact with your medicine. What should I watch for while using this medicine? Visit your prescriber or health care professional for regular checks on your progress and for the needed blood tests and blood pressure measurements. It is especially important for the doctor to make sure your hemoglobin level is in the desired range, to limit the risk of potential side effects and to give you the best benefit. Keep all appointments for any recommended tests. Check your blood pressure as directed. Ask your doctor what your blood pressure should be and when you should contact him or her. As your body makes more red blood cells, you may need to take iron, folic acid, or vitamin B supplements. Ask your doctor or health care provider which products are right for you. If you have kidney disease continue dietary restrictions, even though this medication can make you feel better. Talk with your doctor or health   care professional about the foods you eat and the vitamins that you take. What side effects may I notice from receiving this medicine? Side effects that you should report to your doctor or health care professional as soon as possible: -allergic reactions like skin rash, itching or hives, swelling of the face, lips, or tongue -breathing problems -changes in vision -chest  pain -confusion, trouble speaking or understanding -feeling faint or lightheaded, falls -high blood pressure -muscle aches or pains -pain, swelling, warmth in the leg -rapid weight gain -severe headaches -sudden numbness or weakness of the face, arm or leg -trouble walking, dizziness, loss of balance or coordination -seizures (convulsions) -swelling of the ankles, feet, hands -unusually weak or tired Side effects that usually do not require medical attention (report to your doctor or health care professional if they continue or are bothersome): -diarrhea -fever, chills (flu-like symptoms) -headaches -nausea, vomiting -redness, stinging, or swelling at site where injected This list may not describe all possible side effects. Call your doctor for medical advice about side effects. You may report side effects to FDA at 1-800-FDA-1088. Where should I keep my medicine? Keep out of the reach of children. Store in a refrigerator between 2 and 8 degrees C (36 and 46 degrees F). Do not freeze. Do not shake. Throw away any unused portion if using a single-dose vial. Throw away any unused medicine after the expiration date. NOTE: This sheet is a summary. It may not cover all possible information. If you have questions about this medicine, talk to your doctor, pharmacist, or health care provider.  2015, [employer]. (2008-06-29 10:23:57)  

## 2014-06-02 NOTE — Telephone Encounter (Signed)
Gave avs & cal for Nov.  °

## 2014-06-03 ENCOUNTER — Telehealth: Payer: Self-pay | Admitting: *Deleted

## 2014-06-03 NOTE — Telephone Encounter (Signed)
Per POF and staff message I have tried to scheduled appts. Per staff message from MD patient not to be scheduled until next office visit.

## 2014-06-07 ENCOUNTER — Encounter: Payer: Self-pay | Admitting: Nurse Practitioner

## 2014-06-07 ENCOUNTER — Non-Acute Institutional Stay (SKILLED_NURSING_FACILITY): Payer: Medicare Other | Admitting: Nurse Practitioner

## 2014-06-07 DIAGNOSIS — D631 Anemia in chronic kidney disease: Secondary | ICD-10-CM

## 2014-06-07 DIAGNOSIS — E039 Hypothyroidism, unspecified: Secondary | ICD-10-CM

## 2014-06-07 DIAGNOSIS — I10 Essential (primary) hypertension: Secondary | ICD-10-CM

## 2014-06-07 DIAGNOSIS — F329 Major depressive disorder, single episode, unspecified: Secondary | ICD-10-CM

## 2014-06-07 DIAGNOSIS — F32A Depression, unspecified: Secondary | ICD-10-CM

## 2014-06-07 DIAGNOSIS — I509 Heart failure, unspecified: Secondary | ICD-10-CM

## 2014-06-07 DIAGNOSIS — E1122 Type 2 diabetes mellitus with diabetic chronic kidney disease: Secondary | ICD-10-CM

## 2014-06-07 DIAGNOSIS — N183 Chronic kidney disease, stage 3 (moderate): Secondary | ICD-10-CM

## 2014-06-07 DIAGNOSIS — F0390 Unspecified dementia without behavioral disturbance: Secondary | ICD-10-CM

## 2014-06-07 DIAGNOSIS — N189 Chronic kidney disease, unspecified: Secondary | ICD-10-CM

## 2014-06-07 DIAGNOSIS — D469 Myelodysplastic syndrome, unspecified: Secondary | ICD-10-CM

## 2014-06-07 NOTE — Assessment & Plan Note (Signed)
Controlled on Metoprolol 25mg bid. off Losartan due to CKD. Creatinine 1.5-2.0.  

## 2014-06-07 NOTE — Assessment & Plan Note (Signed)
Hgb A1c 7.6 12/01/13 04/08/14 Hgb A1c 7.7, Bun/creat 35/1.47 05/11/14 Hgb 7.1, Bun/creat 39/1.82 05/24/14 CBG am 200s, noon 200s, pm 300s-increase Lantus to 40 units sq daily. Continue Novolog 5 units with meals for CBG>150 06/07/14 CBG am in 200s-will increase Lantus to 44 units qs and continue Novolog with meals.

## 2014-06-07 NOTE — Assessment & Plan Note (Signed)
Stable on Namenda. Continue SNF for care needs.

## 2014-06-07 NOTE — Progress Notes (Signed)
Patient ID: [name] [name], male   DOB: [birth year], 80 y.o.   MRN: [id number]   Code Status: DNR  Allergies  Allergen Reactions  . Enalapril Other (See Comments)    cough  . Metformin And Related Diarrhea  . Milk-Related Compounds Other (See Comments)    Doesn't remember but its a mild reaction    Chief Complaint  Patient presents with  . Medical Management of Chronic Issues  . Acute Visit    blood sugar    HPI: Patient is a 80 y.o. male seen in the SNF at [facility] today for evaluation of blood sugar and chronic medical conditions.  Problem List Items Addressed This Visit    Type 2 diabetes mellitus with diabetic chronic kidney disease    Hgb A1c 7.6 12/01/13 04/08/14 Hgb A1c 7.7, Bun/creat 35/1.47 05/11/14 Hgb 7.1, Bun/creat 39/1.82 05/24/14 CBG am 200s, noon 200s, pm 300s-increase Lantus to 40 units sq daily. Continue Novolog 5 units with meals for CBG>150 06/07/14 CBG am in 200s-will increase Lantus to 44 units qs and continue Novolog with meals.       MDS (myelodysplastic syndrome)   Hypothyroidism    Corrected with Levothyroxine 6mcg, TSH 2.55 12/01/13 and 2.83 01/12/14.     Essential hypertension, benign    Controlled on Metoprolol $RemoveBefor'25mg'YZwHkICBjAyF$  bid. off Losartan due to CKD. Creatinine 1.5-2.0.     Depression    Stabilized since Cymbalta was increased to $RemoveBefo'60mg'kTVIIsHTXAj$  01/11/14. 01/11/14 wife was tearful and stated Mr. Feagans yelled at her. She said he has been getting angry easily, throwing objects to staff, and verbally threatening staff.       Dementia    Stable on Namenda. Continue SNF for care needs.       CKD (chronic kidney disease)    01/12/14 Bun/creat 46/1.86 05/11/14 Bun/creat 39/1.82      CHF (congestive heart failure)    compensated on Furosemide $RemoveBefor'20mg'HqQJqLLaKVGQ$ , trace BLE edema-chronic. CXR 06/29/13 showed no pulmonary vascular congestion.         Anemia in chronic kidney disease - Primary    Darbepoetin Alfa inj [facility]-last 01/13/14 02/10/14 Hgb  9.1 03/10/14 Hgb 8.6 05/05/14 Hematology: anemia of chronic kidney disease and possible myelodysplastic syndrome. Megablastic, hypoliferative anemia. Hgb 9.1. Aranesp 547mcg monthly. 05/19/14 CT guided bone marrow biopsy and 2 units of packed RBCS.  06/02/14 Hgb 9.4. MDS        Review of Systems:  Review of Systems  Constitutional: Negative for fever, chills, weight loss, malaise/fatigue and diaphoresis.  HENT: Positive for hearing loss. Negative for congestion, ear pain and sore throat.   Eyes: Negative for pain, discharge and redness.  Respiratory: Positive for cough and sputum production. Negative for shortness of breath and wheezing.        Clear sputum. Associated with swallowing. Better with chronic cough.  Cardiovascular: Positive for leg swelling (trace). Negative for chest pain, palpitations, orthopnea, claudication and PND.  Gastrointestinal: Negative for heartburn, abdominal pain, diarrhea, constipation and blood in stool.  Genitourinary: Positive for frequency. Negative for dysuria, urgency, hematuria and flank pain.  Musculoskeletal: Positive for back pain, joint pain and falls. Negative for myalgias and neck pain.       New left lateral foot pain-small bruise anterior left lateral malleolus-healed 06/02/14 fell when trying to get in recliner-no observed injury  Skin: Negative for itching and rash.       Small open area right buttock.   Neurological: Negative for dizziness, tingling, tremors, sensory change, speech  change, focal weakness, seizures, loss of consciousness, weakness and headaches.  Endo/Heme/Allergies: Negative for environmental allergies and polydipsia. Does not bruise/bleed easily.  Psychiatric/Behavioral: Positive for depression and memory loss. Negative for hallucinations. The patient is nervous/anxious. The patient does not have insomnia.        Angry, throwing objects at staff, verbally threatening to others.      Past Medical History  Diagnosis Date  .  Hypothyroidism   . Hypertension   . CHF (congestive heart failure)   . Diabetes mellitus   . Coronary artery disease   . Restless leg syndrome   . Hyperlipemia   . Renal disease     stage II  . Sleep apnea   . Cellulitis and abscess of leg   . Obesity   . Duodenal ulcer, perforated   . Diverticulosis   . Internal hemorrhoids   . CAD (coronary artery disease)   . Anemia   . Duodenal ulcer   . Allergy   . Blood transfusion   . Cataract   . Tuberculosis     as a child  . Contact dermatitis and other eczema, due to unspecified cause 10/06/2012  . Senile dementia with depressive features 05/26/2012  . Hyposmolality and/or hyponatremia 04/02/2012  . Diarrhea 03/26/2012  . Hemorrhage of rectum and anus 03/25/2012  . Pneumonia, organism unspecified 03/12/2012  . 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 Folate-deficiency anemia 01/07/2012  . Thrombocytopenia, unspecified 09/26/2011  . Loss of weight 09/11/2011  . Dysphagia, unspecified(787.20) 06/28/2011  . Chronic kidney disease, stage III (moderate) 03/22/2011  . Cramp of limb 08/10/2010  . Chronic kidney disease, stage II (mild) 03/22/2011  . Personal history of fall 05/05/2009  . Unspecified vitamin D deficiency 02/24/2009  . Blood in stool 11/18/2008  . Dermatophytosis of the body 06/23/2007  . Obesity, unspecified 06/23/2007  . Unspecified tinnitus 06/23/2007  . Edema 06/23/2007  . Shortness of breath 06/23/2007  . Flatulence, eructation, and gas pain 06/23/2007  . Other general symptoms(780.99) 06/23/2007  . Memory loss 08/13/2003   Past Surgical History  Procedure Laterality Date  . Laparotomy  09/12/2011    Procedure: EXPLORATORY LAPAROTOMY;  Surgeon: Judieth Keens, DO;  Location: [facility];  Service: General;  Laterality: N/A;  REPAIR PERFORATED ULCER  . Gastrostomy  09/12/2011    Procedure: GASTROSTOMY;  Surgeon: Judieth Keens, DO;  Location: [facility];  Service: General;  Laterality:  N/A;  BIOPSY DUODENAL ULCER  . Shoulder surgery  [place], [place]  . Jejunostomy feeding tube      removed  . Back surgery  1942  . Eye surgery  [phone]    cataracts  . Coronary artery bypass graft  2201   Social History:   reports that he has quit smoking. His smoking use included Cigarettes. He smoked 0.00 packs per day. He has never used smokeless tobacco. He reports that he does not drink alcohol or use illicit drugs.  Family History  Problem Relation Age of Onset  . Stroke Father     Medications: Patient's Medications  New Prescriptions   No medications on file  Previous Medications   ACETAMINOPHEN (TYLENOL) 325 MG TABLET    Take 650 mg by mouth every 4 (four) hours as needed (for pain).    CALCIUM CARBONATE-VITAMIN D (CALCIUM 600+D) 600-400 MG-UNIT PER TABLET    Take 1 tablet by mouth daily.   CHOLECALCIFEROL (VITAMIN D) 1000 UNITS TABLET    Take 1,000  Units by mouth daily.    DARBEPOETIN ALFA-POLYSORBATE (ARANESP, ALBUMIN FREE,) 500 MCG/ML INJECTION    Inject 500 mcg into the skin every 30 (thirty) days. Gets inject at [facility]   DULOXETINE (CYMBALTA) 60 MG CAPSULE    Take 60 mg by mouth daily.   FLUTICASONE-SALMETEROL (ADVAIR HFA) 45-21 MCG/ACT INHALER    Inhale 2 puffs into the lungs every 12 (twelve) hours.    FOLIC ACID (FOLVITE) 1 MG TABLET    Take 1 mg by mouth daily.    FUROSEMIDE (LASIX) 40 MG TABLET    Take 40 mg by mouth daily.   INSULIN ASPART (NOVOLOG) 100 UNIT/ML INJECTION    Inject 5 Units into the skin 3 (three) times daily before meals. For CBG>150   LEVOTHYROXINE (SYNTHROID, LEVOTHROID) 75 MCG TABLET    Take 75 mcg by mouth daily.    MEMANTINE HCL ER (NAMENDA XR) 28 MG CP24    Take 28 mg by mouth daily with breakfast.   METOPROLOL TARTRATE (LOPRESSOR) 25 MG TABLET    Take 25 mg by mouth 2 (two) times daily.   OMEGA-3 FATTY ACIDS (SEA-OMEGA 30) 1200 MG CAPS    Take 1 capsule by mouth daily.   OMEPRAZOLE (PRILOSEC) 20 MG CAPSULE    Take 1 capsule (20 mg  total) by mouth daily.   SPIRONOLACTONE (ALDACTONE) 25 MG TABLET    Take 25 mg by mouth daily.   TIOTROPIUM (SPIRIVA) 18 MCG INHALATION CAPSULE    Place 18 mcg into inhaler and inhale daily. 2 puffs daily   VITAMIN B-12 (CYANOCOBALAMIN) 1000 MCG TABLET    Take 1,000 mcg by mouth daily.   VITAMIN C (ASCORBIC ACID) 500 MG TABLET    Take 500 mg by mouth daily.   ZINC 50 MG TABS    Take 50 mg by mouth daily.  Modified Medications   No medications on file  Discontinued Medications   No medications on file     Physical Exam: Physical Exam  Constitutional: He is oriented to person, place, and time. He appears well-developed and well-nourished. No distress.  HENT:  Head: Normocephalic and atraumatic.  Right Ear: External ear normal.  Left Ear: External ear normal.  Nose: Nose normal.  Mouth/Throat: Oropharynx is clear and moist. No oropharyngeal exudate.  Eyes: Conjunctivae and EOM are normal. Pupils are equal, round, and reactive to light.  Neck: Normal range of motion. Neck supple. No JVD present. No thyromegaly present.  Cardiovascular: Normal rate, regular rhythm and normal heart sounds.   No murmur heard. Pulmonary/Chest: Effort normal. No respiratory distress. He has no wheezes. He has rales in the right lower field and the left lower field. He exhibits no tenderness.  Bibasilar as prior.   Abdominal: Soft. Bowel sounds are normal. He exhibits no distension and no mass. There is no tenderness. There is no rebound.  Musculoskeletal: Normal range of motion. He exhibits edema (trace in BLE) and tenderness.  New left lateral foot pain-small bruise anterior left lateral malleolus-healed.   Lymphadenopathy:    He has no cervical adenopathy.  Neurological: He is alert and oriented to person, place, and time. He has normal reflexes. No cranial nerve deficit. Coordination normal.  Skin: Skin is warm and dry. No rash noted. He is not diaphoretic. No erythema.  Small open area right buttock.    Psychiatric: Thought content normal. His mood appears anxious. His affect is angry and inappropriate. His affect is not blunt and not labile. His speech is not delayed, not  tangential and not slurred. He is agitated and aggressive. He is not hyperactive, not slowed, not withdrawn, not actively hallucinating and not combative. Cognition and memory are impaired. He exhibits a depressed mood. He is communicative. He exhibits abnormal recent memory. He is attentive.    Filed Vitals:   06/07/14 1134  BP: 106/56  Pulse: 88  Temp: 98.2 F (36.8 C)  TempSrc: Tympanic  Resp: 18   Labs reviewed: Basic Metabolic Panel:  Recent Labs  12/01/13 01/12/14  04/07/14 1002 04/08/14 05/05/14 1227 05/11/14 06/02/14 1042  NA  --  135*  < > 133* 134* 135* 135* 133*  K  --  4.5  < > 5.3* 4.5 5.0 4.4 5.5*  CO2  --   --   < > 24  --  26  --  25  GLUCOSE  --   --   < > 185*  --  187*  --  137  BUN  --  46*  < > 33.7* 35* 32.7* 39* 38.9*  CREATININE  --  1.9*  < > 1.6* 1.5* 1.7* 1.8* 1.7*  CALCIUM  --   --   < > 8.8  --  9.1  --  9.2  TSH 2.55 2.83  --   --  2.51  --   --   --   < > = values in this interval not displayed. Liver Function Tests:  Recent Labs  09/10/13 1249 01/12/14 04/08/14 05/05/14 1227  AST 11 9* 11* 8  ALT 10 8* 8* 10  ALKPHOS 84 66 66 97  BILITOT 0.28  --   --  0.26  PROT 6.6  --   --  6.4  ALBUMIN 3.3*  --   --  3.1*   CBC:  Recent Labs  04/07/14 1002  05/05/14 1227 05/19/14 0725 06/02/14 06/02/14 1042  WBC 4.1  < > 4.4 4.8 5.6 5.6  NEUTROABS 2.8  --  2.6  --   --  3.4  HGB 8.1*  < > 7.9* 10.6* 9.4* 9.4*  HCT 24.6*  < > 24.2* 31.2* 28* 27.5*  MCV 115.0*  --  115.8* 109.5*  --  108.7*  PLT 72*  < > 65* 77* 77* 77*  < > = values in this interval not displayed. Past Procedures:  CXR 06/29/13 showed there is mild patchy atelectasis or interstitial pneumonitis at the right lung base appearing significantly improved.   Assessment/Plan Anemia in chronic kidney  disease Darbepoetin Alfa inj [facility]-last 01/13/14 02/10/14 Hgb 9.1 03/10/14 Hgb 8.6 05/05/14 Hematology: anemia of chronic kidney disease and possible myelodysplastic syndrome. Megablastic, hypoliferative anemia. Hgb 9.1. Aranesp 567mcg monthly. 05/19/14 CT guided bone marrow biopsy and 2 units of packed RBCS.  06/02/14 Hgb 9.4. MDS   Type 2 diabetes mellitus with diabetic chronic kidney disease Hgb A1c 7.6 12/01/13 04/08/14 Hgb A1c 7.7, Bun/creat 35/1.47 05/11/14 Hgb 7.1, Bun/creat 39/1.82 05/24/14 CBG am 200s, noon 200s, pm 300s-increase Lantus to 40 units sq daily. Continue Novolog 5 units with meals for CBG>150 06/07/14 CBG am in 200s-will increase Lantus to 44 units qs and continue Novolog with meals.     Hypothyroidism Corrected with Levothyroxine 54mcg, TSH 2.55 12/01/13 and 2.83 01/12/14.   Essential hypertension, benign Controlled on Metoprolol $RemoveBefor'25mg'kfUVmdOZMiaA$  bid. off Losartan due to CKD. Creatinine 1.5-2.0.   Depression Stabilized since Cymbalta was increased to $RemoveBefo'60mg'UxUGtAQNRqP$  01/11/14. 01/11/14 wife was tearful and stated Mr. Whidbee yelled at her. She said he has been getting angry easily, throwing  objects to staff, and verbally threatening staff.     Dementia Stable on Namenda. Continue SNF for care needs.     CKD (chronic kidney disease) 01/12/14 Bun/creat 46/1.86 05/11/14 Bun/creat 39/1.82    CHF (congestive heart failure) compensated on Furosemide $RemoveBefor'20mg'vIvvJSroGeIC$ , trace BLE edema-chronic. CXR 06/29/13 showed no pulmonary vascular congestion.         Family/ Staff Communication: observe the patient  Goals of Care: SNF  Labs/tests ordered: none

## 2014-06-07 NOTE — Assessment & Plan Note (Signed)
compensated on Furosemide 20mg , trace BLE edema-chronic. CXR 06/29/13 showed no pulmonary vascular congestion.

## 2014-06-07 NOTE — Assessment & Plan Note (Signed)
Corrected with Levothyroxine 62mcg, TSH 2.55 12/01/13 and 2.83 01/12/14.

## 2014-06-07 NOTE — Assessment & Plan Note (Signed)
01/12/14 Bun/creat 46/1.86 05/11/14 Bun/creat 39/1.82 

## 2014-06-07 NOTE — Assessment & Plan Note (Signed)
Darbepoetin Alfa inj [facility]-last 01/13/14 02/10/14 Hgb 9.1 03/10/14 Hgb 8.6 05/05/14 Hematology: anemia of chronic kidney disease and possible myelodysplastic syndrome. Megablastic, hypoliferative anemia. Hgb 9.1. Aranesp 55mg monthly. 05/19/14 CT guided bone marrow biopsy and 2 units of packed RBCS.  06/02/14 Hgb 9.4. MDS

## 2014-06-07 NOTE — Assessment & Plan Note (Signed)
Stabilized since Cymbalta was increased to 60mg  01/11/14. 01/11/14 wife was tearful and stated [name] [name] yelled at her. She said he has been getting angry easily, throwing objects to staff, and verbally threatening staff.

## 2014-06-16 ENCOUNTER — Telehealth: Payer: Self-pay | Admitting: Physician Assistant

## 2014-06-16 ENCOUNTER — Ambulatory Visit: Payer: Medicare Other

## 2014-06-16 ENCOUNTER — Other Ambulatory Visit (HOSPITAL_BASED_OUTPATIENT_CLINIC_OR_DEPARTMENT_OTHER): Payer: Medicare Other

## 2014-06-16 ENCOUNTER — Ambulatory Visit (HOSPITAL_BASED_OUTPATIENT_CLINIC_OR_DEPARTMENT_OTHER): Payer: Medicare Other | Admitting: Physician Assistant

## 2014-06-16 ENCOUNTER — Encounter: Payer: Self-pay | Admitting: Physician Assistant

## 2014-06-16 VITALS — BP 112/55 | HR 80 | Temp 98.3°F | Resp 19 | Ht 68.0 in | Wt 226.2 lb

## 2014-06-16 DIAGNOSIS — D638 Anemia in other chronic diseases classified elsewhere: Secondary | ICD-10-CM

## 2014-06-16 DIAGNOSIS — D469 Myelodysplastic syndrome, unspecified: Secondary | ICD-10-CM

## 2014-06-16 DIAGNOSIS — D4620 Refractory anemia with excess of blasts, unspecified: Secondary | ICD-10-CM

## 2014-06-16 DIAGNOSIS — N189 Chronic kidney disease, unspecified: Secondary | ICD-10-CM

## 2014-06-16 DIAGNOSIS — D631 Anemia in chronic kidney disease: Secondary | ICD-10-CM

## 2014-06-16 LAB — CBC WITH DIFFERENTIAL/PLATELET
BASO%: 0.2 % (ref 0.0–2.0)
Basophils Absolute: 0 10*3/uL (ref 0.0–0.1)
EOS%: 3.4 % (ref 0.0–7.0)
Eosinophils Absolute: 0.2 10*3/uL (ref 0.0–0.5)
HEMATOCRIT: 26 % — AB (ref 38.4–49.9)
HGB: 8.7 g/dL — ABNORMAL LOW (ref 13.0–17.1)
LYMPH%: 29 % (ref 14.0–49.0)
MCH: 37.7 pg — ABNORMAL HIGH (ref 27.2–33.4)
MCHC: 33.5 g/dL (ref 32.0–36.0)
MCV: 112.6 fL — AB (ref 79.3–98.0)
MONO#: 0.3 10*3/uL (ref 0.1–0.9)
MONO%: 6.5 % (ref 0.0–14.0)
NEUT#: 2.9 10*3/uL (ref 1.5–6.5)
NEUT%: 60.9 % (ref 39.0–75.0)
PLATELETS: 85 10*3/uL — AB (ref 140–400)
RBC: 2.31 10*6/uL — ABNORMAL LOW (ref 4.20–5.82)
RDW: 18.6 % — ABNORMAL HIGH (ref 11.0–14.6)
WBC: 4.8 10*3/uL (ref 4.0–10.3)
lymph#: 1.4 10*3/uL (ref 0.9–3.3)
nRBC: 0 % (ref 0–0)

## 2014-06-16 LAB — HOLD TUBE, BLOOD BANK

## 2014-06-16 MED ORDER — DARBEPOETIN ALFA 500 MCG/ML IJ SOSY
500.0000 ug | PREFILLED_SYRINGE | Freq: Once | INTRAMUSCULAR | Status: AC
  Administered 2014-06-16: 500 ug via SUBCUTANEOUS
  Filled 2014-06-16: qty 1

## 2014-06-16 NOTE — Progress Notes (Signed)
[name] DATE OF VISIT: 06/02/2014  [name], [name], [name] [address] [place] [address]  DIAGNOSIS: Anemia of chronic kidney disease and possible myelodysplastic syndrome.  CURRENT TREATMENT:    1.Aranesp 300 mcg every 4 weeks for hemoglobin less than 11 started on 09/23/13; increased to 500 mcg every 4 weeks in June 2015.  2. Review results of the bone marrow biopsy and aspirate done 05/19/2014 consistent with MDS. INTERVAL HISTORY:  [name] [name] 80 y.o. male who was referred by Dr. Nyoka [name] for evaluation of chronic anemia and thrombocytopenia and evaluated by Dr [name] [name] on 08/26/2013. Today, the patient is accompanied by his wife. He has dementia and provides limited history. Of [name], he has Chronic kidney disease with a creatinine clearance of 30 ml/min and he resides at [name]. He was last seen by Dr. Lona [name] on 06/02/2014.   As previously reported, his recent anemia work-up revealed a Hgb of 8.3 on 09/02/2012; Normal iron 87, B12 740, Folate > 20. He has not been on iron supplementation. His hgb on 08/13/13 revealed a hgb of 8.0. GI (Dr. Norberto Sorenson [name]. [name]) was consulted on 08/26/2013 for anemia. Last upper endoscopy on 12/13/2011 revealed mild gastritis with recommendations for a PPI q am long term. The patient's review of systems for symptoms of anemia was negative. He does have memory problems.   His WBC was normal at 4.2 on 09/08/13; Hemoglobin of 8.0; Hematocrit of 23.5; MCV was 113.5; Platelet count of 65. He has hypothyroidism and is compliant with levothyroxine 75 mcg, TSH 1.894 on 06/30/2013. Examining the plt trend revealed his thrombocytopenia started around 09/12/2011 following laparotomy by Dr. Lilyan [name] and repair of perforated ulcer. He has since been on nexium 40 mg bid. Of [name] he takes namenda for his dementia; lasix 20 mg daily for congestive heart failure; lantus for his diabetes. He is on folic acid  daily.   Today, he is accompanied by his wife.  He reports overall doing well.  He has had some improvement with his symptoms of fatigue since starting his aranesp shots. He also takes D32 pill daily, folic acid daily, Vitamin D and calcium and fish oil.  Recently he was treated with a course of avelox for his cough and bronchitis which he completed on 03/31/2014. He still have some residual cough. He also gets Tussionex syrup for cough. A Chest Xray 03/12/2014 showed an unchanged pleural thickening versus a small left pleural effusion in left lower lung. The right sided area of patchy pneumonitis or atelectasis has resolved. I reviewed all his clinical notes and medication list from SNF.  Last visit Dr. 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   PATIENT DID HAVE A BONE MARROW ASPIRATE AND BIOPSY DONE ON 05/19/2014.   Dr. Lona [name] asked [name] to consider going on treatment with Vidaza. Patient states that he and his wife and 2 children had a family meeting with extensive discussion around the subject and do not wish to proceed with Vidaza therapy.It is felt that he is not up to it from a performance status standpoint and would decrease his quality of life.  MEDICAL HISTORY: Past Medical History  Diagnosis Date  . Hypothyroidism   . Hypertension   . CHF (congestive heart failure)   .  Diabetes mellitus   . Coronary artery disease   . Restless leg syndrome   . Hyperlipemia   . Renal disease     stage II  . Sleep apnea   . Cellulitis and abscess of leg   . Obesity   . Duodenal ulcer, perforated   . Diverticulosis   . Internal hemorrhoids   . CAD (coronary artery disease)   . Anemia   . Duodenal ulcer   . Allergy   . Blood transfusion   . Cataract   . Tuberculosis     as a child  . Contact dermatitis and  other eczema, due to unspecified cause 10/06/2012  . Senile dementia with depressive features 05/26/2012  . Hyposmolality and/or hyponatremia 04/02/2012  . Diarrhea 03/26/2012  . Hemorrhage of rectum and anus 03/25/2012  . Pneumonia, organism unspecified 03/12/2012  . 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 Folate-deficiency anemia 01/07/2012  . Thrombocytopenia, unspecified 09/26/2011  . Loss of weight 09/11/2011  . Dysphagia, unspecified(787.20) 06/28/2011  . Chronic kidney disease, stage III (moderate) 03/22/2011  . Cramp of limb 08/10/2010  . Chronic kidney disease, stage II (mild) 03/22/2011  . Personal history of fall 05/05/2009  . Unspecified vitamin D deficiency 02/24/2009  . Blood in stool 11/18/2008  . Dermatophytosis of the body 06/23/2007  . Obesity, unspecified 06/23/2007  . Unspecified tinnitus 06/23/2007  . Edema 06/23/2007  . Shortness of breath 06/23/2007  . Flatulence, eructation, and gas pain 06/23/2007  . Other general symptoms(780.99) 06/23/2007  . Memory loss 08/13/2003    INTERIM HISTORY: has Anemia; BLOOD IN STOOL; Duodenal ulcer; CHF (congestive heart failure); CKD (chronic kidney disease); Respiratory failure; Type 2 diabetes mellitus with diabetic chronic kidney disease; Fall at nursing home; Depression; Dementia; Essential hypertension, benign; Hypothyroidism; Anemia in chronic kidney disease; Unspecified gastritis and gastroduodenitis without mention of hemorrhage; Urinary tract infection, site not specified; Acute bronchitis; Thrombocytopenia, unspecified; Hyperkalemia; Foot pain, left; Closed nondisplaced fracture of fifth left metatarsal bone; and MDS (myelodysplastic syndrome) on his problem list.    ALLERGIES:  is allergic to enalapril; metformin and related; and milk-related compounds.  MEDICATIONS: has a current medication list which includes the following prescription(s): acetaminophen, calcium carbonate-vitamin d,  cholecalciferol, darbepoetin alfa-polysorbate, duloxetine, fluticasone-salmeterol, folic acid, furosemide, insulin aspart, levothyroxine, memantine hcl er, metoprolol tartrate, sea-omega 30, omeprazole, spironolactone, tiotropium, vitamin b-12, vitamin c, and zinc, and the following Facility-Administered Medications: furosemide.  SURGICAL HISTORY:  Past Surgical History  Procedure Laterality Date  . Laparotomy  09/12/2011    Procedure: EXPLORATORY LAPAROTOMY;  Surgeon: Judieth Keens, DO;  Location: [facility];  Service: General;  Laterality: N/A;  REPAIR PERFORATED ULCER  . Gastrostomy  09/12/2011    Procedure: GASTROSTOMY;  Surgeon: Judieth Keens, DO;  Location: [facility];  Service: General;  Laterality: N/A;  BIOPSY DUODENAL ULCER  . Shoulder surgery  [place], [place]  . Jejunostomy feeding tube      removed  . Back surgery  1942  . Eye surgery  [phone]    cataracts  . Coronary artery bypass graft  2201    REVIEW OF SYSTEMS:   Constitutional: Denies fevers, chills or abnormal weight loss Eyes: Denies blurriness of vision Ears, nose, mouth, throat, and face: Denies mucositis or sore throat Respiratory: Denies cough, dyspnea or wheezes Cardiovascular: Denies palpitation, chest discomfort or lower extremity swelling Gastrointestinal:  Denies nausea, heartburn or change in bowel habits Skin: Denies abnormal skin rashes Lymphatics: Denies new lymphadenopathy or  easy bruising Neurological:Denies numbness, tingling or new weaknesses; + Memory problems Behavioral/Psych: Mood is stable, no new changes  All other systems were reviewed with the patient and are negative.  PHYSICAL EXAMINATION: ECOG PERFORMANCE STATUS: 2  Blood pressure 112/55, pulse 80, temperature 98.3 F (36.8 C), temperature source Oral, resp. rate 19, height $RemoveBe'5\' 8"'[id number]$  (1.727 m), weight 226 lb 3.2 oz (102.604 kg), SpO2 100 %.  GENERAL:alert, no distress and comfortable; obese sitting in wheelchair.  SKIN: skin  color, texture, turgor are normal, no rashes or significant lesions  EYES: normal, Conjunctiva are pink and non-injected, sclera clear  OROPHARYNX:no exudate, no erythema and lips, buccal mucosa, and tongue normal  NECK: supple, thyroid normal size, non-tender, without nodularity  LYMPH: no palpable lymphadenopathy in the cervical, axillary or inguinal  LUNGS: Clear to auscultation HEART: regular rate & rhythm and no murmurs and 1+ pitting edema  ABDOMEN:abdomen soft, non-tender and normal bowel sounds  Musculoskeletal:no cyanosis of digits and no clubbing  NEURO: alert & oriented x 3 with fluent speech, no focal motor/sensory deficits   Labs:     Chemistry      Component Value Date/Time   NA 133* 06/02/2014 1042   NA 135* 05/11/2014   NA 136 09/21/2011 0400   K 5.5* 06/02/2014 1042   K 4.4 05/11/2014   CL 99 09/21/2011 0400   CO2 25 06/02/2014 1042   CO2 30 09/21/2011 0400   BUN 38.9* 06/02/2014 1042   BUN 39* 05/11/2014   BUN 26* 09/21/2011 0400   CREATININE 1.7* 06/02/2014 1042   CREATININE 1.8* 05/11/2014   CREATININE 1.37* 09/21/2011 0400   GLU 133 04/08/2014      Component Value Date/Time   CALCIUM 9.2 06/02/2014 1042   CALCIUM 7.8* 09/21/2011 0400   ALKPHOS 97 05/05/2014 1227   ALKPHOS 66 04/08/2014   AST 8 05/05/2014 1227   AST 11* 04/08/2014   ALT 10 05/05/2014 1227   ALT 8* 04/08/2014   BILITOT 0.26 05/05/2014 1227   BILITOT 0.3 09/13/2011 0345      CBC Latest Ref Rng 06/16/2014 06/02/2014 06/02/2014  WBC 4.0 - 10.3 10e3/uL 4.8 5.6 5.6  Hemoglobin 13.0 - 17.1 g/dL 8.7(L) 9.4(L) 9.4(A)  Hematocrit 38.4 - 49.9 % 26.0(L) 27.5(L) 28(A)  Platelets 140 - 400 10e3/uL 85(L) 77(L) 77(A)     RADIOGRAPHIC STUDIES: No results found.  ASSESSMENT: [name] 80 y.o. male with a history of Anemia of chronic disease, renal failure and possibly myelodysplastic syndrome.  PLAN:   1. Anemia of mixed etiology including anemia of chronic kidney disease.  --It  is also megablastic, hypoproliferative anemia. He had a normal MCV prior to lap secondary to perforated ulcer. His MCV was normocytic on 09/11/2012 prior to procedure. He is maintained on folic with normal folate and vitamin B-12 levels. In addition, his TSH is normal and he has normal liver function. Other considerations would be MDS, MM or other primary bone marrow bleedings. In addition, he could have some anemia related to his chronic kidney disease with creatinine clearance of 30. His EPO level is low for the degree of his anemia.   --Thus, we started aranesp 300 mcg monthly on 02/25 with a goal hemoglobin greater than 10. His hemoglobin is 8.7 today.  Given his advanced age and co-morbidities, he was less inclined to obtain a bone marrow biopsy at this point.  Based on his initial response, we will continue his aranesp at 500 mcg monthly.   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 That is likely playing a major role in his anemia. The patient is not interested in proceeding with Vidaza therapy. Patient was reviewed with Dr. Lona [name] and also discussed with Dr. Alvy Bimler as she will be taking over the patient's care. We will change the frequency of his Aranesp from 500 micrograms given every 4 weeks to Aranesp 500 g given every 3 weeks. We will follow up with Dr. Simeon Craft such on 07/28/2014. Prior to that visit we'll obtain a CBC and B-12 as well as the Aranesp injection on 07/07/2014.   2. CKDz. --His creatinine is 1.7 with creatinine clearance of 33 mL/min.  Avoid nephrotoxins.    3. Thrombocytopenia, chronic.  --He denies any symptoms of bleeding.  Platelet count is 85,000.  4. Follow-up.  --He will follow up with Dr. Alvy Bimler on 07/28/2014 with labs and an injection appointment for his Aranesp which is now being given 500 g every 3 weeks. [place] in 2 weeks and will continue aranesp 500 mcg monthly.     5. Nutritional deficiencies: Patient is on b12, folate and only other possibility is for him to have a vitamin c or zinc deficiency which can also contribute to anemia.Per Dr. Lona [name] he was placed on vitamin C 500 mg daily and zinc 50 mg daily to see how his hemoglobin responds to that.   All questions were answered. The patient knows to call the clinic with any problems, questions or concerns. We can certainly see the patient much sooner if necessary. He will get a flu shot at [name].  I spent 25 minutes counseling the patient face to face. The total time spent in the appointment was 35 minutes.    Carlton Adam, PA-C 06/16/2014

## 2014-06-16 NOTE — Patient Instructions (Addendum)
Darbepoetin Alfa injection What is this medicine? DARBEPOETIN ALFA (dar be POE e tin AL fa) helps your body make more red blood cells. It is used to treat anemia caused by chronic kidney failure and chemotherapy. This medicine may be used for other purposes; ask your health care provider or pharmacist if you have questions. COMMON BRAND NAME(S): Aranesp What should I tell my health care provider before I take this medicine? They need to know if you have any of these conditions: -blood clotting disorders or history of blood clots -cancer patient not on chemotherapy -cystic fibrosis -heart disease, such as angina, heart failure, or a history of a heart attack -hemoglobin level of 12 g/dL or greater -high blood pressure -low levels of folate, iron, or vitamin B12 -seizures -an unusual or allergic reaction to darbepoetin, erythropoietin, albumin, hamster proteins, latex, other medicines, foods, dyes, or preservatives -pregnant or trying to get pregnant -breast-feeding How should I use this medicine? This medicine is for injection into a vein or under the skin. It is usually given by a health care professional in a hospital or clinic setting. If you get this medicine at home, you will be taught how to prepare and give this medicine. Do not shake the solution before you withdraw a dose. Use exactly as directed. Take your medicine at regular intervals. Do not take your medicine more often than directed. It is important that you put your used needles and syringes in a special sharps container. Do not put them in a trash can. If you do not have a sharps container, call your pharmacist or healthcare provider to get one. Talk to your pediatrician regarding the use of this medicine in children. While this medicine may be used in children as young as 1 year for selected conditions, precautions do apply. Overdosage: If you think you have taken too much of this medicine contact a poison control center or  emergency room at once. NOTE: This medicine is only for you. Do not share this medicine with others. What if I miss a dose? If you miss a dose, take it as soon as you can. If it is almost time for your next dose, take only that dose. Do not take double or extra doses. What may interact with this medicine? Do not take this medicine with any of the following medications: -epoetin alfa This list may not describe all possible interactions. Give your health care provider a list of all the medicines, herbs, non-prescription drugs, or dietary supplements you use. Also tell them if you smoke, drink alcohol, or use illegal drugs. Some items may interact with your medicine. What should I watch for while using this medicine? Visit your prescriber or health care professional for regular checks on your progress and for the needed blood tests and blood pressure measurements. It is especially important for the doctor to make sure your hemoglobin level is in the desired range, to limit the risk of potential side effects and to give you the best benefit. Keep all appointments for any recommended tests. Check your blood pressure as directed. Ask your doctor what your blood pressure should be and when you should contact him or her. As your body makes more red blood cells, you may need to take iron, folic acid, or vitamin B supplements. Ask your doctor or health care provider which products are right for you. If you have kidney disease continue dietary restrictions, even though this medication can make you feel better. Talk with your doctor or health   care professional about the foods you eat and the vitamins that you take. What side effects may I notice from receiving this medicine? Side effects that you should report to your doctor or health care professional as soon as possible: -allergic reactions like skin rash, itching or hives, swelling of the face, lips, or tongue -breathing problems -changes in vision -chest  pain -confusion, trouble speaking or understanding -feeling faint or lightheaded, falls -high blood pressure -muscle aches or pains -pain, swelling, warmth in the leg -rapid weight gain -severe headaches -sudden numbness or weakness of the face, arm or leg -trouble walking, dizziness, loss of balance or coordination -seizures (convulsions) -swelling of the ankles, feet, hands -unusually weak or tired Side effects that usually do not require medical attention (report to your doctor or health care professional if they continue or are bothersome): -diarrhea -fever, chills (flu-like symptoms) -headaches -nausea, vomiting -redness, stinging, or swelling at site where injected This list may not describe all possible side effects. Call your doctor for medical advice about side effects. You may report side effects to FDA at 1-800-FDA-1088. Where should I keep my medicine? Keep out of the reach of children. Store in a refrigerator between 2 and 8 degrees C (36 and 46 degrees F). Do not freeze. Do not shake. Throw away any unused portion if using a single-dose vial. Throw away any unused medicine after the expiration date. NOTE: This sheet is a summary. It may not cover all possible information. If you have questions about this medicine, talk to your doctor, pharmacist, or health care provider.  2015, [employer]. (2008-06-29 10:23:57)  Frequency of your Aranesp has been changed to every 3 weeks Return in 3 weeks. Next Aranesp injection Follow-up with Dr. Alvy Bimler in 6 weeks

## 2014-06-16 NOTE — Telephone Encounter (Signed)
Pt confirmed labs/ov per 11/18 POF, gave pt AVS..... KJ °

## 2014-06-18 ENCOUNTER — Encounter: Payer: Self-pay | Admitting: Hematology

## 2014-06-30 ENCOUNTER — Encounter: Payer: Self-pay | Admitting: Nurse Practitioner

## 2014-06-30 ENCOUNTER — Ambulatory Visit: Payer: Medicare Other

## 2014-06-30 ENCOUNTER — Non-Acute Institutional Stay (SKILLED_NURSING_FACILITY): Payer: Medicare Other | Admitting: Nurse Practitioner

## 2014-06-30 DIAGNOSIS — E039 Hypothyroidism, unspecified: Secondary | ICD-10-CM

## 2014-06-30 DIAGNOSIS — I10 Essential (primary) hypertension: Secondary | ICD-10-CM

## 2014-06-30 DIAGNOSIS — K269 Duodenal ulcer, unspecified as acute or chronic, without hemorrhage or perforation: Secondary | ICD-10-CM

## 2014-06-30 DIAGNOSIS — E1122 Type 2 diabetes mellitus with diabetic chronic kidney disease: Secondary | ICD-10-CM

## 2014-06-30 DIAGNOSIS — N189 Chronic kidney disease, unspecified: Secondary | ICD-10-CM

## 2014-06-30 DIAGNOSIS — F329 Major depressive disorder, single episode, unspecified: Secondary | ICD-10-CM

## 2014-06-30 DIAGNOSIS — I5040 Unspecified combined systolic (congestive) and diastolic (congestive) heart failure: Secondary | ICD-10-CM

## 2014-06-30 DIAGNOSIS — F32A Depression, unspecified: Secondary | ICD-10-CM

## 2014-06-30 DIAGNOSIS — F0390 Unspecified dementia without behavioral disturbance: Secondary | ICD-10-CM

## 2014-06-30 DIAGNOSIS — D631 Anemia in chronic kidney disease: Secondary | ICD-10-CM

## 2014-06-30 DIAGNOSIS — J9610 Chronic respiratory failure, unspecified whether with hypoxia or hypercapnia: Secondary | ICD-10-CM

## 2014-06-30 NOTE — Assessment & Plan Note (Signed)
Corrected with Levothyroxine 14mcg, TSH 2.55 12/01/13 and 2.83 01/12/14.

## 2014-06-30 NOTE — Progress Notes (Signed)
Patient ID: [name] [name], male   DOB: [birth year], 80 y.o.   MRN: [id number]   Code Status: DNR  Allergies  Allergen Reactions  . Enalapril Other (See Comments)    cough  . Metformin And Related Diarrhea  . Milk-Related Compounds Other (See Comments)    Doesn't remember but its a mild reaction    Chief Complaint  Patient presents with  . Medical Management of Chronic Issues    HPI: Patient is a 80 y.o. male seen in the SNF at [facility] today for evaluation of chronic medical conditions.  Problem List Items Addressed This Visit    Type 2 diabetes mellitus with diabetic chronic kidney disease - Primary    Hgb A1c 7.6 12/01/13 04/08/14 Hgb A1c 7.7, Bun/creat 35/1.47 05/11/14 Hgb 7.1, Bun/creat 39/1.82 05/24/14 CBG am 200s, noon 200s, pm 300s-increase Lantus to 40 units sq daily. Continue Novolog 5 units with meals for CBG>150 06/07/14 CBG am in 200s-will increase Lantus to 44 units qs and continue Novolog with meals.  06/30/14 continue Lantus 44u, Novolog 5u with meals        Respiratory failure    Chronic cough, takes Spiriva and Advair.     Hypothyroidism    Corrected with Levothyroxine 68mcg, TSH 2.55 12/01/13 and 2.83 01/12/14.      Essential hypertension, benign    Controlled on Metoprolol 25mg  bid. off Losartan due to CKD. Creatinine 1.5-2.0.      Duodenal ulcer    Stable, takes Omeprazole 20mg  daily, no c/o stomach pain, indigestion, or melena.         Depression    Stabilized since Cymbalta was increased to 60mg  01/11/14. 01/11/14 wife was tearful and stated Mr. Abdou yelled at her. She said he has been getting angry easily, throwing objects to staff, and verbally threatening staff.  06/30/14 stable, continue Cymbalta 60mg  daily.        Dementia    Stable on Namenda. Continue SNF for care needs.        CHF (congestive heart failure)    compensated on Furosemide 40mg  and Spironolactone 25mg  daily, trace BLE edema-chronic. CXR 06/29/13 showed no  pulmonary vascular congestion.        Anemia in chronic kidney disease    Darbepoetin Alfa inj [facility]-last 01/13/14 02/10/14 Hgb 9.1 03/10/14 Hgb 8.6 05/05/14 Hematology: anemia of chronic kidney disease and possible myelodysplastic syndrome. Megablastic, hypoliferative anemia. Hgb 9.1. Aranesp 531mcg monthly. 05/19/14 CT guided bone marrow biopsy and 2 units of packed RBCS.  06/02/14 Hgb 9.4. MDS 06/30/14 no further aggressive tx.         Review of Systems:  Review of Systems  Constitutional: Negative for fever, chills, weight loss, malaise/fatigue and diaphoresis.  HENT: Positive for hearing loss. Negative for congestion, ear pain and sore throat.   Eyes: Negative for pain, discharge and redness.  Respiratory: Positive for cough and sputum production. Negative for shortness of breath and wheezing.        Clear sputum. Associated with swallowing. Better with chronic cough.  Cardiovascular: Positive for leg swelling (trace). Negative for chest pain, palpitations, orthopnea, claudication and PND.  Gastrointestinal: Negative for heartburn, abdominal pain, diarrhea, constipation and blood in stool.  Genitourinary: Positive for frequency. Negative for dysuria, urgency, hematuria and flank pain.  Musculoskeletal: Positive for back pain, joint pain and falls. Negative for myalgias and neck pain.       New left lateral foot pain-small bruise anterior left lateral malleolus-healed 06/02/14 fell when trying  to get in recliner-no observed injury  Skin: Negative for itching and rash.       Small open area right buttock.   Neurological: Negative for dizziness, tingling, tremors, sensory change, speech change, focal weakness, seizures, loss of consciousness, weakness and headaches.  Endo/Heme/Allergies: Negative for environmental allergies and polydipsia. Does not bruise/bleed easily.  Psychiatric/Behavioral: Positive for depression and memory loss. Negative for hallucinations. The patient  is nervous/anxious. The patient does not have insomnia.        Angry, throwing objects at staff, verbally threatening to others.      Past Medical History  Diagnosis Date  . Hypothyroidism   . Hypertension   . CHF (congestive heart failure)   . Diabetes mellitus   . Coronary artery disease   . Restless leg syndrome   . Hyperlipemia   . Renal disease     stage II  . Sleep apnea   . Cellulitis and abscess of leg   . Obesity   . Duodenal ulcer, perforated   . Diverticulosis   . Internal hemorrhoids   . CAD (coronary artery disease)   . Anemia   . Duodenal ulcer   . Allergy   . Blood transfusion   . Cataract   . Tuberculosis     as a child  . Contact dermatitis and other eczema, due to unspecified cause 10/06/2012  . Senile dementia with depressive features 05/26/2012  . Hyposmolality and/or hyponatremia 04/02/2012  . Diarrhea 03/26/2012  . Hemorrhage of rectum and anus 03/25/2012  . Pneumonia, organism unspecified 03/12/2012  . 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 Folate-deficiency anemia 01/07/2012  . Thrombocytopenia, unspecified 09/26/2011  . Loss of weight 09/11/2011  . Dysphagia, unspecified(787.20) 06/28/2011  . Chronic kidney disease, stage III (moderate) 03/22/2011  . Cramp of limb 08/10/2010  . Chronic kidney disease, stage II (mild) 03/22/2011  . Personal history of fall 05/05/2009  . Unspecified vitamin D deficiency 02/24/2009  . Blood in stool 11/18/2008  . Dermatophytosis of the body 06/23/2007  . Obesity, unspecified 06/23/2007  . Unspecified tinnitus 06/23/2007  . Edema 06/23/2007  . Shortness of breath 06/23/2007  . Flatulence, eructation, and gas pain 06/23/2007  . Other general symptoms(780.99) 06/23/2007  . Memory loss 08/13/2003   Past Surgical History  Procedure Laterality Date  . Laparotomy  09/12/2011    Procedure: EXPLORATORY LAPAROTOMY;  Surgeon: Judieth Keens, DO;  Location: [facility];  Service: General;   Laterality: N/A;  REPAIR PERFORATED ULCER  . Gastrostomy  09/12/2011    Procedure: GASTROSTOMY;  Surgeon: Judieth Keens, DO;  Location: [facility];  Service: General;  Laterality: N/A;  BIOPSY DUODENAL ULCER  . Shoulder surgery  [place], [place]  . Jejunostomy feeding tube      removed  . Back surgery  1942  . Eye surgery  [phone]    cataracts  . Coronary artery bypass graft  2201   Social History:   reports that he has quit smoking. His smoking use included Cigarettes. He smoked 0.00 packs per day. He has never used smokeless tobacco. He reports that he does not drink alcohol or use illicit drugs.  Family History  Problem Relation Age of Onset  . Stroke Father     Medications: Patient's Medications  New Prescriptions   No medications on file  Previous Medications   ACETAMINOPHEN (TYLENOL) 325 MG TABLET    Take 650 mg by mouth every 4 (four) hours as needed (for  pain).    CALCIUM CARBONATE-VITAMIN D (CALCIUM 600+D) 600-400 MG-UNIT PER TABLET    Take 1 tablet by mouth daily.   CHOLECALCIFEROL (VITAMIN D) 1000 UNITS TABLET    Take 1,000 Units by mouth daily.    DARBEPOETIN ALFA-POLYSORBATE (ARANESP, ALBUMIN FREE,) 500 MCG/ML INJECTION    Inject 500 mcg into the skin every 30 (thirty) days. Gets inject at [facility]   DULOXETINE (CYMBALTA) 60 MG CAPSULE    Take 60 mg by mouth daily.   FLUTICASONE-SALMETEROL (ADVAIR HFA) 45-21 MCG/ACT INHALER    Inhale 2 puffs into the lungs every 12 (twelve) hours.    FOLIC ACID (FOLVITE) 1 MG TABLET    Take 1 mg by mouth daily.    FUROSEMIDE (LASIX) 40 MG TABLET    Take 40 mg by mouth daily.   INSULIN ASPART (NOVOLOG) 100 UNIT/ML INJECTION    Inject 5 Units into the skin 3 (three) times daily before meals. For CBG>150   LEVOTHYROXINE (SYNTHROID, LEVOTHROID) 75 MCG TABLET    Take 75 mcg by mouth daily.    MEMANTINE HCL ER (NAMENDA XR) 28 MG CP24    Take 28 mg by mouth daily with breakfast.   METOPROLOL TARTRATE (LOPRESSOR) 25 MG TABLET    Take 25  mg by mouth 2 (two) times daily.   OMEGA-3 FATTY ACIDS (SEA-OMEGA 30) 1200 MG CAPS    Take 1 capsule by mouth daily.   OMEPRAZOLE (PRILOSEC) 20 MG CAPSULE    Take 1 capsule (20 mg total) by mouth daily.   SPIRONOLACTONE (ALDACTONE) 25 MG TABLET    Take 25 mg by mouth daily.   TIOTROPIUM (SPIRIVA) 18 MCG INHALATION CAPSULE    Place 18 mcg into inhaler and inhale daily. 2 puffs daily   VITAMIN B-12 (CYANOCOBALAMIN) 1000 MCG TABLET    Take 1,000 mcg by mouth daily.   VITAMIN C (ASCORBIC ACID) 500 MG TABLET    Take 500 mg by mouth daily.   ZINC 50 MG TABS    Take 50 mg by mouth daily.  Modified Medications   No medications on file  Discontinued Medications   No medications on file     Physical Exam: Physical Exam  Constitutional: He is oriented to person, place, and time. He appears well-developed and well-nourished. No distress.  HENT:  Head: Normocephalic and atraumatic.  Right Ear: External ear normal.  Left Ear: External ear normal.  Nose: Nose normal.  Mouth/Throat: Oropharynx is clear and moist. No oropharyngeal exudate.  Eyes: Conjunctivae and EOM are normal. Pupils are equal, round, and reactive to light.  Neck: Normal range of motion. Neck supple. No JVD present. No thyromegaly present.  Cardiovascular: Normal rate, regular rhythm and normal heart sounds.   No murmur heard. Pulmonary/Chest: Effort normal. No respiratory distress. He has no wheezes. He has rales in the right lower field and the left lower field. He exhibits no tenderness.  Bibasilar as prior.   Abdominal: Soft. Bowel sounds are normal. He exhibits no distension and no mass. There is no tenderness. There is no rebound.  Musculoskeletal: Normal range of motion. He exhibits edema (trace in BLE) and tenderness.  New left lateral foot pain-small bruise anterior left lateral malleolus-healed.   Lymphadenopathy:    He has no cervical adenopathy.  Neurological: He is alert and oriented to person, place, and time. He  has normal reflexes. No cranial nerve deficit. Coordination normal.  Skin: Skin is warm and dry. No rash noted. He is not diaphoretic. No erythema.  Small open area right buttock.  Psychiatric: Thought content normal. His mood appears anxious. His affect is angry and inappropriate. His affect is not blunt and not labile. His speech is not delayed, not tangential and not slurred. He is agitated and aggressive. He is not hyperactive, not slowed, not withdrawn, not actively hallucinating and not combative. Cognition and memory are impaired. He exhibits a depressed mood. He is communicative. He exhibits abnormal recent memory. He is attentive.    Filed Vitals:   06/30/14 1524  BP: 122/76  Pulse: 72  Temp: 98.1 F (36.7 C)  TempSrc: Tympanic  Resp: 20   Labs reviewed: Basic Metabolic Panel:  Recent Labs  12/01/13 01/12/14  04/07/14 1002 04/08/14 05/05/14 1227 05/11/14 06/02/14 1042  NA  --  135*  < > 133* 134* 135* 135* 133*  K  --  4.5  < > 5.3* 4.5 5.0 4.4 5.5*  CO2  --   --   < > 24  --  26  --  25  GLUCOSE  --   --   < > 185*  --  187*  --  137  BUN  --  46*  < > 33.7* 35* 32.7* 39* 38.9*  CREATININE  --  1.9*  < > 1.6* 1.5* 1.7* 1.8* 1.7*  CALCIUM  --   --   < > 8.8  --  9.1  --  9.2  TSH 2.55 2.83  --   --  2.51  --   --   --   < > = values in this interval not displayed. Liver Function Tests:  Recent Labs  09/10/13 1249 01/12/14 04/08/14 05/05/14 1227  AST 11 9* 11* 8  ALT 10 8* 8* 10  ALKPHOS 84 66 66 97  BILITOT 0.28  --   --  0.26  PROT 6.6  --   --  6.4  ALBUMIN 3.3*  --   --  3.1*   CBC:  Recent Labs  05/05/14 1227 05/19/14 0725 06/02/14 06/02/14 1042 06/16/14 1006  WBC 4.4 4.8 5.6 5.6 4.8  NEUTROABS 2.6  --   --  3.4 2.9  HGB 7.9* 10.6* 9.4* 9.4* 8.7*  HCT 24.2* 31.2* 28* 27.5* 26.0*  MCV 115.8* 109.5*  --  108.7* 112.6*  PLT 65* 77* 77* 77* 85*   Past Procedures:  CXR 06/29/13 showed there is mild patchy atelectasis or interstitial pneumonitis at  the right lung base appearing significantly improved.   Assessment/Plan Type 2 diabetes mellitus with diabetic chronic kidney disease Hgb A1c 7.6 12/01/13 04/08/14 Hgb A1c 7.7, Bun/creat 35/1.47 05/11/14 Hgb 7.1, Bun/creat 39/1.82 05/24/14 CBG am 200s, noon 200s, pm 300s-increase Lantus to 40 units sq daily. Continue Novolog 5 units with meals for CBG>150 06/07/14 CBG am in 200s-will increase Lantus to 44 units qs and continue Novolog with meals.  06/30/14 continue Lantus 44u, Novolog 5u with meals      Duodenal ulcer Stable, takes Omeprazole 20mg  daily, no c/o stomach pain, indigestion, or melena.       Hypothyroidism Corrected with Levothyroxine 78mcg, TSH 2.55 12/01/13 and 2.83 01/12/14.    Depression Stabilized since Cymbalta was increased to 60mg  01/11/14. 01/11/14 wife was tearful and stated Mr. Alexa yelled at her. She said he has been getting angry easily, throwing objects to staff, and verbally threatening staff.  06/30/14 stable, continue Cymbalta 60mg  daily.      CHF (congestive heart failure) compensated on Furosemide 40mg  and Spironolactone 25mg  daily, trace BLE edema-chronic. CXR  06/29/13 showed no pulmonary vascular congestion.      Dementia Stable on Namenda. Continue SNF for care needs.      Essential hypertension, benign Controlled on Metoprolol $RemoveBefor'25mg'[id number]$  bid. off Losartan due to CKD. Creatinine 1.5-2.0.    Respiratory failure Chronic cough, takes Spiriva and Advair.   Anemia in chronic kidney disease Darbepoetin Alfa inj [facility]-last 01/13/14 02/10/14 Hgb 9.1 03/10/14 Hgb 8.6 05/05/14 Hematology: anemia of chronic kidney disease and possible myelodysplastic syndrome. Megablastic, hypoliferative anemia. Hgb 9.1. Aranesp 5102mcg monthly. 05/19/14 CT guided bone marrow biopsy and 2 units of packed RBCS.  06/02/14 Hgb 9.4. MDS 06/30/14 no further aggressive tx.      Family/ Staff Communication: observe the patient  Goals of Care: SNF  Labs/tests  ordered: none

## 2014-06-30 NOTE — Assessment & Plan Note (Addendum)
compensated on Furosemide 40mg and Spironolactone 25mg daily, trace BLE edema-chronic. CXR 06/29/13 showed no pulmonary vascular congestion.   

## 2014-06-30 NOTE — Assessment & Plan Note (Signed)
Darbepoetin Alfa inj [facility]-last 01/13/14 02/10/14 Hgb 9.1 03/10/14 Hgb 8.6 05/05/14 Hematology: anemia of chronic kidney disease and possible myelodysplastic syndrome. Megablastic, hypoliferative anemia. Hgb 9.1. Aranesp 538mcg monthly. 05/19/14 CT guided bone marrow biopsy and 2 units of packed RBCS.  06/02/14 Hgb 9.4. MDS 06/30/14 no further aggressive tx.

## 2014-06-30 NOTE — Assessment & Plan Note (Signed)
Chronic cough, takes Spiriva and Advair.  

## 2014-06-30 NOTE — Assessment & Plan Note (Signed)
Stable on Namenda. Continue SNF for care needs.

## 2014-06-30 NOTE — Assessment & Plan Note (Signed)
Stable, takes Omeprazole 20mg daily, no c/o stomach pain, indigestion, or melena.    

## 2014-06-30 NOTE — Assessment & Plan Note (Signed)
Stabilized since Cymbalta was increased to 60mg 01/11/14. 01/11/14 wife was tearful and stated Mr. Tew yelled at her. She said he has been getting angry easily, throwing objects to staff, and verbally threatening staff.  06/30/14 stable, continue Cymbalta 60mg daily.    

## 2014-06-30 NOTE — Assessment & Plan Note (Signed)
Controlled on Metoprolol 25mg bid. off Losartan due to CKD. Creatinine 1.5-2.0.  

## 2014-06-30 NOTE — Assessment & Plan Note (Addendum)
Hgb A1c 7.6 12/01/13 04/08/14 Hgb A1c 7.7, Bun/creat 35/1.47 05/11/14 Hgb 7.1, Bun/creat 39/1.82 05/24/14 CBG am 200s, noon 200s, pm 300s-increase Lantus to 40 units sq daily. Continue Novolog 5 units with meals for CBG>150 06/07/14 CBG am in 200s-will increase Lantus to 44 units qs and continue Novolog with meals.  06/30/14 continue Lantus 44u, Novolog 5u with meals     

## 2014-07-07 ENCOUNTER — Other Ambulatory Visit (HOSPITAL_BASED_OUTPATIENT_CLINIC_OR_DEPARTMENT_OTHER): Payer: Medicare Other

## 2014-07-07 ENCOUNTER — Telehealth: Payer: Self-pay | Admitting: Hematology and Oncology

## 2014-07-07 ENCOUNTER — Ambulatory Visit (HOSPITAL_BASED_OUTPATIENT_CLINIC_OR_DEPARTMENT_OTHER): Payer: Medicare Other

## 2014-07-07 ENCOUNTER — Other Ambulatory Visit: Payer: Self-pay | Admitting: Hematology and Oncology

## 2014-07-07 DIAGNOSIS — D638 Anemia in other chronic diseases classified elsewhere: Secondary | ICD-10-CM

## 2014-07-07 DIAGNOSIS — N189 Chronic kidney disease, unspecified: Secondary | ICD-10-CM

## 2014-07-07 DIAGNOSIS — D469 Myelodysplastic syndrome, unspecified: Secondary | ICD-10-CM

## 2014-07-07 DIAGNOSIS — N183 Chronic kidney disease, stage 3 (moderate): Secondary | ICD-10-CM

## 2014-07-07 DIAGNOSIS — D631 Anemia in chronic kidney disease: Secondary | ICD-10-CM

## 2014-07-07 DIAGNOSIS — D649 Anemia, unspecified: Secondary | ICD-10-CM

## 2014-07-07 LAB — CBC WITH DIFFERENTIAL/PLATELET
BASO%: 0.2 % (ref 0.0–2.0)
Basophils Absolute: 0 10*3/uL (ref 0.0–0.1)
EOS%: 2.4 % (ref 0.0–7.0)
Eosinophils Absolute: 0.1 10*3/uL (ref 0.0–0.5)
HEMATOCRIT: 23.7 % — AB (ref 38.4–49.9)
HGB: 7.9 g/dL — ABNORMAL LOW (ref 13.0–17.1)
LYMPH%: 26.5 % (ref 14.0–49.0)
MCH: 38.5 pg — AB (ref 27.2–33.4)
MCHC: 33.3 g/dL (ref 32.0–36.0)
MCV: 115.6 fL — AB (ref 79.3–98.0)
MONO#: 0.2 10*3/uL (ref 0.1–0.9)
MONO%: 4.9 % (ref 0.0–14.0)
NEUT#: 3.1 10*3/uL (ref 1.5–6.5)
NEUT%: 66 % (ref 39.0–75.0)
NRBC: 0 % (ref 0–0)
Platelets: 82 10*3/uL — ABNORMAL LOW (ref 140–400)
RBC: 2.05 10*6/uL — AB (ref 4.20–5.82)
RDW: 18.8 % — ABNORMAL HIGH (ref 11.0–14.6)
WBC: 4.7 10*3/uL (ref 4.0–10.3)
lymph#: 1.2 10*3/uL (ref 0.9–3.3)

## 2014-07-07 LAB — VITAMIN B12: Vitamin B-12: 945 pg/mL — ABNORMAL HIGH (ref 211–911)

## 2014-07-07 MED ORDER — DARBEPOETIN ALFA 500 MCG/ML IJ SOSY
500.0000 ug | PREFILLED_SYRINGE | Freq: Once | INTRAMUSCULAR | Status: AC
  Administered 2014-07-07: 500 ug via SUBCUTANEOUS
  Filled 2014-07-07: qty 1

## 2014-07-07 NOTE — Progress Notes (Signed)
Dr. Alvy Bimler aware of Hgb.  Patient scheduled for labs Monday, 07/12/14.  Patient denies any SOB, lightheadedness, dizziness or fatigue.  Patient is scheduled to see Dr Alvy Bimler 07/28/14.

## 2014-07-07 NOTE — Patient Instructions (Signed)
Darbepoetin Alfa injection What is this medicine? DARBEPOETIN ALFA (dar be POE e tin AL fa) helps your body make more red blood cells. It is used to treat anemia caused by chronic kidney failure and chemotherapy. This medicine may be used for other purposes; ask your health care provider or pharmacist if you have questions. COMMON BRAND NAME(S): Aranesp What should I tell my health care provider before I take this medicine? They need to know if you have any of these conditions: -blood clotting disorders or history of blood clots -cancer patient not on chemotherapy -cystic fibrosis -heart disease, such as angina, heart failure, or a history of a heart attack -hemoglobin level of 12 g/dL or greater -high blood pressure -low levels of folate, iron, or vitamin B12 -seizures -an unusual or allergic reaction to darbepoetin, erythropoietin, albumin, hamster proteins, latex, other medicines, foods, dyes, or preservatives -pregnant or trying to get pregnant -breast-feeding How should I use this medicine? This medicine is for injection into a vein or under the skin. It is usually given by a health care professional in a hospital or clinic setting. If you get this medicine at home, you will be taught how to prepare and give this medicine. Do not shake the solution before you withdraw a dose. Use exactly as directed. Take your medicine at regular intervals. Do not take your medicine more often than directed. It is important that you put your used needles and syringes in a special sharps container. Do not put them in a trash can. If you do not have a sharps container, call your pharmacist or healthcare provider to get one. Talk to your pediatrician regarding the use of this medicine in children. While this medicine may be used in children as young as 1 year for selected conditions, precautions do apply. Overdosage: If you think you have taken too much of this medicine contact a poison control center or  emergency room at once. NOTE: This medicine is only for you. Do not share this medicine with others. What if I miss a dose? If you miss a dose, take it as soon as you can. If it is almost time for your next dose, take only that dose. Do not take double or extra doses. What may interact with this medicine? Do not take this medicine with any of the following medications: -epoetin alfa This list may not describe all possible interactions. Give your health care provider a list of all the medicines, herbs, non-prescription drugs, or dietary supplements you use. Also tell them if you smoke, drink alcohol, or use illegal drugs. Some items may interact with your medicine. What should I watch for while using this medicine? Visit your prescriber or health care professional for regular checks on your progress and for the needed blood tests and blood pressure measurements. It is especially important for the doctor to make sure your hemoglobin level is in the desired range, to limit the risk of potential side effects and to give you the best benefit. Keep all appointments for any recommended tests. Check your blood pressure as directed. Ask your doctor what your blood pressure should be and when you should contact him or her. As your body makes more red blood cells, you may need to take iron, folic acid, or vitamin B supplements. Ask your doctor or health care provider which products are right for you. If you have kidney disease continue dietary restrictions, even though this medication can make you feel better. Talk with your doctor or health   care professional about the foods you eat and the vitamins that you take. What side effects may I notice from receiving this medicine? Side effects that you should report to your doctor or health care professional as soon as possible: -allergic reactions like skin rash, itching or hives, swelling of the face, lips, or tongue -breathing problems -changes in vision -chest  pain -confusion, trouble speaking or understanding -feeling faint or lightheaded, falls -high blood pressure -muscle aches or pains -pain, swelling, warmth in the leg -rapid weight gain -severe headaches -sudden numbness or weakness of the face, arm or leg -trouble walking, dizziness, loss of balance or coordination -seizures (convulsions) -swelling of the ankles, feet, hands -unusually weak or tired Side effects that usually do not require medical attention (report to your doctor or health care professional if they continue or are bothersome): -diarrhea -fever, chills (flu-like symptoms) -headaches -nausea, vomiting -redness, stinging, or swelling at site where injected This list may not describe all possible side effects. Call your doctor for medical advice about side effects. You may report side effects to FDA at 1-800-FDA-1088. Where should I keep my medicine? Keep out of the reach of children. Store in a refrigerator between 2 and 8 degrees C (36 and 46 degrees F). Do not freeze. Do not shake. Throw away any unused portion if using a single-dose vial. Throw away any unused medicine after the expiration date. NOTE: This sheet is a summary. It may not cover all possible information. If you have questions about this medicine, talk to your doctor, pharmacist, or health care provider.  2015, [employer]. (2008-06-29 10:23:57)  

## 2014-07-12 ENCOUNTER — Other Ambulatory Visit (HOSPITAL_BASED_OUTPATIENT_CLINIC_OR_DEPARTMENT_OTHER): Payer: Medicare Other

## 2014-07-12 ENCOUNTER — Telehealth: Payer: Self-pay | Admitting: *Deleted

## 2014-07-12 DIAGNOSIS — N189 Chronic kidney disease, unspecified: Secondary | ICD-10-CM

## 2014-07-12 DIAGNOSIS — D631 Anemia in chronic kidney disease: Secondary | ICD-10-CM

## 2014-07-12 DIAGNOSIS — D469 Myelodysplastic syndrome, unspecified: Secondary | ICD-10-CM

## 2014-07-12 LAB — CBC WITH DIFFERENTIAL/PLATELET
BASO%: 2.9 % — AB (ref 0.0–2.0)
Basophils Absolute: 0.2 10*3/uL — ABNORMAL HIGH (ref 0.0–0.1)
EOS%: 1.1 % (ref 0.0–7.0)
Eosinophils Absolute: 0.1 10*3/uL (ref 0.0–0.5)
HEMATOCRIT: 24.3 % — AB (ref 38.4–49.9)
HGB: 7.9 g/dL — ABNORMAL LOW (ref 13.0–17.1)
LYMPH%: 25.2 % (ref 14.0–49.0)
MCH: 38.9 pg — ABNORMAL HIGH (ref 27.2–33.4)
MCHC: 32.5 g/dL (ref 32.0–36.0)
MCV: 119.7 fL — AB (ref 79.3–98.0)
MONO#: 1.7 10*3/uL — ABNORMAL HIGH (ref 0.1–0.9)
MONO%: 29.6 % — AB (ref 0.0–14.0)
NEUT#: 2.3 10*3/uL (ref 1.5–6.5)
NEUT%: 41.2 % (ref 39.0–75.0)
PLATELETS: 79 10*3/uL — AB (ref 140–400)
RBC: 2.03 10*6/uL — AB (ref 4.20–5.82)
RDW: 20.1 % — ABNORMAL HIGH (ref 11.0–14.6)
WBC: 5.6 10*3/uL (ref 4.0–10.3)
lymph#: 1.4 10*3/uL (ref 0.9–3.3)

## 2014-07-12 LAB — HOLD TUBE, BLOOD BANK

## 2014-07-12 LAB — TECHNOLOGIST REVIEW

## 2014-07-12 NOTE — Telephone Encounter (Signed)
No need for transfusion today per Dr. Alvy Bimler.  Called pt on cell and home numbers.  Left VM asking him to call back if he has any problems.  Otherwise keep next appt as scheduled.

## 2014-07-13 ENCOUNTER — Telehealth: Payer: Self-pay | Admitting: *Deleted

## 2014-07-13 NOTE — Telephone Encounter (Signed)
Wife asks if pt's labs on Monday 07/19/14 can be drawn at [facility] and fax [place] results instead of pt having to come in for lab only appt?  Informed wife we can arrange this but if pt needs a transfusion he will need to come for type and cross and transfusion.  She verbalized understanding.  Called Nurse, Gerald Stabs, at [facility] phone [phone].  Gave her Verbal order for CBC w/ diff to be drawn on Monday 12/21 and fax [place] results.  She verbalized understanding and states they will make arrangements.

## 2014-07-13 NOTE — Telephone Encounter (Signed)
Gerald Stabs, Nurse at [facility] asks if ok to draw pt's labs next Tuesday 12/22 instead of Monday 12/21?  Informed her it is ok to do on Tuesday 12/22.

## 2014-07-15 ENCOUNTER — Telehealth: Payer: Self-pay | Admitting: *Deleted

## 2014-07-15 NOTE — Telephone Encounter (Signed)
Wife left VM states got a "robo- call" about pt's lab appt on Monday.  She though pt is getting his labs drawn at [facility].   Called her back and left VM that it is arrange for pt to have labs drawn at [facility] on Tuesday and I will cancel his lab appt here on Monday.   Reminded her to keep the next appt on 12/30 for lab/MD and injection as scheduled.

## 2014-07-19 ENCOUNTER — Other Ambulatory Visit: Payer: Medicare Other

## 2014-07-20 ENCOUNTER — Telehealth: Payer: Self-pay

## 2014-07-20 ENCOUNTER — Other Ambulatory Visit: Payer: Self-pay | Admitting: Hematology and Oncology

## 2014-07-20 DIAGNOSIS — D638 Anemia in other chronic diseases classified elsewhere: Secondary | ICD-10-CM

## 2014-07-20 LAB — CBC AND DIFFERENTIAL
HCT: 21 % — AB (ref 41–53)
Hemoglobin: 7 g/dL — AB (ref 13.5–17.5)
Platelets: 62 10*3/uL — AB (ref 150–399)
WBC: 4.6 10*3/mL

## 2014-07-20 NOTE — Telephone Encounter (Signed)
Called patient about lab results.  Left message to call Dr Calton Dach nurse back at [phone].

## 2014-07-21 ENCOUNTER — Ambulatory Visit: Payer: Medicare Other

## 2014-07-21 ENCOUNTER — Other Ambulatory Visit: Payer: Self-pay | Admitting: Nurse Practitioner

## 2014-07-21 ENCOUNTER — Other Ambulatory Visit: Payer: Medicare Other

## 2014-07-21 ENCOUNTER — Ambulatory Visit (HOSPITAL_COMMUNITY)
Admission: RE | Admit: 2014-07-21 | Discharge: 2014-07-21 | Disposition: A | Discharge status: Home / Self Care | Payer: Medicare Other | Source: Ambulatory Visit | Attending: Hematology | Admitting: Hematology

## 2014-07-21 VITALS — BP 103/78 | HR 94 | Temp 99.0°F | Resp 20

## 2014-07-21 DIAGNOSIS — N189 Chronic kidney disease, unspecified: Secondary | ICD-10-CM | POA: Insufficient documentation

## 2014-07-21 DIAGNOSIS — D469 Myelodysplastic syndrome, unspecified: Secondary | ICD-10-CM

## 2014-07-21 DIAGNOSIS — D638 Anemia in other chronic diseases classified elsewhere: Secondary | ICD-10-CM

## 2014-07-21 DIAGNOSIS — D631 Anemia in chronic kidney disease: Secondary | ICD-10-CM | POA: Diagnosis not present

## 2014-07-21 LAB — PREPARE RBC (CROSSMATCH)

## 2014-07-21 LAB — BASIC METABOLIC PANEL
BUN: 24 mg/dL — AB (ref 4–21)
Creatinine: 1 mg/dL (ref 0.6–1.3)
Glucose: 179 mg/dL
POTASSIUM: 4.2 mmol/L (ref 3.4–5.3)
Sodium: 137 mmol/L (ref 137–147)

## 2014-07-21 LAB — HEPATIC FUNCTION PANEL
ALT: 26 U/L (ref 10–40)
AST: 23 U/L (ref 14–40)
Alkaline Phosphatase: 203 U/L — AB (ref 25–125)
Bilirubin, Total: 0.7 mg/dL

## 2014-07-21 LAB — CBC AND DIFFERENTIAL
HCT: 34 % — AB (ref 41–53)
Hemoglobin: 11.3 g/dL — AB (ref 13.5–17.5)
Platelets: 208 10*3/uL (ref 150–399)
WBC: 5.7 10^3/mL

## 2014-07-21 MED ORDER — SODIUM CHLORIDE 0.9 % IJ SOLN
3.0000 mL | INTRAMUSCULAR | Status: DC | PRN
  Filled 2014-07-21: qty 10

## 2014-07-21 NOTE — Progress Notes (Signed)
Pt left without receiving Aranesp injection.  Spoke with on call MD, Dr Lindi Adie.  She said pt could receive the Aranesp at his next appt. 07/26/14

## 2014-07-21 NOTE — Patient Instructions (Signed)

## 2014-07-22 LAB — TYPE AND SCREEN
ABO/RH(D): O POS
ANTIBODY SCREEN: NEGATIVE
UNIT DIVISION: 0

## 2014-07-26 ENCOUNTER — Non-Acute Institutional Stay (SKILLED_NURSING_FACILITY): Payer: Medicare Other | Admitting: Nurse Practitioner

## 2014-07-26 ENCOUNTER — Other Ambulatory Visit: Payer: Self-pay | Admitting: Nurse Practitioner

## 2014-07-26 DIAGNOSIS — I509 Heart failure, unspecified: Secondary | ICD-10-CM

## 2014-07-26 DIAGNOSIS — N183 Chronic kidney disease, stage 3 (moderate): Secondary | ICD-10-CM

## 2014-07-26 DIAGNOSIS — I10 Essential (primary) hypertension: Secondary | ICD-10-CM

## 2014-07-26 DIAGNOSIS — E1122 Type 2 diabetes mellitus with diabetic chronic kidney disease: Secondary | ICD-10-CM

## 2014-07-26 DIAGNOSIS — F0390 Unspecified dementia without behavioral disturbance: Secondary | ICD-10-CM

## 2014-07-26 DIAGNOSIS — D469 Myelodysplastic syndrome, unspecified: Secondary | ICD-10-CM

## 2014-07-26 DIAGNOSIS — R5381 Other malaise: Secondary | ICD-10-CM

## 2014-07-26 DIAGNOSIS — F32A Depression, unspecified: Secondary | ICD-10-CM

## 2014-07-26 DIAGNOSIS — N189 Chronic kidney disease, unspecified: Secondary | ICD-10-CM

## 2014-07-26 DIAGNOSIS — D631 Anemia in chronic kidney disease: Secondary | ICD-10-CM

## 2014-07-26 DIAGNOSIS — F329 Major depressive disorder, single episode, unspecified: Secondary | ICD-10-CM

## 2014-07-26 DIAGNOSIS — E039 Hypothyroidism, unspecified: Secondary | ICD-10-CM

## 2014-07-26 DIAGNOSIS — K269 Duodenal ulcer, unspecified as acute or chronic, without hemorrhage or perforation: Secondary | ICD-10-CM

## 2014-07-26 DIAGNOSIS — J9692 Respiratory failure, unspecified with hypercapnia: Secondary | ICD-10-CM

## 2014-07-26 NOTE — Progress Notes (Signed)
Patient ID: [name] [name], male   DOB: May 22, 1920, 80 y.o.   MRN: [id number]   Code Status: DNR  Allergies  Allergen Reactions  . Enalapril Other (See Comments)    cough  . Metformin And Related Diarrhea  . Milk-Related Compounds Other (See Comments)    Doesn't remember but its a mild reaction    Chief Complaint  Patient presents with  . Medical Management of Chronic Issues  . Acute Visit    malaise.     HPI: Patient is a 80 y.o. male seen in the SNF at [facility] today for evaluation of chronic medical conditions.  Problem List Items Addressed This Visit    Type 2 diabetes mellitus with diabetic chronic kidney disease    Hgb A1c 7.6 12/01/13 04/08/14 Hgb A1c 7.7, Bun/creat 35/1.47 05/11/14 Hgb 7.1, Bun/creat 39/1.82 05/24/14 CBG am 200s, noon 200s, pm 300s-increase Lantus to 40 units sq daily. Continue Novolog 5 units with meals for CBG>150 06/07/14 CBG am in 200s-will increase Lantus to 44 units qs and continue Novolog with meals.  06/30/14 continue Lantus 44u, Novolog 5u with meals        Respiratory failure    Chronic cough, takes Spiriva and Advair.     MDS (myelodysplastic syndrome)    06/02/14 Bone marrow biopsy: the overall features favor a myelodysplastic state particularly refractory cytopenia with multi lineage dysplasia.Plan of tx: observation, blood transfusions, Aranesp, and Vidaza SQ(chemo).  07/20/14 Hgb 7.0-f/u oncology. S/p transfusion 07/26/14 update CBC     Hypothyroidism    Corrected with Levothyroxine 36mg, TSH 2.55 12/01/13 and 2.83 01/12/14. 07/26/14 update TSH      Essential hypertension, benign    Controlled on Metoprolol 266mbid. off Losartan due to CKD. Creatinine 1.5-2.0.     Duodenal ulcer    Stable, takes Omeprazole 2076maily, no c/o stomach pain, indigestion, or melena.       Depression    Stabilized since Cymbalta was increased to 59m73m15/15. 01/11/14 wife was tearful and stated [name] [name] at her. She said he  has been getting angry easily, throwing objects to staff, and verbally threatening staff.  06/30/14 stable, continue Cymbalta 59mg23mly.       Dementia    [place] Namenda R>B in setting of bone marrow dysplasia . Continue SNF for care needs    Complaint of debility and malaise - Primary    Multiple factorials: MDS, CHF, dementia, depression, and CKD-plan of care: no aggressive tx-DNR, dc supplements and Namenda. Update CBC and CMP     CKD (chronic kidney disease)    01/12/14 Bun/creat 46/1.86 05/11/14 Bun/creat 39/1.82    CHF (congestive heart failure)    compensated on Furosemide 40mg 75mSpironolactone 25mg d43m, trace BLE edema-chronic. CXR 06/29/13 showed no pulmonary vascular congestion.     Anemia in chronic kidney disease    Darbepoetin Alfa inj [facility]-last 01/13/14 02/10/14 Hgb 9.1 03/10/14 Hgb 8.6 05/05/14 Hematology: anemia of chronic kidney disease and possible myelodysplastic syndrome. Megablastic, hypoliferative anemia. Hgb 9.1. Aranesp 500mcg m83mly. 05/19/14 CT guided bone marrow biopsy and 2 units of packed RBCS.  06/02/14 Hgb 9.4. MDS 07/26/14 update CBC(07/28/14 at cancer center)        Review of Systems:  Review of Systems  Constitutional: Negative for fever, chills, weight loss, malaise/fatigue and diaphoresis.  HENT: Positive for hearing loss. Negative for congestion, ear pain and sore throat.   Eyes: Negative for pain, discharge and redness.  Respiratory: Positive for cough  and sputum production. Negative for shortness of breath and wheezing.        Clear sputum. Associated with swallowing. Better with chronic cough.  Cardiovascular: Positive for leg swelling (trace). Negative for chest pain, palpitations, orthopnea, claudication and PND.  Gastrointestinal: Negative for heartburn, abdominal pain, diarrhea, constipation and blood in stool.  Genitourinary: Positive for frequency. Negative for dysuria, urgency, hematuria and flank pain.  Musculoskeletal:  Positive for back pain, joint pain and falls. Negative for myalgias and neck pain.       New left lateral foot pain-small bruise anterior left lateral malleolus-healed 06/02/14 fell when trying to get in recliner-no observed injury  Skin: Negative for itching and rash.       Small open area right buttock.   Neurological: Negative for dizziness, tingling, tremors, sensory change, speech change, focal weakness, seizures, loss of consciousness, weakness and headaches.  Endo/Heme/Allergies: Negative for environmental allergies and polydipsia. Does not bruise/bleed easily.  Psychiatric/Behavioral: Positive for depression and memory loss. Negative for hallucinations. The patient is nervous/anxious. The patient does not have insomnia.        Angry, throwing objects at staff, verbally threatening to others.      Past Medical History  Diagnosis Date  . Hypothyroidism   . Hypertension   . CHF (congestive heart failure)   . Diabetes mellitus   . Coronary artery disease   . Restless leg syndrome   . Hyperlipemia   . Renal disease     stage II  . Sleep apnea   . Cellulitis and abscess of leg   . Obesity   . Duodenal ulcer, perforated   . Diverticulosis   . Internal hemorrhoids   . CAD (coronary artery disease)   . Anemia   . Duodenal ulcer   . Allergy   . Blood transfusion   . Cataract   . Tuberculosis     as a child  . Contact dermatitis and other eczema, due to unspecified cause 10/06/2012  . Senile dementia with depressive features 05/26/2012  . Hyposmolality and/or hyponatremia 04/02/2012  . Diarrhea 03/26/2012  . Hemorrhage of rectum and anus 03/25/2012  . Pneumonia, organism unspecified 03/12/2012  . 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 Folate-deficiency anemia 01/07/2012  . Thrombocytopenia, unspecified 09/26/2011  . Loss of weight 09/11/2011  . Dysphagia, unspecified(787.20) 06/28/2011  . Chronic kidney disease, stage III (moderate) 03/22/2011  .  Cramp of limb 08/10/2010  . Chronic kidney disease, stage II (mild) 03/22/2011  . Personal history of fall 05/05/2009  . Unspecified vitamin D deficiency 02/24/2009  . Blood in stool 11/18/2008  . Dermatophytosis of the body 06/23/2007  . Obesity, unspecified 06/23/2007  . Unspecified tinnitus 06/23/2007  . Edema 06/23/2007  . Shortness of breath 06/23/2007  . Flatulence, eructation, and gas pain 06/23/2007  . Other general symptoms(780.99) 06/23/2007  . Memory loss 08/13/2003   Past Surgical History  Procedure Laterality Date  . Laparotomy  09/12/2011    Procedure: EXPLORATORY LAPAROTOMY;  Surgeon: Judieth Keens, DO;  Location: [facility];  Service: General;  Laterality: N/A;  REPAIR PERFORATED ULCER  . Gastrostomy  09/12/2011    Procedure: GASTROSTOMY;  Surgeon: Judieth Keens, DO;  Location: [facility];  Service: General;  Laterality: N/A;  BIOPSY DUODENAL ULCER  . Shoulder surgery  [place], [place]  . Jejunostomy feeding tube      removed  . Back surgery  1942  . Eye surgery  [phone]  cataracts  . Coronary artery bypass graft  2201   Social History:   reports that he has quit smoking. His smoking use included Cigarettes. He smoked 0.00 packs per day. He has never used smokeless tobacco. He reports that he does not drink alcohol or use illicit drugs.  Family History  Problem Relation Age of Onset  . Stroke Father     Medications: Patient's Medications  New Prescriptions   No medications on file  Previous Medications   ACETAMINOPHEN (TYLENOL) 325 MG TABLET    Take 650 mg by mouth every 4 (four) hours as needed (for pain).    DARBEPOETIN ALFA-POLYSORBATE (ARANESP, ALBUMIN FREE,) 500 MCG/ML INJECTION    Inject 500 mcg into the skin every 30 (thirty) days. Gets inject at [facility]   DULOXETINE (CYMBALTA) 60 MG CAPSULE    Take 60 mg by mouth daily.   FLUTICASONE-SALMETEROL (ADVAIR HFA) 45-21 MCG/ACT INHALER    Inhale 2 puffs into the lungs every 12 (twelve) hours.     FOLIC ACID (FOLVITE) 1 MG TABLET    Take 1 mg by mouth daily.    FUROSEMIDE (LASIX) 40 MG TABLET    Take 40 mg by mouth daily.   INSULIN ASPART (NOVOLOG) 100 UNIT/ML INJECTION    Inject 5 Units into the skin 3 (three) times daily before meals. For CBG>150   LEVOTHYROXINE (SYNTHROID, LEVOTHROID) 75 MCG TABLET    Take 75 mcg by mouth daily.    METOPROLOL TARTRATE (LOPRESSOR) 25 MG TABLET    Take 25 mg by mouth 2 (two) times daily.   OMEPRAZOLE (PRILOSEC) 20 MG CAPSULE    Take 1 capsule (20 mg total) by mouth daily.   SPIRONOLACTONE (ALDACTONE) 25 MG TABLET    Take 25 mg by mouth daily.   TIOTROPIUM (SPIRIVA) 18 MCG INHALATION CAPSULE    Place 18 mcg into inhaler and inhale daily. 2 puffs daily   VITAMIN B-12 (CYANOCOBALAMIN) 1000 MCG TABLET    Take 1,000 mcg by mouth daily.  Modified Medications   No medications on file  Discontinued Medications   CALCIUM CARBONATE-VITAMIN D (CALCIUM 600+D) 600-400 MG-UNIT PER TABLET    Take 1 tablet by mouth daily.   CHOLECALCIFEROL (VITAMIN D) 1000 UNITS TABLET    Take 1,000 Units by mouth daily.    MEMANTINE HCL ER (NAMENDA XR) 28 MG CP24    Take 28 mg by mouth daily with breakfast.   OMEGA-3 FATTY ACIDS (SEA-OMEGA 30) 1200 MG CAPS    Take 1 capsule by mouth daily.   VITAMIN C (ASCORBIC ACID) 500 MG TABLET    Take 500 mg by mouth daily.   ZINC 50 MG TABS    Take 50 mg by mouth daily.     Physical Exam: Physical Exam  Constitutional: He is oriented to person, place, and time. He appears well-developed and well-nourished. No distress.  HENT:  Head: Normocephalic and atraumatic.  Right Ear: External ear normal.  Left Ear: External ear normal.  Nose: Nose normal.  Mouth/Throat: Oropharynx is clear and moist. No oropharyngeal exudate.  Eyes: Conjunctivae and EOM are normal. Pupils are equal, round, and reactive to light.  Neck: Normal range of motion. Neck supple. No JVD present. No thyromegaly present.  Cardiovascular: Normal rate, regular rhythm and  normal heart sounds.   No murmur heard. Pulmonary/Chest: Effort normal. No respiratory distress. He has no wheezes. He has rales in the right lower field and the left lower field. He exhibits no tenderness.  Bibasilar as prior.  Abdominal: Soft. Bowel sounds are normal. He exhibits no distension and no mass. There is no tenderness. There is no rebound.  Musculoskeletal: Normal range of motion. He exhibits edema (trace in BLE) and tenderness.  New left lateral foot pain-small bruise anterior left lateral malleolus-healed.   Lymphadenopathy:    He has no cervical adenopathy.  Neurological: He is alert and oriented to person, place, and time. He has normal reflexes. No cranial nerve deficit. Coordination normal.  Skin: Skin is warm and dry. No rash noted. He is not diaphoretic. No erythema.  Small open area right buttock.  Psychiatric: Thought content normal. His mood appears anxious. His affect is angry and inappropriate. His affect is not blunt and not labile. His speech is not delayed, not tangential and not slurred. He is agitated and aggressive. He is not hyperactive, not slowed, not withdrawn, not actively hallucinating and not combative. Cognition and memory are impaired. He exhibits a depressed mood. He is communicative. He exhibits abnormal recent memory. He is attentive.    Filed Vitals:   07/26/14 1435  BP: 118/72  Pulse: 76  Temp: 98 F (36.7 C)  TempSrc: Tympanic  Resp: 18   Labs reviewed: Basic Metabolic Panel:  Recent Labs  12/01/13 01/12/14  04/07/14 1002 04/08/14 05/05/14 1227 05/11/14 06/02/14 1042 07/21/14  NA  --  135*  < > 133* 134* 135* 135* 133* 137  K  --  4.5  < > 5.3* 4.5 5.0 4.4 5.5* 4.2  CO2  --   --   < > 24  --  26  --  25  --   GLUCOSE  --   --   < > 185*  --  187*  --  137  --   BUN  --  46*  < > 33.7* 35* 32.7* 39* 38.9* 24*  CREATININE  --  1.9*  < > 1.6* 1.5* 1.7* 1.8* 1.7* 1.0  CALCIUM  --   --   < > 8.8  --  9.1  --  9.2  --   TSH 2.55 2.83   --   --  2.51  --   --   --   --   < > = values in this interval not displayed. Liver Function Tests:  Recent Labs  09/10/13 1249  04/08/14 05/05/14 1227 07/21/14  AST 11  < > 11* 8 23  ALT 10  < > 8* 10 26  ALKPHOS 84  < > 66 97 203*  BILITOT 0.28  --   --  0.26  --   PROT 6.6  --   --  6.4  --   ALBUMIN 3.3*  --   --  3.1*  --   < > = values in this interval not displayed. CBC:  Recent Labs  06/16/14 1006 07/07/14 0946 07/12/14 1344 07/20/14 07/21/14  WBC 4.8 4.7 5.6 4.6 5.7  NEUTROABS 2.9 3.1 2.3  --   --   HGB 8.7* 7.9* 7.9* 7.0* 11.3*  HCT 26.0* 23.7* 24.3* 21* 34*  MCV 112.6* 115.6* 119.7*  --   --   PLT 85* 82* 79* 62* 208   Past Procedures:  CXR 06/29/13 showed there is mild patchy atelectasis or interstitial pneumonitis at the right lung base appearing significantly improved.   Assessment/Plan Anemia in chronic kidney disease Darbepoetin Alfa inj [facility]-last 01/13/14 02/10/14 Hgb 9.1 03/10/14 Hgb 8.6 05/05/14 Hematology: anemia of chronic kidney disease and possible myelodysplastic syndrome. Megablastic, hypoliferative anemia. Hgb  9.1. Aranesp 580mg monthly. 05/19/14 CT guided bone marrow biopsy and 2 units of packed RBCS.  06/02/14 Hgb 9.4. MDS 07/26/14 update CBC(07/28/14 at cancer center)   MDS (myelodysplastic syndrome) 06/02/14 Bone marrow biopsy: the overall features favor a myelodysplastic state particularly refractory cytopenia with multi lineage dysplasia.Plan of tx: observation, blood transfusions, Aranesp, and Vidaza SQ(chemo).  07/20/14 Hgb 7.0-f/u oncology. S/p transfusion 07/26/14 update CBC   Type 2 diabetes mellitus with diabetic chronic kidney disease Hgb A1c 7.6 12/01/13 04/08/14 Hgb A1c 7.7, Bun/creat 35/1.47 05/11/14 Hgb 7.1, Bun/creat 39/1.82 05/24/14 CBG am 200s, noon 200s, pm 300s-increase Lantus to 40 units sq daily. Continue Novolog 5 units with meals for CBG>150 06/07/14 CBG am in 200s-will increase Lantus to 44 units qs and  continue Novolog with meals.  06/30/14 continue Lantus 44u, Novolog 5u with meals      Hypothyroidism Corrected with Levothyroxine 731m, TSH 2.55 12/01/13 and 2.83 01/12/14. 07/26/14 update TSH    Essential hypertension, benign Controlled on Metoprolol 2517mid. off Losartan due to CKD. Creatinine 1.5-2.0.   Depression Stabilized since Cymbalta was increased to 31m67m15/15. 01/11/14 wife was tearful and stated Mr. ParkFanaled at her. She said he has been getting angry easily, throwing objects to staff, and verbally threatening staff.  06/30/14 stable, continue Cymbalta 31mg23mly.     Dementia [place] Namenda R>B in setting of bone marrow dysplasia . Continue SNF for care needs  Respiratory failure Chronic cough, takes Spiriva and Advair.   CHF (congestive heart failure) compensated on Furosemide 40mg 66mSpironolactone 25mg d6m, trace BLE edema-chronic. CXR 06/29/13 showed no pulmonary vascular congestion.   CKD (chronic kidney disease) 01/12/14 Bun/creat 46/1.86 05/11/14 Bun/creat 39/1.82  Duodenal ulcer Stable, takes Omeprazole 20mg da40m no c/o stomach pain, indigestion, or melena.     Complaint of debility and malaise Multiple factorials: MDS, CHF, dementia, depression, and CKD-plan of care: no aggressive tx-DNR, dc supplements and Namenda. Update CBC and CMP     Family/ Staff Communication: observe the patient  Goals of Care: SNF  Labs/tests ordered: CBC and CMP and TSH(may ask cancer center)

## 2014-07-26 NOTE — Assessment & Plan Note (Signed)
Chronic cough, takes Spiriva and Advair.

## 2014-07-26 NOTE — Assessment & Plan Note (Signed)
[place] Namenda R>B in setting of bone marrow dysplasia . Continue SNF for care needs

## 2014-07-26 NOTE — Assessment & Plan Note (Signed)
Hgb A1c 7.6 12/01/13 04/08/14 Hgb A1c 7.7, Bun/creat 35/1.47 05/11/14 Hgb 7.1, Bun/creat 39/1.82 05/24/14 CBG am 200s, noon 200s, pm 300s-increase Lantus to 40 units sq daily. Continue Novolog 5 units with meals for CBG>150 06/07/14 CBG am in 200s-will increase Lantus to 44 units qs and continue Novolog with meals.  06/30/14 continue Lantus 44u, Novolog 5u with meals

## 2014-07-26 NOTE — Assessment & Plan Note (Signed)
Stable, takes Omeprazole 20mg  daily, no c/o stomach pain, indigestion, or melena.

## 2014-07-26 NOTE — Assessment & Plan Note (Signed)
Corrected with Levothyroxine 26mcg, TSH 2.55 12/01/13 and 2.83 01/12/14. 07/26/14 update TSH

## 2014-07-26 NOTE — Assessment & Plan Note (Signed)
Multiple factorials: MDS, CHF, dementia, depression, and CKD-plan of care: no aggressive tx-DNR, dc supplements and Namenda. Update CBC and CMP

## 2014-07-26 NOTE — Assessment & Plan Note (Signed)
Stabilized since Cymbalta was increased to 60mg  01/11/14. 01/11/14 wife was tearful and stated [name] [name] yelled at her. She said he has been getting angry easily, throwing objects to staff, and verbally threatening staff.  06/30/14 stable, continue Cymbalta 60mg  daily.

## 2014-07-26 NOTE — Assessment & Plan Note (Addendum)
Darbepoetin Alfa inj [facility]-last 01/13/14 02/10/14 Hgb 9.1 03/10/14 Hgb 8.6 05/05/14 Hematology: anemia of chronic kidney disease and possible myelodysplastic syndrome. Megablastic, hypoliferative anemia. Hgb 9.1. Aranesp 54mcg monthly. 05/19/14 CT guided bone marrow biopsy and 2 units of packed RBCS.  06/02/14 Hgb 9.4. MDS 07/26/14 update CBC(07/28/14 at cancer center)

## 2014-07-26 NOTE — Assessment & Plan Note (Signed)
06/02/14 Bone marrow biopsy: the overall features favor a myelodysplastic state particularly refractory cytopenia with multi lineage dysplasia.Plan of tx: observation, blood transfusions, Aranesp, and Vidaza SQ(chemo).  07/20/14 Hgb 7.0-f/u oncology. S/p transfusion 07/26/14 update CBC

## 2014-07-26 NOTE — Assessment & Plan Note (Signed)
Controlled on Metoprolol 25mg  bid. off Losartan due to CKD. Creatinine 1.5-2.0.

## 2014-07-26 NOTE — Assessment & Plan Note (Signed)
compensated on Furosemide 40mg  and Spironolactone 25mg  daily, trace BLE edema-chronic. CXR 06/29/13 showed no pulmonary vascular congestion.

## 2014-07-26 NOTE — Assessment & Plan Note (Signed)
01/12/14 Bun/creat 46/1.86 05/11/14 Bun/creat 39/1.82

## 2014-07-27 ENCOUNTER — Other Ambulatory Visit: Payer: Self-pay | Admitting: Hematology and Oncology

## 2014-07-27 ENCOUNTER — Encounter: Payer: Self-pay | Admitting: Hematology and Oncology

## 2014-07-27 DIAGNOSIS — R634 Abnormal weight loss: Secondary | ICD-10-CM

## 2014-07-27 DIAGNOSIS — D469 Myelodysplastic syndrome, unspecified: Secondary | ICD-10-CM

## 2014-07-27 DIAGNOSIS — Z125 Encounter for screening for malignant neoplasm of prostate: Secondary | ICD-10-CM

## 2014-07-27 DIAGNOSIS — C61 Malignant neoplasm of prostate: Secondary | ICD-10-CM

## 2014-07-27 HISTORY — DX: Encounter for screening for malignant neoplasm of prostate: Z12.5

## 2014-07-27 HISTORY — DX: Abnormal weight loss: R63.4

## 2014-07-28 ENCOUNTER — Telehealth: Payer: Self-pay | Admitting: *Deleted

## 2014-07-28 ENCOUNTER — Telehealth: Payer: Self-pay | Admitting: Hematology and Oncology

## 2014-07-28 ENCOUNTER — Ambulatory Visit: Payer: Medicare Other

## 2014-07-28 ENCOUNTER — Ambulatory Visit (HOSPITAL_BASED_OUTPATIENT_CLINIC_OR_DEPARTMENT_OTHER): Payer: Medicare Other | Admitting: Hematology and Oncology

## 2014-07-28 ENCOUNTER — Encounter: Payer: Self-pay | Admitting: Hematology and Oncology

## 2014-07-28 ENCOUNTER — Other Ambulatory Visit (HOSPITAL_BASED_OUTPATIENT_CLINIC_OR_DEPARTMENT_OTHER): Payer: Medicare Other

## 2014-07-28 VITALS — BP 109/40 | HR 76 | Temp 97.7°F | Resp 19 | Ht 68.0 in

## 2014-07-28 DIAGNOSIS — N189 Chronic kidney disease, unspecified: Secondary | ICD-10-CM

## 2014-07-28 DIAGNOSIS — C61 Malignant neoplasm of prostate: Secondary | ICD-10-CM

## 2014-07-28 DIAGNOSIS — D631 Anemia in chronic kidney disease: Secondary | ICD-10-CM

## 2014-07-28 DIAGNOSIS — N182 Chronic kidney disease, stage 2 (mild): Secondary | ICD-10-CM

## 2014-07-28 DIAGNOSIS — D469 Myelodysplastic syndrome, unspecified: Secondary | ICD-10-CM

## 2014-07-28 DIAGNOSIS — D696 Thrombocytopenia, unspecified: Secondary | ICD-10-CM

## 2014-07-28 DIAGNOSIS — F0390 Unspecified dementia without behavioral disturbance: Secondary | ICD-10-CM

## 2014-07-28 DIAGNOSIS — E875 Hyperkalemia: Secondary | ICD-10-CM

## 2014-07-28 DIAGNOSIS — D6489 Other specified anemias: Secondary | ICD-10-CM

## 2014-07-28 LAB — COMPREHENSIVE METABOLIC PANEL (CC13)
ALBUMIN: 2.9 g/dL — AB (ref 3.5–5.0)
ALK PHOS: 87 U/L (ref 40–150)
ALT: 8 U/L (ref 0–55)
AST: 9 U/L (ref 5–34)
Anion Gap: 10 mEq/L (ref 3–11)
BILIRUBIN TOTAL: 0.42 mg/dL (ref 0.20–1.20)
BUN: 39.5 mg/dL — ABNORMAL HIGH (ref 7.0–26.0)
CO2: 25 meq/L (ref 22–29)
Calcium: 8.9 mg/dL (ref 8.4–10.4)
Chloride: 101 mEq/L (ref 98–109)
Creatinine: 1.7 mg/dL — ABNORMAL HIGH (ref 0.7–1.3)
EGFR: 35 mL/min/{1.73_m2} — ABNORMAL LOW (ref >90)
Glucose: 185 mg/dl — ABNORMAL HIGH (ref 70–140)
POTASSIUM: 5.3 meq/L — AB (ref 3.5–5.1)
SODIUM: 135 meq/L — AB (ref 136–145)
TOTAL PROTEIN: 6.4 g/dL (ref 6.4–8.3)

## 2014-07-28 LAB — CBC & DIFF AND RETIC
BASO%: 0.3 % (ref 0.0–2.0)
Basophils Absolute: 0 10*3/uL (ref 0.0–0.1)
EOS ABS: 0.2 10*3/uL (ref 0.0–0.5)
EOS%: 2.8 % (ref 0.0–7.0)
HCT: 25.3 % — ABNORMAL LOW (ref 38.4–49.9)
HEMOGLOBIN: 8.3 g/dL — AB (ref 13.0–17.1)
Immature Retic Fract: 16.9 % — ABNORMAL HIGH (ref 3.00–10.60)
LYMPH%: 25.4 % (ref 14.0–49.0)
MCH: 37.1 pg — ABNORMAL HIGH (ref 27.2–33.4)
MCHC: 32.8 g/dL (ref 32.0–36.0)
MCV: 112.9 fL — ABNORMAL HIGH (ref 79.3–98.0)
MONO#: 0.3 10*3/uL (ref 0.1–0.9)
MONO%: 4.5 % (ref 0.0–14.0)
NEUT%: 67 % (ref 39.0–75.0)
NEUTROS ABS: 3.9 10*3/uL (ref 1.5–6.5)
NRBC: 0 % (ref 0–0)
Platelets: 91 10*3/uL — ABNORMAL LOW (ref 140–400)
RBC: 2.24 10*6/uL — ABNORMAL LOW (ref 4.20–5.82)
RDW: 21.9 % — AB (ref 11.0–14.6)
Retic %: 2.14 % — ABNORMAL HIGH (ref 0.80–1.80)
Retic Ct Abs: 47.94 10*3/uL (ref 34.80–93.90)
WBC: 5.8 10*3/uL (ref 4.0–10.3)
lymph#: 1.5 10*3/uL (ref 0.9–3.3)

## 2014-07-28 LAB — CBC AND DIFFERENTIAL
HCT: 25 % — AB (ref 41–53)
Hemoglobin: 8.3 g/dL — AB (ref 13.5–17.5)
Platelets: 91 10*3/uL — AB (ref 150–399)
WBC: 5.8 10^3/mL

## 2014-07-28 LAB — TECHNOLOGIST REVIEW

## 2014-07-28 LAB — TSH: TSH: 2.24 u[IU]/mL (ref 0.41–5.90)

## 2014-07-28 MED ORDER — DARBEPOETIN ALFA 500 MCG/ML IJ SOSY
500.0000 ug | PREFILLED_SYRINGE | Freq: Once | INTRAMUSCULAR | Status: AC
  Administered 2014-07-28: 500 ug via SUBCUTANEOUS
  Filled 2014-07-28: qty 1

## 2014-07-28 NOTE — Telephone Encounter (Signed)
Pt confirmed labs/ov/inj per 12/30 POF, gave pt AVS.... KJ

## 2014-07-28 NOTE — Assessment & Plan Note (Signed)
This is due to mild acute on chronic renal failure. The nursing home is notified of this test result.

## 2014-07-28 NOTE — Assessment & Plan Note (Signed)
This is multi factorial, a combination of anemia chronic kidney disease and myelodysplastic syndrome. I am concerned, with his age, whether he may have undiagnosed prostate cancer that could sometimes causes this kind of anemia. I recommend checking PSA for further evaluation. In the meantime, we will continue Aranesp injection along with close blood monitoring. If his hemoglobin dropped to less than 8 g, I will give him 1 unit of blood transfusion.

## 2014-07-28 NOTE — Assessment & Plan Note (Signed)
The patient is not on any form of systemic treatment due to his age and comorbidities. I recommend increasing the frequency of Aranesp injection due to lack of response to anemia. I will recommend every other week dosing for 4 times and see him back in February. If it does not work, I might have to add additional treatment to treat his anemia.

## 2014-07-28 NOTE — Progress Notes (Signed)
[facility] FOLLOW-UP progress notes  Patient Care Team: Estill Dooms, MD as PCP - General (Internal Medicine) Man Mast X, NP as Nurse Practitioner (Nurse Practitioner)  CHIEF COMPLAINTS/PURPOSE OF VISIT:  Myelodysplastic syndrome, on erythropoietin stimulating agent  HISTORY OF PRESENTING ILLNESS:  [name] [name] 80 y.o. male was transferred to my care after his prior physician has left.  I reviewed the patient's records extensive and collaborated the history with the patient. Summary of his history is as follows: He was originally referred here for evaluation of chronic anemia and thrombocytopenia and evaluated by Dr Juliann Mule on 08/26/2013. He has dementia and provides limited history. Of note, he has Chronic kidney disease with a creatinine clearance of 30 ml/min and he resides at SNF at [facility]. As previously reported, his recent anemia work-up revealed a Hgb of 8.3 on 09/02/2012; Normal iron 87, B12 740, Folate > 20. He has not been on iron supplementation. His hgb on 08/13/13 revealed a hgb of 8.0. GI (Dr. Norberto Sorenson T. Dagoberto Ligas. MD) was consulted on 08/26/2013 for anemia. Last upper endoscopy on 12/13/2011 revealed mild gastritis with recommendations for a PPI q am long term. The patient's review of systems for symptoms of anemia was negative.Examining the plt trend revealed his thrombocytopenia started around 09/12/2011 following laparotomy by Dr. Lilyan Punt and repair of perforated ulcer. He has since been on nexium 40 mg bid. Of note he takes namenda for his dementia; lasix 20 mg daily for congestive heart failure; lantus for his diabetes. He is on folic acid daily. He had bone marrow aspirate and biopsy which confirmed myelodysplastic syndrome.   The patient is receiving erythropoietin stimulating agents for severe anemia. The frequency of the injections were increased due to lack of response. He has also been receiving intermittent blood transfusion. The patient denies  any recent signs or symptoms of bleeding such as spontaneous epistaxis, hematuria or hematochezia.  MEDICAL HISTORY:  Past Medical History  Diagnosis Date  . Hypothyroidism   . Hypertension   . CHF (congestive heart failure)   . Diabetes mellitus   . Coronary artery disease   . Restless leg syndrome   . Hyperlipemia   . Renal disease     stage II  . Sleep apnea   . Cellulitis and abscess of leg   . Obesity   . Duodenal ulcer, perforated   . Diverticulosis   . Internal hemorrhoids   . CAD (coronary artery disease)   . Anemia   . Duodenal ulcer   . Allergy   . Blood transfusion   . Cataract   . Tuberculosis     as a child  . Contact dermatitis and other eczema, due to unspecified cause 10/06/2012  . Senile dementia with depressive features 05/26/2012  . Hyposmolality and/or hyponatremia 04/02/2012  . Diarrhea 03/26/2012  . Hemorrhage of rectum and anus 03/25/2012  . Pneumonia, organism unspecified 03/12/2012  . 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 Folate-deficiency anemia 01/07/2012  . Thrombocytopenia, unspecified 09/26/2011  . Loss of weight 09/11/2011  . Dysphagia, unspecified(787.20) 06/28/2011  . Chronic kidney disease, stage III (moderate) 03/22/2011  . Cramp of limb 08/10/2010  . Chronic kidney disease, stage II (mild) 03/22/2011  . Personal history of fall 05/05/2009  . Unspecified vitamin D deficiency 02/24/2009  . Blood in stool 11/18/2008  . Dermatophytosis of the body 06/23/2007  . Obesity, unspecified 06/23/2007  . Unspecified tinnitus 06/23/2007  . Edema 06/23/2007  . Shortness of  breath 06/23/2007  . Flatulence, eructation, and gas pain 06/23/2007  . Other general symptoms(780.99) 06/23/2007  . Memory loss 08/13/2003  . Screening PSA (prostate specific antigen) 07/27/2014  . Prostate cancer screening 07/27/2014  . Weight loss 07/27/2014    SURGICAL HISTORY: Past Surgical History  Procedure Laterality Date  .  Laparotomy  09/12/2011    Procedure: EXPLORATORY LAPAROTOMY;  Surgeon: Judieth Keens, DO;  Location: [facility];  Service: General;  Laterality: N/A;  REPAIR PERFORATED ULCER  . Gastrostomy  09/12/2011    Procedure: GASTROSTOMY;  Surgeon: Judieth Keens, DO;  Location: [facility];  Service: General;  Laterality: N/A;  BIOPSY DUODENAL ULCER  . Shoulder surgery  [place], [place]  . Jejunostomy feeding tube      removed  . Back surgery  1942  . Eye surgery  [phone]    cataracts  . Coronary artery bypass graft  2201    SOCIAL HISTORY: History   Social History  . Marital Status: Married    Spouse Name: N/A    Number of Children: 2  . Years of Education: N/A   Occupational History  . retired Pharmacist, hospital    Social History Main Topics  . Smoking status: Former Smoker    Types: Cigarettes  . Smokeless tobacco: Never Used  . Alcohol Use: No  . Drug Use: No  . Sexual Activity: Not on file   Other Topics Concern  . Not on file   Social History Narrative    FAMILY HISTORY: Family History  Problem Relation Age of Onset  . Stroke Father     ALLERGIES:  is allergic to enalapril; metformin and related; and milk-related compounds.  MEDICATIONS:  Current Outpatient Prescriptions  Medication Sig Dispense Refill  . acetaminophen (TYLENOL) 325 MG tablet Take 650 mg by mouth every 4 (four) hours as needed (for pain).     . darbepoetin alfa-polysorbate (ARANESP, ALBUMIN FREE,) 500 MCG/ML injection Inject 500 mcg into the skin every 30 (thirty) days. Gets inject at [facility]    . DULoxetine (CYMBALTA) 60 MG capsule Take 60 mg by mouth daily.    . fluticasone-salmeterol (ADVAIR HFA) 45-21 MCG/ACT inhaler Inhale 2 puffs into the lungs every 12 (twelve) hours.     . folic acid (FOLVITE) 1 MG tablet Take 1 mg by mouth daily.     . furosemide (LASIX) 40 MG tablet Take 40 mg by mouth daily.    . insulin aspart (NOVOLOG) 100 UNIT/ML injection Inject 5 Units into the skin 3 (three) times  daily before meals. For CBG>150    . levothyroxine (SYNTHROID, LEVOTHROID) 75 MCG tablet Take 75 mcg by mouth daily.     . Memantine HCl ER (NAMENDA XR) 28 MG CP24 Take by mouth.    . metoprolol tartrate (LOPRESSOR) 25 MG tablet Take 25 mg by mouth 2 (two) times daily.    Marland Kitchen omeprazole (PRILOSEC) 20 MG capsule Take 1 capsule (20 mg total) by mouth daily. 30 capsule 3  . spironolactone (ALDACTONE) 25 MG tablet Take 25 mg by mouth daily.    Marland Kitchen tiotropium (SPIRIVA) 18 MCG inhalation capsule Place 18 mcg into inhaler and inhale daily. 2 puffs daily    . vitamin B-12 (CYANOCOBALAMIN) 1000 MCG tablet Take 1,000 mcg by mouth daily.     No current facility-administered medications for this visit.   Facility-Administered Medications Ordered in Other Visits  Medication Dose Route Frequency Provider Last Rate Last Dose  . furosemide (LASIX) injection 20 mg  20 mg Intravenous Once Aasim Marla Roe, MD   20 mg at 05/07/14 1352    REVIEW OF SYSTEMS:  Unreliable due to dementia All other systems were reviewed with the patient and are negative.  PHYSICAL EXAMINATION: ECOG PERFORMANCE STATUS: 2 - Symptomatic, <50% confined to bed  Filed Vitals:   07/28/14 1051  BP: 109/40  Pulse: 76  Temp: 97.7 F (36.5 C)  Resp: 19   Filed Weights    GENERAL:alert, no distress and comfortable. He looks elderly, sitting on the wheelchair SKIN: skin color is pale, texture, turgor are normal, no rashes or significant lesions EYES: normal, conjunctiva are pale and non-injected, sclera clear OROPHARYNX:no exudate, normal lips, buccal mucosa, and tongue. Poor dentition is noted NECK: supple, thyroid normal size, non-tender, without nodularity LYMPH:  no palpable lymphadenopathy in the cervical, axillary or inguinal LUNGS: clear to auscultation and percussion with normal breathing effort HEART: regular rate & rhythm and no murmurs with moderate bilateral lower extremity edema ABDOMEN:abdomen soft, non-tender and  normal bowel sounds Musculoskeletal:no cyanosis of digits and no clubbing  PSYCH: alert & oriented x 3 with fluent speech NEURO: no focal motor/sensory deficits  LABORATORY DATA:  I have reviewed the data as listed Lab Results  Component Value Date   WBC 5.8 07/28/2014   HGB 8.3* 07/28/2014   HCT 25.3* 07/28/2014   MCV 112.9* 07/28/2014   PLT 91* 07/28/2014    Recent Labs  09/10/13 1249  05/05/14 1227  06/02/14 1042 07/21/14 07/28/14 1035  NA 135*  < > 135*  < > 133* 137 135*  K 5.8*  < > 5.0  < > 5.5* 4.2 5.3*  CO2 23  < > 26  --  25  --  25  GLUCOSE 165*  < > 187*  --  137  --  185*  BUN 41.4*  < > 32.7*  < > 38.9* 24* 39.5*  CREATININE 1.8*  < > 1.7*  < > 1.7* 1.0 1.7*  CALCIUM 9.6  < > 9.1  --  9.2  --  8.9  PROT 6.6  --  6.4  --   --   --  6.4  ALBUMIN 3.3*  --  3.1*  --   --   --  2.9*  AST 11  < > 8  --   --  23 9  ALT 10  < > 10  --   --  26 8  ALKPHOS 84  < > 97  --   --  203* 87  BILITOT 0.28  --  0.26  --   --   --  0.42  < > = values in this interval not displayed.  ASSESSMENT & PLAN:  MDS (myelodysplastic syndrome) The patient is not on any form of systemic treatment due to his age and comorbidities. I recommend increasing the frequency of Aranesp injection due to lack of response to anemia. I will recommend every other week dosing for 4 times and see him back in February. If it does not work, I might have to add additional treatment to treat his anemia.  Anemia in chronic kidney disease This is multi factorial, a combination of anemia chronic kidney disease and myelodysplastic syndrome. I am concerned, with his age, whether he may have undiagnosed prostate cancer that could sometimes causes this kind of anemia. I recommend checking PSA for further evaluation. In the meantime, we will continue Aranesp injection along with close blood monitoring. If his hemoglobin dropped to less than  8 g, I will give him 1 unit of blood transfusion.  CKD (chronic kidney  disease) He has onset of acute on chronic kidney disease. I recommend close monitoring with his nursing home primary care provider.  Dementia This is currently stable and he is receiving medications for this.  Hyperkalemia This is due to mild acute on chronic renal failure. The nursing home is notified of this test result.  Thrombocytopenia The cause is related to his myelodysplastic syndrome. It is mild and there is little change compared from previous platelet count. The patient denies recent history of bleeding such as epistaxis, hematuria or hematochezia. He is asymptomatic from the thrombocytopenia. I will observe for now.  he does not require transfusion now.      Orders Placed This Encounter  Procedures  . Iron and TIBC    Standing Status: Future     Number of Occurrences:      Standing Expiration Date: 09/01/2015  . Ferritin    Standing Status: Future     Number of Occurrences:      Standing Expiration Date: 09/01/2015  . Erythropoietin    Standing Status: Future     Number of Occurrences:      Standing Expiration Date: 09/01/2015  . Hold Tube, Blood Bank    Standing Status: Standing     Number of Occurrences: 9     Standing Expiration Date: 07/29/2015    All questions were answered. The patient knows to call the clinic with any problems, questions or concerns. I spent 30 minutes counseling the patient face to face. The total time spent in the appointment was 40 minutes and more than 50% was on counseling.     [place], [place], MD 07/28/2014 12:02 PM

## 2014-07-28 NOTE — Assessment & Plan Note (Signed)
This is currently stable and he is receiving medications for this.

## 2014-07-28 NOTE — Assessment & Plan Note (Signed)
He has onset of acute on chronic kidney disease. I recommend close monitoring with his nursing home primary care provider.

## 2014-07-28 NOTE — Assessment & Plan Note (Signed)
The cause is related to his myelodysplastic syndrome. It is mild and there is little change compared from previous platelet count. The patient denies recent history of bleeding such as epistaxis, hematuria or hematochezia. He is asymptomatic from the thrombocytopenia. I will observe for now.  he does not require transfusion now.

## 2014-07-28 NOTE — Telephone Encounter (Signed)
Notified nurse at [facility] of patient's CMET today. Dr Alvy Bimler feels he might be dehydrated/renal failure. NP aware of labs will follow up.

## 2014-07-29 LAB — PSA: PSA: 0.56 ng/mL (ref <=4.00)

## 2014-08-02 ENCOUNTER — Other Ambulatory Visit: Payer: Self-pay | Admitting: Nurse Practitioner

## 2014-08-02 DIAGNOSIS — N189 Chronic kidney disease, unspecified: Secondary | ICD-10-CM

## 2014-08-02 DIAGNOSIS — D631 Anemia in chronic kidney disease: Secondary | ICD-10-CM

## 2014-08-02 DIAGNOSIS — E039 Hypothyroidism, unspecified: Secondary | ICD-10-CM

## 2014-08-11 ENCOUNTER — Ambulatory Visit (HOSPITAL_BASED_OUTPATIENT_CLINIC_OR_DEPARTMENT_OTHER): Payer: Medicare Other

## 2014-08-11 ENCOUNTER — Other Ambulatory Visit (HOSPITAL_BASED_OUTPATIENT_CLINIC_OR_DEPARTMENT_OTHER): Payer: Medicare Other

## 2014-08-11 DIAGNOSIS — N183 Chronic kidney disease, stage 3 (moderate): Secondary | ICD-10-CM

## 2014-08-11 DIAGNOSIS — N182 Chronic kidney disease, stage 2 (mild): Secondary | ICD-10-CM

## 2014-08-11 DIAGNOSIS — D631 Anemia in chronic kidney disease: Secondary | ICD-10-CM

## 2014-08-11 DIAGNOSIS — D469 Myelodysplastic syndrome, unspecified: Secondary | ICD-10-CM

## 2014-08-11 DIAGNOSIS — D6489 Other specified anemias: Secondary | ICD-10-CM

## 2014-08-11 LAB — CBC & DIFF AND RETIC
BASO%: 0.3 % (ref 0.0–2.0)
BASOS ABS: 0 10*3/uL (ref 0.0–0.1)
EOS%: 3.3 % (ref 0.0–7.0)
Eosinophils Absolute: 0.1 10*3/uL (ref 0.0–0.5)
HCT: 24.3 % — ABNORMAL LOW (ref 38.4–49.9)
HGB: 8.1 g/dL — ABNORMAL LOW (ref 13.0–17.1)
Immature Retic Fract: 14.8 % — ABNORMAL HIGH (ref 3.00–10.60)
LYMPH%: 28 % (ref 14.0–49.0)
MCH: 37.5 pg — AB (ref 27.2–33.4)
MCHC: 33.3 g/dL (ref 32.0–36.0)
MCV: 112.5 fL — AB (ref 79.3–98.0)
MONO#: 0.3 10*3/uL (ref 0.1–0.9)
MONO%: 6.4 % (ref 0.0–14.0)
NEUT#: 2.4 10*3/uL (ref 1.5–6.5)
NEUT%: 62 % (ref 39.0–75.0)
NRBC: 0 % (ref 0–0)
Platelets: 97 10*3/uL — ABNORMAL LOW (ref 140–400)
RBC: 2.16 10*6/uL — AB (ref 4.20–5.82)
RDW: 21.5 % — ABNORMAL HIGH (ref 11.0–14.6)
RETIC %: 2.35 % — AB (ref 0.80–1.80)
Retic Ct Abs: 50.76 10*3/uL (ref 34.80–93.90)
WBC: 3.9 10*3/uL — ABNORMAL LOW (ref 4.0–10.3)
lymph#: 1.1 10*3/uL (ref 0.9–3.3)

## 2014-08-11 LAB — COMPREHENSIVE METABOLIC PANEL (CC13)
ALK PHOS: 100 U/L (ref 40–150)
ALT: 9 U/L (ref 0–55)
AST: 12 U/L (ref 5–34)
Albumin: 3.3 g/dL — ABNORMAL LOW (ref 3.5–5.0)
Anion Gap: 7 mEq/L (ref 3–11)
BUN: 41.3 mg/dL — AB (ref 7.0–26.0)
CALCIUM: 8.6 mg/dL (ref 8.4–10.4)
CHLORIDE: 102 meq/L (ref 98–109)
CO2: 25 mEq/L (ref 22–29)
Creatinine: 1.7 mg/dL — ABNORMAL HIGH (ref 0.7–1.3)
EGFR: 34 mL/min/{1.73_m2} — AB (ref >90)
GLUCOSE: 154 mg/dL — AB (ref 70–140)
POTASSIUM: 4.6 meq/L (ref 3.5–5.1)
Sodium: 134 mEq/L — ABNORMAL LOW (ref 136–145)
Total Bilirubin: 0.37 mg/dL (ref 0.20–1.20)
Total Protein: 6.8 g/dL (ref 6.4–8.3)

## 2014-08-11 LAB — HOLD TUBE, BLOOD BANK

## 2014-08-11 MED ORDER — DARBEPOETIN ALFA 500 MCG/ML IJ SOSY
500.0000 ug | PREFILLED_SYRINGE | Freq: Once | INTRAMUSCULAR | Status: AC
  Administered 2014-08-11: 500 ug via SUBCUTANEOUS
  Filled 2014-08-11: qty 1

## 2014-08-11 NOTE — Patient Instructions (Signed)
Darbepoetin Alfa injection What is this medicine? DARBEPOETIN ALFA (dar be POE e tin AL fa) helps your body make more red blood cells. It is used to treat anemia caused by chronic kidney failure and chemotherapy. This medicine may be used for other purposes; ask your health care provider or pharmacist if you have questions. COMMON BRAND NAME(S): Aranesp What should I tell my health care provider before I take this medicine? They need to know if you have any of these conditions: -blood clotting disorders or history of blood clots -cancer patient not on chemotherapy -cystic fibrosis -heart disease, such as angina, heart failure, or a history of a heart attack -hemoglobin level of 12 g/dL or greater -high blood pressure -low levels of folate, iron, or vitamin B12 -seizures -an unusual or allergic reaction to darbepoetin, erythropoietin, albumin, hamster proteins, latex, other medicines, foods, dyes, or preservatives -pregnant or trying to get pregnant -breast-feeding How should I use this medicine? This medicine is for injection into a vein or under the skin. It is usually given by a health care professional in a hospital or clinic setting. If you get this medicine at home, you will be taught how to prepare and give this medicine. Do not shake the solution before you withdraw a dose. Use exactly as directed. Take your medicine at regular intervals. Do not take your medicine more often than directed. It is important that you put your used needles and syringes in a special sharps container. Do not put them in a trash can. If you do not have a sharps container, call your pharmacist or healthcare provider to get one. Talk to your pediatrician regarding the use of this medicine in children. While this medicine may be used in children as young as 1 year for selected conditions, precautions do apply. Overdosage: If you think you have taken too much of this medicine contact a poison control center or  emergency room at once. NOTE: This medicine is only for you. Do not share this medicine with others. What if I miss a dose? If you miss a dose, take it as soon as you can. If it is almost time for your next dose, take only that dose. Do not take double or extra doses. What may interact with this medicine? Do not take this medicine with any of the following medications: -epoetin alfa This list may not describe all possible interactions. Give your health care provider a list of all the medicines, herbs, non-prescription drugs, or dietary supplements you use. Also tell them if you smoke, drink alcohol, or use illegal drugs. Some items may interact with your medicine. What should I watch for while using this medicine? Visit your prescriber or health care professional for regular checks on your progress and for the needed blood tests and blood pressure measurements. It is especially important for the doctor to make sure your hemoglobin level is in the desired range, to limit the risk of potential side effects and to give you the best benefit. Keep all appointments for any recommended tests. Check your blood pressure as directed. Ask your doctor what your blood pressure should be and when you should contact him or her. As your body makes more red blood cells, you may need to take iron, folic acid, or vitamin B supplements. Ask your doctor or health care provider which products are right for you. If you have kidney disease continue dietary restrictions, even though this medication can make you feel better. Talk with your doctor or health   care professional about the foods you eat and the vitamins that you take. What side effects may I notice from receiving this medicine? Side effects that you should report to your doctor or health care professional as soon as possible: -allergic reactions like skin rash, itching or hives, swelling of the face, lips, or tongue -breathing problems -changes in vision -chest  pain -confusion, trouble speaking or understanding -feeling faint or lightheaded, falls -high blood pressure -muscle aches or pains -pain, swelling, warmth in the leg -rapid weight gain -severe headaches -sudden numbness or weakness of the face, arm or leg -trouble walking, dizziness, loss of balance or coordination -seizures (convulsions) -swelling of the ankles, feet, hands -unusually weak or tired Side effects that usually do not require medical attention (report to your doctor or health care professional if they continue or are bothersome): -diarrhea -fever, chills (flu-like symptoms) -headaches -nausea, vomiting -redness, stinging, or swelling at site where injected This list may not describe all possible side effects. Call your doctor for medical advice about side effects. You may report side effects to FDA at 1-800-FDA-1088. Where should I keep my medicine? Keep out of the reach of children. Store in a refrigerator between 2 and 8 degrees C (36 and 46 degrees F). Do not freeze. Do not shake. Throw away any unused portion if using a single-dose vial. Throw away any unused medicine after the expiration date. NOTE: This sheet is a summary. It may not cover all possible information. If you have questions about this medicine, talk to your doctor, pharmacist, or health care provider.  2015, [employer]. (2008-06-29 10:23:57)  

## 2014-08-16 ENCOUNTER — Non-Acute Institutional Stay (SKILLED_NURSING_FACILITY): Payer: Medicare Other | Admitting: Nurse Practitioner

## 2014-08-16 ENCOUNTER — Encounter: Payer: Self-pay | Admitting: Nurse Practitioner

## 2014-08-16 DIAGNOSIS — N183 Chronic kidney disease, stage 3 (moderate): Secondary | ICD-10-CM

## 2014-08-16 DIAGNOSIS — E875 Hyperkalemia: Secondary | ICD-10-CM

## 2014-08-16 DIAGNOSIS — N189 Chronic kidney disease, unspecified: Secondary | ICD-10-CM

## 2014-08-16 DIAGNOSIS — F32A Depression, unspecified: Secondary | ICD-10-CM

## 2014-08-16 DIAGNOSIS — D631 Anemia in chronic kidney disease: Secondary | ICD-10-CM

## 2014-08-16 DIAGNOSIS — E039 Hypothyroidism, unspecified: Secondary | ICD-10-CM

## 2014-08-16 DIAGNOSIS — F0390 Unspecified dementia without behavioral disturbance: Secondary | ICD-10-CM

## 2014-08-16 DIAGNOSIS — D469 Myelodysplastic syndrome, unspecified: Secondary | ICD-10-CM

## 2014-08-16 DIAGNOSIS — I509 Heart failure, unspecified: Secondary | ICD-10-CM

## 2014-08-16 DIAGNOSIS — F329 Major depressive disorder, single episode, unspecified: Secondary | ICD-10-CM

## 2014-08-16 DIAGNOSIS — I10 Essential (primary) hypertension: Secondary | ICD-10-CM

## 2014-08-16 DIAGNOSIS — E1122 Type 2 diabetes mellitus with diabetic chronic kidney disease: Secondary | ICD-10-CM

## 2014-08-16 DIAGNOSIS — K269 Duodenal ulcer, unspecified as acute or chronic, without hemorrhage or perforation: Secondary | ICD-10-CM

## 2014-08-16 NOTE — Assessment & Plan Note (Signed)
Stabilized since Cymbalta was increased to 60mg  01/11/14. 01/11/14 wife was tearful and stated [name] [name] yelled at her. She said he has been getting angry easily, throwing objects to staff, and verbally threatening staff.  -stable, continue Cymbalta 60mg  daily.

## 2014-08-16 NOTE — Progress Notes (Signed)
Patient ID: [name] [name], male   DOB: 01-26-1920, 81 y.o.   MRN: [id number]   Code Status: DNR  Allergies  Allergen Reactions  . Enalapril Other (See Comments)    cough  . Metformin And Related Diarrhea  . Milk-Related Compounds Other (See Comments)    Doesn't remember but its a mild reaction    Chief Complaint  Patient presents with  . Medical Management of Chronic Issues    HPI: Patient is a 81 y.o. male seen in the SNF at [facility] today for evaluation of blood sugar and chronic medical conditions.  Problem List Items Addressed This Visit    Type 2 diabetes mellitus with diabetic chronic kidney disease - Primary    Hgb A1c 7.6 12/01/13 04/08/14 Hgb A1c 7.7, Bun/creat 35/1.47 05/11/14 Hgb 7.1, Bun/creat 39/1.82 08/16/14 will repeat Hgb A1c. Continue Lantus 44u qd Novolog 5u with meals CBG>150 300s sometimes.        MDS (myelodysplastic syndrome)    06/02/14 Bone marrow biopsy: the overall features favor a myelodysplastic state particularly refractory cytopenia with multi lineage dysplasia.Plan of tx: observation, blood transfusions, Aranesp, and Vidaza SQ(chemo).  07/20/14 Hgb 7.0-f/u oncology. S/p transfusion 08/16/14-Oncology        Hypothyroidism    Corrected with Levothyroxine 67mcg, TSH 2.55 12/01/13 and 2.83 01/12/14. 07/28/14 TSH 2.243       Hyperkalemia    07/21/14 K 4.2      Essential hypertension, benign    Controlled on Metoprolol 25mg  bid. off Losartan due to CKD. Creatinine 1.5-2.0.        Duodenal ulcer    Stable, takes Omeprazole 20mg  daily, no c/o stomach pain, indigestion, or melena.         Depression    Stabilized since Cymbalta was increased to 60mg  01/11/14. 01/11/14 wife was tearful and stated Mr. Mcdaniel yelled at her. She said he has been getting angry easily, throwing objects to staff, and verbally threatening staff.  -stable, continue Cymbalta 60mg  daily.         Dementia    No change, off Namenda R>B in setting of bone  marrow dysplasia . Continue SNF for care needs       CKD (chronic kidney disease)    Creatinine 1.5-2.0      CHF (congestive heart failure)    compensated on Furosemide 40mg  and Spironolactone 25mg  daily, trace BLE edema-chronic. CXR 06/29/13 showed no pulmonary vascular congestion.      Anemia in chronic kidney disease    F/u Oncology, MDS.          Review of Systems:  Review of Systems  Constitutional: Positive for malaise/fatigue. Negative for fever, chills, weight loss and diaphoresis.  HENT: Positive for hearing loss. Negative for congestion, ear discharge, ear pain, nosebleeds, sore throat and tinnitus.   Eyes: Negative for blurred vision, double vision, photophobia, pain, discharge and redness.  Respiratory: Negative for cough, hemoptysis, sputum production, shortness of breath, wheezing and stridor.   Cardiovascular: Positive for leg swelling and PND. Negative for chest pain, palpitations, orthopnea and claudication.       Chronic venous insufficiency.   Gastrointestinal: Negative for heartburn, nausea, vomiting, abdominal pain, diarrhea, constipation, blood in stool and melena.  Genitourinary: Positive for frequency. Negative for dysuria, urgency, hematuria and flank pain.  Musculoskeletal: Positive for joint pain. Negative for myalgias, back pain, falls and neck pain.       Knees   Skin: Negative for itching and rash.  BLE chronic venous insufficiency.   Neurological: Negative for dizziness, tingling, tremors, sensory change, speech change, focal weakness, seizures, loss of consciousness, weakness and headaches.  Endo/Heme/Allergies: Negative for environmental allergies and polydipsia. Bruises/bleeds easily.  Psychiatric/Behavioral: Positive for depression and memory loss. Negative for suicidal ideas, hallucinations and substance abuse. The patient is not nervous/anxious and does not have insomnia.      Past Medical History  Diagnosis Date  . Hypothyroidism   .  Hypertension   . CHF (congestive heart failure)   . Diabetes mellitus   . Coronary artery disease   . Restless leg syndrome   . Hyperlipemia   . Renal disease     stage II  . Sleep apnea   . Cellulitis and abscess of leg   . Obesity   . Duodenal ulcer, perforated   . Diverticulosis   . Internal hemorrhoids   . CAD (coronary artery disease)   . Anemia   . Duodenal ulcer   . Allergy   . Blood transfusion   . Cataract   . Tuberculosis     as a child  . Contact dermatitis and other eczema, due to unspecified cause 10/06/2012  . Senile dementia with depressive features 05/26/2012  . Hyposmolality and/or hyponatremia 04/02/2012  . Diarrhea 03/26/2012  . Hemorrhage of rectum and anus 03/25/2012  . Pneumonia, organism unspecified 03/12/2012  . 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 Folate-deficiency anemia 01/07/2012  . Thrombocytopenia, unspecified 09/26/2011  . Loss of weight 09/11/2011  . Dysphagia, unspecified(787.20) 06/28/2011  . Chronic kidney disease, stage III (moderate) 03/22/2011  . Cramp of limb 08/10/2010  . Chronic kidney disease, stage II (mild) 03/22/2011  . Personal history of fall 05/05/2009  . Unspecified vitamin D deficiency 02/24/2009  . Blood in stool 11/18/2008  . Dermatophytosis of the body 06/23/2007  . Obesity, unspecified 06/23/2007  . Unspecified tinnitus 06/23/2007  . Edema 06/23/2007  . Shortness of breath 06/23/2007  . Flatulence, eructation, and gas pain 06/23/2007  . Other general symptoms(780.99) 06/23/2007  . Memory loss 08/13/2003  . Screening PSA (prostate specific antigen) 07/27/2014  . Prostate cancer screening 07/27/2014  . Weight loss 07/27/2014   Past Surgical History  Procedure Laterality Date  . Laparotomy  09/12/2011    Procedure: EXPLORATORY LAPAROTOMY;  Surgeon: Judieth Keens, DO;  Location: [facility];  Service: General;  Laterality: N/A;  REPAIR PERFORATED ULCER  . Gastrostomy  09/12/2011     Procedure: GASTROSTOMY;  Surgeon: Judieth Keens, DO;  Location: [facility];  Service: General;  Laterality: N/A;  BIOPSY DUODENAL ULCER  . Shoulder surgery  [place], [place]  . Jejunostomy feeding tube      removed  . Back surgery  1942  . Eye surgery  [phone]    cataracts  . Coronary artery bypass graft  2201   Social History:   reports that he has quit smoking. His smoking use included Cigarettes. He has never used smokeless tobacco. He reports that he does not drink alcohol or use illicit drugs.  Family History  Problem Relation Age of Onset  . Stroke Father     Medications: Patient's Medications  New Prescriptions   No medications on file  Previous Medications   ACETAMINOPHEN (TYLENOL) 325 MG TABLET    Take 650 mg by mouth every 4 (four) hours as needed (for pain).    DARBEPOETIN ALFA-POLYSORBATE (ARANESP, ALBUMIN FREE,) 500 MCG/ML INJECTION    Inject 500 mcg into the  skin every 30 (thirty) days. Gets inject at [facility]   DULOXETINE (CYMBALTA) 60 MG CAPSULE    Take 60 mg by mouth daily.   FLUTICASONE-SALMETEROL (ADVAIR HFA) 45-21 MCG/ACT INHALER    Inhale 2 puffs into the lungs every 12 (twelve) hours.    FOLIC ACID (FOLVITE) 1 MG TABLET    Take 1 mg by mouth daily.    FUROSEMIDE (LASIX) 40 MG TABLET    Take 40 mg by mouth daily.   INSULIN ASPART (NOVOLOG) 100 UNIT/ML INJECTION    Inject 5 Units into the skin 3 (three) times daily before meals. For CBG>150   LEVOTHYROXINE (SYNTHROID, LEVOTHROID) 75 MCG TABLET    Take 75 mcg by mouth daily.    METOPROLOL TARTRATE (LOPRESSOR) 25 MG TABLET    Take 25 mg by mouth 2 (two) times daily.   OMEPRAZOLE (PRILOSEC) 20 MG CAPSULE    Take 1 capsule (20 mg total) by mouth daily.   SPIRONOLACTONE (ALDACTONE) 25 MG TABLET    Take 25 mg by mouth daily.   TIOTROPIUM (SPIRIVA) 18 MCG INHALATION CAPSULE    Place 18 mcg into inhaler and inhale daily. 2 puffs daily   VITAMIN B-12 (CYANOCOBALAMIN) 1000 MCG TABLET    Take 1,000 mcg by mouth  daily.  Modified Medications   No medications on file  Discontinued Medications   MEMANTINE HCL ER (NAMENDA XR) 28 MG CP24    Take by mouth.     Physical Exam: Physical Exam  Constitutional: He is oriented to person, place, and time. He appears well-developed and well-nourished. No distress.  HENT:  Head: Normocephalic and atraumatic.  Right Ear: External ear normal.  Left Ear: External ear normal.  Nose: Nose normal.  Mouth/Throat: Oropharynx is clear and moist. No oropharyngeal exudate.  Eyes: Conjunctivae and EOM are normal. Pupils are equal, round, and reactive to light.  Neck: Normal range of motion. Neck supple. No JVD present. No thyromegaly present.  Cardiovascular: Normal rate, regular rhythm and normal heart sounds.   No murmur heard. Pulmonary/Chest: Effort normal. No respiratory distress. He has no wheezes. He has rales in the right lower field and the left lower field. He exhibits no tenderness.  Bibasilar as prior.   Abdominal: Soft. Bowel sounds are normal. He exhibits no distension and no mass. There is no tenderness. There is no rebound.  Musculoskeletal: Normal range of motion. He exhibits edema (trace in BLE) and tenderness.  New left lateral foot pain-small bruise anterior left lateral malleolus-healed.   Lymphadenopathy:    He has no cervical adenopathy.  Neurological: He is alert and oriented to person, place, and time. He has normal reflexes. No cranial nerve deficit. Coordination normal.  Skin: Skin is warm and dry. No rash noted. He is not diaphoretic. No erythema.  Small open area right buttock.  Psychiatric: Thought content normal. His mood appears anxious. His affect is angry and inappropriate. His affect is not blunt and not labile. His speech is not delayed, not tangential and not slurred. He is agitated and aggressive. He is not hyperactive, not slowed, not withdrawn, not actively hallucinating and not combative. Cognition and memory are impaired. He  exhibits a depressed mood. He is communicative. He exhibits abnormal recent memory. He is attentive.    There were no vitals filed for this visit. Labs reviewed: Basic Metabolic Panel:  Recent Labs  01/12/14  04/08/14  06/02/14 1042 07/21/14 07/28/14 07/28/14 1035 08/11/14 0926  NA 135*  < > 134*  < > 133*  137  --  135* 134*  K 4.5  < > 4.5  < > 5.5* 4.2  --  5.3* 4.6  CO2  --   < >  --   < > 25  --   --  25 25  GLUCOSE  --   < >  --   < > 137  --   --  185* 154*  BUN 46*  < > 35*  < > 38.9* 24*  --  39.5* 41.3*  CREATININE 1.9*  < > 1.5*  < > 1.7* 1.0  --  1.7* 1.7*  CALCIUM  --   < >  --   < > 9.2  --   --  8.9 8.6  TSH 2.83  --  2.51  --   --   --  2.24  --   --   < > = values in this interval not displayed. Liver Function Tests:  Recent Labs  05/05/14 1227 07/21/14 07/28/14 1035 08/11/14 0926  AST 8 23 9 12   ALT 10 26 8 9   ALKPHOS 97 203* 87 100  BILITOT 0.26  --  0.42 0.37  PROT 6.4  --  6.4 6.8  ALBUMIN 3.1*  --  2.9* 3.3*   CBC:  Recent Labs  07/12/14 1344  07/28/14 07/28/14 1034 08/11/14 0926  WBC 5.6  < > 5.8 5.8 3.9*  NEUTROABS 2.3  --   --  3.9 2.4  HGB 7.9*  < > 8.3* 8.3* 8.1*  HCT 24.3*  < > 25* 25.3* 24.3*  MCV 119.7*  --   --  112.9* 112.5*  PLT 79*  < > 91* 91* 97*  < > = values in this interval not displayed. Past Procedures:  CXR 06/29/13 showed there is mild patchy atelectasis or interstitial pneumonitis at the right lung base appearing significantly improved.   Assessment/Plan Type 2 diabetes mellitus with diabetic chronic kidney disease Hgb A1c 7.6 12/01/13 04/08/14 Hgb A1c 7.7, Bun/creat 35/1.47 05/11/14 Hgb 7.1, Bun/creat 39/1.82 08/16/14 will repeat Hgb A1c. Continue Lantus 44u qd Novolog 5u with meals CBG>150 300s sometimes.     MDS (myelodysplastic syndrome) 06/02/14 Bone marrow biopsy: the overall features favor a myelodysplastic state particularly refractory cytopenia with multi lineage dysplasia.Plan of tx: observation, blood  transfusions, Aranesp, and Vidaza SQ(chemo).  07/20/14 Hgb 7.0-f/u oncology. S/p transfusion 08/16/14-Oncology     Hypothyroidism Corrected with Levothyroxine 33mcg, TSH 2.55 12/01/13 and 2.83 01/12/14. 07/28/14 TSH 2.243    Hyperkalemia 07/21/14 K 4.2   Essential hypertension, benign Controlled on Metoprolol 25mg  bid. off Losartan due to CKD. Creatinine 1.5-2.0.     Duodenal ulcer Stable, takes Omeprazole 20mg  daily, no c/o stomach pain, indigestion, or melena.      Depression Stabilized since Cymbalta was increased to 60mg  01/11/14. 01/11/14 wife was tearful and stated Mr. Corkery yelled at her. She said he has been getting angry easily, throwing objects to staff, and verbally threatening staff.  -stable, continue Cymbalta 60mg  daily.      Dementia No change, off Namenda R>B in setting of bone marrow dysplasia . Continue SNF for care needs    CKD (chronic kidney disease) Creatinine 1.5-2.0   CHF (congestive heart failure) compensated on Furosemide 40mg  and Spironolactone 25mg  daily, trace BLE edema-chronic. CXR 06/29/13 showed no pulmonary vascular congestion.   Anemia in chronic kidney disease F/u Oncology, MDS.      Family/ Staff Communication: observe the patient  Goals of Care: SNF  Labs/tests ordered: Hgb A1c

## 2014-08-16 NOTE — Assessment & Plan Note (Signed)
compensated on Furosemide 40mg  and Spironolactone 25mg  daily, trace BLE edema-chronic. CXR 06/29/13 showed no pulmonary vascular congestion.

## 2014-08-16 NOTE — Assessment & Plan Note (Signed)
06/02/14 Bone marrow biopsy: the overall features favor a myelodysplastic state particularly refractory cytopenia with multi lineage dysplasia.Plan of tx: observation, blood transfusions, Aranesp, and Vidaza SQ(chemo).  07/20/14 Hgb 7.0-f/u oncology. S/p transfusion 08/16/14-Oncology

## 2014-08-16 NOTE — Assessment & Plan Note (Signed)
Controlled on Metoprolol 25mg  bid. off Losartan due to CKD. Creatinine 1.5-2.0.

## 2014-08-16 NOTE — Assessment & Plan Note (Signed)
Creatinine 1.5-2.0

## 2014-08-16 NOTE — Assessment & Plan Note (Signed)
07/21/14 K 4.2

## 2014-08-16 NOTE — Assessment & Plan Note (Signed)
Hgb A1c 7.6 12/01/13 04/08/14 Hgb A1c 7.7, Bun/creat 35/1.47 05/11/14 Hgb 7.1, Bun/creat 39/1.82 08/16/14 will repeat Hgb A1c. Continue Lantus 44u qd Novolog 5u with meals CBG>150 300s sometimes.

## 2014-08-16 NOTE — Assessment & Plan Note (Signed)
Corrected with Levothyroxine 41mcg, TSH 2.55 12/01/13 and 2.83 01/12/14. 07/28/14 TSH 2.243

## 2014-08-16 NOTE — Assessment & Plan Note (Signed)
F/u Oncology, MDS.

## 2014-08-16 NOTE — Assessment & Plan Note (Signed)
Stable, takes Omeprazole 20mg  daily, no c/o stomach pain, indigestion, or melena.

## 2014-08-16 NOTE — Assessment & Plan Note (Signed)
No change, off Namenda R>B in setting of bone marrow dysplasia . Continue SNF for care needs

## 2014-08-17 LAB — HEMOGLOBIN A1C: Hgb A1c MFr Bld: 8.3 % — AB (ref 4.0–6.0)

## 2014-08-18 ENCOUNTER — Other Ambulatory Visit: Payer: Self-pay | Admitting: Nurse Practitioner

## 2014-08-18 DIAGNOSIS — E1122 Type 2 diabetes mellitus with diabetic chronic kidney disease: Secondary | ICD-10-CM

## 2014-08-25 ENCOUNTER — Ambulatory Visit (HOSPITAL_BASED_OUTPATIENT_CLINIC_OR_DEPARTMENT_OTHER): Payer: Medicare Other

## 2014-08-25 ENCOUNTER — Ambulatory Visit (HOSPITAL_COMMUNITY)
Admission: RE | Admit: 2014-08-25 | Discharge: 2014-08-25 | Disposition: A | Discharge status: Home / Self Care | Payer: Medicare Other | Source: Ambulatory Visit | Attending: Hematology | Admitting: Hematology

## 2014-08-25 ENCOUNTER — Other Ambulatory Visit (HOSPITAL_BASED_OUTPATIENT_CLINIC_OR_DEPARTMENT_OTHER): Payer: Medicare Other

## 2014-08-25 ENCOUNTER — Ambulatory Visit: Payer: Medicare Other

## 2014-08-25 ENCOUNTER — Other Ambulatory Visit: Payer: Self-pay | Admitting: Hematology and Oncology

## 2014-08-25 VITALS — BP 127/53 | HR 80 | Temp 97.9°F | Resp 20

## 2014-08-25 DIAGNOSIS — D469 Myelodysplastic syndrome, unspecified: Secondary | ICD-10-CM | POA: Insufficient documentation

## 2014-08-25 DIAGNOSIS — D631 Anemia in chronic kidney disease: Secondary | ICD-10-CM

## 2014-08-25 DIAGNOSIS — N182 Chronic kidney disease, stage 2 (mild): Secondary | ICD-10-CM

## 2014-08-25 DIAGNOSIS — D6489 Other specified anemias: Secondary | ICD-10-CM

## 2014-08-25 LAB — CBC & DIFF AND RETIC
BASO%: 0 % (ref 0.0–2.0)
BASOS ABS: 0 10*3/uL (ref 0.0–0.1)
EOS ABS: 0.1 10*3/uL (ref 0.0–0.5)
EOS%: 2.3 % (ref 0.0–7.0)
HCT: 23.5 % — ABNORMAL LOW (ref 38.4–49.9)
HEMOGLOBIN: 7.9 g/dL — AB (ref 13.0–17.1)
Immature Retic Fract: 17.4 % — ABNORMAL HIGH (ref 3.00–10.60)
LYMPH#: 1.1 10*3/uL (ref 0.9–3.3)
LYMPH%: 25.2 % (ref 14.0–49.0)
MCH: 38.9 pg — ABNORMAL HIGH (ref 27.2–33.4)
MCHC: 33.6 g/dL (ref 32.0–36.0)
MCV: 115.8 fL — ABNORMAL HIGH (ref 79.3–98.0)
MONO#: 0.2 10*3/uL (ref 0.1–0.9)
MONO%: 5 % (ref 0.0–14.0)
NEUT%: 67.5 % (ref 39.0–75.0)
NEUTROS ABS: 2.9 10*3/uL (ref 1.5–6.5)
NRBC: 1 % — AB (ref 0–0)
Platelets: 86 10*3/uL — ABNORMAL LOW (ref 140–400)
RBC: 2.03 10*6/uL — ABNORMAL LOW (ref 4.20–5.82)
RDW: 21.7 % — ABNORMAL HIGH (ref 11.0–14.6)
Retic %: 3.65 % — ABNORMAL HIGH (ref 0.80–1.80)
Retic Ct Abs: 74.1 10*3/uL (ref 34.80–93.90)
WBC: 4.4 10*3/uL (ref 4.0–10.3)

## 2014-08-25 LAB — CBC AND DIFFERENTIAL
HEMATOCRIT: 24 % — AB (ref 41–53)
Hemoglobin: 7.9 g/dL — AB (ref 13.5–17.5)
Platelets: 86 10*3/uL — AB (ref 150–399)
WBC: 4.4 10*3/mL

## 2014-08-25 LAB — PREPARE RBC (CROSSMATCH)

## 2014-08-25 LAB — HOLD TUBE, BLOOD BANK

## 2014-08-25 MED ORDER — DARBEPOETIN ALFA 500 MCG/ML IJ SOSY
500.0000 ug | PREFILLED_SYRINGE | Freq: Once | INTRAMUSCULAR | Status: AC
  Administered 2014-08-25: 500 ug via SUBCUTANEOUS
  Filled 2014-08-25: qty 1

## 2014-08-25 MED ORDER — SODIUM CHLORIDE 0.9 % IV SOLN
250.0000 mL | Freq: Once | INTRAVENOUS | Status: AC
  Administered 2014-08-25: 250 mL via INTRAVENOUS

## 2014-08-25 NOTE — Patient Instructions (Signed)

## 2014-08-26 LAB — TYPE AND SCREEN
ABO/RH(D): O POS
Antibody Screen: NEGATIVE
Unit division: 0

## 2014-08-30 ENCOUNTER — Non-Acute Institutional Stay (SKILLED_NURSING_FACILITY): Payer: Medicare Other | Admitting: Nurse Practitioner

## 2014-08-30 ENCOUNTER — Encounter: Payer: Self-pay | Admitting: Nurse Practitioner

## 2014-08-30 DIAGNOSIS — F329 Major depressive disorder, single episode, unspecified: Secondary | ICD-10-CM

## 2014-08-30 DIAGNOSIS — E1122 Type 2 diabetes mellitus with diabetic chronic kidney disease: Secondary | ICD-10-CM

## 2014-08-30 DIAGNOSIS — F0390 Unspecified dementia without behavioral disturbance: Secondary | ICD-10-CM

## 2014-08-30 DIAGNOSIS — D469 Myelodysplastic syndrome, unspecified: Secondary | ICD-10-CM

## 2014-08-30 DIAGNOSIS — D631 Anemia in chronic kidney disease: Secondary | ICD-10-CM

## 2014-08-30 DIAGNOSIS — I509 Heart failure, unspecified: Secondary | ICD-10-CM

## 2014-08-30 DIAGNOSIS — N189 Chronic kidney disease, unspecified: Secondary | ICD-10-CM

## 2014-08-30 DIAGNOSIS — I10 Essential (primary) hypertension: Secondary | ICD-10-CM

## 2014-08-30 DIAGNOSIS — F32A Depression, unspecified: Secondary | ICD-10-CM

## 2014-08-30 DIAGNOSIS — E039 Hypothyroidism, unspecified: Secondary | ICD-10-CM

## 2014-08-30 DIAGNOSIS — K269 Duodenal ulcer, unspecified as acute or chronic, without hemorrhage or perforation: Secondary | ICD-10-CM

## 2014-08-30 NOTE — Assessment & Plan Note (Signed)
No change, off Namenda R>B in setting of bone marrow dysplasia . Continue SNF for care needs

## 2014-08-30 NOTE — Assessment & Plan Note (Signed)
06/02/14 Bone marrow biopsy: the overall features favor a myelodysplastic state particularly refractory cytopenia with multi lineage dysplasia.Plan of tx: observation, blood transfusions, Aranesp, and Vidaza SQ(chemo).  07/20/14 Hgb 7.0-f/u oncology.  08/25/14 Hgb 7.9, s/p transfusion  

## 2014-08-30 NOTE — Assessment & Plan Note (Signed)
compensated on Furosemide 40mg  and Spironolactone 25mg  daily, trace BLE edema-chronic. CXR 06/29/13 showed no pulmonary vascular congestion.

## 2014-08-30 NOTE — Assessment & Plan Note (Signed)
Darbepoetin Alfa inj [facility]-last 01/13/14 02/10/14 Hgb 9.1 03/10/14 Hgb 8.6 05/05/14 Hematology: anemia of chronic kidney disease and possible myelodysplastic syndrome. Megablastic, hypoliferative anemia. Hgb 9.1. Aranesp 572mcg monthly. 05/19/14 CT guided bone marrow biopsy and 2 units of packed RBCS.  06/02/14 Hgb 9.4. MDS 07/28/14 Hgb 8.3 08/25/14 Hgb 7.9

## 2014-08-30 NOTE — Assessment & Plan Note (Signed)
Stable, takes Omeprazole 20mg  daily, no c/o stomach pain, indigestion, or melena.

## 2014-08-30 NOTE — Progress Notes (Signed)
Patient ID: [name] [name], male   DOB: Aug 03, 1919, 81 y.o.   MRN: [id number]   Code Status: DNR  Allergies  Allergen Reactions  . Enalapril Other (See Comments)    cough  . Metformin And Related Diarrhea  . Milk-Related Compounds Other (See Comments)    Doesn't remember but its a mild reaction    Chief Complaint  Patient presents with  . Medical Management of Chronic Issues  . Acute Visit    blood sugar    HPI: Patient is a 81 y.o. male seen in the SNF at [facility] today for evaluation of blood sugar and chronic medical conditions.  Problem List Items Addressed This Visit    Type 2 diabetes mellitus with diabetic chronic kidney disease - Primary    Hgb A1c 7.6 12/01/13 04/08/14 Hgb A1c 7.7, Bun/creat 35/1.47 05/11/14 Hgb 7.1, Bun/creat 39/1.82 08/17/14 Hgb A1c 8.3 08/30/14 increase Lantus to 46u qd qd.        MDS (myelodysplastic syndrome)    06/02/14 Bone marrow biopsy: the overall features favor a myelodysplastic state particularly refractory cytopenia with multi lineage dysplasia.Plan of tx: observation, blood transfusions, Aranesp, and Vidaza SQ(chemo).  07/20/14 Hgb 7.0-f/u oncology.  08/25/14 Hgb 7.9, s/p transfusion       Hypothyroidism    Corrected with Levothyroxine 58mg, TSH 2.55 12/01/13 and 2.83 01/12/14. 07/28/14 TSH 2.243      Essential hypertension, benign    Controlled on Metoprolol 236mbid. off Losartan due to CKD. Creatinine 1.5-2.0.        Duodenal ulcer    Stable, takes Omeprazole 2062maily, no c/o stomach pain, indigestion, or melena.          Depression    Stabilized since Cymbalta was increased to 47m32m15/15. 01/11/14 wife was tearful and stated [name] at her. She said he has been getting angry easily, throwing objects to staff, and verbally threatening staff.  -stable, continue Cymbalta 47mg40mly.         Dementia    No change, off Namenda R>B in setting of bone marrow dysplasia . Continue SNF for care  needs        CHF (congestive heart failure)    compensated on Furosemide 40mg 78mSpironolactone 25mg d41m, trace BLE edema-chronic. CXR 06/29/13 showed no pulmonary vascular congestion.      Anemia in chronic kidney disease    Darbepoetin Alfa inj [facility]-last 01/13/14 02/10/14 Hgb 9.1 03/10/14 Hgb 8.6 05/05/14 Hematology: anemia of chronic kidney disease and possible myelodysplastic syndrome. Megablastic, hypoliferative anemia. Hgb 9.1. Aranesp 500mcg m79mly. 05/19/14 CT guided bone marrow biopsy and 2 units of packed RBCS.  06/02/14 Hgb 9.4. MDS 07/28/14 Hgb 8.3 08/25/14 Hgb 7.9          Review of Systems:  Review of Systems  Constitutional: Positive for malaise/fatigue. Negative for fever, chills, weight loss and diaphoresis.  HENT: Positive for hearing loss. Negative for congestion, ear discharge, ear pain, nosebleeds, sore throat and tinnitus.   Eyes: Negative for blurred vision, double vision, photophobia, pain, discharge and redness.  Respiratory: Positive for cough and sputum production. Negative for hemoptysis, shortness of breath, wheezing and stridor.   Cardiovascular: Positive for leg swelling. Negative for chest pain, palpitations, orthopnea and claudication.  Gastrointestinal: Negative for heartburn, nausea, vomiting, abdominal pain, diarrhea, constipation, blood in stool and melena.  Genitourinary: Positive for frequency. Negative for dysuria, urgency, hematuria and flank pain.  Musculoskeletal: Negative for myalgias, back pain, joint pain, falls and  neck pain.  Skin: Negative for itching and rash.  Neurological: Negative for dizziness, tingling, tremors, sensory change, speech change, focal weakness, seizures, loss of consciousness, weakness and headaches.  Endo/Heme/Allergies: Negative for environmental allergies and polydipsia.  Psychiatric/Behavioral: Positive for depression and memory loss. Negative for suicidal ideas, hallucinations and substance abuse.  The patient is nervous/anxious. The patient does not have insomnia.      Past Medical History  Diagnosis Date  . Hypothyroidism   . Hypertension   . CHF (congestive heart failure)   . Diabetes mellitus   . Coronary artery disease   . Restless leg syndrome   . Hyperlipemia   . Renal disease     stage II  . Sleep apnea   . Cellulitis and abscess of leg   . Obesity   . Duodenal ulcer, perforated   . Diverticulosis   . Internal hemorrhoids   . CAD (coronary artery disease)   . Anemia   . Duodenal ulcer   . Allergy   . Blood transfusion   . Cataract   . Tuberculosis     as a child  . Contact dermatitis and other eczema, due to unspecified cause 10/06/2012  . Senile dementia with depressive features 05/26/2012  . Hyposmolality and/or hyponatremia 04/02/2012  . Diarrhea 03/26/2012  . Hemorrhage of rectum and anus 03/25/2012  . Pneumonia, organism unspecified 03/12/2012  . 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 Folate-deficiency anemia 01/07/2012  . Thrombocytopenia, unspecified 09/26/2011  . Loss of weight 09/11/2011  . Dysphagia, unspecified(787.20) 06/28/2011  . Chronic kidney disease, stage III (moderate) 03/22/2011  . Cramp of limb 08/10/2010  . Chronic kidney disease, stage II (mild) 03/22/2011  . Personal history of fall 05/05/2009  . Unspecified vitamin D deficiency 02/24/2009  . Blood in stool 11/18/2008  . Dermatophytosis of the body 06/23/2007  . Obesity, unspecified 06/23/2007  . Unspecified tinnitus 06/23/2007  . Edema 06/23/2007  . Shortness of breath 06/23/2007  . Flatulence, eructation, and gas pain 06/23/2007  . Other general symptoms(780.99) 06/23/2007  . Memory loss 08/13/2003  . Screening PSA (prostate specific antigen) 07/27/2014  . Prostate cancer screening 07/27/2014  . Weight loss 07/27/2014   Past Surgical History  Procedure Laterality Date  . Laparotomy  09/12/2011    Procedure: EXPLORATORY LAPAROTOMY;  Surgeon:  Judieth Keens, DO;  Location: [facility];  Service: General;  Laterality: N/A;  REPAIR PERFORATED ULCER  . Gastrostomy  09/12/2011    Procedure: GASTROSTOMY;  Surgeon: Judieth Keens, DO;  Location: [facility];  Service: General;  Laterality: N/A;  BIOPSY DUODENAL ULCER  . Shoulder surgery  [place], [place]  . Jejunostomy feeding tube      removed  . Back surgery  1942  . Eye surgery  [phone]    cataracts  . Coronary artery bypass graft  2201   Social History:   reports that he has quit smoking. His smoking use included Cigarettes. He has never used smokeless tobacco. He reports that he does not drink alcohol or use illicit drugs.  Family History  Problem Relation Age of Onset  . Stroke Father     Medications: Patient's Medications  New Prescriptions   No medications on file  Previous Medications   ACETAMINOPHEN (TYLENOL) 325 MG TABLET    Take 650 mg by mouth every 4 (four) hours as needed (for pain).    DARBEPOETIN ALFA-POLYSORBATE (ARANESP, ALBUMIN FREE,) 500 MCG/ML INJECTION    Inject 500 mcg  into the skin every 30 (thirty) days. Gets inject at [facility]   DULOXETINE (CYMBALTA) 60 MG CAPSULE    Take 60 mg by mouth daily.   FLUTICASONE-SALMETEROL (ADVAIR HFA) 45-21 MCG/ACT INHALER    Inhale 2 puffs into the lungs every 12 (twelve) hours.    FOLIC ACID (FOLVITE) 1 MG TABLET    Take 1 mg by mouth daily.    FUROSEMIDE (LASIX) 40 MG TABLET    Take 40 mg by mouth daily.   INSULIN ASPART (NOVOLOG) 100 UNIT/ML INJECTION    Inject 5 Units into the skin 3 (three) times daily before meals. For CBG>150   LEVOTHYROXINE (SYNTHROID, LEVOTHROID) 75 MCG TABLET    Take 75 mcg by mouth daily.    METOPROLOL TARTRATE (LOPRESSOR) 25 MG TABLET    Take 25 mg by mouth 2 (two) times daily.   OMEPRAZOLE (PRILOSEC) 20 MG CAPSULE    Take 1 capsule (20 mg total) by mouth daily.   SPIRONOLACTONE (ALDACTONE) 25 MG TABLET    Take 25 mg by mouth daily.   TIOTROPIUM (SPIRIVA) 18 MCG INHALATION CAPSULE     Place 18 mcg into inhaler and inhale daily. 2 puffs daily   VITAMIN B-12 (CYANOCOBALAMIN) 1000 MCG TABLET    Take 1,000 mcg by mouth daily.  Modified Medications   No medications on file  Discontinued Medications   No medications on file     Physical Exam: Physical Exam  Constitutional: He is oriented to person, place, and time. He appears well-developed and well-nourished. No distress.  HENT:  Head: Normocephalic and atraumatic.  Right Ear: External ear normal.  Left Ear: External ear normal.  Nose: Nose normal.  Mouth/Throat: Oropharynx is clear and moist. No oropharyngeal exudate.  Eyes: Conjunctivae and EOM are normal. Pupils are equal, round, and reactive to light.  Neck: Normal range of motion. Neck supple. No JVD present. No thyromegaly present.  Cardiovascular: Normal rate, regular rhythm and normal heart sounds.   No murmur heard. Pulmonary/Chest: Effort normal. No respiratory distress. He has no wheezes. He has rales in the right lower field and the left lower field. He exhibits no tenderness.  Bibasilar as prior.   Abdominal: Soft. Bowel sounds are normal. He exhibits no distension and no mass. There is no tenderness. There is no rebound.  Musculoskeletal: Normal range of motion. He exhibits edema (trace in BLE). He exhibits no tenderness.  Trace edema BLE  Lymphadenopathy:    He has no cervical adenopathy.  Neurological: He is alert and oriented to person, place, and time. He has normal reflexes. No cranial nerve deficit. Coordination normal.  Skin: Skin is warm and dry. No rash noted. He is not diaphoretic. No erythema.  Small open area right buttock.  Psychiatric: Thought content normal. His mood appears anxious. His affect is angry and inappropriate. His affect is not blunt and not labile. His speech is not delayed, not tangential and not slurred. He is agitated and aggressive. He is not hyperactive, not slowed, not withdrawn, not actively hallucinating and not  combative. Cognition and memory are impaired. He exhibits a depressed mood. He is communicative. He exhibits abnormal recent memory. He is attentive.    Filed Vitals:   08/30/14 1403  BP: 132/60  Pulse: 82  Temp: 98.2 F (36.8 C)  TempSrc: Tympanic  Resp: 18   Labs reviewed: Basic Metabolic Panel:  Recent Labs  01/12/14  04/08/14  06/02/14 1042 07/21/14 07/28/14 07/28/14 1035 08/11/14 0926  NA 135*  < > 134*  < >  133* 137  --  135* 134*  K 4.5  < > 4.5  < > 5.5* 4.2  --  5.3* 4.6  CO2  --   < >  --   < > 25  --   --  25 25  GLUCOSE  --   < >  --   < > 137  --   --  185* 154*  BUN 46*  < > 35*  < > 38.9* 24*  --  39.5* 41.3*  CREATININE 1.9*  < > 1.5*  < > 1.7* 1.0  --  1.7* 1.7*  CALCIUM  --   < >  --   < > 9.2  --   --  8.9 8.6  TSH 2.83  --  2.51  --   --   --  2.24  --   --   < > = values in this interval not displayed. Liver Function Tests:  Recent Labs  05/05/14 1227 07/21/14 07/28/14 1035 08/11/14 0926  AST _0 ALT _1 ALKPHOS 97 203* 87 100  BILITOT 0.26  --  0.42 0.37  PROT 6.4  --  6.4 6.8  ALBUMIN 3.1*  --  2.9* 3.3*   CBC:  Recent Labs  07/28/14 1034 08/11/14 0926 08/25/14 08/25/14 0927  WBC 5.8 3.9* 4.4 4.4  NEUTROABS 3.9 2.4  --  2.9  HGB 8.3* 8.1* 7.9* 7.9*  HCT 25.3* 24.3* 24* 23.5*  MCV 112.9* 112.5*  --  115.8*  PLT 91* 97* 86* 86*   Past Procedures:  CXR 06/29/13 showed there is mild patchy atelectasis or interstitial pneumonitis at the right lung base appearing significantly improved.   Assessment/Plan MDS (myelodysplastic syndrome) 06/02/14 Bone marrow biopsy: the overall features favor a myelodysplastic state particularly refractory cytopenia with multi lineage dysplasia.Plan of tx: observation, blood transfusions, Aranesp, and Vidaza SQ(chemo).  07/20/14 Hgb 7.0-f/u oncology.  08/25/14 Hgb 7.9, s/p transfusion    Type 2 diabetes mellitus with diabetic chronic kidney disease Hgb A1c 7.6 12/01/13 04/08/14 Hgb A1c 7.7,  Bun/creat 35/1.47 05/11/14 Hgb 7.1, Bun/creat 39/1.82 08/17/14 Hgb A1c 8.3 08/30/14 increase Lantus to 46u qd qd.     Duodenal ulcer Stable, takes Omeprazole 77m daily, no c/o stomach pain, indigestion, or melena.       Hypothyroidism Corrected with Levothyroxine 773m, TSH 2.55 12/01/13 and 2.83 01/12/14. 07/28/14 TSH 2.243   Depression Stabilized since Cymbalta was increased to 6031m/15/15. 01/11/14 wife was tearful and stated Mr. ParMartinelled at her. She said he has been getting angry easily, throwing objects to staff, and verbally threatening staff.  -stable, continue Cymbalta 94m59mily.      CHF (congestive heart failure) compensated on Furosemide 40mg30m Spironolactone 25mg 65my, trace BLE edema-chronic. CXR 06/29/13 showed no pulmonary vascular congestion.   Dementia No change, off Namenda R>B in setting of bone marrow dysplasia . Continue SNF for care needs     Essential hypertension, benign Controlled on Metoprolol 25mg b46moff Losartan due to CKD. Creatinine 1.5-2.0.     Anemia in chronic kidney disease Darbepoetin Alfa inj [facility]-last 01/13/14 02/10/14 Hgb 9.1 03/10/14 Hgb 8.6 05/05/14 Hematology: anemia of chronic kidney disease and possible myelodysplastic syndrome. Megablastic, hypoliferative anemia. Hgb 9.1. Aranesp 500mcg m9mly. 05/19/14 CT guided bone marrow biopsy and 2 units of packed RBCS.  06/02/14 Hgb 9.4. MDS 07/28/14 Hgb 8.3 08/25/14 Hgb 7.9      Family/ Staff Communication: observe the patient  Goals of Care: SNF  Labs/tests ordered: CBC done 08/25/14 at [facility].

## 2014-08-30 NOTE — Assessment & Plan Note (Signed)
Corrected with Levothyroxine 57mcg, TSH 2.55 12/01/13 and 2.83 01/12/14. 07/28/14 TSH 2.243

## 2014-08-30 NOTE — Assessment & Plan Note (Signed)
Controlled on Metoprolol 25mg  bid. off Losartan due to CKD. Creatinine 1.5-2.0.

## 2014-08-30 NOTE — Assessment & Plan Note (Signed)
Hgb A1c 7.6 12/01/13 04/08/14 Hgb A1c 7.7, Bun/creat 35/1.47 05/11/14 Hgb 7.1, Bun/creat 39/1.82 08/17/14 Hgb A1c 8.3 08/30/14 increase Lantus to 46u qd qd.

## 2014-08-30 NOTE — Assessment & Plan Note (Signed)
Stabilized since Cymbalta was increased to 60mg  01/11/14. 01/11/14 wife was tearful and stated [name] [name] yelled at her. She said he has been getting angry easily, throwing objects to staff, and verbally threatening staff.  -stable, continue Cymbalta 60mg  daily.

## 2014-09-08 ENCOUNTER — Ambulatory Visit (HOSPITAL_BASED_OUTPATIENT_CLINIC_OR_DEPARTMENT_OTHER): Payer: Medicare Other

## 2014-09-08 ENCOUNTER — Other Ambulatory Visit: Payer: Self-pay | Admitting: Nurse Practitioner

## 2014-09-08 DIAGNOSIS — D6489 Other specified anemias: Secondary | ICD-10-CM

## 2014-09-08 DIAGNOSIS — D469 Myelodysplastic syndrome, unspecified: Secondary | ICD-10-CM

## 2014-09-08 DIAGNOSIS — N189 Chronic kidney disease, unspecified: Secondary | ICD-10-CM

## 2014-09-08 DIAGNOSIS — N182 Chronic kidney disease, stage 2 (mild): Secondary | ICD-10-CM

## 2014-09-08 DIAGNOSIS — D631 Anemia in chronic kidney disease: Secondary | ICD-10-CM

## 2014-09-08 DIAGNOSIS — D649 Anemia, unspecified: Secondary | ICD-10-CM

## 2014-09-08 LAB — CBC & DIFF AND RETIC
BASO%: 0 % (ref 0.0–2.0)
BASOS ABS: 0 10*3/uL (ref 0.0–0.1)
EOS ABS: 0.2 10*3/uL (ref 0.0–0.5)
EOS%: 3.6 % (ref 0.0–7.0)
HEMATOCRIT: 26.3 % — AB (ref 38.4–49.9)
HEMOGLOBIN: 9 g/dL — AB (ref 13.0–17.1)
IMMATURE RETIC FRACT: 13.5 % — AB (ref 3.00–10.60)
LYMPH%: 22.9 % (ref 14.0–49.0)
MCH: 39 pg — ABNORMAL HIGH (ref 27.2–33.4)
MCHC: 34.2 g/dL (ref 32.0–36.0)
MCV: 113.9 fL — ABNORMAL HIGH (ref 79.3–98.0)
MONO#: 0.3 10*3/uL (ref 0.1–0.9)
MONO%: 5.6 % (ref 0.0–14.0)
NEUT%: 67.9 % (ref 39.0–75.0)
NEUTROS ABS: 3.1 10*3/uL (ref 1.5–6.5)
Platelets: 90 10*3/uL — ABNORMAL LOW (ref 140–400)
RBC: 2.31 10*6/uL — ABNORMAL LOW (ref 4.20–5.82)
RDW: 22.4 % — ABNORMAL HIGH (ref 11.0–14.6)
RETIC CT ABS: 58.67 10*3/uL (ref 34.80–93.90)
Retic %: 2.54 % — ABNORMAL HIGH (ref 0.80–1.80)
WBC: 4.5 10*3/uL (ref 4.0–10.3)
lymph#: 1 10*3/uL (ref 0.9–3.3)
nRBC: 0 % (ref 0–0)

## 2014-09-08 LAB — IRON AND TIBC CHCC
%SAT: 57 % — ABNORMAL HIGH (ref 20–55)
Iron: 128 ug/dL (ref 42–163)
TIBC: 225 ug/dL (ref 202–409)
UIBC: 98 ug/dL — AB (ref 117–376)

## 2014-09-08 LAB — CBC AND DIFFERENTIAL
HCT: 26 % — AB (ref 41–53)
Hemoglobin: 9 g/dL — AB (ref 13.5–17.5)
Platelets: 90 10*3/uL — AB (ref 150–399)
WBC: 4.5 10*3/mL

## 2014-09-08 LAB — FERRITIN CHCC: Ferritin: 660 ng/ml — ABNORMAL HIGH (ref 22–316)

## 2014-09-08 LAB — HOLD TUBE, BLOOD BANK

## 2014-09-08 MED ORDER — DARBEPOETIN ALFA 500 MCG/ML IJ SOSY
500.0000 ug | PREFILLED_SYRINGE | Freq: Once | INTRAMUSCULAR | Status: AC
  Administered 2014-09-08: 500 ug via SUBCUTANEOUS
  Filled 2014-09-08: qty 1

## 2014-09-10 LAB — ERYTHROPOIETIN: Erythropoietin: 504.8 m[IU]/mL — ABNORMAL HIGH (ref 2.6–18.5)

## 2014-09-13 ENCOUNTER — Telehealth: Payer: Self-pay | Admitting: *Deleted

## 2014-09-13 ENCOUNTER — Other Ambulatory Visit: Payer: Self-pay | Admitting: Hematology and Oncology

## 2014-09-13 ENCOUNTER — Telehealth: Payer: Self-pay | Admitting: Hematology and Oncology

## 2014-09-13 DIAGNOSIS — N183 Chronic kidney disease, stage 3 (moderate): Secondary | ICD-10-CM

## 2014-09-13 NOTE — Telephone Encounter (Signed)
Wife left letter for Dr. Alvy Bimler last week asking questions about her husbands treatment and disease.  Dr. Alvy Bimler requests contact numbers to call wife to discuss her questions.  Called pt and she confirmed her home and cell phone numbers are both current.  Numbers given to Dr. Alvy Bimler.

## 2014-09-13 NOTE — Telephone Encounter (Signed)
I spoke with the patient's daughter, Martrell Eguia about his prognosis. His recent erythropoietin level was high and I felt that Aranesp injection is no longer helpful. I recommend discontinuation of Aranesp injection. I recommend transfusion support only at this point. An alternative would be to consider hospice and palliative care. His daughter will talk to other family members further. I will plan to see him again next week as scheduled. She is also complaining about his healthy diet which is very unpleasant and bland. I explained to her the rationale of low-salt and diabetic diet due to his other comorbidities including congestive heart failure, chronic kidney disease and diabetes.

## 2014-09-20 ENCOUNTER — Encounter: Payer: Self-pay | Admitting: Nurse Practitioner

## 2014-09-20 ENCOUNTER — Non-Acute Institutional Stay (SKILLED_NURSING_FACILITY): Payer: Medicare Other | Admitting: Nurse Practitioner

## 2014-09-20 DIAGNOSIS — D469 Myelodysplastic syndrome, unspecified: Secondary | ICD-10-CM | POA: Diagnosis not present

## 2014-09-20 DIAGNOSIS — K269 Duodenal ulcer, unspecified as acute or chronic, without hemorrhage or perforation: Secondary | ICD-10-CM

## 2014-09-20 DIAGNOSIS — N189 Chronic kidney disease, unspecified: Secondary | ICD-10-CM | POA: Diagnosis not present

## 2014-09-20 DIAGNOSIS — D631 Anemia in chronic kidney disease: Secondary | ICD-10-CM | POA: Diagnosis not present

## 2014-09-20 DIAGNOSIS — E039 Hypothyroidism, unspecified: Secondary | ICD-10-CM | POA: Diagnosis not present

## 2014-09-20 DIAGNOSIS — E1122 Type 2 diabetes mellitus with diabetic chronic kidney disease: Secondary | ICD-10-CM | POA: Diagnosis not present

## 2014-09-20 DIAGNOSIS — F329 Major depressive disorder, single episode, unspecified: Secondary | ICD-10-CM

## 2014-09-20 DIAGNOSIS — F0390 Unspecified dementia without behavioral disturbance: Secondary | ICD-10-CM

## 2014-09-20 DIAGNOSIS — I10 Essential (primary) hypertension: Secondary | ICD-10-CM | POA: Diagnosis not present

## 2014-09-20 DIAGNOSIS — F32A Depression, unspecified: Secondary | ICD-10-CM

## 2014-09-20 DIAGNOSIS — I509 Heart failure, unspecified: Secondary | ICD-10-CM

## 2014-09-20 NOTE — Assessment & Plan Note (Signed)
compensated on Furosemide 40mg  and Spironolactone 25mg  daily, trace BLE edema-chronic. CXR 06/29/13 showed no pulmonary vascular congestion.

## 2014-09-20 NOTE — Assessment & Plan Note (Signed)
Levothyroxine 66mcg, TSH 2.55 12/01/13.  01/12/14 TSH 2.83 07/28/14 TSH 2.243

## 2014-09-20 NOTE — Assessment & Plan Note (Signed)
Stabilized since Cymbalta was increased to 60mg  01/11/14. 01/11/14 wife was tearful and stated [name] [name] yelled at her. She said he has been getting angry easily, throwing objects to staff, and verbally threatening staff.  -stable, continue Cymbalta 60mg  daily.

## 2014-09-20 NOTE — Assessment & Plan Note (Signed)
Stable, takes Omeprazole 20mg  daily, no c/o stomach pain, indigestion, or melena.

## 2014-09-20 NOTE — Assessment & Plan Note (Signed)
Controlled on Metoprolol 25mg  bid. off Losartan due to CKD. Creatinine 1.5-2.0.

## 2014-09-20 NOTE — Assessment & Plan Note (Signed)
06/02/14 Bone marrow biopsy: the overall features favor a myelodysplastic state particularly refractory cytopenia with multi lineage dysplasia.Plan of tx: observation, blood transfusions, Aranesp, and Vidaza SQ(chemo).  07/20/14 Hgb 7.0-f/u oncology.  08/25/14 Hgb 7.9, s/p transfusion

## 2014-09-20 NOTE — Assessment & Plan Note (Signed)
Darbepoetin Alfa inj [facility]-last 01/13/14 02/10/14 Hgb 9.1 03/10/14 Hgb 8.6 05/05/14 Hematology: anemia of chronic kidney disease and possible myelodysplastic syndrome. Megablastic, hypoliferative anemia. Hgb 9.1. Aranesp 515mg monthly. 05/19/14 CT guided bone marrow biopsy and 2 units of packed RBCS.  06/02/14 Hgb 9.4. MDS 07/28/14 Hgb 8.3 08/25/14 Hgb 7.9 09/20/14 update at cancer center-POA agrees with blood transfusion but no longer desires Aranesp inj.

## 2014-09-20 NOTE — Assessment & Plan Note (Signed)
Hgb A1c 7.6 12/01/13 04/08/14 Hgb A1c 7.7, Bun/creat 35/1.47 05/11/14 Hgb 7.1, Bun/creat 39/1.82 08/17/14 Hgb A1c 8.3 08/30/14 increase Lantus to 46u qd qd.  09/06/14 CBG am 240-380-increase Lantus 50u qd qd.  2/22/16y CBG elevated 200s still-Lantus 54 qd Novolog 12u for CBG >450 instead of 5u with meals.

## 2014-09-20 NOTE — Assessment & Plan Note (Signed)
No change, off Namenda R>B in setting of bone marrow dysplasia . Continue SNF for care needs

## 2014-09-20 NOTE — Progress Notes (Signed)
Patient ID: [name] [name], male   DOB: 07/14/1920, 81 y.o.   MRN: [id number]   Code Status: DNR  Allergies  Allergen Reactions  . Enalapril Other (See Comments)    cough  . Metformin And Related Diarrhea  . Milk-Related Compounds Other (See Comments)    Doesn't remember but its a mild reaction    Chief Complaint  Patient presents with  . Medical Management of Chronic Issues  . Acute Visit    blood sugar    HPI: Patient is a 81 y.o. male seen in the SNF at [facility] today for evaluation of blood sugar and chronic medical conditions.  Problem List Items Addressed This Visit    Type 2 diabetes mellitus with diabetic chronic kidney disease - Primary    Hgb A1c 7.6 12/01/13 04/08/14 Hgb A1c 7.7, Bun/creat 35/1.47 05/11/14 Hgb 7.1, Bun/creat 39/1.82 08/17/14 Hgb A1c 8.3 08/30/14 increase Lantus to 46u qd qd.  09/06/14 CBG am 240-380-increase Lantus 50u qd qd.  2/22/16y CBG elevated 200s still-Lantus 54 qd Novolog 12u for CBG >450 instead of 5u with meals.        Relevant Medications   insulin glargine (LANTUS) 100 UNIT/ML injection   MDS (myelodysplastic syndrome)    06/02/14 Bone marrow biopsy: the overall features favor a myelodysplastic state particularly refractory cytopenia with multi lineage dysplasia.Plan of tx: observation, blood transfusions, Aranesp, and Vidaza SQ(chemo).  07/20/14 Hgb 7.0-f/u oncology.  08/25/14 Hgb 7.9, s/p transfusion       Hypothyroidism    Levothyroxine 95mg, TSH 2.55 12/01/13.  01/12/14 TSH 2.83 07/28/14 TSH 2.243       Essential hypertension, benign    Controlled on Metoprolol 282mbid. off Losartan due to CKD. Creatinine 1.5-2.0.        Duodenal ulcer    Stable, takes Omeprazole 2043maily, no c/o stomach pain, indigestion, or melena.         Depression    Stabilized since Cymbalta was increased to 47m25m15/15. 01/11/14 wife was tearful and stated Mr. ParkMoringled at her. She said he has been getting angry easily, throwing  objects to staff, and verbally threatening staff.  -stable, continue Cymbalta 47mg7mly.         Dementia    No change, off Namenda R>B in setting of bone marrow dysplasia . Continue SNF for care needs         CHF (congestive heart failure)    compensated on Furosemide 40mg 50mSpironolactone 25mg d21m, trace BLE edema-chronic. CXR 06/29/13 showed no pulmonary vascular congestion.       Anemia in chronic kidney disease    Darbepoetin Alfa inj [facility]-last 01/13/14 02/10/14 Hgb 9.1 03/10/14 Hgb 8.6 05/05/14 Hematology: anemia of chronic kidney disease and possible myelodysplastic syndrome. Megablastic, hypoliferative anemia. Hgb 9.1. Aranesp 500mcg m47mly. 05/19/14 CT guided bone marrow biopsy and 2 units of packed RBCS.  06/02/14 Hgb 9.4. MDS 07/28/14 Hgb 8.3 08/25/14 Hgb 7.9 09/20/14 update at cancer center-POA agrees with blood transfusion but no longer desires Aranesp inj.            Review of Systems:  Review of Systems  Constitutional: Negative for fever, chills, weight loss, malaise/fatigue and diaphoresis.  HENT: Positive for hearing loss. Negative for congestion, ear discharge, ear pain, nosebleeds, sore throat and tinnitus.   Eyes: Negative for blurred vision, double vision, photophobia, pain, discharge and redness.  Respiratory: Positive for cough. Negative for hemoptysis, sputum production, shortness of breath, wheezing and stridor.  Cardiovascular: Positive for leg swelling and PND. Negative for chest pain, palpitations, orthopnea and claudication.  Gastrointestinal: Negative for heartburn, nausea, vomiting, abdominal pain, diarrhea, constipation, blood in stool and melena.  Genitourinary: Positive for frequency. Negative for dysuria, urgency, hematuria and flank pain.  Musculoskeletal: Positive for joint pain. Negative for myalgias, back pain, falls and neck pain.  Skin: Negative for itching and rash.  Neurological: Positive for sensory change.  Negative for dizziness, tingling, tremors, speech change, seizures, weakness and headaches.  Endo/Heme/Allergies: Negative for environmental allergies and polydipsia. Does not bruise/bleed easily.  Psychiatric/Behavioral: Positive for depression and memory loss. Negative for suicidal ideas, hallucinations and substance abuse. The patient is nervous/anxious. The patient does not have insomnia.      Past Medical History  Diagnosis Date  . Hypothyroidism   . Hypertension   . CHF (congestive heart failure)   . Diabetes mellitus   . Coronary artery disease   . Restless leg syndrome   . Hyperlipemia   . Renal disease     stage II  . Sleep apnea   . Cellulitis and abscess of leg   . Obesity   . Duodenal ulcer, perforated   . Diverticulosis   . Internal hemorrhoids   . CAD (coronary artery disease)   . Anemia   . Duodenal ulcer   . Allergy   . Blood transfusion   . Cataract   . Tuberculosis     as a child  . Contact dermatitis and other eczema, due to unspecified cause 10/06/2012  . Senile dementia with depressive features 05/26/2012  . Hyposmolality and/or hyponatremia 04/02/2012  . Diarrhea 03/26/2012  . Hemorrhage of rectum and anus 03/25/2012  . Pneumonia, organism unspecified 03/12/2012  . 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 Folate-deficiency anemia 01/07/2012  . Thrombocytopenia, unspecified 09/26/2011  . Loss of weight 09/11/2011  . Dysphagia, unspecified(787.20) 06/28/2011  . Chronic kidney disease, stage III (moderate) 03/22/2011  . Cramp of limb 08/10/2010  . Chronic kidney disease, stage II (mild) 03/22/2011  . Personal history of fall 05/05/2009  . Unspecified vitamin D deficiency 02/24/2009  . Blood in stool 11/18/2008  . Dermatophytosis of the body 06/23/2007  . Obesity, unspecified 06/23/2007  . Unspecified tinnitus 06/23/2007  . Edema 06/23/2007  . Shortness of breath 06/23/2007  . Flatulence, eructation, and gas pain  06/23/2007  . Other general symptoms(780.99) 06/23/2007  . Memory loss 08/13/2003  . Screening PSA (prostate specific antigen) 07/27/2014  . Prostate cancer screening 07/27/2014  . Weight loss 07/27/2014   Past Surgical History  Procedure Laterality Date  . Laparotomy  09/12/2011    Procedure: EXPLORATORY LAPAROTOMY;  Surgeon: Judieth Keens, DO;  Location: [facility];  Service: General;  Laterality: N/A;  REPAIR PERFORATED ULCER  . Gastrostomy  09/12/2011    Procedure: GASTROSTOMY;  Surgeon: Judieth Keens, DO;  Location: [facility];  Service: General;  Laterality: N/A;  BIOPSY DUODENAL ULCER  . Shoulder surgery  [place], [place]  . Jejunostomy feeding tube      removed  . Back surgery  1942  . Eye surgery  [phone]    cataracts  . Coronary artery bypass graft  2201   Social History:   reports that he has quit smoking. His smoking use included Cigarettes. He has never used smokeless tobacco. He reports that he does not drink alcohol or use illicit drugs.  Family History  Problem Relation Age of Onset  . Stroke Father  Medications: Patient's Medications  New Prescriptions   No medications on file  Previous Medications   ACETAMINOPHEN (TYLENOL) 325 MG TABLET    Take 650 mg by mouth every 4 (four) hours as needed (for pain).    DARBEPOETIN ALFA-POLYSORBATE (ARANESP, ALBUMIN FREE,) 500 MCG/ML INJECTION    Inject 500 mcg into the skin every 30 (thirty) days. Gets inject at [facility]   DULOXETINE (CYMBALTA) 60 MG CAPSULE    Take 60 mg by mouth daily.   FLUTICASONE-SALMETEROL (ADVAIR HFA) 45-21 MCG/ACT INHALER    Inhale 2 puffs into the lungs every 12 (twelve) hours.    FOLIC ACID (FOLVITE) 1 MG TABLET    Take 1 mg by mouth daily.    FUROSEMIDE (LASIX) 40 MG TABLET    Take 40 mg by mouth daily.   INSULIN ASPART (NOVOLOG) 100 UNIT/ML INJECTION    Inject 5 Units into the skin 3 (three) times daily before meals. For CBG>150   INSULIN GLARGINE (LANTUS) 100 UNIT/ML INJECTION     Inject 54 Units into the skin at bedtime.   LEVOTHYROXINE (SYNTHROID, LEVOTHROID) 75 MCG TABLET    Take 75 mcg by mouth daily.    METOPROLOL TARTRATE (LOPRESSOR) 25 MG TABLET    Take 25 mg by mouth 2 (two) times daily.   OMEPRAZOLE (PRILOSEC) 20 MG CAPSULE    Take 1 capsule (20 mg total) by mouth daily.   SPIRONOLACTONE (ALDACTONE) 25 MG TABLET    Take 25 mg by mouth daily.   TIOTROPIUM (SPIRIVA) 18 MCG INHALATION CAPSULE    Place 18 mcg into inhaler and inhale daily. 2 puffs daily   VITAMIN B-12 (CYANOCOBALAMIN) 1000 MCG TABLET    Take 1,000 mcg by mouth daily.  Modified Medications   No medications on file  Discontinued Medications   No medications on file     Physical Exam: Physical Exam  Constitutional: He is oriented to person, place, and time. He appears well-developed and well-nourished. No distress.  HENT:  Head: Normocephalic and atraumatic.  Right Ear: External ear normal.  Left Ear: External ear normal.  Nose: Nose normal.  Mouth/Throat: Oropharynx is clear and moist. No oropharyngeal exudate.  Eyes: Conjunctivae and EOM are normal. Pupils are equal, round, and reactive to light.  Neck: Normal range of motion. Neck supple. No JVD present. No thyromegaly present.  Cardiovascular: Normal rate, regular rhythm and normal heart sounds.   No murmur heard. Pulmonary/Chest: Effort normal. No respiratory distress. He has no wheezes. He has rales in the right lower field and the left lower field. He exhibits no tenderness.  Bibasilar as prior.   Abdominal: Soft. Bowel sounds are normal. He exhibits no distension and no mass. There is no tenderness. There is no rebound.  Musculoskeletal: Normal range of motion. He exhibits edema (trace in BLE). He exhibits no tenderness.  Trace edema BLE  Lymphadenopathy:    He has no cervical adenopathy.  Neurological: He is alert and oriented to person, place, and time. He has normal reflexes. No cranial nerve deficit. Coordination normal.  Skin:  Skin is warm and dry. No rash noted. He is not diaphoretic. No erythema.  Small open area right buttock.  Psychiatric: Thought content normal. His mood appears anxious. His affect is angry and inappropriate. His affect is not blunt and not labile. His speech is not delayed, not tangential and not slurred. He is agitated and aggressive. He is not hyperactive, not slowed, not withdrawn, not actively hallucinating and not combative. Cognition and memory are  impaired. He exhibits a depressed mood. He is communicative. He exhibits abnormal recent memory. He is attentive.    Filed Vitals:   09/20/14 1235  BP: 120/66  Pulse: 61  Resp: 18   Labs reviewed: Basic Metabolic Panel:  Recent Labs  01/12/14  04/08/14  06/02/14 1042 07/21/14 07/28/14 07/28/14 1035 08/11/14 0926  NA 135*  < > 134*  < > 133* 137  --  135* 134*  K 4.5  < > 4.5  < > 5.5* 4.2  --  5.3* 4.6  CO2  --   < >  --   < > 25  --   --  25 25  GLUCOSE  --   < >  --   < > 137  --   --  185* 154*  BUN 46*  < > 35*  < > 38.9* 24*  --  39.5* 41.3*  CREATININE 1.9*  < > 1.5*  < > 1.7* 1.0  --  1.7* 1.7*  CALCIUM  --   < >  --   < > 9.2  --   --  8.9 8.6  TSH 2.83  --  2.51  --   --   --  2.24  --   --   < > = values in this interval not displayed. Liver Function Tests:  Recent Labs  05/05/14 1227 07/21/14 07/28/14 1035 08/11/14 0926  AST _0 ALT _1 ALKPHOS 97 203* 87 100  BILITOT 0.26  --  0.42 0.37  PROT 6.4  --  6.4 6.8  ALBUMIN 3.1*  --  2.9* 3.3*   CBC:  Recent Labs  08/11/14 0926  08/25/14 0927 09/08/14 09/08/14 0920  WBC 3.9*  < > 4.4 4.5 4.5  NEUTROABS 2.4  --  2.9  --  3.1  HGB 8.1*  < > 7.9* 9.0* 9.0*  HCT 24.3*  < > 23.5* 26* 26.3*  MCV 112.5*  --  115.8*  --  113.9*  PLT 97*  < > 86* 90* 90*  < > = values in this interval not displayed. Past Procedures:  CXR 06/29/13 showed there is mild patchy atelectasis or interstitial pneumonitis at the right lung base appearing significantly  improved.   Assessment/Plan Type 2 diabetes mellitus with diabetic chronic kidney disease Hgb A1c 7.6 12/01/13 04/08/14 Hgb A1c 7.7, Bun/creat 35/1.47 05/11/14 Hgb 7.1, Bun/creat 39/1.82 08/17/14 Hgb A1c 8.3 08/30/14 increase Lantus to 46u qd qd.  09/06/14 CBG am 240-380-increase Lantus 50u qd qd.  2/22/16y CBG elevated 200s still-Lantus 54 qd Novolog 12u for CBG >450 instead of 5u with meals.     Anemia in chronic kidney disease Darbepoetin Alfa inj [facility]-last 01/13/14 02/10/14 Hgb 9.1 03/10/14 Hgb 8.6 05/05/14 Hematology: anemia of chronic kidney disease and possible myelodysplastic syndrome. Megablastic, hypoliferative anemia. Hgb 9.1. Aranesp 528mg monthly. 05/19/14 CT guided bone marrow biopsy and 2 units of packed RBCS.  06/02/14 Hgb 9.4. MDS 07/28/14 Hgb 8.3 08/25/14 Hgb 7.9 09/20/14 update at cancer center-POA agrees with blood transfusion but no longer desires Aranesp inj.      CHF (congestive heart failure) compensated on Furosemide 475mand Spironolactone 2561maily, trace BLE edema-chronic. CXR 06/29/13 showed no pulmonary vascular congestion.    Dementia No change, off Namenda R>B in setting of bone marrow dysplasia . Continue SNF for care needs      Depression Stabilized since Cymbalta was increased to 70m16m15/15. 01/11/14 wife was tearful  and stated Mr. Orsak yelled at her. She said he has been getting angry easily, throwing objects to staff, and verbally threatening staff.  -stable, continue Cymbalta 52m daily.      Duodenal ulcer Stable, takes Omeprazole 254mdaily, no c/o stomach pain, indigestion, or melena.      Essential hypertension, benign Controlled on Metoprolol 2538mid. off Losartan due to CKD. Creatinine 1.5-2.0.     Hypothyroidism Levothyroxine 48m2mTSH 2.55 12/01/13.  01/12/14 TSH 2.83 07/28/14 TSH 2.243    MDS (myelodysplastic syndrome) 06/02/14 Bone marrow biopsy: the overall features favor a myelodysplastic state  particularly refractory cytopenia with multi lineage dysplasia.Plan of tx: observation, blood transfusions, Aranesp, and Vidaza SQ(chemo).  07/20/14 Hgb 7.0-f/u oncology.  08/25/14 Hgb 7.9, s/p transfusion      Family/ Staff Communication: observe the patient  Goals of Care: SNF  Labs/tests ordered: none

## 2014-09-22 ENCOUNTER — Encounter: Payer: Self-pay | Admitting: Hematology and Oncology

## 2014-09-22 ENCOUNTER — Telehealth: Payer: Self-pay | Admitting: *Deleted

## 2014-09-22 ENCOUNTER — Telehealth: Payer: Self-pay | Admitting: Hematology and Oncology

## 2014-09-22 ENCOUNTER — Ambulatory Visit: Payer: Medicare Other

## 2014-09-22 ENCOUNTER — Other Ambulatory Visit (HOSPITAL_BASED_OUTPATIENT_CLINIC_OR_DEPARTMENT_OTHER): Payer: Medicare Other

## 2014-09-22 ENCOUNTER — Ambulatory Visit (HOSPITAL_BASED_OUTPATIENT_CLINIC_OR_DEPARTMENT_OTHER): Payer: Medicare Other | Admitting: Hematology and Oncology

## 2014-09-22 VITALS — BP 91/44 | HR 87 | Temp 97.4°F | Resp 18 | Ht 68.0 in | Wt 228.5 lb

## 2014-09-22 DIAGNOSIS — D696 Thrombocytopenia, unspecified: Secondary | ICD-10-CM

## 2014-09-22 DIAGNOSIS — D469 Myelodysplastic syndrome, unspecified: Secondary | ICD-10-CM

## 2014-09-22 DIAGNOSIS — F0390 Unspecified dementia without behavioral disturbance: Secondary | ICD-10-CM

## 2014-09-22 DIAGNOSIS — N184 Chronic kidney disease, stage 4 (severe): Secondary | ICD-10-CM

## 2014-09-22 DIAGNOSIS — N189 Chronic kidney disease, unspecified: Secondary | ICD-10-CM

## 2014-09-22 DIAGNOSIS — N183 Chronic kidney disease, stage 3 (moderate): Secondary | ICD-10-CM

## 2014-09-22 DIAGNOSIS — E875 Hyperkalemia: Secondary | ICD-10-CM

## 2014-09-22 LAB — COMPREHENSIVE METABOLIC PANEL (CC13)
ALBUMIN: 3.1 g/dL — AB (ref 3.5–5.0)
ALT: 11 U/L (ref 0–55)
ANION GAP: 9 meq/L (ref 3–11)
AST: 10 U/L (ref 5–34)
Alkaline Phosphatase: 109 U/L (ref 40–150)
BUN: 34.4 mg/dL — ABNORMAL HIGH (ref 7.0–26.0)
CO2: 25 meq/L (ref 22–29)
Calcium: 9 mg/dL (ref 8.4–10.4)
Chloride: 101 mEq/L (ref 98–109)
Creatinine: 1.7 mg/dL — ABNORMAL HIGH (ref 0.7–1.3)
EGFR: 35 mL/min/{1.73_m2} — ABNORMAL LOW (ref >90)
Glucose: 210 mg/dl — ABNORMAL HIGH (ref 70–140)
POTASSIUM: 5.6 meq/L — AB (ref 3.5–5.1)
SODIUM: 135 meq/L — AB (ref 136–145)
TOTAL PROTEIN: 6.2 g/dL — AB (ref 6.4–8.3)
Total Bilirubin: 0.39 mg/dL (ref 0.20–1.20)

## 2014-09-22 LAB — CBC & DIFF AND RETIC
BASO%: 0.2 % (ref 0.0–2.0)
BASOS ABS: 0 10*3/uL (ref 0.0–0.1)
EOS%: 0.5 % (ref 0.0–7.0)
Eosinophils Absolute: 0 10*3/uL (ref 0.0–0.5)
HEMATOCRIT: 24.5 % — AB (ref 38.4–49.9)
HGB: 8.4 g/dL — ABNORMAL LOW (ref 13.0–17.1)
Immature Retic Fract: 19.2 % — ABNORMAL HIGH (ref 3.00–10.60)
LYMPH%: 25.7 % (ref 14.0–49.0)
MCH: 38.9 pg — ABNORMAL HIGH (ref 27.2–33.4)
MCHC: 34.3 g/dL (ref 32.0–36.0)
MCV: 113.4 fL — ABNORMAL HIGH (ref 79.3–98.0)
MONO#: 0.2 10*3/uL (ref 0.1–0.9)
MONO%: 4.7 % (ref 0.0–14.0)
NEUT%: 68.9 % (ref 39.0–75.0)
NEUTROS ABS: 3.1 10*3/uL (ref 1.5–6.5)
Platelets: 92 10*3/uL — ABNORMAL LOW (ref 140–400)
RBC: 2.16 10*6/uL — ABNORMAL LOW (ref 4.20–5.82)
RDW: 21.9 % — AB (ref 11.0–14.6)
RETIC %: 2.57 % — AB (ref 0.80–1.80)
RETIC CT ABS: 55.51 10*3/uL (ref 34.80–93.90)
WBC: 4.4 10*3/uL (ref 4.0–10.3)
lymph#: 1.1 10*3/uL (ref 0.9–3.3)
nRBC: 0 % (ref 0–0)

## 2014-09-22 LAB — HOLD TUBE, BLOOD BANK

## 2014-09-22 NOTE — Telephone Encounter (Signed)
Per staff message and POF I have scheduled appts. Advised scheduler of appts. [name]  

## 2014-09-22 NOTE — Telephone Encounter (Signed)
Call received from Daughter Jaymond Waage [phone]) asking to speak with Dr. Alvy Bimler.  "He was seen today and we need to know how his disease and other medical conditions will progress with him not getting injections.  How is a blood transfusion going to help?  Does it make sense to continue treatment?  What is the benefit?  [facility]."  Will notify Fr. Alvy Bimler of this call.

## 2014-09-22 NOTE — Telephone Encounter (Signed)
Pt confirmed labs/ov per 02/24 POF, gave pt AVS.... KJ, sent msg add 5 hour blood.Marland KitchenMarland KitchenMarland Kitchen

## 2014-09-23 ENCOUNTER — Telehealth: Payer: Self-pay | Admitting: Hematology and Oncology

## 2014-09-23 NOTE — Progress Notes (Signed)
[place] OFFICE PROGRESS NOTE  Patient Care Team: Estill Dooms, MD as PCP - General (Internal Medicine) Man Mast X, NP as Nurse Practitioner (Nurse Practitioner)  SUMMARY OF ONCOLOGIC HISTORY:  Myelodysplastic syndrome, on erythropoietin stimulating agent  HISTORY OF PRESENTING ILLNESS:  [name] [name] 81 y.o. male was transferred to my care after his prior physician has left.  I reviewed the patient's records extensive and collaborated the history with the patient. Summary of his history is as follows: He was originally referred here for evaluation of chronic anemia and thrombocytopenia and evaluated by Dr Juliann Mule on 08/26/2013. He has dementia and provides limited history. Of note, he has Chronic kidney disease with a creatinine clearance of 30 ml/min and he resides at SNF at [facility]. As previously reported, his recent anemia work-up revealed a Hgb of 8.3 on 09/02/2012; Normal iron 87, B12 740, Folate > 20. He has not been on iron supplementation. His hgb on 08/13/13 revealed a hgb of 8.0. GI (Dr. Norberto Sorenson T. Dagoberto Ligas. MD) was consulted on 08/26/2013 for anemia. Last upper endoscopy on 12/13/2011 revealed mild gastritis with recommendations for a PPI q am long term. The patient's review of systems for symptoms of anemia was negative.Examining the plt trend revealed his thrombocytopenia started around 09/12/2011 following laparotomy by Dr. Lilyan Punt and repair of perforated ulcer. He has since been on nexium 40 mg bid. Of note he takes namenda for his dementia; lasix 20 mg daily for congestive heart failure; lantus for his diabetes. He is on folic acid daily. He had bone marrow aspirate and biopsy which confirmed myelodysplastic syndrome.   The patient is receiving erythropoietin stimulating agents for severe anemia. The frequency and dose of the injections were increased due to lack of response. He has also been receiving intermittent blood transfusion. The patient  denies any recent signs or symptoms of bleeding such as spontaneous epistaxis, hematuria or hematochezia. There were no recent reported history of infection. He has chronic bilateral leg edema. REVIEW OF SYSTEMS:   Constitutional: Denies fevers, chills or abnormal weight loss Eyes: Denies blurriness of vision Ears, nose, mouth, throat, and face: Denies mucositis or sore throat Respiratory: Denies cough, dyspnea or wheezes Cardiovascular: Denies palpitation, chest discomfort  Gastrointestinal:  Denies nausea, heartburn or change in bowel habits Skin: Denies abnormal skin rashes Lymphatics: Denies new lymphadenopathy or easy bruising Neurological:Denies numbness, tingling or new weaknesses Behavioral/Psych: Mood is stable, no new changes  All other systems were reviewed with the patient and are negative.  I have reviewed the past medical history, past surgical history, social history and family history with the patient and they are unchanged from previous note.  ALLERGIES:  is allergic to enalapril; metformin and related; and milk-related compounds.  MEDICATIONS:  Current Outpatient Prescriptions  Medication Sig Dispense Refill  . acetaminophen (TYLENOL) 325 MG tablet Take 650 mg by mouth every 4 (four) hours as needed (for pain).     . darbepoetin alfa-polysorbate (ARANESP, ALBUMIN FREE,) 500 MCG/ML injection Inject 500 mcg into the skin every 30 (thirty) days. Gets inject at [facility]    . DULoxetine (CYMBALTA) 60 MG capsule Take 60 mg by mouth daily.    . fluticasone-salmeterol (ADVAIR HFA) 45-21 MCG/ACT inhaler Inhale 2 puffs into the lungs every 12 (twelve) hours.     . folic acid (FOLVITE) 1 MG tablet Take 1 mg by mouth daily.     . furosemide (LASIX) 40 MG tablet Take 40 mg by mouth daily.    Marland Kitchen  insulin aspart (NOVOLOG) 100 UNIT/ML injection Inject 5 Units into the skin 3 (three) times daily before meals. For CBG>150    . insulin glargine (LANTUS) 100 UNIT/ML injection Inject 54 Units  into the skin at bedtime.    Marland Kitchen levothyroxine (SYNTHROID, LEVOTHROID) 75 MCG tablet Take 75 mcg by mouth daily.     . metoprolol tartrate (LOPRESSOR) 25 MG tablet Take 25 mg by mouth 2 (two) times daily.    Marland Kitchen omeprazole (PRILOSEC) 20 MG capsule Take 1 capsule (20 mg total) by mouth daily. 30 capsule 3  . spironolactone (ALDACTONE) 25 MG tablet Take 25 mg by mouth daily.    Marland Kitchen tiotropium (SPIRIVA) 18 MCG inhalation capsule Place 18 mcg into inhaler and inhale daily. 2 puffs daily    . vitamin B-12 (CYANOCOBALAMIN) 1000 MCG tablet Take 1,000 mcg by mouth daily.     No current facility-administered medications for this visit.   Facility-Administered Medications Ordered in Other Visits  Medication Dose Route Frequency Provider Last Rate Last Dose  . furosemide (LASIX) injection 20 mg  20 mg Intravenous Once Aasim Marla Roe, MD   20 mg at 05/07/14 1352    PHYSICAL EXAMINATION: ECOG PERFORMANCE STATUS: 2 - Symptomatic, <50% confined to bed  Filed Vitals:   09/22/14 0928  BP: 91/44  Pulse: 87  Temp: 97.4 F (36.3 C)  Resp: 18   Filed Weights   09/22/14 0928  Weight: 228 lb 8 oz (103.647 kg)    GENERAL:alert, no distress and comfortable. He works elderly, sitting on a chair SKIN: skin color, texture, turgor are normal, no rashes or significant lesions EYES: normal, Conjunctiva are pale and non-injected, sclera clear OROPHARYNX:no exudate, no erythema and lips, buccal mucosa, and tongue normal   HEART: Bilateral lower extremity edema ABDOMEN:abdomen soft, non-tender and normal bowel sounds Musculoskeletal:no cyanosis of digits and no clubbing  NEURO: alert & oriented x 3 with fluent speech, no focal motor/sensory deficits  LABORATORY DATA:  I have reviewed the data as listed    Component Value Date/Time   NA 135* 09/22/2014 0905   NA 137 07/21/2014   NA 136 09/21/2011 0400   K 5.6* 09/22/2014 0905   K 4.2 07/21/2014   CL 99 09/21/2011 0400   CO2 25 09/22/2014 0905   CO2  30 09/21/2011 0400   GLUCOSE 210* 09/22/2014 0905   GLUCOSE 229* 09/21/2011 0400   BUN 34.4* 09/22/2014 0905   BUN 24* 07/21/2014   BUN 26* 09/21/2011 0400   CREATININE 1.7* 09/22/2014 0905   CREATININE 1.0 07/21/2014   CREATININE 1.37* 09/21/2011 0400   CALCIUM 9.0 09/22/2014 0905   CALCIUM 7.8* 09/21/2011 0400   PROT 6.2* 09/22/2014 0905   PROT 5.4* 09/13/2011 0345   ALBUMIN 3.1* 09/22/2014 0905   ALBUMIN 2.2* 09/13/2011 0345   AST 10 09/22/2014 0905   AST 23 07/21/2014   ALT 11 09/22/2014 0905   ALT 26 07/21/2014   ALKPHOS 109 09/22/2014 0905   ALKPHOS 203* 07/21/2014   BILITOT 0.39 09/22/2014 0905   BILITOT 0.3 09/13/2011 0345   GFRNONAA 44* 09/21/2011 0400   GFRAA 51* 09/21/2011 0400    No results found for: SPEP, UPEP  Lab Results  Component Value Date   WBC 4.4 09/22/2014   NEUTROABS 3.1 09/22/2014   HGB 8.4* 09/22/2014   HCT 24.5* 09/22/2014   MCV 113.4* 09/22/2014   PLT 92* 09/22/2014      Chemistry      Component Value Date/Time   NA 135*  09/22/2014 0905   NA 137 07/21/2014   NA 136 09/21/2011 0400   K 5.6* 09/22/2014 0905   K 4.2 07/21/2014   CL 99 09/21/2011 0400   CO2 25 09/22/2014 0905   CO2 30 09/21/2011 0400   BUN 34.4* 09/22/2014 0905   BUN 24* 07/21/2014   BUN 26* 09/21/2011 0400   CREATININE 1.7* 09/22/2014 0905   CREATININE 1.0 07/21/2014   CREATININE 1.37* 09/21/2011 0400   GLU 179 07/21/2014      Component Value Date/Time   CALCIUM 9.0 09/22/2014 0905   CALCIUM 7.8* 09/21/2011 0400   ALKPHOS 109 09/22/2014 0905   ALKPHOS 203* 07/21/2014   AST 10 09/22/2014 0905   AST 23 07/21/2014   ALT 11 09/22/2014 0905   ALT 26 07/21/2014   BILITOT 0.39 09/22/2014 0905   BILITOT 0.3 09/13/2011 0345      ASSESSMENT & PLAN:  MDS (myelodysplastic syndrome) The patient is not on any form of systemic treatment due to his age and comorbidities. Despite increasing the frequency of Aranesp injection he has lack of response to anemia. I will  discontinue the injection today. I have reviewed his case with his daughter the patient and family in great details. He is in agreement to come in for palliative blood transfusion only. We also discussed palliative care and hospice. The present time, the patient is interested to continue palliative blood transfusion. I have given him let her instructing nursing home to check his blood every other week. We will give him 1 unit of blood whenever his hemoglobin dropped to less than 8 g. I discussed with the patient and family regarding prognosis. The patient has already outlived life expectancy. With multiple artery comorbidities, it would be impossible sometimes too difficult estimate on overall survival. Blood transfusion alone, I estimate that the patient may live for another 6-12 months. Without blood transfusion, I estimated he may have less than 6 months to live.   CKD (chronic kidney disease) He has onset of acute on chronic kidney disease. I recommend close monitoring with his nursing home primary care provider.   Hyperkalemia This is due to mild acute on chronic renal failure. The nursing home is notified of this test result.   Thrombocytopenia The cause is related to his myelodysplastic syndrome. It is mild and there is little change compared from previous platelet count. The patient denies recent history of bleeding such as epistaxis, hematuria or hematochezia. He is asymptomatic from the thrombocytopenia. I will observe for now.  he does not require transfusion now.        Dementia This is currently stable and he is receiving medications for this.    No orders of the defined types were placed in this encounter.   All questions were answered. The patient knows to call the clinic with any problems, questions or concerns. No barriers to learning was detected. I spent 25 minutes counseling the patient face to face. The total time spent in the appointment was 30 minutes and  more than 50% was on counseling and review of test results     [facility], [place], MD 09/23/2014 4:25 PM

## 2014-09-23 NOTE — Assessment & Plan Note (Signed)
The patient is not on any form of systemic treatment due to his age and comorbidities. Despite increasing the frequency of Aranesp injection he has lack of response to anemia. I will discontinue the injection today. I have reviewed his case with his daughter the patient and family in great details. He is in agreement to come in for palliative blood transfusion only. We also discussed palliative care and hospice. The present time, the patient is interested to continue palliative blood transfusion. I have given him let her instructing nursing home to check his blood every other week. We will give him 1 unit of blood whenever his hemoglobin dropped to less than 8 g. I discussed with the patient and family regarding prognosis. The patient has already outlived life expectancy. With multiple artery comorbidities, it would be impossible sometimes too difficult estimate on overall survival. Blood transfusion alone, I estimate that the patient may live for another 6-12 months. Without blood transfusion, I estimated he may have less than 6 months to live.

## 2014-09-23 NOTE — Assessment & Plan Note (Signed)
This is due to mild acute on chronic renal failure. The nursing home is notified of this test result.

## 2014-09-23 NOTE — Assessment & Plan Note (Signed)
He has onset of acute on chronic kidney disease. I recommend close monitoring with his nursing home primary care provider.

## 2014-09-23 NOTE — Assessment & Plan Note (Signed)
The cause is related to his myelodysplastic syndrome. It is mild and there is little change compared from previous platelet count. The patient denies recent history of bleeding such as epistaxis, hematuria or hematochezia. He is asymptomatic from the thrombocytopenia. I will observe for now.  he does not require transfusion now.

## 2014-09-23 NOTE — Assessment & Plan Note (Signed)
This is currently stable and he is receiving medications for this.

## 2014-09-23 NOTE — Telephone Encounter (Signed)
Lattie Cervi [phone]) I attempted to call the patient's daughter multiple times to discuss his case. I left her multiple voicemail. I will call her back tomorrow.

## 2014-09-24 ENCOUNTER — Telehealth: Payer: Self-pay | Admitting: Hematology and Oncology

## 2014-09-24 NOTE — Telephone Encounter (Signed)
I spoke with the patient's daughter and addressed all her concerns.

## 2014-09-30 LAB — CBC AND DIFFERENTIAL
HCT: 22 % — AB (ref 41–53)
HEMOGLOBIN: 7.4 g/dL — AB (ref 13.5–17.5)
Platelets: 83 10*3/uL — AB (ref 150–399)
WBC: 4.3 10^3/mL

## 2014-10-01 ENCOUNTER — Telehealth: Payer: Self-pay | Admitting: *Deleted

## 2014-10-01 ENCOUNTER — Ambulatory Visit (HOSPITAL_COMMUNITY)
Admission: RE | Admit: 2014-10-01 | Discharge: 2014-10-01 | Disposition: A | Discharge status: Home / Self Care | Payer: Medicare Other | Source: Ambulatory Visit | Attending: Hematology | Admitting: Hematology

## 2014-10-01 ENCOUNTER — Other Ambulatory Visit: Payer: Self-pay | Admitting: Hematology and Oncology

## 2014-10-01 ENCOUNTER — Encounter: Payer: Self-pay | Admitting: Hematology and Oncology

## 2014-10-01 DIAGNOSIS — D469 Myelodysplastic syndrome, unspecified: Secondary | ICD-10-CM

## 2014-10-01 DIAGNOSIS — D638 Anemia in other chronic diseases classified elsewhere: Secondary | ICD-10-CM

## 2014-10-01 NOTE — Telephone Encounter (Signed)
Nurse Malachy Mood at [facility] ([facility]) left VM stating wife wants pt to have transfusion and they will arrange transportation for Monday morning at 8 am.   Then I received a Vm from pt's wife stating that [facility] is "broken down" and they do not have any transportation for Monday.   I called [facility] back and s/w [place].  She says wife is going to try to find some other transportation for pt.  Do not cancel appt.   They will let us know on Monday if pt cannot make it due to transportation issues.  Informed Malachy Mood that our infusion room schedule is full for this coming week and to please try very hard to get transportation for Monday morning since there is an opening at that time.   She verbalized understanding.

## 2014-10-01 NOTE — Telephone Encounter (Signed)
CBC results received from [facility] and Hgb is 7.4, collected yesterday on 3/3.   Per Dr. Alvy Bimler pt needs one unit blood transfusion.  S/w Jac Canavan, RN and she gave appt for pt to come Monday morning 3/7 at 8 am for Lab (type and cross) followed by Transfusion appt for one unit.  There are no other times available at this time.  Called pt's wife and left her a VM on her home and cell phone number.  Informed her of appt and asked her to call nurse back to confirm.  [place] at phone [phone] and informed nurse of appt..  She will ask pts wife if he wants transfusion and will call us back.

## 2014-10-04 ENCOUNTER — Other Ambulatory Visit (HOSPITAL_BASED_OUTPATIENT_CLINIC_OR_DEPARTMENT_OTHER): Payer: Medicare Other

## 2014-10-04 ENCOUNTER — Ambulatory Visit (HOSPITAL_BASED_OUTPATIENT_CLINIC_OR_DEPARTMENT_OTHER): Payer: Medicare Other

## 2014-10-04 ENCOUNTER — Encounter: Payer: Self-pay | Admitting: General Practice

## 2014-10-04 ENCOUNTER — Telehealth: Payer: Self-pay | Admitting: *Deleted

## 2014-10-04 ENCOUNTER — Other Ambulatory Visit: Payer: Self-pay | Admitting: Nurse Practitioner

## 2014-10-04 VITALS — BP 139/55 | HR 84 | Temp 98.2°F | Resp 18

## 2014-10-04 DIAGNOSIS — N189 Chronic kidney disease, unspecified: Secondary | ICD-10-CM

## 2014-10-04 DIAGNOSIS — D469 Myelodysplastic syndrome, unspecified: Secondary | ICD-10-CM | POA: Diagnosis present

## 2014-10-04 DIAGNOSIS — D638 Anemia in other chronic diseases classified elsewhere: Secondary | ICD-10-CM | POA: Diagnosis not present

## 2014-10-04 LAB — CBC & DIFF AND RETIC
BASO%: 0.2 % (ref 0.0–2.0)
Basophils Absolute: 0 10*3/uL (ref 0.0–0.1)
EOS%: 2.9 % (ref 0.0–7.0)
Eosinophils Absolute: 0.1 10*3/uL (ref 0.0–0.5)
HCT: 23.5 % — ABNORMAL LOW (ref 38.4–49.9)
HGB: 7.9 g/dL — ABNORMAL LOW (ref 13.0–17.1)
Immature Retic Fract: 12.8 % — ABNORMAL HIGH (ref 3.00–10.60)
LYMPH%: 22.9 % (ref 14.0–49.0)
MCH: 38.9 pg — AB (ref 27.2–33.4)
MCHC: 33.6 g/dL (ref 32.0–36.0)
MCV: 115.8 fL — AB (ref 79.3–98.0)
MONO#: 0.2 10*3/uL (ref 0.1–0.9)
MONO%: 5.1 % (ref 0.0–14.0)
NEUT#: 3.1 10*3/uL (ref 1.5–6.5)
NEUT%: 68.9 % (ref 39.0–75.0)
Platelets: 93 10*3/uL — ABNORMAL LOW (ref 140–400)
RBC: 2.03 10*6/uL — ABNORMAL LOW (ref 4.20–5.82)
RDW: 21.4 % — ABNORMAL HIGH (ref 11.0–14.6)
RETIC %: 2.56 % — AB (ref 0.80–1.80)
RETIC CT ABS: 51.97 10*3/uL (ref 34.80–93.90)
WBC: 4.5 10*3/uL (ref 4.0–10.3)
lymph#: 1 10*3/uL (ref 0.9–3.3)
nRBC: 0 % (ref 0–0)

## 2014-10-04 MED ORDER — SODIUM CHLORIDE 0.9 % IV SOLN
250.0000 mL | Freq: Once | INTRAVENOUS | Status: AC
  Administered 2014-10-04: 250 mL via INTRAVENOUS

## 2014-10-04 NOTE — Telephone Encounter (Signed)
SPOKE TO DR.GORSUCH'S NURSE, [facility]. SHE WILL CONTACT [facility] CONCERNING AN ORDER FOR LAB WORK.

## 2014-10-04 NOTE — Telephone Encounter (Signed)
Wife also called Desk RN and asks when Dr. Alvy Bimler wants pt to have labs drawn again at [facility]?

## 2014-10-04 NOTE — Telephone Encounter (Signed)
DOES DR.GORSUCH STILL WANT PT.TO HAVE LAB WORK EVERY OTHER WEEK? PT.'S WIFE WILL CHECK WITH [facility] TO SEE IF THE RESULTS CAN BE FAXED TO THIS OFFICE EARLIER.

## 2014-10-04 NOTE — Patient Instructions (Signed)

## 2014-10-04 NOTE — Telephone Encounter (Signed)
Every other week, let's start on Tuesday 3/22

## 2014-10-04 NOTE — Progress Notes (Signed)
Visited per request of wife [name], who feels significant caregiving stress.  Provided pastoral presence, empathic listening, and spiritual/emotional support.  Plan to follow up with [facility] later in waiting room for further support.  [place], [place]

## 2014-10-05 ENCOUNTER — Telehealth: Payer: Self-pay | Admitting: *Deleted

## 2014-10-05 LAB — TYPE AND SCREEN
ABO/RH(D): O POS
Antibody Screen: NEGATIVE
Unit division: 0

## 2014-10-05 NOTE — Telephone Encounter (Signed)
Faxed order to [facility] to check CBC every other week starting on Tues 3/22 per Dr. Alvy Bimler.  Informed wife of orders.  She verbalized understanding.

## 2014-10-18 ENCOUNTER — Non-Acute Institutional Stay (SKILLED_NURSING_FACILITY): Payer: Medicare Other | Admitting: Nurse Practitioner

## 2014-10-18 ENCOUNTER — Encounter: Payer: Self-pay | Admitting: Nurse Practitioner

## 2014-10-18 DIAGNOSIS — K269 Duodenal ulcer, unspecified as acute or chronic, without hemorrhage or perforation: Secondary | ICD-10-CM

## 2014-10-18 DIAGNOSIS — F0390 Unspecified dementia without behavioral disturbance: Secondary | ICD-10-CM | POA: Diagnosis not present

## 2014-10-18 DIAGNOSIS — E039 Hypothyroidism, unspecified: Secondary | ICD-10-CM | POA: Diagnosis not present

## 2014-10-18 DIAGNOSIS — N183 Chronic kidney disease, stage 3 (moderate): Secondary | ICD-10-CM | POA: Diagnosis not present

## 2014-10-18 DIAGNOSIS — I509 Heart failure, unspecified: Secondary | ICD-10-CM

## 2014-10-18 DIAGNOSIS — F329 Major depressive disorder, single episode, unspecified: Secondary | ICD-10-CM

## 2014-10-18 DIAGNOSIS — I10 Essential (primary) hypertension: Secondary | ICD-10-CM | POA: Diagnosis not present

## 2014-10-18 DIAGNOSIS — N189 Chronic kidney disease, unspecified: Secondary | ICD-10-CM

## 2014-10-18 DIAGNOSIS — E1122 Type 2 diabetes mellitus with diabetic chronic kidney disease: Secondary | ICD-10-CM

## 2014-10-18 DIAGNOSIS — D469 Myelodysplastic syndrome, unspecified: Secondary | ICD-10-CM | POA: Diagnosis not present

## 2014-10-18 DIAGNOSIS — D631 Anemia in chronic kidney disease: Secondary | ICD-10-CM

## 2014-10-18 DIAGNOSIS — E875 Hyperkalemia: Secondary | ICD-10-CM | POA: Diagnosis not present

## 2014-10-18 DIAGNOSIS — D696 Thrombocytopenia, unspecified: Secondary | ICD-10-CM

## 2014-10-18 DIAGNOSIS — F32A Depression, unspecified: Secondary | ICD-10-CM

## 2014-10-18 NOTE — Assessment & Plan Note (Signed)
stable, continue Cymbalta 60mg  daily.

## 2014-10-18 NOTE — Assessment & Plan Note (Signed)
09/22/14 [facility]: Hx of MDS and no longer responding to Aranesp-transfuse only in future. CBC Thr-transfusion Hgb<8

## 2014-10-18 NOTE — Assessment & Plan Note (Signed)
Levothyroxine 4mcg, TSH 2.55 12/01/13.  01/12/14 TSH 2.83 07/28/14 TSH 2.243

## 2014-10-18 NOTE — Assessment & Plan Note (Signed)
compensated on Furosemide 40mg  and Spironolactone 25mg  daily, trace BLE edema-chronic. CXR 06/29/13 showed no pulmonary vascular congestion.

## 2014-10-18 NOTE — Assessment & Plan Note (Signed)
09/30/14 Hgb 7.4 10/04/14 transfusion x 2 units.

## 2014-10-18 NOTE — Assessment & Plan Note (Signed)
Stable, takes Omeprazole 20mg  daily, no c/o stomach pain, indigestion, or melena.

## 2014-10-18 NOTE — Assessment & Plan Note (Signed)
Controlled on Metoprolol 25mg  bid. off Losartan due to CKD. Creatinine 1.5-2.0.

## 2014-10-18 NOTE — Assessment & Plan Note (Signed)
plt 83 09/30/14

## 2014-10-18 NOTE — Assessment & Plan Note (Signed)
07/22/15 K 4.2

## 2014-10-18 NOTE — Assessment & Plan Note (Signed)
2/22/18y CBG elevated 200s still-Lantus 54 qd Novolog 12u for CBG >450 instead of 5u with meals.

## 2014-10-18 NOTE — Progress Notes (Signed)
Patient ID: [name] [name], male   DOB: [birth year], 81 y.o.   MRN: [id number]   Code Status: DNR  Allergies  Allergen Reactions  . Enalapril Other (See Comments)    cough  . Metformin And Related Diarrhea  . Milk-Related Compounds Other (See Comments)    Doesn't remember but its a mild reaction    Chief Complaint  Patient presents with  . Medical Management of Chronic Issues    HPI: Patient is a 81 y.o. male seen in the SNF at [facility] today for evaluation of chronic medical conditions.  Problem List Items Addressed This Visit    Duodenal ulcer - Primary    Stable, takes Omeprazole 20mg  daily, no c/o stomach pain, indigestion, or melena.         CHF (congestive heart failure)    compensated on Furosemide 40mg  and Spironolactone 25mg  daily, trace BLE edema-chronic. CXR 06/29/13 showed no pulmonary vascular congestion.        CKD (chronic kidney disease)    Creatinine 1.5-2.0       Type 2 diabetes mellitus with diabetic chronic kidney disease    2/22/16y CBG elevated 200s still-Lantus 54 qd Novolog 12u for CBG >450 instead of 5u with meals.         Depression    stable, continue Cymbalta 60mg  daily.        Dementia    No change, off Namenda R>B in setting of bone marrow dysplasia . Continue SNF for care needs         Essential hypertension, benign    Controlled on Metoprolol 25mg  bid. off Losartan due to CKD. Creatinine 1.5-2.0.       Hypothyroidism    Levothyroxine 74mcg, TSH 2.55 12/01/13.  01/12/14 TSH 2.83 07/28/14 TSH 2.243        Anemia in chronic kidney disease    09/22/14 [facility] Center: Hx of MDS and no longer responding to Aranesp-transfuse only in future. CBC Thr-transfusion Hgb<8       Thrombocytopenia    plt 83 09/30/14      Hyperkalemia    07/22/15 K 4.2       MDS (myelodysplastic syndrome)    09/30/14 Hgb 7.4 10/04/14 transfusion x 2 units.           Review of Systems:  Review of Systems  Constitutional:  Negative for fever, chills and diaphoresis.  HENT: Positive for hearing loss. Negative for congestion, ear discharge, ear pain, nosebleeds, sore throat and tinnitus.   Eyes: Negative for photophobia, pain, discharge and redness.  Respiratory: Negative for cough, shortness of breath, wheezing and stridor.   Cardiovascular: Positive for leg swelling. Negative for chest pain and palpitations.       Trace in ankles on and off   Gastrointestinal: Negative for nausea, vomiting, abdominal pain, diarrhea, constipation and blood in stool.  Endocrine: Negative for polydipsia.  Genitourinary: Positive for frequency. Negative for dysuria, urgency, hematuria and flank pain.  Musculoskeletal: Negative for myalgias, back pain and neck pain.  Skin: Negative for rash.  Allergic/Immunologic: Negative for environmental allergies.  Neurological: Negative for dizziness, tremors, seizures, weakness and headaches.  Hematological: Does not bruise/bleed easily.  Psychiatric/Behavioral: Negative for suicidal ideas and hallucinations. The patient is nervous/anxious.      Past Medical History  Diagnosis Date  . Hypothyroidism   . Hypertension   . CHF (congestive heart failure)   . Diabetes mellitus   . Coronary artery disease   . Restless leg syndrome   .  Hyperlipemia   . Renal disease     stage II  . Sleep apnea   . Cellulitis and abscess of leg   . Obesity   . Duodenal ulcer, perforated   . Diverticulosis   . Internal hemorrhoids   . CAD (coronary artery disease)   . Anemia   . Duodenal ulcer   . Allergy   . Blood transfusion   . Cataract   . Tuberculosis     as a child  . Contact dermatitis and other eczema, due to unspecified cause 10/06/2012  . Senile dementia with depressive features 05/26/2012  . Hyposmolality and/or hyponatremia 04/02/2012  . Diarrhea 03/26/2012  . Hemorrhage of rectum and anus 03/25/2012  . Pneumonia, organism unspecified 03/12/2012  . 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 Folate-deficiency anemia 01/07/2012  . Thrombocytopenia, unspecified 09/26/2011  . Loss of weight 09/11/2011  . Dysphagia, unspecified(787.20) 06/28/2011  . Chronic kidney disease, stage III (moderate) 03/22/2011  . Cramp of limb 08/10/2010  . Chronic kidney disease, stage II (mild) 03/22/2011  . Personal history of fall 05/05/2009  . Unspecified vitamin D deficiency 02/24/2009  . Blood in stool 11/18/2008  . Dermatophytosis of the body 06/23/2007  . Obesity, unspecified 06/23/2007  . Unspecified tinnitus 06/23/2007  . Edema 06/23/2007  . Shortness of breath 06/23/2007  . Flatulence, eructation, and gas pain 06/23/2007  . Other general symptoms(780.99) 06/23/2007  . Memory loss 08/13/2003  . Screening PSA (prostate specific antigen) 07/27/2014  . Prostate cancer screening 07/27/2014  . Weight loss 07/27/2014   Past Surgical History  Procedure Laterality Date  . Laparotomy  09/12/2011    Procedure: EXPLORATORY LAPAROTOMY;  Surgeon: Judieth Keens, DO;  Location: [facility];  Service: General;  Laterality: N/A;  REPAIR PERFORATED ULCER  . Gastrostomy  09/12/2011    Procedure: GASTROSTOMY;  Surgeon: Judieth Keens, DO;  Location: [facility];  Service: General;  Laterality: N/A;  BIOPSY DUODENAL ULCER  . Shoulder surgery  [place], [place]  . Jejunostomy feeding tube      removed  . Back surgery  1942  . Eye surgery  [phone]    cataracts  . Coronary artery bypass graft  2201   Social History:   reports that he has quit smoking. His smoking use included Cigarettes. He has never used smokeless tobacco. He reports that he does not drink alcohol or use illicit drugs.  Family History  Problem Relation Age of Onset  . Stroke Father     Medications: Patient's Medications  New Prescriptions   No medications on file  Previous Medications   ACETAMINOPHEN (TYLENOL) 325 MG TABLET    Take 650 mg by mouth every 4 (four) hours as needed (for  pain).    DARBEPOETIN ALFA-POLYSORBATE (ARANESP, ALBUMIN FREE,) 500 MCG/ML INJECTION    Inject 500 mcg into the skin every 30 (thirty) days. Gets inject at [facility]   DULOXETINE (CYMBALTA) 60 MG CAPSULE    Take 60 mg by mouth daily.   FLUTICASONE-SALMETEROL (ADVAIR HFA) 45-21 MCG/ACT INHALER    Inhale 2 puffs into the lungs every 12 (twelve) hours.    FOLIC ACID (FOLVITE) 1 MG TABLET    Take 1 mg by mouth daily.    FUROSEMIDE (LASIX) 40 MG TABLET    Take 40 mg by mouth daily.   INSULIN ASPART (NOVOLOG) 100 UNIT/ML INJECTION    Inject 5 Units into the skin 3 (three) times daily before meals. For CBG>150  INSULIN GLARGINE (LANTUS) 100 UNIT/ML INJECTION    Inject 54 Units into the skin at bedtime.   LEVOTHYROXINE (SYNTHROID, LEVOTHROID) 75 MCG TABLET    Take 75 mcg by mouth daily.    METOPROLOL TARTRATE (LOPRESSOR) 25 MG TABLET    Take 25 mg by mouth 2 (two) times daily.   OMEPRAZOLE (PRILOSEC) 20 MG CAPSULE    Take 1 capsule (20 mg total) by mouth daily.   SPIRONOLACTONE (ALDACTONE) 25 MG TABLET    Take 25 mg by mouth daily.   TIOTROPIUM (SPIRIVA) 18 MCG INHALATION CAPSULE    Place 18 mcg into inhaler and inhale daily. 2 puffs daily   VITAMIN B-12 (CYANOCOBALAMIN) 1000 MCG TABLET    Take 1,000 mcg by mouth daily.  Modified Medications   No medications on file  Discontinued Medications   No medications on file     Physical Exam: Physical Exam  Constitutional: He is oriented to person, place, and time. He appears well-developed and well-nourished. No distress.  HENT:  Head: Normocephalic and atraumatic.  Right Ear: External ear normal.  Left Ear: External ear normal.  Nose: Nose normal.  Mouth/Throat: Oropharynx is clear and moist. No oropharyngeal exudate.  Eyes: Conjunctivae and EOM are normal. Pupils are equal, round, and reactive to light.  Neck: Normal range of motion. Neck supple. No JVD present. No thyromegaly present.  Cardiovascular: Normal rate, regular rhythm and normal heart  sounds.   No murmur heard. Pulmonary/Chest: Effort normal. No respiratory distress. He has no wheezes. He has rales in the right lower field and the left lower field. He exhibits no tenderness.  Bibasilar as prior.   Abdominal: Soft. Bowel sounds are normal. He exhibits no distension and no mass. There is no tenderness. There is no rebound.  Musculoskeletal: Normal range of motion. He exhibits edema (trace in BLE). He exhibits no tenderness.  Trace edema in ankles on and off  Lymphadenopathy:    He has no cervical adenopathy.  Neurological: He is alert and oriented to person, place, and time. He has normal reflexes. No cranial nerve deficit. Coordination normal.  Skin: Skin is warm and dry. No rash noted. He is not diaphoretic. No erythema.  Small open area right buttock.  Psychiatric: Thought content normal. His mood appears anxious. His affect is angry and inappropriate. His affect is not blunt and not labile. His speech is not delayed, not tangential and not slurred. He is agitated and aggressive. He is not hyperactive, not slowed, not withdrawn, not actively hallucinating and not combative. Cognition and memory are impaired. He exhibits a depressed mood. He is communicative. He exhibits abnormal recent memory. He is attentive.    Filed Vitals:   10/18/14 1424  BP: 138/64  Pulse: 70  Temp: 97.5 F (36.4 C)  TempSrc: Tympanic  Resp: 18      Labs reviewed: Basic Metabolic Panel:  Recent Labs  01/12/14  04/08/14  07/28/14 07/28/14 1035 08/11/14 0926 09/22/14 0905  NA 135*  < > 134*  < >  --  135* 134* 135*  K 4.5  < > 4.5  < >  --  5.3* 4.6 5.6*  CO2  --   < >  --   < >  --  25 25 25   GLUCOSE  --   < >  --   < >  --  185* 154* 210*  BUN 46*  < > 35*  < >  --  39.5* 41.3* 34.4*  CREATININE 1.9*  < >  1.5*  < >  --  1.7* 1.7* 1.7*  CALCIUM  --   < >  --   < >  --  8.9 8.6 9.0  TSH 2.83  --  2.51  --  2.24  --   --   --   < > = values in this interval not displayed. Liver  Function Tests:  Recent Labs  07/28/14 1035 08/11/14 0926 09/22/14 0905  AST 9 12 10   ALT 8 9 11   ALKPHOS 87 100 109  BILITOT 0.42 0.37 0.39  PROT 6.4 6.8 6.2*  ALBUMIN 2.9* 3.3* 3.1*   No results for input(s): LIPASE, AMYLASE in the last 8760 hours. No results for input(s): AMMONIA in the last 8760 hours. CBC:  Recent Labs  09/08/14 0920 09/22/14 0905 09/30/14 10/04/14 0839  WBC 4.5 4.4 4.3 4.5  NEUTROABS 3.1 3.1  --  3.1  HGB 9.0* 8.4* 7.4* 7.9*  HCT 26.3* 24.5* 22* 23.5*  MCV 113.9* 113.4*  --  115.8*  PLT 90* 92* 83* 93*   Lipid Panel: No results for input(s): CHOL, HDL, LDLCALC, TRIG, CHOLHDL, LDLDIRECT in the last 8760 hours.  Past Procedures:  CXR 06/29/13 showed there is mild patchy atelectasis or interstitial pneumonitis at the right lung base appearing significantly improved.  Assessment/Plan Duodenal ulcer Stable, takes Omeprazole 20mg  daily, no c/o stomach pain, indigestion, or melena.      CHF (congestive heart failure) compensated on Furosemide 40mg  and Spironolactone 25mg  daily, trace BLE edema-chronic. CXR 06/29/13 showed no pulmonary vascular congestion.     CKD (chronic kidney disease) Creatinine 1.5-2.0    Type 2 diabetes mellitus with diabetic chronic kidney disease 2/22/16y CBG elevated 200s still-Lantus 54 qd Novolog 12u for CBG >450 instead of 5u with meals.      Depression stable, continue Cymbalta 60mg  daily.     Dementia No change, off Namenda R>B in setting of bone marrow dysplasia . Continue SNF for care needs      Essential hypertension, benign Controlled on Metoprolol 25mg  bid. off Losartan due to CKD. Creatinine 1.5-2.0.    Hypothyroidism Levothyroxine 41mcg, TSH 2.55 12/01/13.  01/12/14 TSH 2.83 07/28/14 TSH 2.243     Anemia in chronic kidney disease 09/22/14 Cancer Center: Hx of MDS and no longer responding to Aranesp-transfuse only in future. CBC Thr-transfusion Hgb<8    Thrombocytopenia plt 83  09/30/14   Hyperkalemia 07/22/15 K 4.2    MDS (myelodysplastic syndrome) 09/30/14 Hgb 7.4 10/04/14 transfusion x 2 units.       Family/ Staff Communication: observe the patient  Goals of Care: SNF  Labs/tests ordered: none

## 2014-10-18 NOTE — Assessment & Plan Note (Signed)
No change, off Namenda R>B in setting of bone marrow dysplasia . Continue SNF for care needs

## 2014-10-18 NOTE — Assessment & Plan Note (Signed)
Creatinine 1.5-2.0

## 2014-10-19 LAB — CBC AND DIFFERENTIAL
HCT: 23 % — AB (ref 41–53)
Hemoglobin: 7.6 g/dL — AB (ref 13.5–17.5)
Platelets: 26 10*3/uL — AB (ref 150–399)
WBC: 5 10^3/mL

## 2014-10-20 ENCOUNTER — Other Ambulatory Visit: Payer: Self-pay | Admitting: *Deleted

## 2014-10-20 ENCOUNTER — Telehealth: Payer: Self-pay | Admitting: *Deleted

## 2014-10-20 ENCOUNTER — Other Ambulatory Visit: Payer: Self-pay | Admitting: Nurse Practitioner

## 2014-10-20 ENCOUNTER — Telehealth: Payer: Self-pay | Admitting: Hematology and Oncology

## 2014-10-20 DIAGNOSIS — D469 Myelodysplastic syndrome, unspecified: Secondary | ICD-10-CM

## 2014-10-20 NOTE — Telephone Encounter (Signed)
Per staff message and POF I have scheduled appts. Advised scheduler of appts. [name]  

## 2014-10-20 NOTE — Telephone Encounter (Signed)
left voicemail for pt regarding to 3.25 appt.Marland KitchenMarland KitchenMarland Kitchen

## 2014-10-20 NOTE — Telephone Encounter (Signed)
Wife confirmed pts appt for lab on Friday at 9:15 am followed by transfusion appt at 10:15 am.

## 2014-10-20 NOTE — Telephone Encounter (Signed)
Left VM for wife informing of need for transfusion and order for one unit of blood this Friday.

## 2014-10-20 NOTE — Telephone Encounter (Signed)
CBC from [facility] collected yesterday 3/22 received today and forwarded to Dr. Alvy Bimler for review.  Hgb is 7.6.

## 2014-10-22 ENCOUNTER — Other Ambulatory Visit: Payer: Self-pay | Admitting: *Deleted

## 2014-10-22 ENCOUNTER — Ambulatory Visit (HOSPITAL_BASED_OUTPATIENT_CLINIC_OR_DEPARTMENT_OTHER): Payer: Medicare Other

## 2014-10-22 ENCOUNTER — Other Ambulatory Visit (HOSPITAL_BASED_OUTPATIENT_CLINIC_OR_DEPARTMENT_OTHER): Payer: Medicare Other

## 2014-10-22 VITALS — BP 128/60 | HR 74 | Temp 98.3°F | Resp 18

## 2014-10-22 DIAGNOSIS — D469 Myelodysplastic syndrome, unspecified: Secondary | ICD-10-CM

## 2014-10-22 LAB — CBC & DIFF AND RETIC
BASO%: 0 % (ref 0.0–2.0)
BASOS ABS: 0 10*3/uL (ref 0.0–0.1)
EOS ABS: 0.3 10*3/uL (ref 0.0–0.5)
EOS%: 6.3 % (ref 0.0–7.0)
HEMATOCRIT: 22.8 % — AB (ref 38.4–49.9)
HEMOGLOBIN: 7.6 g/dL — AB (ref 13.0–17.1)
IMMATURE RETIC FRACT: 12.1 % — AB (ref 3.00–10.60)
LYMPH%: 23.4 % (ref 14.0–49.0)
MCH: 37.4 pg — AB (ref 27.2–33.4)
MCHC: 33.3 g/dL (ref 32.0–36.0)
MCV: 112.3 fL — ABNORMAL HIGH (ref 79.3–98.0)
MONO#: 0.2 10*3/uL (ref 0.1–0.9)
MONO%: 5.6 % (ref 0.0–14.0)
NEUT#: 2.8 10*3/uL (ref 1.5–6.5)
NEUT%: 64.7 % (ref 39.0–75.0)
Platelets: 76 10*3/uL — ABNORMAL LOW (ref 140–400)
RBC: 2.03 10*6/uL — ABNORMAL LOW (ref 4.20–5.82)
RETIC CT ABS: 42.83 10*3/uL (ref 34.80–93.90)
Retic %: 2.11 % — ABNORMAL HIGH (ref 0.80–1.80)
WBC: 4.3 10*3/uL (ref 4.0–10.3)
lymph#: 1 10*3/uL (ref 0.9–3.3)
nRBC: 0 % (ref 0–0)

## 2014-10-22 LAB — HOLD TUBE, BLOOD BANK

## 2014-10-22 LAB — PREPARE RBC (CROSSMATCH)

## 2014-10-22 MED ORDER — ACETAMINOPHEN 325 MG PO TABS
ORAL_TABLET | ORAL | Status: AC
  Filled 2014-10-22: qty 2

## 2014-10-22 MED ORDER — SODIUM CHLORIDE 0.9 % IV SOLN
250.0000 mL | Freq: Once | INTRAVENOUS | Status: AC
  Administered 2014-10-22: 250 mL via INTRAVENOUS

## 2014-10-22 MED ORDER — ACETAMINOPHEN 325 MG PO TABS
650.0000 mg | ORAL_TABLET | Freq: Once | ORAL | Status: AC
  Administered 2014-10-22: 650 mg via ORAL

## 2014-10-22 MED ORDER — DIPHENHYDRAMINE HCL 25 MG PO CAPS
25.0000 mg | ORAL_CAPSULE | Freq: Once | ORAL | Status: AC
  Administered 2014-10-22: 25 mg via ORAL

## 2014-10-22 MED ORDER — SODIUM CHLORIDE 0.9 % IJ SOLN
10.0000 mL | INTRAMUSCULAR | Status: DC | PRN
  Filled 2014-10-22: qty 10

## 2014-10-22 MED ORDER — DIPHENHYDRAMINE HCL 25 MG PO CAPS
ORAL_CAPSULE | ORAL | Status: AC
  Filled 2014-10-22: qty 1

## 2014-10-22 NOTE — Patient Instructions (Signed)

## 2014-10-23 LAB — TYPE AND SCREEN
ABO/RH(D): O POS
ANTIBODY SCREEN: NEGATIVE
Unit division: 0

## 2014-11-02 ENCOUNTER — Telehealth: Payer: Self-pay | Admitting: *Deleted

## 2014-11-02 ENCOUNTER — Other Ambulatory Visit: Payer: Self-pay | Admitting: Hematology and Oncology

## 2014-11-02 DIAGNOSIS — D469 Myelodysplastic syndrome, unspecified: Secondary | ICD-10-CM

## 2014-11-02 LAB — CBC AND DIFFERENTIAL
HCT: 23 % — AB (ref 41–53)
HEMOGLOBIN: 7.8 g/dL — AB (ref 13.5–17.5)
Platelets: 72 10*3/uL — AB (ref 150–399)
WBC: 4.6 10^3/mL

## 2014-11-02 NOTE — Telephone Encounter (Signed)
Placed order for 2 units for Friday

## 2014-11-02 NOTE — Telephone Encounter (Signed)
CBC from [facility].  Hgb is 7.8,  Drawn today 11/02/14.  Forwarded to Dr. Alvy Bimler for review.

## 2014-11-02 NOTE — Telephone Encounter (Signed)
Sent msg to add 2 units PRBC and to contact MD/desk nurse so that she can call pt's wife with time.... KJ, also ask to add lab before treatment.

## 2014-11-03 ENCOUNTER — Other Ambulatory Visit: Payer: Self-pay | Admitting: *Deleted

## 2014-11-03 ENCOUNTER — Other Ambulatory Visit: Payer: Self-pay | Admitting: Nurse Practitioner

## 2014-11-03 ENCOUNTER — Telehealth: Payer: Self-pay | Admitting: *Deleted

## 2014-11-03 DIAGNOSIS — D469 Myelodysplastic syndrome, unspecified: Secondary | ICD-10-CM

## 2014-11-03 DIAGNOSIS — D696 Thrombocytopenia, unspecified: Secondary | ICD-10-CM

## 2014-11-03 NOTE — Telephone Encounter (Signed)
Wife left VM states she has worked out the transportation for pt this Friday and he will be here for appt at 11:15 am as scheduled.

## 2014-11-03 NOTE — Telephone Encounter (Signed)
Left VM for wife informing of transfusion scheduled for Friday 4/8. Asked her to call back to confirm.  Called [facility] and informed nurse of pt's appt date and time.

## 2014-11-03 NOTE — Telephone Encounter (Signed)
Wife called and left second number for [place]  [phone]

## 2014-11-04 ENCOUNTER — Ambulatory Visit (HOSPITAL_COMMUNITY)
Admission: RE | Admit: 2014-11-04 | Discharge: 2014-11-04 | Disposition: A | Discharge status: Home / Self Care | Payer: Medicare Other | Source: Ambulatory Visit | Attending: Hematology | Admitting: Hematology

## 2014-11-04 DIAGNOSIS — D469 Myelodysplastic syndrome, unspecified: Secondary | ICD-10-CM | POA: Insufficient documentation

## 2014-11-05 ENCOUNTER — Ambulatory Visit (HOSPITAL_BASED_OUTPATIENT_CLINIC_OR_DEPARTMENT_OTHER): Payer: Medicare Other

## 2014-11-05 ENCOUNTER — Other Ambulatory Visit (HOSPITAL_BASED_OUTPATIENT_CLINIC_OR_DEPARTMENT_OTHER): Payer: Medicare Other

## 2014-11-05 VITALS — BP 110/53 | HR 84 | Temp 98.2°F | Resp 18

## 2014-11-05 DIAGNOSIS — N189 Chronic kidney disease, unspecified: Secondary | ICD-10-CM

## 2014-11-05 DIAGNOSIS — D469 Myelodysplastic syndrome, unspecified: Secondary | ICD-10-CM | POA: Diagnosis present

## 2014-11-05 LAB — CBC & DIFF AND RETIC
BASO%: 0 % (ref 0.0–2.0)
Basophils Absolute: 0 10*3/uL (ref 0.0–0.1)
EOS%: 2.7 % (ref 0.0–7.0)
Eosinophils Absolute: 0.1 10*3/uL (ref 0.0–0.5)
HCT: 25.5 % — ABNORMAL LOW (ref 38.4–49.9)
HGB: 8.6 g/dL — ABNORMAL LOW (ref 13.0–17.1)
Immature Retic Fract: 11.4 % — ABNORMAL HIGH (ref 3.00–10.60)
LYMPH%: 26 % (ref 14.0–49.0)
MCH: 36.8 pg — ABNORMAL HIGH (ref 27.2–33.4)
MCHC: 33.7 g/dL (ref 32.0–36.0)
MCV: 109 fL — ABNORMAL HIGH (ref 79.3–98.0)
MONO#: 0.3 10*3/uL (ref 0.1–0.9)
MONO%: 5.2 % (ref 0.0–14.0)
NEUT%: 66.1 % (ref 39.0–75.0)
NEUTROS ABS: 3.2 10*3/uL (ref 1.5–6.5)
PLATELETS: 94 10*3/uL — AB (ref 140–400)
RBC: 2.34 10*6/uL — ABNORMAL LOW (ref 4.20–5.82)
Retic %: 1.84 % — ABNORMAL HIGH (ref 0.80–1.80)
Retic Ct Abs: 43.06 10*3/uL (ref 34.80–93.90)
WBC: 4.9 10*3/uL (ref 4.0–10.3)
lymph#: 1.3 10*3/uL (ref 0.9–3.3)
nRBC: 0 % (ref 0–0)

## 2014-11-05 LAB — HOLD TUBE, BLOOD BANK

## 2014-11-05 MED ORDER — ACETAMINOPHEN 325 MG PO TABS
650.0000 mg | ORAL_TABLET | Freq: Once | ORAL | Status: AC
  Administered 2014-11-05: 650 mg via ORAL

## 2014-11-05 MED ORDER — SODIUM CHLORIDE 0.9 % IV SOLN
250.0000 mL | Freq: Once | INTRAVENOUS | Status: AC
  Administered 2014-11-05: 250 mL via INTRAVENOUS

## 2014-11-05 MED ORDER — DIPHENHYDRAMINE HCL 25 MG PO CAPS
ORAL_CAPSULE | ORAL | Status: AC
  Filled 2014-11-05: qty 1

## 2014-11-05 MED ORDER — ACETAMINOPHEN 325 MG PO TABS
ORAL_TABLET | ORAL | Status: AC
  Filled 2014-11-05: qty 2

## 2014-11-05 MED ORDER — DIPHENHYDRAMINE HCL 25 MG PO CAPS
25.0000 mg | ORAL_CAPSULE | Freq: Once | ORAL | Status: AC
  Administered 2014-11-05: 25 mg via ORAL

## 2014-11-05 NOTE — Patient Instructions (Signed)

## 2014-11-05 NOTE — Progress Notes (Signed)
WBC 4.9, ANC 3.2, hemoglobin 8.6, and platelet count 94 today.  On Tuesday, 11/02/2014 hemoglobin was 7.8 when drawn while at the [place] home in which the patient resides.  Patient denies any worsening fatigue or any shortness of breath with exertion.  Decision made to only transfuse 1 unit of packed red blood cells today.  All findings reviewed with Dr. Burr Medico (on call) today.

## 2014-11-06 LAB — TYPE AND SCREEN
ABO/RH(D): O POS
Antibody Screen: NEGATIVE
Unit division: 0

## 2014-11-15 ENCOUNTER — Encounter: Payer: Self-pay | Admitting: Nurse Practitioner

## 2014-11-15 ENCOUNTER — Non-Acute Institutional Stay (SKILLED_NURSING_FACILITY): Payer: Medicare Other | Admitting: Nurse Practitioner

## 2014-11-15 DIAGNOSIS — D631 Anemia in chronic kidney disease: Secondary | ICD-10-CM

## 2014-11-15 DIAGNOSIS — E875 Hyperkalemia: Secondary | ICD-10-CM | POA: Diagnosis not present

## 2014-11-15 DIAGNOSIS — F329 Major depressive disorder, single episode, unspecified: Secondary | ICD-10-CM | POA: Diagnosis not present

## 2014-11-15 DIAGNOSIS — F32A Depression, unspecified: Secondary | ICD-10-CM

## 2014-11-15 DIAGNOSIS — N182 Chronic kidney disease, stage 2 (mild): Secondary | ICD-10-CM | POA: Diagnosis not present

## 2014-11-15 DIAGNOSIS — N189 Chronic kidney disease, unspecified: Secondary | ICD-10-CM | POA: Diagnosis not present

## 2014-11-15 DIAGNOSIS — E1122 Type 2 diabetes mellitus with diabetic chronic kidney disease: Secondary | ICD-10-CM | POA: Diagnosis not present

## 2014-11-15 DIAGNOSIS — K269 Duodenal ulcer, unspecified as acute or chronic, without hemorrhage or perforation: Secondary | ICD-10-CM | POA: Diagnosis not present

## 2014-11-15 DIAGNOSIS — F0390 Unspecified dementia without behavioral disturbance: Secondary | ICD-10-CM

## 2014-11-15 DIAGNOSIS — I10 Essential (primary) hypertension: Secondary | ICD-10-CM

## 2014-11-15 DIAGNOSIS — D469 Myelodysplastic syndrome, unspecified: Secondary | ICD-10-CM | POA: Diagnosis not present

## 2014-11-15 DIAGNOSIS — D696 Thrombocytopenia, unspecified: Secondary | ICD-10-CM | POA: Diagnosis not present

## 2014-11-15 DIAGNOSIS — E039 Hypothyroidism, unspecified: Secondary | ICD-10-CM | POA: Diagnosis not present

## 2014-11-15 DIAGNOSIS — I5020 Unspecified systolic (congestive) heart failure: Secondary | ICD-10-CM | POA: Diagnosis not present

## 2014-11-15 NOTE — Progress Notes (Signed)
Patient ID: [name] [name], male   DOB: [birth year], 81 y.o.   MRN: [id number]   Code Status: DNR  Allergies  Allergen Reactions  . Enalapril Other (See Comments)    cough  . Metformin And Related Diarrhea  . Milk-Related Compounds Other (See Comments)    Doesn't remember but its a mild reaction    Chief Complaint  Patient presents with  . Medical Management of Chronic Issues    HPI: Patient is a 81 y.o. male seen in the SNF at [facility] today for evaluation of chronic medical conditions.  Problem List Items Addressed This Visit    Duodenal ulcer    Stable, takes Omeprazole 20mg  daily, no c/o stomach pain, indigestion, or melena.         CHF (congestive heart failure)    compensated on Furosemide 40mg  and Spironolactone 25mg  daily, trace BLE edema-chronic. CXR 06/29/13 showed no pulmonary vascular congestion.       CKD (chronic kidney disease)    Creatinine 1.5-2.0       Type 2 diabetes mellitus with diabetic chronic kidney disease    2/22/16y CBG elevated 200s still-Lantus 54 qd Novolog 12u for CBG >450 instead of 5u with meals.        Depression    stable, continue Cymbalta 60mg  daily.         Dementia    No change, off Namenda R>B in setting of bone marrow dysplasia . Continue SNF for care needs       Essential hypertension, benign    Controlled on Metoprolol 25mg  bid. off Losartan due to CKD. Creatinine 1.5-2.0.        Hypothyroidism    Levothyroxine 69mcg, TSH 2.55 12/01/13.  01/12/14 TSH 2.83 07/28/14 TSH 2.243       Anemia in chronic kidney disease    11/16/14 Hgb 8.7, transfusion Hgb<8      Thrombocytopenia    11/16/14 Plt 68      Hyperkalemia    07/22/15 K 4.2       MDS (myelodysplastic syndrome) - Primary    06/02/14 Bone marrow biopsy: the overall features favor a myelodysplastic state particularly refractory cytopenia with multi lineage dysplasia.Plan of tx: observation, blood transfusions, Aranesp, and Vidaza SQ(chemo).    07/20/14 Hgb 7.0-f/u oncology.  08/25/14 Hgb 7.9, s/p transfusion 09/30/14 Hgb 7.4 10/04/14 transfusion x 2 units.  10/19/14 Hgb 7.6 11/02/14 Hgb 7.8-scheduled x2 units 11/16/14 Hgb 8.7          Review of Systems:  Review of Systems  Constitutional: Negative for fever, chills and diaphoresis.  HENT: Positive for hearing loss. Negative for congestion, ear discharge, ear pain, nosebleeds, sore throat and tinnitus.   Eyes: Negative for photophobia, pain, discharge and redness.  Respiratory: Negative for cough, shortness of breath, wheezing and stridor.   Cardiovascular: Positive for leg swelling. Negative for chest pain and palpitations.       Trace in ankles on and off   Gastrointestinal: Negative for nausea, vomiting, abdominal pain, diarrhea, constipation and blood in stool.  Endocrine: Negative for polydipsia.  Genitourinary: Positive for frequency. Negative for dysuria, urgency, hematuria and flank pain.  Musculoskeletal: Negative for myalgias, back pain and neck pain.  Skin: Negative for rash.  Allergic/Immunologic: Negative for environmental allergies.  Neurological: Negative for dizziness, tremors, seizures, weakness and headaches.  Hematological: Does not bruise/bleed easily.  Psychiatric/Behavioral: Negative for suicidal ideas and hallucinations. The patient is nervous/anxious.      Past Medical History  Diagnosis Date  .  Hypothyroidism   . Hypertension   . CHF (congestive heart failure)   . Diabetes mellitus   . Coronary artery disease   . Restless leg syndrome   . Hyperlipemia   . Renal disease     stage II  . Sleep apnea   . Cellulitis and abscess of leg   . Obesity   . Duodenal ulcer, perforated   . Diverticulosis   . Internal hemorrhoids   . CAD (coronary artery disease)   . Anemia   . Duodenal ulcer   . Allergy   . Blood transfusion   . Cataract   . Tuberculosis     as a child  . Contact dermatitis and other eczema, due to unspecified cause 10/06/2012   . Senile dementia with depressive features 05/26/2012  . Hyposmolality and/or hyponatremia 04/02/2012  . Diarrhea 03/26/2012  . Hemorrhage of rectum and anus 03/25/2012  . Pneumonia, organism unspecified 03/12/2012  . 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 Folate-deficiency anemia 01/07/2012  . Thrombocytopenia, unspecified 09/26/2011  . Loss of weight 09/11/2011  . Dysphagia, unspecified(787.20) 06/28/2011  . Chronic kidney disease, stage III (moderate) 03/22/2011  . Cramp of limb 08/10/2010  . Chronic kidney disease, stage II (mild) 03/22/2011  . Personal history of fall 05/05/2009  . Unspecified vitamin D deficiency 02/24/2009  . Blood in stool 11/18/2008  . Dermatophytosis of the body 06/23/2007  . Obesity, unspecified 06/23/2007  . Unspecified tinnitus 06/23/2007  . Edema 06/23/2007  . Shortness of breath 06/23/2007  . Flatulence, eructation, and gas pain 06/23/2007  . Other general symptoms(780.99) 06/23/2007  . Memory loss 08/13/2003  . Screening PSA (prostate specific antigen) 07/27/2014  . Prostate cancer screening 07/27/2014  . Weight loss 07/27/2014   Past Surgical History  Procedure Laterality Date  . Laparotomy  09/12/2011    Procedure: EXPLORATORY LAPAROTOMY;  Surgeon: Judieth Keens, DO;  Location: [facility]S;  Service: General;  Laterality: N/A;  REPAIR PERFORATED ULCER  . Gastrostomy  09/12/2011    Procedure: GASTROSTOMY;  Surgeon: Judieth Keens, DO;  Location: [facility];  Service: General;  Laterality: N/A;  BIOPSY DUODENAL ULCER  . Shoulder surgery  [place], [place]  . Jejunostomy feeding tube      removed  . Back surgery  1942  . Eye surgery  [phone]    cataracts  . Coronary artery bypass graft  2201   Social History:   reports that he has quit smoking. His smoking use included Cigarettes. He has never used smokeless tobacco. He reports that he does not drink alcohol or use illicit drugs.  Family History  Problem  Relation Age of Onset  . Stroke Father     Medications: Patient's Medications  New Prescriptions   No medications on file  Previous Medications   ACETAMINOPHEN (TYLENOL) 325 MG TABLET    Take 650 mg by mouth every 4 (four) hours as needed (for pain).    DARBEPOETIN ALFA-POLYSORBATE (ARANESP, ALBUMIN FREE,) 500 MCG/ML INJECTION    Inject 500 mcg into the skin every 30 (thirty) days. Gets inject at [facility]   DULOXETINE (CYMBALTA) 60 MG CAPSULE    Take 60 mg by mouth daily.   FLUTICASONE-SALMETEROL (ADVAIR HFA) 45-21 MCG/ACT INHALER    Inhale 2 puffs into the lungs every 12 (twelve) hours.    FOLIC ACID (FOLVITE) 1 MG TABLET    Take 1 mg by mouth daily.    FUROSEMIDE (LASIX) 40 MG TABLET  Take 40 mg by mouth daily.   INSULIN ASPART (NOVOLOG) 100 UNIT/ML INJECTION    Inject 5 Units into the skin 3 (three) times daily before meals. For CBG>150   INSULIN GLARGINE (LANTUS) 100 UNIT/ML INJECTION    Inject 54 Units into the skin at bedtime.   LEVOTHYROXINE (SYNTHROID, LEVOTHROID) 75 MCG TABLET    Take 75 mcg by mouth daily.    METOPROLOL TARTRATE (LOPRESSOR) 25 MG TABLET    Take 25 mg by mouth 2 (two) times daily.   OMEPRAZOLE (PRILOSEC) 20 MG CAPSULE    Take 1 capsule (20 mg total) by mouth daily.   SPIRONOLACTONE (ALDACTONE) 25 MG TABLET    Take 25 mg by mouth daily.   TIOTROPIUM (SPIRIVA) 18 MCG INHALATION CAPSULE    Place 18 mcg into inhaler and inhale daily. 2 puffs daily   VITAMIN B-12 (CYANOCOBALAMIN) 1000 MCG TABLET    Take 1,000 mcg by mouth daily.  Modified Medications   No medications on file  Discontinued Medications   No medications on file     Physical Exam: Physical Exam  Constitutional: He is oriented to person, place, and time. He appears well-developed and well-nourished. No distress.  HENT:  Head: Normocephalic and atraumatic.  Right Ear: External ear normal.  Left Ear: External ear normal.  Nose: Nose normal.  Mouth/Throat: Oropharynx is clear and moist. No  oropharyngeal exudate.  Eyes: Conjunctivae and EOM are normal. Pupils are equal, round, and reactive to light.  Neck: Normal range of motion. Neck supple. No JVD present. No thyromegaly present.  Cardiovascular: Normal rate, regular rhythm and normal heart sounds.   No murmur heard. Pulmonary/Chest: Effort normal. No respiratory distress. He has no wheezes. He has rales in the right lower field and the left lower field. He exhibits no tenderness.  Bibasilar as prior.   Abdominal: Soft. Bowel sounds are normal. He exhibits no distension and no mass. There is no tenderness. There is no rebound.  Musculoskeletal: Normal range of motion. He exhibits edema (trace in BLE). He exhibits no tenderness.  Trace edema in ankles on and off  Lymphadenopathy:    He has no cervical adenopathy.  Neurological: He is alert and oriented to person, place, and time. He has normal reflexes. No cranial nerve deficit. Coordination normal.  Skin: Skin is warm and dry. No rash noted. He is not diaphoretic. No erythema.  Small open area right buttock.  Psychiatric: Thought content normal. His mood appears anxious. His affect is angry and inappropriate. His affect is not blunt and not labile. His speech is not delayed, not tangential and not slurred. He is agitated and aggressive. He is not hyperactive, not slowed, not withdrawn, not actively hallucinating and not combative. Cognition and memory are impaired. He exhibits a depressed mood. He is communicative. He exhibits abnormal recent memory. He is attentive.    Filed Vitals:   11/15/14 1416  BP: 118/60  Pulse: 86  Temp: 98.4 F (36.9 C)  TempSrc: Tympanic  Resp: 18      Labs reviewed: Basic Metabolic Panel:  Recent Labs  01/12/14  04/08/14  07/28/14 07/28/14 1035 08/11/14 0926 09/22/14 0905  NA 135*  < > 134*  < >  --  135* 134* 135*  K 4.5  < > 4.5  < >  --  5.3* 4.6 5.6*  CO2  --   < >  --   < >  --  $R'25 25 25  'CW$ GLUCOSE  --   < >  --   < >  --  185*  154* 210*  BUN 46*  < > 35*  < >  --  39.5* 41.3* 34.4*  CREATININE 1.9*  < > 1.5*  < >  --  1.7* 1.7* 1.7*  CALCIUM  --   < >  --   < >  --  8.9 8.6 9.0  TSH 2.83  --  2.51  --  2.24  --   --   --   < > = values in this interval not displayed. Liver Function Tests:  Recent Labs  07/28/14 1035 08/11/14 0926 09/22/14 0905  AST 9 12 10   ALT 8 9 11   ALKPHOS 87 100 109  BILITOT 0.42 0.37 0.39  PROT 6.4 6.8 6.2*  ALBUMIN 2.9* 3.3* 3.1*   No results for input(s): LIPASE, AMYLASE in the last 8760 hours. No results for input(s): AMMONIA in the last 8760 hours. CBC:  Recent Labs  10/04/14 0839  10/22/14 0915 11/02/14 11/05/14 1112 11/16/14  WBC 4.5  < > 4.3 4.6 4.9 5.4  NEUTROABS 3.1  --  2.8  --  3.2  --   HGB 7.9*  < > 7.6* 7.8* 8.6* 8.7*  HCT 23.5*  < > 22.8* 23* 25.5* 26*  MCV 115.8*  --  112.3*  --  109.0*  --   PLT 93*  < > 76* 72* 94* 68*  < > = values in this interval not displayed. Lipid Panel: No results for input(s): CHOL, HDL, LDLCALC, TRIG, CHOLHDL, LDLDIRECT in the last 8760 hours.  Past Procedures:  CXR 06/29/13 showed there is mild patchy atelectasis or interstitial pneumonitis at the right lung base appearing significantly improved.  Assessment/Plan MDS (myelodysplastic syndrome) 06/02/14 Bone marrow biopsy: the overall features favor a myelodysplastic state particularly refractory cytopenia with multi lineage dysplasia.Plan of tx: observation, blood transfusions, Aranesp, and Vidaza SQ(chemo).  07/20/14 Hgb 7.0-f/u oncology.  08/25/14 Hgb 7.9, s/p transfusion 09/30/14 Hgb 7.4 10/04/14 transfusion x 2 units.  10/19/14 Hgb 7.6 11/02/14 Hgb 7.8-scheduled x2 units 11/16/14 Hgb 8.7    Duodenal ulcer Stable, takes Omeprazole 20mg  daily, no c/o stomach pain, indigestion, or melena.      CHF (congestive heart failure) compensated on Furosemide 40mg  and Spironolactone 25mg  daily, trace BLE edema-chronic. CXR 06/29/13 showed no pulmonary vascular  congestion.    CKD (chronic kidney disease) Creatinine 1.5-2.0    Type 2 diabetes mellitus with diabetic chronic kidney disease 2/22/16y CBG elevated 200s still-Lantus 54 qd Novolog 12u for CBG >450 instead of 5u with meals.     Depression stable, continue Cymbalta 60mg  daily.      Dementia No change, off Namenda R>B in setting of bone marrow dysplasia . Continue SNF for care needs    Essential hypertension, benign Controlled on Metoprolol 25mg  bid. off Losartan due to CKD. Creatinine 1.5-2.0.     Hypothyroidism Levothyroxine 70mcg, TSH 2.55 12/01/13.  01/12/14 TSH 2.83 07/28/14 TSH 2.243    Anemia in chronic kidney disease 11/16/14 Hgb 8.7, transfusion Hgb<8   Thrombocytopenia 11/16/14 Plt 68   Hyperkalemia 07/22/15 K 4.2      Family/ Staff Communication: observe the patient  Goals of Care: SNF  Labs/tests ordered: none

## 2014-11-16 LAB — CBC AND DIFFERENTIAL
HCT: 26 % — AB (ref 41–53)
Hemoglobin: 8.7 g/dL — AB (ref 13.5–17.5)
PLATELETS: 68 10*3/uL — AB (ref 150–399)
WBC: 5.4 10*3/mL

## 2014-11-17 ENCOUNTER — Telehealth: Payer: Self-pay | Admitting: *Deleted

## 2014-11-17 ENCOUNTER — Telehealth: Payer: Self-pay

## 2014-11-17 NOTE — Assessment & Plan Note (Signed)
Stable, takes Omeprazole 20mg  daily, no c/o stomach pain, indigestion, or melena.

## 2014-11-17 NOTE — Assessment & Plan Note (Signed)
11/16/14 Hgb 8.7, transfusion Hgb<8

## 2014-11-17 NOTE — Assessment & Plan Note (Signed)
No change, off Namenda R>B in setting of bone marrow dysplasia . Continue SNF for care needs

## 2014-11-17 NOTE — Assessment & Plan Note (Signed)
compensated on Furosemide 40mg  and Spironolactone 25mg  daily, trace BLE edema-chronic. CXR 06/29/13 showed no pulmonary vascular congestion.

## 2014-11-17 NOTE — Assessment & Plan Note (Signed)
11/16/14 Plt 68

## 2014-11-17 NOTE — Telephone Encounter (Signed)
CBC results received from [facility] and reviewed by Dr. Alvy Bimler.  Hgb is 8.7 and no need for transfusion this week.  Informed wife and she verbalized understanding.

## 2014-11-17 NOTE — Assessment & Plan Note (Signed)
Levothyroxine 69mcg, TSH 2.55 12/01/13.  01/12/14 TSH 2.83 07/28/14 TSH 2.243

## 2014-11-17 NOTE — Assessment & Plan Note (Signed)
06/02/14 Bone marrow biopsy: the overall features favor a myelodysplastic state particularly refractory cytopenia with multi lineage dysplasia.Plan of tx: observation, blood transfusions, Aranesp, and Vidaza SQ(chemo).  07/20/14 Hgb 7.0-f/u oncology.  08/25/14 Hgb 7.9, s/p transfusion 09/30/14 Hgb 7.4 10/04/14 transfusion x 2 units.  10/19/14 Hgb 7.6 11/02/14 Hgb 7.8-scheduled x2 units 11/16/14 Hgb 8.7

## 2014-11-17 NOTE — Assessment & Plan Note (Signed)
Creatinine 1.5-2.0

## 2014-11-17 NOTE — Telephone Encounter (Signed)
Wife lvm she is expecting a message from [place] about what's going on. S/w cameo and pt gets labs drawn at nursing home every other week. This is what the call is about. Cameo will check for lab results and call wife back

## 2014-11-17 NOTE — Assessment & Plan Note (Signed)
Controlled on Metoprolol 25mg  bid. off Losartan due to CKD. Creatinine 1.5-2.0.

## 2014-11-17 NOTE — Assessment & Plan Note (Signed)
stable, continue Cymbalta 60mg  daily.

## 2014-11-17 NOTE — Assessment & Plan Note (Signed)
2/22/18y CBG elevated 200s still-Lantus 54 qd Novolog 12u for CBG >450 instead of 5u with meals.

## 2014-11-17 NOTE — Assessment & Plan Note (Signed)
07/22/15 K 4.2

## 2014-11-30 ENCOUNTER — Telehealth: Payer: Self-pay | Admitting: Hematology and Oncology

## 2014-11-30 ENCOUNTER — Other Ambulatory Visit: Payer: Self-pay | Admitting: Hematology and Oncology

## 2014-11-30 ENCOUNTER — Telehealth: Payer: Self-pay | Admitting: *Deleted

## 2014-11-30 DIAGNOSIS — D469 Myelodysplastic syndrome, unspecified: Secondary | ICD-10-CM

## 2014-11-30 LAB — CBC AND DIFFERENTIAL
HCT: 22 % — AB (ref 41–53)
HEMOGLOBIN: 7.5 g/dL — AB (ref 13.5–17.5)
Platelets: 80 10*3/uL — AB (ref 150–399)
WBC: 4.3 10*3/mL

## 2014-11-30 NOTE — Telephone Encounter (Signed)
Per 05/03 labs/2 units of blood added on 05/06... Sent msg to add blood and will contact pt's wife with the time.... KJ

## 2014-11-30 NOTE — Telephone Encounter (Signed)
CBC received from [facility]  Hgb 7.5.  Reviewed by Dr. Alvy Bimler and she ordered 2 units of blood transfused this week.  Sent POF to scheduler to add pt for lab and transfusion on Friday 5/6.  LVM for pt's wife informing her of this plan and to expect a call from scheduling.

## 2014-12-01 ENCOUNTER — Other Ambulatory Visit: Payer: Self-pay | Admitting: Nurse Practitioner

## 2014-12-01 DIAGNOSIS — D469 Myelodysplastic syndrome, unspecified: Secondary | ICD-10-CM

## 2014-12-02 ENCOUNTER — Telehealth: Payer: Self-pay | Admitting: Hematology and Oncology

## 2014-12-02 ENCOUNTER — Other Ambulatory Visit: Payer: Self-pay | Admitting: Hematology and Oncology

## 2014-12-02 ENCOUNTER — Ambulatory Visit (HOSPITAL_COMMUNITY)
Admission: RE | Admit: 2014-12-02 | Discharge: 2014-12-02 | Disposition: A | Discharge status: Home / Self Care | Payer: Medicare Other | Source: Ambulatory Visit | Attending: Hematology | Admitting: Hematology

## 2014-12-02 DIAGNOSIS — D469 Myelodysplastic syndrome, unspecified: Secondary | ICD-10-CM

## 2014-12-02 NOTE — Telephone Encounter (Signed)
S/w pt's wife confirming labs/blood transfusion for 05/06, I advised her that pt is showing that he is at [facility] for an infusion now, she is going to see what is going on and call our office back, she didn't want him scheduled twice if not needed.... KJ

## 2014-12-03 ENCOUNTER — Encounter: Payer: Self-pay | Admitting: General Practice

## 2014-12-03 ENCOUNTER — Ambulatory Visit (HOSPITAL_BASED_OUTPATIENT_CLINIC_OR_DEPARTMENT_OTHER): Payer: Medicare Other

## 2014-12-03 ENCOUNTER — Other Ambulatory Visit (HOSPITAL_BASED_OUTPATIENT_CLINIC_OR_DEPARTMENT_OTHER): Payer: Medicare Other

## 2014-12-03 VITALS — BP 108/50 | HR 72 | Temp 98.3°F | Resp 18

## 2014-12-03 DIAGNOSIS — N189 Chronic kidney disease, unspecified: Secondary | ICD-10-CM | POA: Diagnosis not present

## 2014-12-03 DIAGNOSIS — D469 Myelodysplastic syndrome, unspecified: Secondary | ICD-10-CM | POA: Diagnosis present

## 2014-12-03 LAB — CBC WITH DIFFERENTIAL/PLATELET
BASO%: 0.2 % (ref 0.0–2.0)
Basophils Absolute: 0 10*3/uL (ref 0.0–0.1)
EOS%: 3.4 % (ref 0.0–7.0)
Eosinophils Absolute: 0.2 10*3/uL (ref 0.0–0.5)
HEMATOCRIT: 23 % — AB (ref 38.4–49.9)
HGB: 7.8 g/dL — ABNORMAL LOW (ref 13.0–17.1)
LYMPH%: 25.8 % (ref 14.0–49.0)
MCH: 36.1 pg — ABNORMAL HIGH (ref 27.2–33.4)
MCHC: 33.9 g/dL (ref 32.0–36.0)
MCV: 106.5 fL — ABNORMAL HIGH (ref 79.3–98.0)
MONO#: 0.3 10*3/uL (ref 0.1–0.9)
MONO%: 5.5 % (ref 0.0–14.0)
NEUT#: 3.1 10*3/uL (ref 1.5–6.5)
NEUT%: 65.1 % (ref 39.0–75.0)
Platelets: 82 10*3/uL — ABNORMAL LOW (ref 140–400)
RBC: 2.16 10*6/uL — ABNORMAL LOW (ref 4.20–5.82)
WBC: 4.7 10*3/uL (ref 4.0–10.3)
lymph#: 1.2 10*3/uL (ref 0.9–3.3)
nRBC: 0 % (ref 0–0)

## 2014-12-03 LAB — PREPARE RBC (CROSSMATCH)

## 2014-12-03 LAB — HOLD TUBE, BLOOD BANK

## 2014-12-03 MED ORDER — ACETAMINOPHEN 325 MG PO TABS
650.0000 mg | ORAL_TABLET | Freq: Once | ORAL | Status: AC
Start: 2014-12-03 — End: 2014-12-03
  Administered 2014-12-03: 650 mg via ORAL

## 2014-12-03 MED ORDER — FUROSEMIDE 10 MG/ML IJ SOLN
20.0000 mg | Freq: Once | INTRAMUSCULAR | Status: AC
  Administered 2014-12-03: 20 mg via INTRAVENOUS

## 2014-12-03 MED ORDER — ACETAMINOPHEN 325 MG PO TABS
ORAL_TABLET | ORAL | Status: AC
  Filled 2014-12-03: qty 2

## 2014-12-03 MED ORDER — SODIUM CHLORIDE 0.9 % IV SOLN
250.0000 mL | Freq: Once | INTRAVENOUS | Status: AC
  Administered 2014-12-03: 250 mL via INTRAVENOUS

## 2014-12-03 MED ORDER — DIPHENHYDRAMINE HCL 25 MG PO CAPS
ORAL_CAPSULE | ORAL | Status: AC
  Filled 2014-12-03: qty 1

## 2014-12-03 MED ORDER — DIPHENHYDRAMINE HCL 25 MG PO CAPS
25.0000 mg | ORAL_CAPSULE | Freq: Once | ORAL | Status: AC
  Administered 2014-12-03: 25 mg via ORAL

## 2014-12-03 NOTE — Progress Notes (Signed)
Dr. Alvy Bimler made aware of patient's blood pressure before administering lasix 20 mg after the first unit of blood. Administer 20 mg of Lasix per order per Dr. Alvy Bimler.  Patient's 2nd unit of blood does not have CMV on the blood bag, CMV negative noted on the blood slip. Blood bank notified. Ok to transfuse per [place] in the blood bank.

## 2014-12-03 NOTE — Progress Notes (Signed)
Pt's wife [name] [name] requested chaplain via Darryll Capers at welcome desk.  Provided space for [name] [name] to share and process her concerns and feelings about loss of control (small picture:  distress when pt requests food outside of his diabetic/low-sodium diet; big picture: questions about how long to continue blood tx vs comfort).  Served as witness to her story, fears, and stressors--as both wife and caregiver.  Though [name] [name] is in nursing care at [place] accompanies him to all of his appointments.  This time commitment, plus the stress of organizing wheelchair-suitable transportation within [facility]' limited [place] hours, is wearing.  [name] [name] was visibly relieved at close of encounter and verbalized gratitude for emotional support, especially knowing that she could request and receive almost immediate care for herself.  She and children [name] [name] and [name] [name] plan to continue to family discernment about how best and most appropriately to support [name] [name] at his age and level of health.   Following for support, but please also page as needs arise.  Thank you.  [name] [name], [name]

## 2014-12-03 NOTE — Patient Instructions (Signed)

## 2014-12-04 LAB — TYPE AND SCREEN
ABO/RH(D): O POS
Antibody Screen: NEGATIVE
UNIT DIVISION: 0
Unit division: 0

## 2014-12-06 ENCOUNTER — Non-Acute Institutional Stay (SKILLED_NURSING_FACILITY): Payer: Medicare Other | Admitting: Nurse Practitioner

## 2014-12-06 ENCOUNTER — Encounter: Payer: Self-pay | Admitting: Nurse Practitioner

## 2014-12-06 DIAGNOSIS — N189 Chronic kidney disease, unspecified: Secondary | ICD-10-CM

## 2014-12-06 DIAGNOSIS — I509 Heart failure, unspecified: Secondary | ICD-10-CM

## 2014-12-06 DIAGNOSIS — E039 Hypothyroidism, unspecified: Secondary | ICD-10-CM | POA: Diagnosis not present

## 2014-12-06 DIAGNOSIS — N183 Chronic kidney disease, stage 3 (moderate): Secondary | ICD-10-CM

## 2014-12-06 DIAGNOSIS — F329 Major depressive disorder, single episode, unspecified: Secondary | ICD-10-CM | POA: Diagnosis not present

## 2014-12-06 DIAGNOSIS — E1122 Type 2 diabetes mellitus with diabetic chronic kidney disease: Secondary | ICD-10-CM | POA: Diagnosis not present

## 2014-12-06 DIAGNOSIS — F0390 Unspecified dementia without behavioral disturbance: Secondary | ICD-10-CM

## 2014-12-06 DIAGNOSIS — E875 Hyperkalemia: Secondary | ICD-10-CM | POA: Diagnosis not present

## 2014-12-06 DIAGNOSIS — D696 Thrombocytopenia, unspecified: Secondary | ICD-10-CM

## 2014-12-06 DIAGNOSIS — K269 Duodenal ulcer, unspecified as acute or chronic, without hemorrhage or perforation: Secondary | ICD-10-CM | POA: Diagnosis not present

## 2014-12-06 DIAGNOSIS — D469 Myelodysplastic syndrome, unspecified: Secondary | ICD-10-CM | POA: Diagnosis not present

## 2014-12-06 DIAGNOSIS — I10 Essential (primary) hypertension: Secondary | ICD-10-CM | POA: Diagnosis not present

## 2014-12-06 DIAGNOSIS — F32A Depression, unspecified: Secondary | ICD-10-CM

## 2014-12-06 DIAGNOSIS — D631 Anemia in chronic kidney disease: Secondary | ICD-10-CM

## 2014-12-06 NOTE — Assessment & Plan Note (Signed)
Levothyroxine 55mcg, TSH 2.55 12/01/13.  01/12/14 TSH 2.83 07/28/14 TSH 2.243

## 2014-12-06 NOTE — Assessment & Plan Note (Signed)
11/16/14 Hg 8.7

## 2014-12-06 NOTE — Assessment & Plan Note (Signed)
Controlled on Metoprolol 25mg  bid. off Losartan due to CKD. Creatinine 1.5-2.0.

## 2014-12-06 NOTE — Assessment & Plan Note (Signed)
takes Furosemide 40mg  and Spironolactone 25mg  daily, worsened BLE edema-but no increased SOB, cough, sputum production, or O2 desaturation. CXR 06/29/13 showed no pulmonary vascular congestion. Continue to observe.

## 2014-12-06 NOTE — Assessment & Plan Note (Signed)
stable, continue Cymbalta 60mg  daily.

## 2014-12-06 NOTE — Assessment & Plan Note (Signed)
07/22/15 K 4.2

## 2014-12-06 NOTE — Assessment & Plan Note (Signed)
No change, off Namenda R>B in setting of bone marrow dysplasia . Continue SNF for care needs

## 2014-12-06 NOTE — Assessment & Plan Note (Signed)
Stable, takes Omeprazole 20mg  daily, no c/o stomach pain, indigestion, or melena.

## 2014-12-06 NOTE — Assessment & Plan Note (Signed)
Creatinine 1.5-2.0

## 2014-12-06 NOTE — Assessment & Plan Note (Signed)
Creatinine near 2

## 2014-12-06 NOTE — Progress Notes (Signed)
Patient ID: [name] [name], male   DOB: 02/13/1920, 81 y.o.   MRN: [id number]   Code Status: DNR  Allergies  Allergen Reactions  . Enalapril Other (See Comments)    cough  . Metformin And Related Diarrhea  . Milk-Related Compounds Other (See Comments)    Doesn't remember but its a mild reaction    Chief Complaint  Patient presents with  . Medical Management of Chronic Issues  . Acute Visit    BLE edema    HPI: Patient is a 81 y.o. male seen in the SNF at [facility] today for evaluation of BLE edema and chronic medical conditions.  Problem List Items Addressed This Visit    Duodenal ulcer - Primary    Stable, takes Omeprazole 20mg  daily, no c/o stomach pain, indigestion, or melena.         CHF (congestive heart failure)    takes Furosemide 40mg  and Spironolactone 25mg  daily, worsened BLE edema-but no increased SOB, cough, sputum production, or O2 desaturation. CXR 06/29/13 showed no pulmonary vascular congestion. Continue to observe.         CKD (chronic kidney disease)    Creatinine 1.5-2.0        Type 2 diabetes mellitus with diabetic chronic kidney disease    Creatinine near 2      Depression    stable, continue Cymbalta 60mg  daily.          Dementia    No change, off Namenda R>B in setting of bone marrow dysplasia . Continue SNF for care needs        Essential hypertension, benign    Controlled on Metoprolol 25mg  bid. off Losartan due to CKD. Creatinine 1.5-2.0.       Hypothyroidism    Levothyroxine 76mcg, TSH 2.55 12/01/13.  01/12/14 TSH 2.83 07/28/14 TSH 2.243      Anemia in chronic kidney disease    11/16/14 Hg 8.7      Thrombocytopenia    11/02/14 Plt 72      Hyperkalemia    07/22/15 K 4.2      MDS (myelodysplastic syndrome)    11/16/14 Hgb 8.7         Review of Systems:  Review of Systems  Constitutional: Negative for fever, chills and diaphoresis.  HENT: Positive for hearing loss. Negative for congestion, ear  discharge, ear pain, nosebleeds, sore throat and tinnitus.   Eyes: Negative for photophobia, pain, discharge and redness.  Respiratory: Negative for cough, shortness of breath, wheezing and stridor.   Cardiovascular: Positive for leg swelling. Negative for chest pain and palpitations.       Edema 2+ BLE  Gastrointestinal: Negative for nausea, vomiting, abdominal pain, diarrhea, constipation and blood in stool.  Endocrine: Negative for polydipsia.  Genitourinary: Positive for frequency. Negative for dysuria, urgency, hematuria and flank pain.  Musculoskeletal: Negative for myalgias, back pain and neck pain.  Skin: Negative for rash.       Slightly warmth from worsened edema BLE  Allergic/Immunologic: Negative for environmental allergies.  Neurological: Negative for dizziness, tremors, seizures, weakness and headaches.  Hematological: Does not bruise/bleed easily.  Psychiatric/Behavioral: Negative for suicidal ideas and hallucinations. The patient is nervous/anxious.      Past Medical History  Diagnosis Date  . Hypothyroidism   . Hypertension   . CHF (congestive heart failure)   . Diabetes mellitus   . Coronary artery disease   . Restless leg syndrome   . Hyperlipemia   . Renal disease  stage II  . Sleep apnea   . Cellulitis and abscess of leg   . Obesity   . Duodenal ulcer, perforated   . Diverticulosis   . Internal hemorrhoids   . CAD (coronary artery disease)   . Anemia   . Duodenal ulcer   . Allergy   . Blood transfusion   . Cataract   . Tuberculosis     as a child  . Contact dermatitis and other eczema, due to unspecified cause 10/06/2012  . Senile dementia with depressive features 05/26/2012  . Hyposmolality and/or hyponatremia 04/02/2012  . Diarrhea 03/26/2012  . Hemorrhage of rectum and anus 03/25/2012  . Pneumonia, organism unspecified 03/12/2012  . 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 Folate-deficiency anemia 01/07/2012  .  Thrombocytopenia, unspecified 09/26/2011  . Loss of weight 09/11/2011  . Dysphagia, unspecified(787.20) 06/28/2011  . Chronic kidney disease, stage III (moderate) 03/22/2011  . Cramp of limb 08/10/2010  . Chronic kidney disease, stage II (mild) 03/22/2011  . Personal history of fall 05/05/2009  . Unspecified vitamin D deficiency 02/24/2009  . Blood in stool 11/18/2008  . Dermatophytosis of the body 06/23/2007  . Obesity, unspecified 06/23/2007  . Unspecified tinnitus 06/23/2007  . Edema 06/23/2007  . Shortness of breath 06/23/2007  . Flatulence, eructation, and gas pain 06/23/2007  . Other general symptoms(780.99) 06/23/2007  . Memory loss 08/13/2003  . Screening PSA (prostate specific antigen) 07/27/2014  . Prostate cancer screening 07/27/2014  . Weight loss 07/27/2014   Past Surgical History  Procedure Laterality Date  . Laparotomy  09/12/2011    Procedure: EXPLORATORY LAPAROTOMY;  Surgeon: Judieth Keens, DO;  Location: [facility];  Service: General;  Laterality: N/A;  REPAIR PERFORATED ULCER  . Gastrostomy  09/12/2011    Procedure: GASTROSTOMY;  Surgeon: Judieth Keens, DO;  Location: [facility];  Service: General;  Laterality: N/A;  BIOPSY DUODENAL ULCER  . Shoulder surgery  [place], [place]  . Jejunostomy feeding tube      removed  . Back surgery  1942  . Eye surgery  [phone]    cataracts  . Coronary artery bypass graft  2201   Social History:   reports that he has quit smoking. His smoking use included Cigarettes. He has never used smokeless tobacco. He reports that he does not drink alcohol or use illicit drugs.  Family History  Problem Relation Age of Onset  . Stroke Father     Medications: Patient's Medications  New Prescriptions   No medications on file  Previous Medications   ACETAMINOPHEN (TYLENOL) 325 MG TABLET    Take 650 mg by mouth every 4 (four) hours as needed (for pain).    DARBEPOETIN ALFA-POLYSORBATE (ARANESP, ALBUMIN FREE,) 500 MCG/ML  INJECTION    Inject 500 mcg into the skin every 30 (thirty) days. Gets inject at [facility]   DULOXETINE (CYMBALTA) 60 MG CAPSULE    Take 60 mg by mouth daily.   FLUTICASONE-SALMETEROL (ADVAIR HFA) 45-21 MCG/ACT INHALER    Inhale 2 puffs into the lungs every 12 (twelve) hours.    FOLIC ACID (FOLVITE) 1 MG TABLET    Take 1 mg by mouth daily.    FUROSEMIDE (LASIX) 40 MG TABLET    Take 40 mg by mouth daily.   INSULIN ASPART (NOVOLOG) 100 UNIT/ML INJECTION    Inject 5 Units into the skin 3 (three) times daily before meals. For CBG>150   INSULIN GLARGINE (LANTUS) 100 UNIT/ML INJECTION  Inject 54 Units into the skin at bedtime.   LEVOTHYROXINE (SYNTHROID, LEVOTHROID) 75 MCG TABLET    Take 75 mcg by mouth daily.    METOPROLOL TARTRATE (LOPRESSOR) 25 MG TABLET    Take 25 mg by mouth 2 (two) times daily.   OMEPRAZOLE (PRILOSEC) 20 MG CAPSULE    Take 1 capsule (20 mg total) by mouth daily.   SPIRONOLACTONE (ALDACTONE) 25 MG TABLET    Take 25 mg by mouth daily.   TIOTROPIUM (SPIRIVA) 18 MCG INHALATION CAPSULE    Place 18 mcg into inhaler and inhale daily. 2 puffs daily   VITAMIN B-12 (CYANOCOBALAMIN) 1000 MCG TABLET    Take 1,000 mcg by mouth daily.  Modified Medications   No medications on file  Discontinued Medications   No medications on file     Physical Exam: Physical Exam  Constitutional: He is oriented to person, place, and time. He appears well-developed and well-nourished. No distress.  HENT:  Head: Normocephalic and atraumatic.  Right Ear: External ear normal.  Left Ear: External ear normal.  Nose: Nose normal.  Mouth/Throat: Oropharynx is clear and moist. No oropharyngeal exudate.  Eyes: Conjunctivae and EOM are normal. Pupils are equal, round, and reactive to light.  Neck: Normal range of motion. Neck supple. No JVD present. No thyromegaly present.  Cardiovascular: Normal rate, regular rhythm and normal heart sounds.   No murmur heard. Pulmonary/Chest: Effort normal. No respiratory  distress. He has no wheezes. He has rales in the right lower field and the left lower field. He exhibits no tenderness.  Bibasilar as prior.   Abdominal: Soft. Bowel sounds are normal. He exhibits no distension and no mass. There is no tenderness. There is no rebound.  Musculoskeletal: Normal range of motion. He exhibits edema (trace in BLE). He exhibits no tenderness.  2+ edema BLE  Lymphadenopathy:    He has no cervical adenopathy.  Neurological: He is alert and oriented to person, place, and time. He has normal reflexes. No cranial nerve deficit. Coordination normal.  Skin: Skin is warm and dry. No rash noted. He is not diaphoretic. No erythema.  Small open area right buttock.  Psychiatric: Thought content normal. His mood appears anxious. His affect is angry and inappropriate. His affect is not blunt and not labile. His speech is not delayed, not tangential and not slurred. He is agitated and aggressive. He is not hyperactive, not slowed, not withdrawn, not actively hallucinating and not combative. Cognition and memory are impaired. He exhibits a depressed mood. He is communicative. He exhibits abnormal recent memory. He is attentive.    Filed Vitals:   12/06/14 1541  BP: 130/80  Pulse: 74  Temp: 97.8 F (36.6 C)  TempSrc: Tympanic  Resp: 20      Labs reviewed: Basic Metabolic Panel:  Recent Labs  01/12/14  04/08/14  07/28/14 07/28/14 1035 08/11/14 0926 09/22/14 0905  NA 135*  < > 134*  < >  --  135* 134* 135*  K 4.5  < > 4.5  < >  --  5.3* 4.6 5.6*  CO2  --   < >  --   < >  --  25 25 25   GLUCOSE  --   < >  --   < >  --  185* 154* 210*  BUN 46*  < > 35*  < >  --  39.5* 41.3* 34.4*  CREATININE 1.9*  < > 1.5*  < >  --  1.7* 1.7* 1.7*  CALCIUM  --   < >  --   < >  --  8.9 8.6 9.0  TSH 2.83  --  2.51  --  2.24  --   --   --   < > = values in this interval not displayed. Liver Function Tests:  Recent Labs  07/28/14 1035 08/11/14 0926 09/22/14 0905  AST 9 12 10   ALT 8  9 11   ALKPHOS 87 100 109  BILITOT 0.42 0.37 0.39  PROT 6.4 6.8 6.2*  ALBUMIN 2.9* 3.3* 3.1*   No results for input(s): LIPASE, AMYLASE in the last 8760 hours. No results for input(s): AMMONIA in the last 8760 hours. CBC:  Recent Labs  10/22/14 0915  11/05/14 1112 11/16/14 11/30/14 12/03/14 1005  WBC 4.3  < > 4.9 5.4 4.3 4.7  NEUTROABS 2.8  --  3.2  --   --  3.1  HGB 7.6*  < > 8.6* 8.7* 7.5* 7.8*  HCT 22.8*  < > 25.5* 26* 22* 23.0*  MCV 112.3*  --  109.0*  --   --  106.5*  PLT 76*  < > 94* 68* 80* 82*  < > = values in this interval not displayed. Lipid Panel: No results for input(s): CHOL, HDL, LDLCALC, TRIG, CHOLHDL, LDLDIRECT in the last 8760 hours.  Past Procedures:  CXR 06/29/13 showed there is mild patchy atelectasis or interstitial pneumonitis at the right lung base appearing significantly improved.  Assessment/Plan Duodenal ulcer Stable, takes Omeprazole 20mg  daily, no c/o stomach pain, indigestion, or melena.      CHF (congestive heart failure) takes Furosemide 40mg  and Spironolactone 25mg  daily, worsened BLE edema-but no increased SOB, cough, sputum production, or O2 desaturation. CXR 06/29/13 showed no pulmonary vascular congestion. Continue to observe.      CKD (chronic kidney disease) Creatinine 1.5-2.0     Type 2 diabetes mellitus with diabetic chronic kidney disease Creatinine near 2   Depression stable, continue Cymbalta 60mg  daily.       Dementia No change, off Namenda R>B in setting of bone marrow dysplasia . Continue SNF for care needs     Essential hypertension, benign Controlled on Metoprolol 25mg  bid. off Losartan due to CKD. Creatinine 1.5-2.0.    Hypothyroidism Levothyroxine 47mcg, TSH 2.55 12/01/13.  01/12/14 TSH 2.83 07/28/14 TSH 2.243   Anemia in chronic kidney disease 11/16/14 Hg 8.7   Thrombocytopenia 11/02/14 Plt 72   Hyperkalemia 07/22/15 K 4.2   MDS (myelodysplastic syndrome) 11/16/14 Hgb 8.7     Family/  Staff Communication: observe the patient  Goals of Care: SNF  Labs/tests ordered: none

## 2014-12-06 NOTE — Assessment & Plan Note (Signed)
11/16/14 Hgb 8.7

## 2014-12-06 NOTE — Assessment & Plan Note (Signed)
11/02/14 Plt 72

## 2014-12-14 ENCOUNTER — Telehealth: Payer: Self-pay | Admitting: *Deleted

## 2014-12-14 LAB — CBC AND DIFFERENTIAL
HCT: 26 % — AB (ref 41–53)
Hemoglobin: 8.8 g/dL — AB (ref 13.5–17.5)
Platelets: 70 10*3/uL — AB (ref 150–399)
WBC: 4 10*3/mL

## 2014-12-14 NOTE — Telephone Encounter (Signed)
Received CBC from [facility].  Hgb 8.8.   Dr. Alvy Bimler reviewed and states no need for transfusion.  Recheck CBC in 2 weeks as ordered.  LVM for pt's wife informing her of this and asked her to call nurse back if any questions or concerns.

## 2014-12-15 ENCOUNTER — Other Ambulatory Visit: Payer: Self-pay | Admitting: Nurse Practitioner

## 2014-12-15 DIAGNOSIS — D696 Thrombocytopenia, unspecified: Secondary | ICD-10-CM

## 2014-12-15 DIAGNOSIS — D469 Myelodysplastic syndrome, unspecified: Secondary | ICD-10-CM

## 2014-12-28 ENCOUNTER — Other Ambulatory Visit: Payer: Self-pay | Admitting: Hematology and Oncology

## 2014-12-28 ENCOUNTER — Telehealth: Payer: Self-pay | Admitting: Hematology and Oncology

## 2014-12-28 ENCOUNTER — Telehealth: Payer: Self-pay | Admitting: *Deleted

## 2014-12-28 DIAGNOSIS — D630 Anemia in neoplastic disease: Secondary | ICD-10-CM

## 2014-12-28 LAB — CBC AND DIFFERENTIAL
HCT: 23 % — AB (ref 41–53)
HEMOGLOBIN: 7.7 g/dL — AB (ref 13.5–17.5)
Platelets: 60 10*3/uL — AB (ref 150–399)
WBC: 4.5 10^3/mL

## 2014-12-28 NOTE — Telephone Encounter (Signed)
Labs added for 06/03 type and cross per 05/31 POF, sent msg to add 2 units of blood same day and to forward msg back to RN she will contact pt's wife w/times.... KJ

## 2014-12-28 NOTE — Telephone Encounter (Signed)
CBC

## 2014-12-28 NOTE — Telephone Encounter (Signed)
CBC results received from [facility].  Hgb is 7.7.  Dr. Alvy Bimler ordered 2 units of blood for this Friday 6/3.  POF sent for lab for type and cross and transfusion appt,  Wife requests early in day d/t transportation issues.  Dr. Alvy Bimler reviewing pt's record and he has averaged transfusion about once a month recently.  She states pt can have CBC checked once monthly now on Tuesdays, next one due on 01/25/15.   New order faxed to [facility] for monthly CBC.   Notified wife of new order and will call back w/ transfusion appt for Friday.  She verbalized understanding.

## 2014-12-29 ENCOUNTER — Telehealth: Payer: Self-pay | Admitting: Hematology and Oncology

## 2014-12-29 ENCOUNTER — Telehealth: Payer: Self-pay | Admitting: *Deleted

## 2014-12-29 ENCOUNTER — Other Ambulatory Visit: Payer: Self-pay | Admitting: Nurse Practitioner

## 2014-12-29 DIAGNOSIS — D469 Myelodysplastic syndrome, unspecified: Secondary | ICD-10-CM

## 2014-12-29 NOTE — Telephone Encounter (Signed)
Notified wife of lab/transfusion appt on Friday 6/3 and also informed of new order for monthly labs by Nursing Home.  This is a change from every 2 weeks labs since pt has only needed transfusion about once a month for the past few months.  Wife verbalized understanding.   Faxed new order for monthly CBC next due on 6/28 to [facility] at fax [phone].

## 2014-12-29 NOTE — Telephone Encounter (Signed)
Schedule blood transfusion. Scheduler advised.

## 2014-12-31 ENCOUNTER — Ambulatory Visit (HOSPITAL_BASED_OUTPATIENT_CLINIC_OR_DEPARTMENT_OTHER): Payer: Medicare Other

## 2014-12-31 ENCOUNTER — Ambulatory Visit (HOSPITAL_COMMUNITY)
Admission: RE | Admit: 2014-12-31 | Discharge: 2014-12-31 | Disposition: A | Discharge status: Home / Self Care | Payer: Medicare Other | Source: Ambulatory Visit | Attending: Hematology | Admitting: Hematology

## 2014-12-31 ENCOUNTER — Ambulatory Visit (HOSPITAL_BASED_OUTPATIENT_CLINIC_OR_DEPARTMENT_OTHER): Payer: Medicare Other | Admitting: Nurse Practitioner

## 2014-12-31 ENCOUNTER — Encounter: Payer: Self-pay | Admitting: Nurse Practitioner

## 2014-12-31 ENCOUNTER — Other Ambulatory Visit (HOSPITAL_BASED_OUTPATIENT_CLINIC_OR_DEPARTMENT_OTHER): Payer: Medicare Other

## 2014-12-31 VITALS — BP 107/42 | HR 86 | Temp 98.7°F | Resp 22

## 2014-12-31 DIAGNOSIS — D630 Anemia in neoplastic disease: Secondary | ICD-10-CM | POA: Diagnosis not present

## 2014-12-31 DIAGNOSIS — R05 Cough: Secondary | ICD-10-CM

## 2014-12-31 DIAGNOSIS — D469 Myelodysplastic syndrome, unspecified: Secondary | ICD-10-CM

## 2014-12-31 DIAGNOSIS — J40 Bronchitis, not specified as acute or chronic: Secondary | ICD-10-CM

## 2014-12-31 LAB — CBC WITH DIFFERENTIAL/PLATELET
BASO%: 0 % (ref 0.0–2.0)
BASOS ABS: 0 10*3/uL (ref 0.0–0.1)
EOS%: 2.7 % (ref 0.0–7.0)
Eosinophils Absolute: 0.2 10*3/uL (ref 0.0–0.5)
HCT: 23.6 % — ABNORMAL LOW (ref 38.4–49.9)
HGB: 7.9 g/dL — ABNORMAL LOW (ref 13.0–17.1)
LYMPH#: 1.1 10*3/uL (ref 0.9–3.3)
LYMPH%: 18.9 % (ref 14.0–49.0)
MCH: 35.1 pg — AB (ref 27.2–33.4)
MCHC: 33.5 g/dL (ref 32.0–36.0)
MCV: 104.9 fL — ABNORMAL HIGH (ref 79.3–98.0)
MONO#: 0.3 10*3/uL (ref 0.1–0.9)
MONO%: 5.4 % (ref 0.0–14.0)
NEUT#: 4.1 10*3/uL (ref 1.5–6.5)
NEUT%: 73 % (ref 39.0–75.0)
NRBC: 0 % (ref 0–0)
Platelets: 72 10*3/uL — ABNORMAL LOW (ref 140–400)
RBC: 2.25 10*6/uL — ABNORMAL LOW (ref 4.20–5.82)
RDW: 24 % — AB (ref 11.0–14.6)
WBC: 5.6 10*3/uL (ref 4.0–10.3)

## 2014-12-31 LAB — PREPARE RBC (CROSSMATCH)

## 2014-12-31 LAB — HOLD TUBE, BLOOD BANK

## 2014-12-31 MED ORDER — DIPHENHYDRAMINE HCL 25 MG PO CAPS
25.0000 mg | ORAL_CAPSULE | Freq: Once | ORAL | Status: AC
  Administered 2014-12-31: 25 mg via ORAL

## 2014-12-31 MED ORDER — ACETAMINOPHEN 325 MG PO TABS
ORAL_TABLET | ORAL | Status: AC
  Filled 2014-12-31: qty 2

## 2014-12-31 MED ORDER — SODIUM CHLORIDE 0.9 % IV SOLN
250.0000 mL | Freq: Once | INTRAVENOUS | Status: AC
  Administered 2014-12-31: 250 mL via INTRAVENOUS

## 2014-12-31 MED ORDER — FUROSEMIDE 10 MG/ML IJ SOLN: 20.0000 mg | Freq: Once | INTRAMUSCULAR | Status: DC

## 2014-12-31 MED ORDER — AZITHROMYCIN 250 MG PO TABS: ORAL_TABLET | ORAL | Status: DC

## 2014-12-31 MED ORDER — DIPHENHYDRAMINE HCL 25 MG PO CAPS
ORAL_CAPSULE | ORAL | Status: AC
  Filled 2014-12-31: qty 1

## 2014-12-31 MED ORDER — ACETAMINOPHEN 325 MG PO TABS
650.0000 mg | ORAL_TABLET | Freq: Once | ORAL | Status: AC
Start: 2014-12-31 — End: 2014-12-31
  Administered 2014-12-31: 650 mg via ORAL

## 2014-12-31 NOTE — Assessment & Plan Note (Addendum)
WBC 5.6, ANC 4.1, hemoglobin 7.9, and platelet count 72 today.  Patient is scheduled to receive 2 units packed red blood cells today.  Will hold any further Lasix today; since confirmed the patient did receive Lasix 40 mg while at the nursing facility earlier this morning.  He is no longer receiving Aranesp injection; and is undergoing observation only with transfusional support as needed.  Patient is scheduled to obtain labs at his skilled nursing facility on 01/25/2015.  Patient is scheduled to return for labs, follow up visit, and possible blood products on 03/24/2015.

## 2014-12-31 NOTE — Patient Instructions (Signed)

## 2014-12-31 NOTE — Progress Notes (Signed)
1100am  Selena Lesser NP here to see patient and assess productive cough. Script for zithromax given to wife to take to [facility]. At discharge - reviewed with wife to give script to [facility].  Reviewed with patient and wife importance of drinking plenty of fluids..  Both patient and wife expressed appreciation for our care here today.

## 2014-12-31 NOTE — Progress Notes (Signed)
SYMPTOM MANAGEMENT CLINIC   HPI: [name] [name] 81 y.o. male diagnosed with MDS.  Patient is status post Aranesp injections.  Patient is currently undergoing observation only; with transfusional support on an as-needed basis.   Patient is complaining of a one to two-week history of a congested, productive cough of yellow secretions.  He denies any wheezing.  He denies any shortness of breath.  He also denies any recent fevers or chills.  HPI  ROS  Past Medical History  Diagnosis Date  . Hypothyroidism   . Hypertension   . CHF (congestive heart failure)   . Diabetes mellitus   . Coronary artery disease   . Restless leg syndrome   . Hyperlipemia   . Renal disease     stage II  . Sleep apnea   . Cellulitis and abscess of leg   . Obesity   . Duodenal ulcer, perforated   . Diverticulosis   . Internal hemorrhoids   . CAD (coronary artery disease)   . Anemia   . Duodenal ulcer   . Allergy   . Blood transfusion   . Cataract   . Tuberculosis     as a child  . Contact dermatitis and other eczema, due to unspecified cause 10/06/2012  . Senile dementia with depressive features 05/26/2012  . Hyposmolality and/or hyponatremia 04/02/2012  . Diarrhea 03/26/2012  . Hemorrhage of rectum and anus 03/25/2012  . Pneumonia, organism unspecified 03/12/2012  . 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 Folate-deficiency anemia 01/07/2012  . Thrombocytopenia, unspecified 09/26/2011  . Loss of weight 09/11/2011  . Dysphagia, unspecified(787.20) 06/28/2011  . Chronic kidney disease, stage III (moderate) 03/22/2011  . Cramp of limb 08/10/2010  . Chronic kidney disease, stage II (mild) 03/22/2011  . Personal history of fall 05/05/2009  . Unspecified vitamin D deficiency 02/24/2009  . Blood in stool 11/18/2008  . Dermatophytosis of the body 06/23/2007  . Obesity, unspecified 06/23/2007  . Unspecified tinnitus 06/23/2007  . Edema 06/23/2007  . Shortness of  breath 06/23/2007  . Flatulence, eructation, and gas pain 06/23/2007  . Other general symptoms(780.99) 06/23/2007  . Memory loss 08/13/2003  . Screening PSA (prostate specific antigen) 07/27/2014  . Prostate cancer screening 07/27/2014  . Weight loss 07/27/2014    Past Surgical History  Procedure Laterality Date  . Laparotomy  09/12/2011    Procedure: EXPLORATORY LAPAROTOMY;  Surgeon: Judieth Keens, DO;  Location: [facility];  Service: General;  Laterality: N/A;  REPAIR PERFORATED ULCER  . Gastrostomy  09/12/2011    Procedure: GASTROSTOMY;  Surgeon: Judieth Keens, DO;  Location: [facility];  Service: General;  Laterality: N/A;  BIOPSY DUODENAL ULCER  . Shoulder surgery  [place], [place]  . Jejunostomy feeding tube      removed  . Back surgery  1942  . Eye surgery  [phone]    cataracts  . Coronary artery bypass graft  2201    has Anemia; BLOOD IN STOOL; Duodenal ulcer; CHF (congestive heart failure); CKD (chronic kidney disease); Respiratory failure; Type 2 diabetes mellitus with diabetic chronic kidney disease; Fall at nursing home; Depression; Dementia; Essential hypertension, benign; Hypothyroidism; Anemia in chronic kidney disease; Unspecified gastritis and gastroduodenitis without mention of hemorrhage; Urinary tract infection, site not specified; Thrombocytopenia; Hyperkalemia; Foot pain, left; Closed nondisplaced fracture of fifth left metatarsal bone; MDS (myelodysplastic syndrome); Complaint of debility and malaise; Screening PSA (prostate specific antigen); Prostate cancer screening; Weight loss; and Bronchitis on his  problem list.    is allergic to enalapril; metformin and related; and milk-related compounds.    Medication List       This list is accurate as of: 12/31/14  1:18 PM.  Always use your most recent med list.               acetaminophen 325 MG tablet  Commonly known as:  TYLENOL  Take 650 mg by mouth every 4 (four) hours as needed (for pain).      ARANESP (ALBUMIN FREE) 500 MCG/ML injection  Generic drug:  darbepoetin alfa-polysorbate  Inject 500 mcg into the skin every 30 (thirty) days. Gets inject at [facility]     DULoxetine 60 MG capsule  Commonly known as:  CYMBALTA  Take 60 mg by mouth daily.     fluticasone-salmeterol 45-21 MCG/ACT inhaler  Commonly known as:  ADVAIR HFA  Inhale 2 puffs into the lungs every 12 (twelve) hours.     folic acid 1 MG tablet  Commonly known as:  FOLVITE  Take 1 mg by mouth daily.     furosemide 40 MG tablet  Commonly known as:  LASIX  Take 40 mg by mouth daily.     insulin aspart 100 UNIT/ML injection  Commonly known as:  novoLOG  Inject 5 Units into the skin 3 (three) times daily before meals. For CBG>150     insulin glargine 100 UNIT/ML injection  Commonly known as:  LANTUS  Inject 54 Units into the skin at bedtime.     levothyroxine 75 MCG tablet  Commonly known as:  SYNTHROID, LEVOTHROID  Take 75 mcg by mouth daily.     metoprolol tartrate 25 MG tablet  Commonly known as:  LOPRESSOR  Take 25 mg by mouth 2 (two) times daily.     omeprazole 20 MG capsule  Commonly known as:  PRILOSEC  Take 1 capsule (20 mg total) by mouth daily.     spironolactone 25 MG tablet  Commonly known as:  ALDACTONE  Take 25 mg by mouth daily.     tiotropium 18 MCG inhalation capsule  Commonly known as:  SPIRIVA  Place 18 mcg into inhaler and inhale daily. 2 puffs daily     vitamin B-12 1000 MCG tablet  Commonly known as:  CYANOCOBALAMIN  Take 1,000 mcg by mouth daily.         PHYSICAL EXAMINATION  Oncology Vitals 12/31/2014 12/31/2014 12/31/2014 12/31/2014 12/06/2014 12/03/2014 12/03/2014  Height - - - - - - -  Weight - - - - - - -  Weight (lbs) - - - - - - -  BMI (kg/m2) - - - - - - -  Temp 98.5 98.6 98.2 98.2 97.8 98.3 97.9  Pulse - 79 70 82 74 72 84  Resp 20 20 22 18 20 18 18   SpO2 - 98 97 100 - - -  BSA (m2) - - - - - - -   BP Readings from Last 3 Encounters:  12/31/14 86/44  12/06/14 130/80    12/03/14 108/50    Physical Exam  Constitutional: He is oriented to person, place, and time and well-developed, well-nourished, and in no distress.  HENT:  Head: Normocephalic and atraumatic.  Eyes: Conjunctivae and EOM are normal. Pupils are equal, round, and reactive to light. Right eye exhibits no discharge. Left eye exhibits no discharge. No scleral icterus.  Neck: Normal range of motion. Neck supple. No JVD present. No tracheal deviation present. No thyromegaly present.  Cardiovascular: Normal  rate, regular rhythm, normal heart sounds and intact distal pulses.   Pulmonary/Chest: Effort normal and breath sounds normal. No stridor. No respiratory distress. He has no wheezes. He has no rales. He exhibits no tenderness.  Patient with congested cough on exam.  No acute respiratory distress.  Abdominal: Soft. Bowel sounds are normal. He exhibits no distension and no mass. There is no tenderness. There is no rebound and no guarding.  Musculoskeletal: Normal range of motion. He exhibits no edema or tenderness.  Lymphadenopathy:    He has no cervical adenopathy.  Neurological: He is alert and oriented to person, place, and time.  Skin: Skin is warm and dry. No rash noted. No erythema. There is pallor.  Psychiatric: Affect normal.  Nursing note and vitals reviewed.   LABORATORY DATA:. Appointment on 12/31/2014  Component Date Value Ref Range Status  . WBC 12/31/2014 5.6  4.0 - 10.3 10e3/uL Final  . NEUT# 12/31/2014 4.1  1.5 - 6.5 10e3/uL Final  . HGB 12/31/2014 7.9* 13.0 - 17.1 g/dL Final  . HCT 12/31/2014 23.6* 38.4 - 49.9 % Final  . Platelets 12/31/2014 72* 140 - 400 10e3/uL Final  . MCV 12/31/2014 104.9* 79.3 - 98.0 fL Final  . MCH 12/31/2014 35.1* 27.2 - 33.4 pg Final  . MCHC 12/31/2014 33.5  32.0 - 36.0 g/dL Final  . RBC 12/31/2014 2.25* 4.20 - 5.82 10e6/uL Final  . RDW 12/31/2014 24.0* 11.0 - 14.6 % Final  . lymph# 12/31/2014 1.1  0.9 - 3.3 10e3/uL Final  . MONO# 12/31/2014 0.3   0.1 - 0.9 10e3/uL Final  . Eosinophils Absolute 12/31/2014 0.2  0.0 - 0.5 10e3/uL Final  . Basophils Absolute 12/31/2014 0.0  0.0 - 0.1 10e3/uL Final  . NEUT% 12/31/2014 73.0  39.0 - 75.0 % Final  . LYMPH% 12/31/2014 18.9  14.0 - 49.0 % Final  . MONO% 12/31/2014 5.4  0.0 - 14.0 % Final  . EOS% 12/31/2014 2.7  0.0 - 7.0 % Final  . BASO% 12/31/2014 0.0  0.0 - 2.0 % Final  . nRBC 12/31/2014 0  0 - 0 % Final  . Hold Tube, Blood Bank 12/31/2014 Type and Crossmatch Added   Final  Hospital Outpatient Visit on 12/31/2014  Component Date Value Ref Range Status  . Order Confirmation 12/31/2014 ORDER PROCESSED BY BLOOD BANK   Final  . ABO/RH(D) 12/31/2014 O POS   Final  . Antibody Screen 12/31/2014 NEG   Final  . Sample Expiration 12/31/2014 01/03/2015   Final  . Unit Number 12/31/2014 [id number]   Final  . Blood Component Type 12/31/2014 RBC, LR IRR   Final  . Unit division 12/31/2014 00   Final  . Status of Unit 12/31/2014 ISSUED   Final  . Transfusion Status 12/31/2014 OK TO TRANSFUSE   Final  . Crossmatch Result 12/31/2014 Compatible   Final  . Unit Number 12/31/2014 [id number]   Final  . Blood Component Type 12/31/2014 RBC, LR IRR   Final  . Unit division 12/31/2014 00   Final  . Status of Unit 12/31/2014 ISSUED   Final  . Transfusion Status 12/31/2014 OK TO TRANSFUSE   Final  . Crossmatch Result 12/31/2014 Compatible   Final  Lab on 12/29/2014  Component Date Value Ref Range Status  . Hemoglobin 12/28/2014 7.7* 13.5 - 17.5 g/dL Final  . HCT 12/28/2014 23* 41 - 53 % Final  . Platelets 12/28/2014 60* 150 - 399 K/L Final  . WBC 12/28/2014 4.5   Final  RADIOGRAPHIC STUDIES: No results found.  ASSESSMENT/PLAN:    MDS (myelodysplastic syndrome) WBC 5.6, ANC 4.1, hemoglobin 7.9, and platelet count 72 today.  Patient is scheduled to receive 2 units packed red blood cells today.  Will hold any further Lasix today; since confirmed the patient did receive Lasix 40 mg while at the  nursing facility earlier this morning.  He is no longer receiving Aranesp injection; and is undergoing observation only with transfusional support as needed.  Patient is scheduled to obtain labs at his skilled nursing facility on 01/25/2015.  Patient is scheduled to return for labs, follow up visit, and possible blood products on 03/24/2015.   Bronchitis Patient is complaining of a one to two-week history of a congested, productive cough of yellow secretions.  He denies any wheezing.  He denies any shortness of breath.  He also denies any recent fevers or chills.  On exam.-Breath sounds clear bilaterally; with no wheezing.  Patient was noted to have a congested cough on occasion; was spitting yellow secretions into a cup while at the cancer center.  Will prescribe Zithromax for bronchitis symptoms.  Prescription will be written on returning paperwork for for patient's skilled nursing facility to fill.    Patient stated understanding of all instructions; and was in agreement with this plan of care. The patient knows to call the clinic with any problems, questions or concerns.   Review/collaboration with Dr. Alvy Bimler regarding all aspects of patient's visit today.   Total time spent with patient was 25 minutes;  with greater than 75 percent of that time spent in face to face counseling regarding patient's symptoms,  and coordination of care and follow up.  Disclaimer: This note was dictated with voice recognition software. Similar sounding words can inadvertently be transcribed and may not be corrected upon review.   Drue Second, NP 12/31/2014

## 2014-12-31 NOTE — Assessment & Plan Note (Signed)
Patient is complaining of a one to two-week history of a congested, productive cough of yellow secretions.  He denies any wheezing.  He denies any shortness of breath.  He also denies any recent fevers or chills.  On exam.-Breath sounds clear bilaterally; with no wheezing.  Patient was noted to have a congested cough on occasion; was spitting yellow secretions into a cup while at the cancer center.  Will prescribe Zithromax for bronchitis symptoms.  Prescription will be written on returning paperwork for for patient's skilled nursing facility to fill.

## 2015-01-02 LAB — TYPE AND SCREEN
ABO/RH(D): O POS
ANTIBODY SCREEN: NEGATIVE
Unit division: 0
Unit division: 0

## 2015-01-03 ENCOUNTER — Telehealth: Payer: Self-pay | Admitting: *Deleted

## 2015-01-03 NOTE — Telephone Encounter (Signed)
Called & left message on pt's vm to call back to let us know how he is doing since 01/30/15 NP visit with Selena Lesser.

## 2015-01-04 NOTE — Telephone Encounter (Signed)
TC from patient's wife in response to call from [facility] nurse yesterday.  She states [name] [name] is doing much better after blood transfusion last week. He is up playing board games and having more energy overall.  His cough is improving. He is no longer coughing up any yellow secretions-they are clear and his coughing is less frequent.  He will complete his Zpak tonight.

## 2015-01-05 ENCOUNTER — Encounter: Payer: Self-pay | Admitting: Nurse Practitioner

## 2015-01-05 ENCOUNTER — Non-Acute Institutional Stay (SKILLED_NURSING_FACILITY): Payer: Medicare Other | Admitting: Nurse Practitioner

## 2015-01-05 DIAGNOSIS — I10 Essential (primary) hypertension: Secondary | ICD-10-CM | POA: Diagnosis not present

## 2015-01-05 DIAGNOSIS — D696 Thrombocytopenia, unspecified: Secondary | ICD-10-CM | POA: Diagnosis not present

## 2015-01-05 DIAGNOSIS — E039 Hypothyroidism, unspecified: Secondary | ICD-10-CM | POA: Diagnosis not present

## 2015-01-05 DIAGNOSIS — E1122 Type 2 diabetes mellitus with diabetic chronic kidney disease: Secondary | ICD-10-CM

## 2015-01-05 DIAGNOSIS — D631 Anemia in chronic kidney disease: Secondary | ICD-10-CM

## 2015-01-05 DIAGNOSIS — J40 Bronchitis, not specified as acute or chronic: Secondary | ICD-10-CM | POA: Diagnosis not present

## 2015-01-05 DIAGNOSIS — N189 Chronic kidney disease, unspecified: Secondary | ICD-10-CM | POA: Diagnosis not present

## 2015-01-05 DIAGNOSIS — F32A Depression, unspecified: Secondary | ICD-10-CM

## 2015-01-05 DIAGNOSIS — I509 Heart failure, unspecified: Secondary | ICD-10-CM

## 2015-01-05 DIAGNOSIS — D469 Myelodysplastic syndrome, unspecified: Secondary | ICD-10-CM

## 2015-01-05 DIAGNOSIS — F329 Major depressive disorder, single episode, unspecified: Secondary | ICD-10-CM | POA: Diagnosis not present

## 2015-01-05 DIAGNOSIS — E875 Hyperkalemia: Secondary | ICD-10-CM

## 2015-01-05 DIAGNOSIS — N183 Chronic kidney disease, stage 3 (moderate): Secondary | ICD-10-CM | POA: Diagnosis not present

## 2015-01-05 DIAGNOSIS — F0390 Unspecified dementia without behavioral disturbance: Secondary | ICD-10-CM | POA: Diagnosis not present

## 2015-01-05 DIAGNOSIS — K269 Duodenal ulcer, unspecified as acute or chronic, without hemorrhage or perforation: Secondary | ICD-10-CM

## 2015-01-05 NOTE — Assessment & Plan Note (Signed)
12/14/14 Plt 70

## 2015-01-05 NOTE — Assessment & Plan Note (Signed)
No change, off Namenda R>B in setting of bone marrow dysplasia . Continue SNF for care needs

## 2015-01-05 NOTE — Assessment & Plan Note (Signed)
takes Furosemide 40mg  and Spironolactone 25mg  daily, worsened BLE edema-but no increased SOB, cough, sputum production, or O2 desaturation. CXR 06/29/13 showed no pulmonary vascular congestion. Continue to observe.

## 2015-01-05 NOTE — Assessment & Plan Note (Signed)
Controlled on Metoprolol 25mg  bid. off Losartan due to CKD. Creatinine 1.5-2.0.

## 2015-01-05 NOTE — Assessment & Plan Note (Signed)
12/28/14 Hgb 7.7, plt 60. F/u Oncology.

## 2015-01-05 NOTE — Assessment & Plan Note (Signed)
Completed Azithromycin. Persisted cough, afebrile, no O2 desaturation or chest pain. Will add Medrol dose pk and Neb x3 days for after infection symptomatic management.

## 2015-01-05 NOTE — Assessment & Plan Note (Signed)
Creatinine 1.5-2.0

## 2015-01-05 NOTE — Assessment & Plan Note (Signed)
Levothyroxine 10mcg, TSH 2.24 07/28/14

## 2015-01-05 NOTE — Assessment & Plan Note (Signed)
07/22/15 K 4.2

## 2015-01-05 NOTE — Assessment & Plan Note (Signed)
stable, continue Cymbalta 60mg  daily.

## 2015-01-05 NOTE — Progress Notes (Signed)
Patient ID: [name] [name], male   DOB: 07-10-1920, 81 y.o.   MRN: [id number]   Code Status: DNR  Allergies  Allergen Reactions  . Enalapril Other (See Comments)    cough  . Metformin And Related Diarrhea  . Milk-Related Compounds Other (See Comments)    Doesn't remember but its a mild reaction    Chief Complaint  Patient presents with  . Medical Management of Chronic Issues  . Acute Visit    cough    HPI: Patient is a 81 y.o. male seen in the SNF at [facility] today for evaluation of BLE edema and chronic medical conditions.  Problem List Items Addressed This Visit    Duodenal ulcer    Stable, takes Omeprazole 20mg  daily, no c/o stomach pain, indigestion, or melena.          CHF (congestive heart failure)    takes Furosemide 40mg  and Spironolactone 25mg  daily, worsened BLE edema-but no increased SOB, cough, sputum production, or O2 desaturation. CXR 06/29/13 showed no pulmonary vascular congestion. Continue to observe.          CKD (chronic kidney disease)    Creatinine 1.5-2.0        Type 2 diabetes mellitus with diabetic chronic kidney disease    Creatinine near 2Lantus 54 qd Novolog 12u for CBG >450 instead of 5u with meals.         Depression    stable, continue Cymbalta 60mg  daily.         Dementia    No change, off Namenda R>B in setting of bone marrow dysplasia . Continue SNF for care needs       Essential hypertension, benign    Controlled on Metoprolol 25mg  bid. off Losartan due to CKD. Creatinine 1.5-2.0.       Hypothyroidism    Levothyroxine 51mcg, TSH 2.24 07/28/14      Anemia in chronic kidney disease    09/22/14 [facility] Center: Hx of MDS and no longer responding to Aranesp-transfuse only in future. CBC Thr-transfusion Hgb<8 11/16/14 Hg 8.7-MDS       Thrombocytopenia    12/14/14 Plt 70       Hyperkalemia    07/22/15 K 4.2      MDS (myelodysplastic syndrome)    12/28/14 Hgb 7.7, plt 60. F/u Oncology.       Bronchitis - Primary    Completed Azithromycin. Persisted cough, afebrile, no O2 desaturation or chest pain. Will add Medrol dose pk and Neb x3 days for after infection symptomatic management.          Review of Systems:  Review of Systems  Constitutional: Negative for fever, chills and diaphoresis.  HENT: Positive for hearing loss. Negative for congestion, ear discharge, ear pain, nosebleeds, sore throat and tinnitus.   Eyes: Negative for photophobia, pain, discharge and redness.  Respiratory: Positive for cough. Negative for shortness of breath, wheezing and stridor.   Cardiovascular: Positive for leg swelling. Negative for chest pain and palpitations.       Edema 2+ BLE  Gastrointestinal: Negative for nausea, vomiting, abdominal pain, diarrhea, constipation and blood in stool.  Endocrine: Negative for polydipsia.  Genitourinary: Positive for frequency. Negative for dysuria, urgency, hematuria and flank pain.  Musculoskeletal: Negative for myalgias, back pain and neck pain.  Skin: Negative for rash.       Slightly warmth from worsened edema BLE  Allergic/Immunologic: Negative for environmental allergies.  Neurological: Negative for dizziness, tremors, seizures, weakness and headaches.  Hematological: Does  not bruise/bleed easily.  Psychiatric/Behavioral: Negative for suicidal ideas and hallucinations. The patient is nervous/anxious.      Past Medical History  Diagnosis Date  . Hypothyroidism   . Hypertension   . CHF (congestive heart failure)   . Diabetes mellitus   . Coronary artery disease   . Restless leg syndrome   . Hyperlipemia   . Renal disease     stage II  . Sleep apnea   . Cellulitis and abscess of leg   . Obesity   . Duodenal ulcer, perforated   . Diverticulosis   . Internal hemorrhoids   . CAD (coronary artery disease)   . Anemia   . Duodenal ulcer   . Allergy   . Blood transfusion   . Cataract   . Tuberculosis     as a child  . Contact dermatitis  and other eczema, due to unspecified cause 10/06/2012  . Senile dementia with depressive features 05/26/2012  . Hyposmolality and/or hyponatremia 04/02/2012  . Diarrhea 03/26/2012  . Hemorrhage of rectum and anus 03/25/2012  . Pneumonia, organism unspecified 03/12/2012  . 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 Folate-deficiency anemia 01/07/2012  . Thrombocytopenia, unspecified 09/26/2011  . Loss of weight 09/11/2011  . Dysphagia, unspecified(787.20) 06/28/2011  . Chronic kidney disease, stage III (moderate) 03/22/2011  . Cramp of limb 08/10/2010  . Chronic kidney disease, stage II (mild) 03/22/2011  . Personal history of fall 05/05/2009  . Unspecified vitamin D deficiency 02/24/2009  . Blood in stool 11/18/2008  . Dermatophytosis of the body 06/23/2007  . Obesity, unspecified 06/23/2007  . Unspecified tinnitus 06/23/2007  . Edema 06/23/2007  . Shortness of breath 06/23/2007  . Flatulence, eructation, and gas pain 06/23/2007  . Other general symptoms(780.99) 06/23/2007  . Memory loss 08/13/2003  . Screening PSA (prostate specific antigen) 07/27/2014  . Prostate cancer screening 07/27/2014  . Weight loss 07/27/2014   Past Surgical History  Procedure Laterality Date  . Laparotomy  09/12/2011    Procedure: EXPLORATORY LAPAROTOMY;  Surgeon: Judieth Keens, DO;  Location: [facility];  Service: General;  Laterality: N/A;  REPAIR PERFORATED ULCER  . Gastrostomy  09/12/2011    Procedure: GASTROSTOMY;  Surgeon: Judieth Keens, DO;  Location: [facility];  Service: General;  Laterality: N/A;  BIOPSY DUODENAL ULCER  . Shoulder surgery  [place], [place]  . Jejunostomy feeding tube      removed  . Back surgery  1942  . Eye surgery  [phone]    cataracts  . Coronary artery bypass graft  2201   Social History:   reports that he has quit smoking. His smoking use included Cigarettes. He has never used smokeless tobacco. He reports that he does not drink  alcohol or use illicit drugs.  Family History  Problem Relation Age of Onset  . Stroke Father     Medications: Patient's Medications  New Prescriptions   No medications on file  Previous Medications   ACETAMINOPHEN (TYLENOL) 325 MG TABLET    Take 650 mg by mouth every 4 (four) hours as needed (for pain).    AZITHROMYCIN (ZITHROMAX Z-PAK) 250 MG TABLET    Take 2 tabs (500 mg) PO on day # 1; then take 1 tab (250 mg) daily till gone.   DULOXETINE (CYMBALTA) 60 MG CAPSULE    Take 60 mg by mouth daily.   FLUTICASONE-SALMETEROL (ADVAIR HFA) 45-21 MCG/ACT INHALER    Inhale 2 puffs into the lungs every  12 (twelve) hours.    FOLIC ACID (FOLVITE) 1 MG TABLET    Take 1 mg by mouth daily.    FUROSEMIDE (LASIX) 40 MG TABLET    Take 40 mg by mouth daily.   INSULIN ASPART (NOVOLOG) 100 UNIT/ML INJECTION    Inject 5 Units into the skin 3 (three) times daily before meals. For CBG>150   INSULIN GLARGINE (LANTUS) 100 UNIT/ML INJECTION    Inject 54 Units into the skin at bedtime.   LEVOTHYROXINE (SYNTHROID, LEVOTHROID) 75 MCG TABLET    Take 75 mcg by mouth daily.    METOPROLOL TARTRATE (LOPRESSOR) 25 MG TABLET    Take 25 mg by mouth 2 (two) times daily.   OMEPRAZOLE (PRILOSEC) 20 MG CAPSULE    Take 1 capsule (20 mg total) by mouth daily.   SPIRONOLACTONE (ALDACTONE) 25 MG TABLET    Take 25 mg by mouth daily.   TIOTROPIUM (SPIRIVA) 18 MCG INHALATION CAPSULE    Place 18 mcg into inhaler and inhale daily. 2 puffs daily   VITAMIN B-12 (CYANOCOBALAMIN) 1000 MCG TABLET    Take 1,000 mcg by mouth daily.  Modified Medications   No medications on file  Discontinued Medications   No medications on file     Physical Exam: Physical Exam  Constitutional: He is oriented to person, place, and time. He appears well-developed and well-nourished. No distress.  HENT:  Head: Normocephalic and atraumatic.  Right Ear: External ear normal.  Left Ear: External ear normal.  Nose: Nose normal.  Mouth/Throat: Oropharynx  is clear and moist. No oropharyngeal exudate.  Eyes: Conjunctivae and EOM are normal. Pupils are equal, round, and reactive to light.  Neck: Normal range of motion. Neck supple. No JVD present. No thyromegaly present.  Cardiovascular: Normal rate, regular rhythm and normal heart sounds.   No murmur heard. Pulmonary/Chest: Effort normal. No respiratory distress. He has no wheezes. He has rales in the right lower field and the left lower field. He exhibits no tenderness.  rhonchi  Abdominal: Soft. Bowel sounds are normal. He exhibits no distension and no mass. There is no tenderness. There is no rebound.  Musculoskeletal: Normal range of motion. He exhibits edema (trace in BLE). He exhibits no tenderness.  2+ edema BLE  Lymphadenopathy:    He has no cervical adenopathy.  Neurological: He is alert and oriented to person, place, and time. He has normal reflexes. No cranial nerve deficit. Coordination normal.  Skin: Skin is warm and dry. No rash noted. He is not diaphoretic. No erythema.  Small open area right buttock.  Psychiatric: Thought content normal. His mood appears anxious. His affect is angry and inappropriate. His affect is not blunt and not labile. His speech is not delayed, not tangential and not slurred. He is agitated and aggressive. He is not hyperactive, not slowed, not withdrawn, not actively hallucinating and not combative. Cognition and memory are impaired. He exhibits a depressed mood. He is communicative. He exhibits abnormal recent memory. He is attentive.    Filed Vitals:   01/05/15 1243  BP: 130/60  Pulse: 76  Temp: 96.4 F (35.8 C)  TempSrc: Tympanic  Resp: 18      Labs reviewed: Basic Metabolic Panel:  Recent Labs  01/12/14  04/08/14  07/28/14 07/28/14 1035 08/11/14 0926 09/22/14 0905  NA 135*  < > 134*  < >  --  135* 134* 135*  K 4.5  < > 4.5  < >  --  5.3* 4.6 5.6*  CO2  --   < >  --   < >  --  25 25 25   GLUCOSE  --   < >  --   < >  --  185* 154* 210*    BUN 46*  < > 35*  < >  --  39.5* 41.3* 34.4*  CREATININE 1.9*  < > 1.5*  < >  --  1.7* 1.7* 1.7*  CALCIUM  --   < >  --   < >  --  8.9 8.6 9.0  TSH 2.83  --  2.51  --  2.24  --   --   --   < > = values in this interval not displayed. Liver Function Tests:  Recent Labs  07/28/14 1035 08/11/14 0926 09/22/14 0905  AST 9 12 10   ALT 8 9 11   ALKPHOS 87 100 109  BILITOT 0.42 0.37 0.39  PROT 6.4 6.8 6.2*  ALBUMIN 2.9* 3.3* 3.1*   No results for input(s): LIPASE, AMYLASE in the last 8760 hours. No results for input(s): AMMONIA in the last 8760 hours. CBC:  Recent Labs  11/05/14 1112  12/03/14 1005 12/14/14 12/28/14 12/31/14 0856  WBC 4.9  < > 4.7 4.0 4.5 5.6  NEUTROABS 3.2  --  3.1  --   --  4.1  HGB 8.6*  < > 7.8* 8.8* 7.7* 7.9*  HCT 25.5*  < > 23.0* 26* 23* 23.6*  MCV 109.0*  --  106.5*  --   --  104.9*  PLT 94*  < > 82* 70* 60* 72*  < > = values in this interval not displayed. Lipid Panel: No results for input(s): CHOL, HDL, LDLCALC, TRIG, CHOLHDL, LDLDIRECT in the last 8760 hours.  Past Procedures:  CXR 06/29/13 showed there is mild patchy atelectasis or interstitial pneumonitis at the right lung base appearing significantly improved.  Assessment/Plan Bronchitis Completed Azithromycin. Persisted cough, afebrile, no O2 desaturation or chest pain. Will add Medrol dose pk and Neb x3 days for after infection symptomatic management.   Duodenal ulcer Stable, takes Omeprazole 20mg  daily, no c/o stomach pain, indigestion, or melena.      CHF (congestive heart failure) takes Furosemide 40mg  and Spironolactone 25mg  daily, worsened BLE edema-but no increased SOB, cough, sputum production, or O2 desaturation. CXR 06/29/13 showed no pulmonary vascular congestion. Continue to observe.      CKD (chronic kidney disease) Creatinine 1.5-2.0    Type 2 diabetes mellitus with diabetic chronic kidney disease Creatinine near 2Lantus 54 qd Novolog 12u for CBG >450 instead of 5u with  meals.     Depression stable, continue Cymbalta 60mg  daily.     Dementia No change, off Namenda R>B in setting of bone marrow dysplasia . Continue SNF for care needs   Essential hypertension, benign Controlled on Metoprolol 25mg  bid. off Losartan due to CKD. Creatinine 1.5-2.0.   Hypothyroidism Levothyroxine 60mcg, TSH 2.24 07/28/14  Anemia in chronic kidney disease 09/22/14 [facility] Center: Hx of MDS and no longer responding to Aranesp-transfuse only in future. CBC Thr-transfusion Hgb<8 11/16/14 Hg 8.7-MDS   Thrombocytopenia 12/14/14 Plt 70   Hyperkalemia 07/22/15 K 4.2  MDS (myelodysplastic syndrome) 12/28/14 Hgb 7.7, plt 60. F/u Oncology.     Family/ Staff Communication: observe the patient  Goals of Care: SNF  Labs/tests ordered: none

## 2015-01-05 NOTE — Assessment & Plan Note (Signed)
Creatinine near 2Lantus 54 qd Novolog 12u for CBG >450 instead of 5u with meals.

## 2015-01-05 NOTE — Assessment & Plan Note (Signed)
09/22/14 Cancer Center: Hx of MDS and no longer responding to Aranesp-transfuse only in future. CBC Thr-transfusion Hgb<8 11/16/14 Hg 8.7-MDS

## 2015-01-05 NOTE — Assessment & Plan Note (Signed)
Stable, takes Omeprazole 20mg  daily, no c/o stomach pain, indigestion, or melena.

## 2015-01-11 ENCOUNTER — Telehealth: Payer: Self-pay | Admitting: *Deleted

## 2015-01-11 LAB — CBC AND DIFFERENTIAL
HCT: 29 % — AB (ref 41–53)
Hemoglobin: 10.1 g/dL — AB (ref 13.5–17.5)
PLATELETS: 103 10*3/uL — AB (ref 150–399)
WBC: 4.8 10^3/mL

## 2015-01-11 NOTE — Telephone Encounter (Signed)
CBC received from [facility].  Hgb 10.1. No need for transfusion this week.   This RN faxed new order 2 weeks ago to only check CBC once a month and next one was to be done on 6/28. Not sure why it was drawn today.   Left VM for wife informing her of Hgb good, no need for transfusion.   [facility] to check CBC again on 6/28 and then will send them new order for once a month again after that.   Asked wife to call back if any questions.

## 2015-01-12 ENCOUNTER — Other Ambulatory Visit: Payer: Self-pay | Admitting: Nurse Practitioner

## 2015-01-12 DIAGNOSIS — D631 Anemia in chronic kidney disease: Secondary | ICD-10-CM

## 2015-01-12 DIAGNOSIS — D696 Thrombocytopenia, unspecified: Secondary | ICD-10-CM

## 2015-01-12 DIAGNOSIS — D469 Myelodysplastic syndrome, unspecified: Secondary | ICD-10-CM

## 2015-01-12 DIAGNOSIS — N189 Chronic kidney disease, unspecified: Principal | ICD-10-CM

## 2015-01-25 LAB — CBC AND DIFFERENTIAL
HEMATOCRIT: 22 % — AB (ref 41–53)
Hemoglobin: 7.9 g/dL — AB (ref 13.5–17.5)
Platelets: 23 10*3/uL — AB (ref 150–399)
WBC: 4.3 10^3/mL

## 2015-01-26 ENCOUNTER — Telehealth: Payer: Self-pay | Admitting: *Deleted

## 2015-01-26 ENCOUNTER — Other Ambulatory Visit: Payer: Self-pay | Admitting: Nurse Practitioner

## 2015-01-26 ENCOUNTER — Other Ambulatory Visit: Payer: Self-pay | Admitting: Hematology and Oncology

## 2015-01-26 DIAGNOSIS — N189 Chronic kidney disease, unspecified: Principal | ICD-10-CM

## 2015-01-26 DIAGNOSIS — D696 Thrombocytopenia, unspecified: Secondary | ICD-10-CM

## 2015-01-26 DIAGNOSIS — D469 Myelodysplastic syndrome, unspecified: Secondary | ICD-10-CM

## 2015-01-26 DIAGNOSIS — D631 Anemia in chronic kidney disease: Secondary | ICD-10-CM

## 2015-01-26 NOTE — Telephone Encounter (Signed)
Received CBC from [facility].  Hgb 7.9.  Dr. Alvy Bimler ordered 2 units of blood transfusion on Friday 7/1.  POF sent and notified pt's wife.  She verbalized understanding.

## 2015-01-26 NOTE — Telephone Encounter (Signed)
Per staff message and POF I have scheduled appts. Advised scheduler of appts. [name]  

## 2015-01-27 ENCOUNTER — Telehealth: Payer: Self-pay | Admitting: *Deleted

## 2015-01-27 NOTE — Telephone Encounter (Signed)
Informed pt's wife of appt tomorrow at 7:30 am.  She will arrange transportation.

## 2015-01-28 ENCOUNTER — Other Ambulatory Visit: Payer: Medicare Other

## 2015-01-28 ENCOUNTER — Ambulatory Visit (HOSPITAL_BASED_OUTPATIENT_CLINIC_OR_DEPARTMENT_OTHER): Payer: Medicare Other

## 2015-01-28 ENCOUNTER — Encounter: Payer: Self-pay | Admitting: General Practice

## 2015-01-28 ENCOUNTER — Ambulatory Visit (HOSPITAL_COMMUNITY)
Admission: RE | Admit: 2015-01-28 | Discharge: 2015-01-28 | Disposition: A | Discharge status: Home / Self Care | Payer: Medicare Other | Source: Ambulatory Visit | Attending: Hematology | Admitting: Hematology

## 2015-01-28 VITALS — BP 104/51 | HR 87 | Temp 98.5°F | Resp 20

## 2015-01-28 DIAGNOSIS — D469 Myelodysplastic syndrome, unspecified: Secondary | ICD-10-CM

## 2015-01-28 LAB — PREPARE RBC (CROSSMATCH)

## 2015-01-28 MED ORDER — SODIUM CHLORIDE 0.9 % IV SOLN
250.0000 mL | Freq: Once | INTRAVENOUS | Status: AC
  Administered 2015-01-28: 250 mL via INTRAVENOUS

## 2015-01-28 MED ORDER — DIPHENHYDRAMINE HCL 25 MG PO CAPS
ORAL_CAPSULE | ORAL | Status: AC
  Filled 2015-01-28: qty 1

## 2015-01-28 MED ORDER — DIPHENHYDRAMINE HCL 25 MG PO CAPS
25.0000 mg | ORAL_CAPSULE | Freq: Once | ORAL | Status: AC
  Administered 2015-01-28: 25 mg via ORAL

## 2015-01-28 MED ORDER — FUROSEMIDE 10 MG/ML IJ SOLN: 20.0000 mg | Freq: Once | INTRAMUSCULAR | Status: DC

## 2015-01-28 MED ORDER — FUROSEMIDE 10 MG/ML IJ SOLN
INTRAMUSCULAR | Status: AC
  Filled 2015-01-28: qty 2

## 2015-01-28 MED ORDER — ACETAMINOPHEN 325 MG PO TABS
ORAL_TABLET | ORAL | Status: AC
  Filled 2015-01-28: qty 2

## 2015-01-28 MED ORDER — ACETAMINOPHEN 325 MG PO TABS
650.0000 mg | ORAL_TABLET | Freq: Once | ORAL | Status: AC
  Administered 2015-01-28: 650 mg via ORAL

## 2015-01-28 NOTE — Patient Instructions (Signed)

## 2015-01-28 NOTE — Progress Notes (Signed)
Spiritual Care Note  [name] [name] was cheerful when we exchanged greetings in the infusion room this morning.  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  She looks forward to another consultation with Dr Alvy Bimler in late August.  Provided pastoral presence, reflective listening, normalization of feelings, and encouragement.  Continuing to follow for support, but please also page as needs arise.  Thank you.  [place], [place] Pager [phone] VM [phone]

## 2015-01-28 NOTE — Progress Notes (Signed)
B/P at end of 1st unit is 105/45.  Patient denies any SOB or discomfort.  Discussed Lasix with [facility] RN with Dr. Alvy Bimler.  Will hold lasix at present due to low B/P and re-evaluate at end of 2nd unit. B/P remains low at 104/51. Patient again denies any SOB or discomfort.  Will again hold IV Lasix per VO given and read back- Dr. Alvy Bimler.

## 2015-01-29 LAB — TYPE AND SCREEN
ABO/RH(D): O POS
ANTIBODY SCREEN: NEGATIVE
Unit division: 0
Unit division: 0

## 2015-02-18 ENCOUNTER — Encounter: Payer: Self-pay | Admitting: Gastroenterology

## 2015-02-22 ENCOUNTER — Other Ambulatory Visit: Payer: Self-pay | Admitting: Hematology and Oncology

## 2015-02-22 DIAGNOSIS — D469 Myelodysplastic syndrome, unspecified: Secondary | ICD-10-CM

## 2015-02-23 ENCOUNTER — Other Ambulatory Visit: Payer: Self-pay | Admitting: *Deleted

## 2015-02-23 ENCOUNTER — Encounter: Payer: Self-pay | Admitting: *Deleted

## 2015-02-23 ENCOUNTER — Telehealth: Payer: Self-pay | Admitting: Hematology and Oncology

## 2015-02-23 NOTE — Telephone Encounter (Signed)
Confirmed appointment for 07/29

## 2015-02-25 ENCOUNTER — Other Ambulatory Visit: Payer: Self-pay | Admitting: *Deleted

## 2015-02-25 ENCOUNTER — Ambulatory Visit: Payer: Medicare Other

## 2015-02-25 ENCOUNTER — Ambulatory Visit (HOSPITAL_BASED_OUTPATIENT_CLINIC_OR_DEPARTMENT_OTHER): Payer: Medicare Other

## 2015-02-25 VITALS — BP 115/54 | HR 64 | Temp 98.3°F | Resp 18

## 2015-02-25 DIAGNOSIS — D469 Myelodysplastic syndrome, unspecified: Secondary | ICD-10-CM

## 2015-02-25 LAB — CBC WITH DIFFERENTIAL/PLATELET
BASO%: 0.6 % (ref 0.0–2.0)
BASOS ABS: 0 10*3/uL (ref 0.0–0.1)
EOS%: 1.7 % (ref 0.0–7.0)
Eosinophils Absolute: 0.1 10*3/uL (ref 0.0–0.5)
HCT: 24.3 % — ABNORMAL LOW (ref 38.4–49.9)
HGB: 8.2 g/dL — ABNORMAL LOW (ref 13.0–17.1)
LYMPH#: 1.3 10*3/uL (ref 0.9–3.3)
LYMPH%: 30.3 % (ref 14.0–49.0)
MCH: 34.6 pg — AB (ref 27.2–33.4)
MCHC: 33.7 g/dL (ref 32.0–36.0)
MCV: 102.4 fL — ABNORMAL HIGH (ref 79.3–98.0)
MONO#: 0.1 10*3/uL (ref 0.1–0.9)
MONO%: 2.9 % (ref 0.0–14.0)
NEUT#: 2.5 10*3/uL (ref 1.5–6.5)
NEUT%: 58.4 % (ref 39.0–75.0)
Platelets: 66 10*3/uL — ABNORMAL LOW (ref 140–400)
RBC: 2.38 10*6/uL — ABNORMAL LOW (ref 4.20–5.82)
RDW: 24.2 % — ABNORMAL HIGH (ref 11.0–14.6)
WBC: 4.2 10*3/uL (ref 4.0–10.3)

## 2015-02-25 LAB — HOLD TUBE, BLOOD BANK

## 2015-02-25 LAB — PREPARE RBC (CROSSMATCH)

## 2015-02-25 MED ORDER — SODIUM CHLORIDE 0.9 % IV SOLN
250.0000 mL | Freq: Once | INTRAVENOUS | Status: AC
  Administered 2015-02-25: 250 mL via INTRAVENOUS

## 2015-02-25 MED ORDER — HEPARIN SOD (PORK) LOCK FLUSH 100 UNIT/ML IV SOLN
500.0000 [IU] | Freq: Every day | INTRAVENOUS | Status: DC | PRN
  Filled 2015-02-25: qty 5

## 2015-02-25 MED ORDER — SODIUM CHLORIDE 0.9 % IJ SOLN
10.0000 mL | INTRAMUSCULAR | Status: DC | PRN
  Filled 2015-02-25: qty 10

## 2015-02-25 MED ORDER — DIPHENHYDRAMINE HCL 25 MG PO CAPS
ORAL_CAPSULE | ORAL | Status: AC
  Filled 2015-02-25: qty 1

## 2015-02-25 MED ORDER — ACETAMINOPHEN 325 MG PO TABS
650.0000 mg | ORAL_TABLET | Freq: Once | ORAL | Status: AC
  Administered 2015-02-25: 650 mg via ORAL

## 2015-02-25 MED ORDER — DIPHENHYDRAMINE HCL 25 MG PO CAPS
25.0000 mg | ORAL_CAPSULE | Freq: Once | ORAL | Status: AC
  Administered 2015-02-25: 25 mg via ORAL

## 2015-02-25 MED ORDER — ACETAMINOPHEN 325 MG PO TABS
ORAL_TABLET | ORAL | Status: AC
  Filled 2015-02-25: qty 2

## 2015-02-25 NOTE — Patient Instructions (Signed)

## 2015-02-28 ENCOUNTER — Ambulatory Visit (HOSPITAL_COMMUNITY)
Admission: RE | Admit: 2015-02-28 | Discharge: 2015-02-28 | Disposition: A | Discharge status: Home / Self Care | Payer: Medicare Other | Source: Ambulatory Visit | Attending: Hematology | Admitting: Hematology

## 2015-02-28 DIAGNOSIS — N189 Chronic kidney disease, unspecified: Secondary | ICD-10-CM

## 2015-02-28 DIAGNOSIS — D469 Myelodysplastic syndrome, unspecified: Secondary | ICD-10-CM | POA: Insufficient documentation

## 2015-02-28 DIAGNOSIS — D631 Anemia in chronic kidney disease: Secondary | ICD-10-CM

## 2015-02-28 LAB — TYPE AND SCREEN
ABO/RH(D): O POS
ANTIBODY SCREEN: NEGATIVE
Unit division: 0
Unit division: 0

## 2015-03-22 LAB — CBC AND DIFFERENTIAL
HEMATOCRIT: 22 % — AB (ref 41–53)
HEMOGLOBIN: 7.5 g/dL — AB (ref 13.5–17.5)
Platelets: 51 10*3/uL — AB (ref 150–399)
WBC: 4 10*3/mL

## 2015-03-23 ENCOUNTER — Other Ambulatory Visit: Payer: Self-pay | Admitting: Nurse Practitioner

## 2015-03-23 DIAGNOSIS — D469 Myelodysplastic syndrome, unspecified: Secondary | ICD-10-CM

## 2015-03-24 ENCOUNTER — Ambulatory Visit (HOSPITAL_BASED_OUTPATIENT_CLINIC_OR_DEPARTMENT_OTHER): Payer: Medicare Other

## 2015-03-24 ENCOUNTER — Telehealth: Payer: Self-pay | Admitting: *Deleted

## 2015-03-24 ENCOUNTER — Ambulatory Visit (HOSPITAL_BASED_OUTPATIENT_CLINIC_OR_DEPARTMENT_OTHER): Payer: Medicare Other | Admitting: Hematology and Oncology

## 2015-03-24 ENCOUNTER — Other Ambulatory Visit (HOSPITAL_BASED_OUTPATIENT_CLINIC_OR_DEPARTMENT_OTHER): Payer: Medicare Other

## 2015-03-24 ENCOUNTER — Encounter: Payer: Self-pay | Admitting: Hematology and Oncology

## 2015-03-24 ENCOUNTER — Telehealth: Payer: Self-pay | Admitting: Hematology and Oncology

## 2015-03-24 VITALS — BP 103/39 | HR 82 | Temp 98.1°F | Resp 18 | Ht 68.0 in | Wt 229.6 lb

## 2015-03-24 VITALS — BP 117/43 | HR 82 | Temp 97.8°F | Resp 18

## 2015-03-24 DIAGNOSIS — N189 Chronic kidney disease, unspecified: Secondary | ICD-10-CM

## 2015-03-24 DIAGNOSIS — F0390 Unspecified dementia without behavioral disturbance: Secondary | ICD-10-CM

## 2015-03-24 DIAGNOSIS — D631 Anemia in chronic kidney disease: Secondary | ICD-10-CM

## 2015-03-24 DIAGNOSIS — D469 Myelodysplastic syndrome, unspecified: Secondary | ICD-10-CM

## 2015-03-24 DIAGNOSIS — D696 Thrombocytopenia, unspecified: Secondary | ICD-10-CM

## 2015-03-24 LAB — HOLD TUBE, BLOOD BANK

## 2015-03-24 LAB — CBC WITH DIFFERENTIAL/PLATELET
BASO%: 0.2 % (ref 0.0–2.0)
Basophils Absolute: 0 10*3/uL (ref 0.0–0.1)
EOS%: 3.2 % (ref 0.0–7.0)
Eosinophils Absolute: 0.1 10*3/uL (ref 0.0–0.5)
HCT: 23.6 % — ABNORMAL LOW (ref 38.4–49.9)
HGB: 8 g/dL — ABNORMAL LOW (ref 13.0–17.1)
LYMPH%: 26 % (ref 14.0–49.0)
MCH: 33.6 pg — ABNORMAL HIGH (ref 27.2–33.4)
MCHC: 33.9 g/dL (ref 32.0–36.0)
MCV: 99.2 fL — ABNORMAL HIGH (ref 79.3–98.0)
MONO#: 0.4 10*3/uL (ref 0.1–0.9)
MONO%: 9 % (ref 0.0–14.0)
NEUT%: 61.6 % (ref 39.0–75.0)
NEUTROS ABS: 2.5 10*3/uL (ref 1.5–6.5)
Platelets: 61 10*3/uL — ABNORMAL LOW (ref 140–400)
RBC: 2.38 10*6/uL — AB (ref 4.20–5.82)
RDW: 21.5 % — AB (ref 11.0–14.6)
WBC: 4.1 10*3/uL (ref 4.0–10.3)
lymph#: 1.1 10*3/uL (ref 0.9–3.3)
nRBC: 0 % (ref 0–0)

## 2015-03-24 LAB — PREPARE RBC (CROSSMATCH)

## 2015-03-24 MED ORDER — ACETAMINOPHEN 325 MG PO TABS
650.0000 mg | ORAL_TABLET | Freq: Once | ORAL | Status: AC
  Administered 2015-03-24: 650 mg via ORAL

## 2015-03-24 MED ORDER — DIPHENHYDRAMINE HCL 25 MG PO CAPS
25.0000 mg | ORAL_CAPSULE | Freq: Once | ORAL | Status: AC
  Administered 2015-03-24: 25 mg via ORAL

## 2015-03-24 MED ORDER — DIPHENHYDRAMINE HCL 25 MG PO CAPS
ORAL_CAPSULE | ORAL | Status: AC
  Filled 2015-03-24: qty 1

## 2015-03-24 MED ORDER — SODIUM CHLORIDE 0.9 % IV SOLN
250.0000 mL | Freq: Once | INTRAVENOUS | Status: AC
  Administered 2015-03-24: 250 mL via INTRAVENOUS

## 2015-03-24 MED ORDER — ACETAMINOPHEN 325 MG PO TABS
ORAL_TABLET | ORAL | Status: AC
  Filled 2015-03-24: qty 2

## 2015-03-24 NOTE — Assessment & Plan Note (Signed)
The patient is not on any form of systemic treatment due to his age and comorbidities. Despite increasing the frequency of Aranesp injection he has lack of response to anemia and that was subsequently discontinued He is in agreement to come in for palliative blood transfusion only.  Over the last 6 months, we have established a pattern where by the patient with required 2 units of blood every 4 weeks to keep hemoglobin above 8 g  I have arranged for him to return here every 4 weeks for blood transfusion and will discontinue blood work monitoring at the nursing home. Both the patient and his wife is in agreement with the plan of care

## 2015-03-24 NOTE — Assessment & Plan Note (Addendum)
The cause is related to his myelodysplastic syndrome. It is mild and there is little change compared from previous platelet count. The patient denies recent history of bleeding such as epistaxis, hematuria or hematochezia. He is asymptomatic from the thrombocytopenia. I will observe for now.  he does not require platelet transfusion now.

## 2015-03-24 NOTE — Assessment & Plan Note (Signed)
This is currently stable and he is receiving medications for this.

## 2015-03-24 NOTE — Patient Instructions (Signed)

## 2015-03-24 NOTE — Telephone Encounter (Signed)
per pof to sch pt appt-sent MW email to sch pt appt-pt to get updated sch b4 leaving trmt room °

## 2015-03-24 NOTE — Progress Notes (Signed)
[place] OFFICE PROGRESS NOTE  Patient Care Team: Estill Dooms, MD as PCP - General (Internal Medicine) Man Mast X, NP as Nurse Practitioner (Nurse Practitioner)  SUMMARY OF ONCOLOGIC HISTORY:   MDS (myelodysplastic syndrome)   06/07/2014 Initial Diagnosis MDS (myelodysplastic syndrome)  [name] [name] 81 y.o. male was transferred to my care after his prior physician has left.  I reviewed the patient's records extensive and collaborated the history with the patient. Summary of his history is as follows: He was originally referred here for evaluation of chronic anemia and thrombocytopenia and evaluated by Dr Juliann Mule on 08/26/2013. He has dementia and provides limited history. Of note, he has Chronic kidney disease with a creatinine clearance of 30 ml/min and he resides at SNF at [facility]. As previously reported, his recent anemia work-up revealed a Hgb of 8.3 on 09/02/2012; Normal iron 87, B12 740, Folate > 20. He has not been on iron supplementation. His hgb on 08/13/13 revealed a hgb of 8.0. GI (Dr. Norberto Sorenson T. Dagoberto Ligas. MD) was consulted on 08/26/2013 for anemia. Last upper endoscopy on 12/13/2011 revealed mild gastritis with recommendations for a PPI q am long term. The patient's review of systems for symptoms of anemia was negative.Examining the plt trend revealed his thrombocytopenia started around 09/12/2011 following laparotomy by Dr. Lilyan Punt and repair of perforated ulcer. He has since been on nexium 40 mg bid. Of note he takes namenda for his dementia; lasix 20 mg daily for congestive heart failure; lantus for his diabetes. He is on folic acid daily. He had bone marrow aspirate and biopsy which confirmed myelodysplastic syndrome.   The patient is receiving erythropoietin stimulating agents for severe anemia. The frequency and dose of the injections were increased due to lack of response. He has also been receiving intermittent blood transfusion Ultimately, Aranesp  was discontinued in February 2016 due to lack of response  INTERVAL HISTORY: Please see below for problem oriented charting. He feels well. He has been coming here every month for transfusion support. His wife felt that he has felt the same. There were no chest pain, shortness of breath or signs and symptoms of ischemia. He has not been hospitalized over the last 6 months for any new medical problems. The patient denies any recent signs or symptoms of bleeding such as spontaneous epistaxis, hematuria or hematochezia.  REVIEW OF SYSTEMS:   Constitutional: Denies fevers, chills or abnormal weight loss Eyes: Denies blurriness of vision Ears, nose, mouth, throat, and face: Denies mucositis or sore throat Respiratory: Denies cough, dyspnea or wheezes Cardiovascular: Denies palpitation, chest discomfort or lower extremity swelling Gastrointestinal:  Denies nausea, heartburn or change in bowel habits Skin: Denies abnormal skin rashes Lymphatics: Denies new lymphadenopathy or easy bruising Neurological:Denies numbness, tingling or new weaknesses Behavioral/Psych: Mood is stable, no new changes  All other systems were reviewed with the patient and are negative.  I have reviewed the past medical history, past surgical history, social history and family history with the patient and they are unchanged from previous note.  ALLERGIES:  is allergic to enalapril; metformin and related; and milk-related compounds.  MEDICATIONS:  Current Outpatient Prescriptions  Medication Sig Dispense Refill  . acetaminophen (TYLENOL) 325 MG tablet Take 650 mg by mouth every 4 (four) hours as needed (for pain).     . DULoxetine (CYMBALTA) 60 MG capsule Take 60 mg by mouth daily.    . famotidine (PEPCID) 20 MG tablet     . fluticasone-salmeterol (ADVAIR HFA)  45-21 MCG/ACT inhaler Inhale 2 puffs into the lungs every 12 (twelve) hours.     . folic acid (FOLVITE) 1 MG tablet Take 1 mg by mouth daily.     . furosemide  (LASIX) 40 MG tablet Take 40 mg by mouth daily.    . insulin aspart (NOVOLOG) 100 UNIT/ML injection Inject 5 Units into the skin 3 (three) times daily before meals. For CBG>150    . insulin glargine (LANTUS) 100 UNIT/ML injection Inject 54 Units into the skin at bedtime.    Marland Kitchen levothyroxine (SYNTHROID, LEVOTHROID) 75 MCG tablet Take 75 mcg by mouth daily.     . metoprolol tartrate (LOPRESSOR) 25 MG tablet Take 25 mg by mouth 2 (two) times daily.    Marland Kitchen omeprazole (PRILOSEC) 20 MG capsule Take 1 capsule (20 mg total) by mouth daily. 30 capsule 3  . spironolactone (ALDACTONE) 25 MG tablet Take 25 mg by mouth daily.    Marland Kitchen tiotropium (SPIRIVA) 18 MCG inhalation capsule Place 18 mcg into inhaler and inhale daily. 2 puffs daily    . vitamin B-12 (CYANOCOBALAMIN) 1000 MCG tablet Take 1,000 mcg by mouth daily.     No current facility-administered medications for this visit.   Facility-Administered Medications Ordered in Other Visits  Medication Dose Route Frequency Provider Last Rate Last Dose  . 0.9 %  sodium chloride infusion  250 mL Intravenous Once Heath Lark, MD      . acetaminophen (TYLENOL) tablet 650 mg  650 mg Oral Once Heath Lark, MD      . diphenhydrAMINE (BENADRYL) capsule 25 mg  25 mg Oral Once Heath Lark, MD        PHYSICAL EXAMINATION: ECOG PERFORMANCE STATUS: 2 - Symptomatic, <50% confined to bed  Filed Vitals:   03/24/15 0831  BP: 103/39  Pulse: 82  Temp: 98.1 F (36.7 C)  Resp: 18   Filed Weights   03/24/15 0831  Weight: 229 lb 9.6 oz (104.146 kg)    GENERAL:alert, no distress and comfortable. He is morbidly obese SKIN: skin color, texture, turgor are normal, no rashes or significant lesions. No bruises are noted EYES: normal, Conjunctiva are pale and non-injected, sclera clear Musculoskeletal:no cyanosis of digits and no clubbing  NEURO: alert & oriented x 3 with fluent speech, no focal motor/sensory deficits  LABORATORY DATA:  I have reviewed the data as listed     Component Value Date/Time   NA 135* 09/22/2014 0905   NA 137 07/21/2014   NA 136 09/21/2011 0400   K 5.6* 09/22/2014 0905   K 4.2 07/21/2014   CL 99 09/21/2011 0400   CO2 25 09/22/2014 0905   CO2 30 09/21/2011 0400   GLUCOSE 210* 09/22/2014 0905   GLUCOSE 229* 09/21/2011 0400   BUN 34.4* 09/22/2014 0905   BUN 24* 07/21/2014   BUN 26* 09/21/2011 0400   CREATININE 1.7* 09/22/2014 0905   CREATININE 1.0 07/21/2014   CREATININE 1.37* 09/21/2011 0400   CALCIUM 9.0 09/22/2014 0905   CALCIUM 7.8* 09/21/2011 0400   PROT 6.2* 09/22/2014 0905   PROT 5.4* 09/13/2011 0345   ALBUMIN 3.1* 09/22/2014 0905   ALBUMIN 2.2* 09/13/2011 0345   AST 10 09/22/2014 0905   AST 23 07/21/2014   ALT 11 09/22/2014 0905   ALT 26 07/21/2014   ALKPHOS 109 09/22/2014 0905   ALKPHOS 203* 07/21/2014   BILITOT 0.39 09/22/2014 0905   BILITOT 0.3 09/13/2011 0345   GFRNONAA 44* 09/21/2011 0400   GFRAA 51* 09/21/2011 0400  No results found for: SPEP, UPEP  Lab Results  Component Value Date   WBC 4.1 03/24/2015   NEUTROABS 2.5 03/24/2015   HGB 8.0* 03/24/2015   HCT 23.6* 03/24/2015   MCV 99.2* 03/24/2015   PLT 61* 03/24/2015      Chemistry      Component Value Date/Time   NA 135* 09/22/2014 0905   NA 137 07/21/2014   NA 136 09/21/2011 0400   K 5.6* 09/22/2014 0905   K 4.2 07/21/2014   CL 99 09/21/2011 0400   CO2 25 09/22/2014 0905   CO2 30 09/21/2011 0400   BUN 34.4* 09/22/2014 0905   BUN 24* 07/21/2014   BUN 26* 09/21/2011 0400   CREATININE 1.7* 09/22/2014 0905   CREATININE 1.0 07/21/2014   CREATININE 1.37* 09/21/2011 0400   GLU 179 07/21/2014      Component Value Date/Time   CALCIUM 9.0 09/22/2014 0905   CALCIUM 7.8* 09/21/2011 0400   ALKPHOS 109 09/22/2014 0905   ALKPHOS 203* 07/21/2014   AST 10 09/22/2014 0905   AST 23 07/21/2014   ALT 11 09/22/2014 0905   ALT 26 07/21/2014   BILITOT 0.39 09/22/2014 0905   BILITOT 0.3 09/13/2011 0345       RADIOGRAPHIC STUDIES: I have  personally reviewed the radiological images as listed and agreed with the findings in the report. No results found.   ASSESSMENT & PLAN:  MDS (myelodysplastic syndrome) The patient is not on any form of systemic treatment due to his age and comorbidities. Despite increasing the frequency of Aranesp injection he has lack of response to anemia and that was subsequently discontinued He is in agreement to come in for palliative blood transfusion only.  Over the last 6 months, we have established a pattern where by the patient with required 2 units of blood every 4 weeks to keep hemoglobin above 8 g  I have arranged for him to return here every 4 weeks for blood transfusion and will discontinue blood work monitoring at the nursing home. Both the patient and his wife is in agreement with the plan of care   Thrombocytopenia The cause is related to his myelodysplastic syndrome. It is mild and there is little change compared from previous platelet count. The patient denies recent history of bleeding such as epistaxis, hematuria or hematochezia. He is asymptomatic from the thrombocytopenia. I will observe for now.  he does not require platelet transfusion now.    Dementia This is currently stable and he is receiving medications for this.     Orders Placed This Encounter  Procedures  . Hold Tube, Blood Bank    Standing Status: Standing     Number of Occurrences: 9     Standing Expiration Date: 03/23/2016   All questions were answered. The patient knows to call the clinic with any problems, questions or concerns. No barriers to learning was detected. I spent 15 minutes counseling the patient face to face. The total time spent in the appointment was 20 minutes and more than 50% was on counseling and review of test results     [facility], Elizabeth, MD 03/24/2015 9:06 AM

## 2015-03-24 NOTE — Telephone Encounter (Signed)
Per staff message and POF I have scheduled appts. Advised scheduler of appts. [name]  

## 2015-03-25 LAB — TYPE AND SCREEN
ABO/RH(D): O POS
ANTIBODY SCREEN: NEGATIVE
UNIT DIVISION: 0
Unit division: 0

## 2015-03-25 NOTE — Addendum Note (Signed)
Addended by: Adalberto Cole on: 03/25/2015 09:46 AM   Modules accepted: Orders

## 2015-03-28 ENCOUNTER — Telehealth: Payer: Self-pay | Admitting: *Deleted

## 2015-03-28 NOTE — Telephone Encounter (Signed)
Daughter, Erika Hussar,  left VM asking if she can speak w/ Dr. Alvy Bimler and get update of pt's condition?  She is not able to be her for his appts and says her mother not able to give a good report.   Daughter is on pt's release of information form.   Debra's number is [phone].

## 2015-03-29 ENCOUNTER — Telehealth: Payer: Self-pay | Admitting: *Deleted

## 2015-03-29 NOTE — Telephone Encounter (Signed)
Pt's Daughter left VM states she has missed several of Dr. Calton Dach calls.  She did get one VM from Dr. Alvy Bimler today but has some other specific questions to ask.  I called her back at work number she gave [phone], ext [phone] and left a VM asking her to call nurse's line and leave VM w/ specific questions.   I can then take the specific questions back to Dr. Alvy Bimler.

## 2015-03-30 ENCOUNTER — Telehealth: Payer: Self-pay | Admitting: *Deleted

## 2015-03-30 NOTE — Telephone Encounter (Signed)
Pt's daughter, Hilda Blades, had left VM asking about pt's prognosis and any information about the progression of his condition.  I called back and left VM informing in general,  He is doing better than Dr. Alvy Bimler had anticipated.  His CBCs are relatively stable over past few months.  He has not had a BMBX since last year and not under any active treatment.  We will continue to transfuse pt as long as it adds to his quality of life.   But he does have a progressive disease that is not being treated due to his age and condition.  It will continue to get worse but Dr. Alvy Bimler cannot say how quickly.   Please call us back if any other questions.

## 2015-03-31 ENCOUNTER — Ambulatory Visit (HOSPITAL_COMMUNITY)
Admission: RE | Admit: 2015-03-31 | Discharge: 2015-03-31 | Disposition: A | Discharge status: Home / Self Care | Payer: Medicare Other | Source: Ambulatory Visit | Attending: Hematology | Admitting: Hematology

## 2015-03-31 DIAGNOSIS — D469 Myelodysplastic syndrome, unspecified: Secondary | ICD-10-CM | POA: Insufficient documentation

## 2015-04-18 ENCOUNTER — Encounter: Payer: Self-pay | Admitting: Nurse Practitioner

## 2015-04-18 ENCOUNTER — Non-Acute Institutional Stay (SKILLED_NURSING_FACILITY): Payer: Medicare Other | Admitting: Nurse Practitioner

## 2015-04-18 DIAGNOSIS — I10 Essential (primary) hypertension: Secondary | ICD-10-CM

## 2015-04-18 DIAGNOSIS — K269 Duodenal ulcer, unspecified as acute or chronic, without hemorrhage or perforation: Secondary | ICD-10-CM

## 2015-04-18 DIAGNOSIS — D469 Myelodysplastic syndrome, unspecified: Secondary | ICD-10-CM | POA: Diagnosis not present

## 2015-04-18 DIAGNOSIS — F32A Depression, unspecified: Secondary | ICD-10-CM

## 2015-04-18 DIAGNOSIS — I5040 Unspecified combined systolic (congestive) and diastolic (congestive) heart failure: Secondary | ICD-10-CM

## 2015-04-18 DIAGNOSIS — N189 Chronic kidney disease, unspecified: Secondary | ICD-10-CM | POA: Diagnosis not present

## 2015-04-18 DIAGNOSIS — F329 Major depressive disorder, single episode, unspecified: Secondary | ICD-10-CM

## 2015-04-18 DIAGNOSIS — E1122 Type 2 diabetes mellitus with diabetic chronic kidney disease: Secondary | ICD-10-CM

## 2015-04-18 DIAGNOSIS — E039 Hypothyroidism, unspecified: Secondary | ICD-10-CM | POA: Diagnosis not present

## 2015-04-18 DIAGNOSIS — F0390 Unspecified dementia without behavioral disturbance: Secondary | ICD-10-CM

## 2015-04-18 NOTE — Assessment & Plan Note (Signed)
Continue Levothyroxine 55mcg, TSH 2.24 07/28/14

## 2015-04-18 NOTE — Assessment & Plan Note (Signed)
Stable, no change in tx,  no c/o stomach pain, indigestion, or melena.

## 2015-04-18 NOTE — Assessment & Plan Note (Signed)
Gradual decline, off Namenda R>B in setting of bone marrow dysplasia . Continue SNF for care needs

## 2015-04-18 NOTE — Assessment & Plan Note (Signed)
Controlled, continue Metoprolol 25mg  bid. off Losartan due to CKD. Creatinine 1.5-2.0.

## 2015-04-18 NOTE — Assessment & Plan Note (Signed)
Compensated, continue Furosemide 40mg  and Spironolactone 25mg  daily

## 2015-04-18 NOTE — Progress Notes (Signed)
Patient ID: [name] [name], male   DOB: 08-16-1919, 81 y.o.   MRN: [id number]  Location:  SNF [facility] [place]:  Marlana Latus NP  Code Status:  DNR Goals of care: Advanced Directive information    Chief Complaint  Patient presents with  . Medical Management of Chronic Issues     HPI: Patient is a 81 y.o. male seen in the SNF at [facility] today for evaluation of chronic medical conditions. Hx of PUD, asymptomatic presently, compensated CHF while on Furosemide and Spironolactone, T2DM is managed with insulin, his mood is stable on Cymbalta, his cognition has a gradual decline, off Namenda, continue SNF for care needs due to his MDS and plan of care is comfort measures. Last TH wnl 06/2015 while on Levothyroxine 78mcg daily. F/u oncology for prn blood transfusion for MDS.   Review of Systems:  Review of Systems  Constitutional: Negative for fever and chills.  HENT: Positive for hearing loss. Negative for congestion, ear discharge and ear pain.   Eyes: Negative for pain, discharge and redness.  Respiratory: Negative for cough, shortness of breath and wheezing.   Cardiovascular: Positive for leg swelling. Negative for chest pain and palpitations.       Edema 1+ BLE  Gastrointestinal: Negative for nausea, vomiting, abdominal pain and constipation.  Genitourinary: Positive for frequency. Negative for dysuria and urgency.  Musculoskeletal: Negative for myalgias, back pain and neck pain.  Skin: Negative for rash.  Neurological: Negative for dizziness, tremors, seizures, weakness and headaches.  Endo/Heme/Allergies: Does not bruise/bleed easily.  Psychiatric/Behavioral: Positive for memory loss. Negative for suicidal ideas and hallucinations. The patient is nervous/anxious.     Past Medical History  Diagnosis Date  . Hypothyroidism   . Hypertension   . CHF (congestive heart failure)   . Diabetes mellitus   . Coronary artery disease   . Restless leg syndrome   . Hyperlipemia     . Renal disease     stage II  . Sleep apnea   . Cellulitis and abscess of leg   . Obesity   . Duodenal ulcer, perforated   . Diverticulosis   . Internal hemorrhoids   . CAD (coronary artery disease)   . Anemia   . Duodenal ulcer   . Allergy   . Blood transfusion   . Cataract   . Tuberculosis     as a child  . Contact dermatitis and other eczema, due to unspecified cause 10/06/2012  . Senile dementia with depressive features 05/26/2012  . Hyposmolality and/or hyponatremia 04/02/2012  . Diarrhea 03/26/2012  . Hemorrhage of rectum and anus 03/25/2012  . Pneumonia, organism unspecified 03/12/2012  . 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 Folate-deficiency anemia 01/07/2012  . Thrombocytopenia, unspecified 09/26/2011  . Loss of weight 09/11/2011  . Dysphagia, unspecified(787.20) 06/28/2011  . Chronic kidney disease, stage III (moderate) 03/22/2011  . Cramp of limb 08/10/2010  . Chronic kidney disease, stage II (mild) 03/22/2011  . Personal history of fall 05/05/2009  . Unspecified vitamin D deficiency 02/24/2009  . Blood in stool 11/18/2008  . Dermatophytosis of the body 06/23/2007  . Obesity, unspecified 06/23/2007  . Unspecified tinnitus 06/23/2007  . Edema 06/23/2007  . Shortness of breath 06/23/2007  . Flatulence, eructation, and gas pain 06/23/2007  . Other general symptoms(780.99) 06/23/2007  . Memory loss 08/13/2003  . Screening PSA (prostate specific antigen) 07/27/2014  . Prostate cancer screening 07/27/2014  . Weight loss 07/27/2014    Patient Active Problem  List   Diagnosis Date Noted  . Bronchitis 12/31/2014  . Screening PSA (prostate specific antigen) 07/27/2014  . Prostate cancer screening 07/27/2014  . Weight loss 07/27/2014  . Complaint of debility and malaise 07/26/2014  . MDS (myelodysplastic syndrome) 06/07/2014  . Closed nondisplaced fracture of fifth left metatarsal bone 03/24/2014  . Foot pain, left 03/22/2014  .  Hyperkalemia 10/21/2013  . Thrombocytopenia 08/17/2013  . Urinary tract infection, site not specified 06/04/2013  . Unspecified gastritis and gastroduodenitis without mention of hemorrhage 04/15/2013  . Anemia in chronic kidney disease 01/05/2013  . Hypothyroidism 12/10/2012  . Fall at nursing home 10/22/2012  . Depression 10/22/2012  . Dementia 10/22/2012  . Essential hypertension, benign 10/22/2012  . Duodenal ulcer 09/13/2011  . CHF (congestive heart failure) 09/13/2011  . CKD (chronic kidney disease) 09/13/2011  . Respiratory failure 09/13/2011  . Type 2 diabetes mellitus with diabetic chronic kidney disease 09/13/2011  . Anemia 12/13/2008  . BLOOD IN STOOL 12/13/2008    Allergies  Allergen Reactions  . Enalapril Other (See Comments)    cough  . Metformin And Related Diarrhea  . Milk-Related Compounds Other (See Comments)    Doesn't remember but its a mild reaction    Medications: Patient's Medications  New Prescriptions   No medications on file  Previous Medications   ACETAMINOPHEN (TYLENOL) 325 MG TABLET    Take 650 mg by mouth every 4 (four) hours as needed (for pain).    DULOXETINE (CYMBALTA) 60 MG CAPSULE    Take 60 mg by mouth daily.   FAMOTIDINE (PEPCID) 20 MG TABLET       FLUTICASONE-SALMETEROL (ADVAIR HFA) 45-21 MCG/ACT INHALER    Inhale 2 puffs into the lungs every 12 (twelve) hours.    FOLIC ACID (FOLVITE) 1 MG TABLET    Take 1 mg by mouth daily.    FUROSEMIDE (LASIX) 40 MG TABLET    Take 40 mg by mouth daily.   INSULIN ASPART (NOVOLOG) 100 UNIT/ML INJECTION    Inject 5 Units into the skin 3 (three) times daily before meals. For CBG>150   INSULIN GLARGINE (LANTUS) 100 UNIT/ML INJECTION    Inject 54 Units into the skin at bedtime.   LEVOTHYROXINE (SYNTHROID, LEVOTHROID) 75 MCG TABLET    Take 75 mcg by mouth daily.    METOPROLOL TARTRATE (LOPRESSOR) 25 MG TABLET    Take 25 mg by mouth 2 (two) times daily.   OMEPRAZOLE (PRILOSEC) 20 MG CAPSULE    Take 1 capsule  (20 mg total) by mouth daily.   SPIRONOLACTONE (ALDACTONE) 25 MG TABLET    Take 25 mg by mouth daily.   TIOTROPIUM (SPIRIVA) 18 MCG INHALATION CAPSULE    Place 18 mcg into inhaler and inhale daily. 2 puffs daily   VITAMIN B-12 (CYANOCOBALAMIN) 1000 MCG TABLET    Take 1,000 mcg by mouth daily.  Modified Medications   No medications on file  Discontinued Medications   No medications on file    Physical Exam: Filed Vitals:   04/18/15 1309  BP: 107/69  Pulse: 81  Temp: 98.6 F (37 C)  TempSrc: Tympanic  Resp: 18   There is no weight on file to calculate BMI.  Physical Exam  Constitutional: He is oriented to person, place, and time. He appears well-developed and well-nourished. No distress.  HENT:  Head: Normocephalic and atraumatic.  Right Ear: External ear normal.  Left Ear: External ear normal.  Nose: Nose normal.  Mouth/Throat: Oropharynx is clear and moist. No oropharyngeal  exudate.  Eyes: Conjunctivae and EOM are normal. Pupils are equal, round, and reactive to light.  Neck: Normal range of motion. Neck supple. No JVD present. No thyromegaly present.  Cardiovascular: Normal rate, regular rhythm and normal heart sounds.   No murmur heard. Pulmonary/Chest: Effort normal. No respiratory distress. He has no wheezes. He has rales in the right lower field and the left lower field. He exhibits no tenderness.  Abdominal: Soft. Bowel sounds are normal. He exhibits no distension and no mass. There is no tenderness. There is no rebound.  Musculoskeletal: Normal range of motion. He exhibits edema (trace in BLE). He exhibits no tenderness.  1+ edema BLE  Lymphadenopathy:    He has no cervical adenopathy.  Neurological: He is alert and oriented to person, place, and time. He has normal reflexes. No cranial nerve deficit. Coordination normal.  Skin: Skin is warm and dry. No rash noted. He is not diaphoretic. No erythema.  Psychiatric: Thought content normal. His mood appears anxious. His  affect is angry and inappropriate. His affect is not blunt and not labile. His speech is not delayed, not tangential and not slurred. He is agitated and aggressive. He is not hyperactive, not slowed, not withdrawn, not actively hallucinating and not combative. Cognition and memory are impaired. He exhibits a depressed mood. He is communicative. He exhibits abnormal recent memory. He is attentive.    Labs reviewed: Basic Metabolic Panel:  Recent Labs  07/28/14 1035 08/11/14 0926 09/22/14 0905  NA 135* 134* 135*  K 5.3* 4.6 5.6*  CO2 25 25 25   GLUCOSE 185* 154* 210*  BUN 39.5* 41.3* 34.4*  CREATININE 1.7* 1.7* 1.7*  CALCIUM 8.9 8.6 9.0    Liver Function Tests:  Recent Labs  07/28/14 1035 08/11/14 0926 09/22/14 0905  AST 9 12 10   ALT 8 9 11   ALKPHOS 87 100 109  BILITOT 0.42 0.37 0.39  PROT 6.4 6.8 6.2*  ALBUMIN 2.9* 3.3* 3.1*    CBC:  Recent Labs  12/31/14 0856  02/25/15 1130 03/22/15 03/24/15 0812  WBC 5.6  < > 4.2 4.0 4.1  NEUTROABS 4.1  --  2.5  --  2.5  HGB 7.9*  < > 8.2* 7.5* 8.0*  HCT 23.6*  < > 24.3* 22* 23.6*  MCV 104.9*  --  102.4*  --  99.2*  PLT 72*  < > 66* 51* 61*  < > = values in this interval not displayed.  Lab Results  Component Value Date   TSH 2.24 07/28/2014   Lab Results  Component Value Date   HGBA1C 8.3* 08/17/2014   No results found for: CHOL, HDL, LDLCALC, LDLDIRECT, TRIG, CHOLHDL  Significant Diagnostic Results since last visit: none  Patient Care Team: Estill Dooms, MD as PCP - General (Internal Medicine) Man Mast X, NP as Nurse Practitioner (Nurse Practitioner)  Assessment/Plan Problem List Items Addressed This Visit    Duodenal ulcer - Primary    Stable, no change in tx,  no c/o stomach pain, indigestion, or melena.        CHF (congestive heart failure)    Compensated, continue Furosemide 40mg  and Spironolactone 25mg  daily      Type 2 diabetes mellitus with diabetic chronic kidney disease    Continue Lantus 54 qd  Novolog 12u for CBG >450 instead of 5u with meals.       Depression    Mood is stable, continue Cymbalta 60mg  daily.       Dementia    Gradual  decline, off Namenda R>B in setting of bone marrow dysplasia . Continue SNF for care needs      Essential hypertension, benign    Controlled, continue Metoprolol 25mg  bid. off Losartan due to CKD. Creatinine 1.5-2.0.      Hypothyroidism    Continue Levothyroxine 50mcg, TSH 2.24 07/28/14      MDS (myelodysplastic syndrome)    F/u oncology, blood transfusion prn, no aggressive tx.           Family/ staff Communication: continue SNF for care needs.   Labs/tests ordered: none  ManXie Mast NP [facility] [address], [place] [address] On Call:  [phone] & follow prompts after 5pm & weekends Office Phone:  [phone] Office Fax:  [phone]

## 2015-04-18 NOTE — Assessment & Plan Note (Signed)
F/u oncology, blood transfusion prn, no aggressive tx.

## 2015-04-18 NOTE — Assessment & Plan Note (Signed)
Mood is stable, continue Cymbalta 60mg  daily.

## 2015-04-18 NOTE — Assessment & Plan Note (Signed)
Continue Lantus 54 qd Novolog 12u for CBG >450 instead of 5u with meals.

## 2015-04-21 ENCOUNTER — Other Ambulatory Visit (HOSPITAL_BASED_OUTPATIENT_CLINIC_OR_DEPARTMENT_OTHER): Payer: Medicare Other

## 2015-04-21 ENCOUNTER — Other Ambulatory Visit: Payer: Self-pay | Admitting: Hematology and Oncology

## 2015-04-21 ENCOUNTER — Ambulatory Visit (HOSPITAL_BASED_OUTPATIENT_CLINIC_OR_DEPARTMENT_OTHER): Payer: Medicare Other

## 2015-04-21 VITALS — BP 114/45 | HR 78 | Temp 97.0°F | Resp 18

## 2015-04-21 DIAGNOSIS — D631 Anemia in chronic kidney disease: Secondary | ICD-10-CM

## 2015-04-21 DIAGNOSIS — D469 Myelodysplastic syndrome, unspecified: Secondary | ICD-10-CM

## 2015-04-21 DIAGNOSIS — N189 Chronic kidney disease, unspecified: Secondary | ICD-10-CM

## 2015-04-21 LAB — CBC WITH DIFFERENTIAL/PLATELET
BASO%: 0.2 % (ref 0.0–2.0)
BASOS ABS: 0 10*3/uL (ref 0.0–0.1)
EOS ABS: 0.2 10*3/uL (ref 0.0–0.5)
EOS%: 3.6 % (ref 0.0–7.0)
HEMATOCRIT: 24.3 % — AB (ref 38.4–49.9)
HGB: 8.1 g/dL — ABNORMAL LOW (ref 13.0–17.1)
LYMPH%: 20.6 % (ref 14.0–49.0)
MCH: 32.9 pg (ref 27.2–33.4)
MCHC: 33.3 g/dL (ref 32.0–36.0)
MCV: 98.8 fL — ABNORMAL HIGH (ref 79.3–98.0)
MONO#: 0.3 10*3/uL (ref 0.1–0.9)
MONO%: 6.2 % (ref 0.0–14.0)
NEUT#: 3.3 10*3/uL (ref 1.5–6.5)
NEUT%: 69.4 % (ref 39.0–75.0)
Platelets: 84 10*3/uL — ABNORMAL LOW (ref 140–400)
RBC: 2.46 10*6/uL — ABNORMAL LOW (ref 4.20–5.82)
RDW: 25 % — AB (ref 11.0–14.6)
WBC: 4.7 10*3/uL (ref 4.0–10.3)
lymph#: 1 10*3/uL (ref 0.9–3.3)
nRBC: 0 % (ref 0–0)

## 2015-04-21 LAB — TECHNOLOGIST REVIEW

## 2015-04-21 LAB — PREPARE RBC (CROSSMATCH)

## 2015-04-21 LAB — HOLD TUBE, BLOOD BANK

## 2015-04-21 MED ORDER — ACETAMINOPHEN 325 MG PO TABS
ORAL_TABLET | ORAL | Status: AC
  Filled 2015-04-21: qty 2

## 2015-04-21 MED ORDER — DIPHENHYDRAMINE HCL 25 MG PO CAPS
25.0000 mg | ORAL_CAPSULE | Freq: Once | ORAL | Status: AC
  Administered 2015-04-21: 25 mg via ORAL

## 2015-04-21 MED ORDER — DIPHENHYDRAMINE HCL 25 MG PO CAPS
ORAL_CAPSULE | ORAL | Status: AC
  Filled 2015-04-21: qty 2

## 2015-04-21 MED ORDER — SODIUM CHLORIDE 0.9 % IV SOLN
250.0000 mL | Freq: Once | INTRAVENOUS | Status: AC
  Administered 2015-04-21: 250 mL via INTRAVENOUS

## 2015-04-21 MED ORDER — ACETAMINOPHEN 325 MG PO TABS
650.0000 mg | ORAL_TABLET | Freq: Once | ORAL | Status: AC
  Administered 2015-04-21: 650 mg via ORAL

## 2015-04-21 NOTE — Patient Instructions (Signed)

## 2015-04-22 LAB — TYPE AND SCREEN
ABO/RH(D): O POS
Antibody Screen: NEGATIVE
Unit division: 0
Unit division: 0

## 2015-04-25 ENCOUNTER — Encounter: Payer: Self-pay | Admitting: Nurse Practitioner

## 2015-04-25 ENCOUNTER — Non-Acute Institutional Stay (SKILLED_NURSING_FACILITY): Payer: Medicare Other | Admitting: Nurse Practitioner

## 2015-04-25 DIAGNOSIS — I10 Essential (primary) hypertension: Secondary | ICD-10-CM

## 2015-04-25 DIAGNOSIS — F32A Depression, unspecified: Secondary | ICD-10-CM

## 2015-04-25 DIAGNOSIS — D469 Myelodysplastic syndrome, unspecified: Secondary | ICD-10-CM | POA: Diagnosis not present

## 2015-04-25 DIAGNOSIS — E039 Hypothyroidism, unspecified: Secondary | ICD-10-CM

## 2015-04-25 DIAGNOSIS — E1122 Type 2 diabetes mellitus with diabetic chronic kidney disease: Secondary | ICD-10-CM

## 2015-04-25 DIAGNOSIS — F0390 Unspecified dementia without behavioral disturbance: Secondary | ICD-10-CM | POA: Diagnosis not present

## 2015-04-25 DIAGNOSIS — F329 Major depressive disorder, single episode, unspecified: Secondary | ICD-10-CM

## 2015-04-25 DIAGNOSIS — N390 Urinary tract infection, site not specified: Secondary | ICD-10-CM | POA: Diagnosis not present

## 2015-04-25 DIAGNOSIS — N189 Chronic kidney disease, unspecified: Secondary | ICD-10-CM

## 2015-04-25 DIAGNOSIS — I5040 Unspecified combined systolic (congestive) and diastolic (congestive) heart failure: Secondary | ICD-10-CM

## 2015-04-25 NOTE — Progress Notes (Signed)
Patient ID: [name] [name], male   DOB: September 24, 1919, 81 y.o.   MRN: [id number]  Location:  SNF [facility] Provider:  Marlana Latus NP  Code Status:  DNR Goals of care: Advanced Directive information    Chief Complaint  Patient presents with  . Medical Management of Chronic Issues  . Acute Visit    UTI     HPI: Patient is a 81 y.o. male seen in the SNF at [facility] today for evaluation of UTI, urine culture P. Mirabilis >100,000c/ml, 7 day course of Amoxicillin 500mg  bid started 04/24/15, the patient has returned to his usual state of health.   Review of Systems:  Review of Systems  Constitutional: Negative for fever and chills.  HENT: Positive for hearing loss. Negative for congestion, ear discharge and ear pain.   Eyes: Negative for pain, discharge and redness.  Respiratory: Negative for cough, shortness of breath and wheezing.   Cardiovascular: Positive for leg swelling. Negative for chest pain and palpitations.       Edema 1+ BLE  Gastrointestinal: Negative for nausea, vomiting, abdominal pain and constipation.  Genitourinary: Positive for frequency. Negative for dysuria and urgency.  Musculoskeletal: Negative for myalgias, back pain and neck pain.  Skin: Negative for rash.  Neurological: Negative for dizziness, tremors, seizures, weakness and headaches.  Endo/Heme/Allergies: Does not bruise/bleed easily.  Psychiatric/Behavioral: Positive for memory loss. Negative for suicidal ideas and hallucinations. The patient is nervous/anxious.     Past Medical History  Diagnosis Date  . Hypothyroidism   . Hypertension   . CHF (congestive heart failure)   . Diabetes mellitus   . Coronary artery disease   . Restless leg syndrome   . Hyperlipemia   . Renal disease     stage II  . Sleep apnea   . Cellulitis and abscess of leg   . Obesity   . Duodenal ulcer, perforated   . Diverticulosis   . Internal hemorrhoids   . CAD (coronary artery disease)   . Anemia   . Duodenal  ulcer   . Allergy   . Blood transfusion   . Cataract   . Tuberculosis     as a child  . Contact dermatitis and other eczema, due to unspecified cause 10/06/2012  . Senile dementia with depressive features 05/26/2012  . Hyposmolality and/or hyponatremia 04/02/2012  . Diarrhea 03/26/2012  . Hemorrhage of rectum and anus 03/25/2012  . Pneumonia, organism unspecified 03/12/2012  . 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 Folate-deficiency anemia 01/07/2012  . Thrombocytopenia, unspecified 09/26/2011  . Loss of weight 09/11/2011  . Dysphagia, unspecified(787.20) 06/28/2011  . Chronic kidney disease, stage III (moderate) 03/22/2011  . Cramp of limb 08/10/2010  . Chronic kidney disease, stage II (mild) 03/22/2011  . Personal history of fall 05/05/2009  . Unspecified vitamin D deficiency 02/24/2009  . Blood in stool 11/18/2008  . Dermatophytosis of the body 06/23/2007  . Obesity, unspecified 06/23/2007  . Unspecified tinnitus 06/23/2007  . Edema 06/23/2007  . Shortness of breath 06/23/2007  . Flatulence, eructation, and gas pain 06/23/2007  . Other general symptoms(780.99) 06/23/2007  . Memory loss 08/13/2003  . Screening PSA (prostate specific antigen) 07/27/2014  . Prostate cancer screening 07/27/2014  . Weight loss 07/27/2014    Patient Active Problem List   Diagnosis Date Noted  . Bronchitis 12/31/2014  . Screening PSA (prostate specific antigen) 07/27/2014  . Prostate cancer screening 07/27/2014  . Weight loss 07/27/2014  . Complaint of debility and  malaise 07/26/2014  . MDS (myelodysplastic syndrome) 06/07/2014  . Closed nondisplaced fracture of fifth left metatarsal bone 03/24/2014  . Foot pain, left 03/22/2014  . Hyperkalemia 10/21/2013  . Thrombocytopenia 08/17/2013  . Infection of urinary tract 06/04/2013  . Unspecified gastritis and gastroduodenitis without mention of hemorrhage 04/15/2013  . Anemia in chronic kidney disease 01/05/2013  .  Hypothyroidism 12/10/2012  . Fall at nursing home 10/22/2012  . Depression 10/22/2012  . Dementia 10/22/2012  . Essential hypertension, benign 10/22/2012  . Duodenal ulcer 09/13/2011  . CHF (congestive heart failure) 09/13/2011  . CKD (chronic kidney disease) 09/13/2011  . Respiratory failure 09/13/2011  . Type 2 diabetes mellitus with diabetic chronic kidney disease 09/13/2011  . Anemia 12/13/2008  . BLOOD IN STOOL 12/13/2008    Allergies  Allergen Reactions  . Enalapril Other (See Comments)    cough  . Metformin And Related Diarrhea  . Milk-Related Compounds Other (See Comments)    Doesn't remember but its a mild reaction    Medications: Patient's Medications  New Prescriptions   No medications on file  Previous Medications   ACETAMINOPHEN (TYLENOL) 325 MG TABLET    Take 650 mg by mouth every 4 (four) hours as needed (for pain).    DULOXETINE (CYMBALTA) 60 MG CAPSULE    Take 60 mg by mouth daily.   FAMOTIDINE (PEPCID) 20 MG TABLET       FLUTICASONE-SALMETEROL (ADVAIR HFA) 45-21 MCG/ACT INHALER    Inhale 2 puffs into the lungs every 12 (twelve) hours.    FOLIC ACID (FOLVITE) 1 MG TABLET    Take 1 mg by mouth daily.    FUROSEMIDE (LASIX) 40 MG TABLET    Take 40 mg by mouth daily.   INSULIN ASPART (NOVOLOG) 100 UNIT/ML INJECTION    Inject 5 Units into the skin 3 (three) times daily before meals. For CBG>150   INSULIN GLARGINE (LANTUS) 100 UNIT/ML INJECTION    Inject 54 Units into the skin at bedtime.   LEVOTHYROXINE (SYNTHROID, LEVOTHROID) 75 MCG TABLET    Take 75 mcg by mouth daily.    METOPROLOL TARTRATE (LOPRESSOR) 25 MG TABLET    Take 25 mg by mouth 2 (two) times daily.   OMEPRAZOLE (PRILOSEC) 20 MG CAPSULE    Take 1 capsule (20 mg total) by mouth daily.   SPIRONOLACTONE (ALDACTONE) 25 MG TABLET    Take 25 mg by mouth daily.   TIOTROPIUM (SPIRIVA) 18 MCG INHALATION CAPSULE    Place 18 mcg into inhaler and inhale daily. 2 puffs daily   VITAMIN B-12 (CYANOCOBALAMIN) 1000 MCG  TABLET    Take 1,000 mcg by mouth daily.  Modified Medications   No medications on file  Discontinued Medications   No medications on file    Physical Exam: Filed Vitals:   04/25/15 1132  BP: 124/64  Pulse: 78  Temp: 97.6 F (36.4 C)  TempSrc: Tympanic  Resp: 18   There is no weight on file to calculate BMI.  Physical Exam  Constitutional: He is oriented to person, place, and time. He appears well-developed and well-nourished. No distress.  HENT:  Head: Normocephalic and atraumatic.  Right Ear: External ear normal.  Left Ear: External ear normal.  Nose: Nose normal.  Mouth/Throat: Oropharynx is clear and moist. No oropharyngeal exudate.  Eyes: Conjunctivae and EOM are normal. Pupils are equal, round, and reactive to light.  Neck: Normal range of motion. Neck supple. No JVD present. No thyromegaly present.  Cardiovascular: Normal rate, regular rhythm and  normal heart sounds.   No murmur heard. Pulmonary/Chest: Effort normal. No respiratory distress. He has no wheezes. He has rales in the right lower field and the left lower field. He exhibits no tenderness.  Abdominal: Soft. Bowel sounds are normal. He exhibits no distension and no mass. There is no tenderness. There is no rebound.  Musculoskeletal: Normal range of motion. He exhibits edema (trace in BLE). He exhibits no tenderness.  1+ edema BLE  Lymphadenopathy:    He has no cervical adenopathy.  Neurological: He is alert and oriented to person, place, and time. He has normal reflexes. No cranial nerve deficit. Coordination normal.  Skin: Skin is warm and dry. No rash noted. He is not diaphoretic. No erythema.  Psychiatric: Thought content normal. His mood appears anxious. His affect is angry and inappropriate. His affect is not blunt and not labile. His speech is not delayed, not tangential and not slurred. He is agitated and aggressive. He is not hyperactive, not slowed, not withdrawn, not actively hallucinating and not  combative. Cognition and memory are impaired. He exhibits a depressed mood. He is communicative. He exhibits abnormal recent memory. He is attentive.    Labs reviewed: Basic Metabolic Panel:  Recent Labs  07/28/14 1035 08/11/14 0926 09/22/14 0905  NA 135* 134* 135*  K 5.3* 4.6 5.6*  CO2 25 25 25   GLUCOSE 185* 154* 210*  BUN 39.5* 41.3* 34.4*  CREATININE 1.7* 1.7* 1.7*  CALCIUM 8.9 8.6 9.0    Liver Function Tests:  Recent Labs  07/28/14 1035 08/11/14 0926 09/22/14 0905  AST 9 12 10   ALT 8 9 11   ALKPHOS 87 100 109  BILITOT 0.42 0.37 0.39  PROT 6.4 6.8 6.2*  ALBUMIN 2.9* 3.3* 3.1*    CBC:  Recent Labs  02/25/15 1130 03/22/15 03/24/15 0812 04/21/15 0806  WBC 4.2 4.0 4.1 4.7  NEUTROABS 2.5  --  2.5 3.3  HGB 8.2* 7.5* 8.0* 8.1*  HCT 24.3* 22* 23.6* 24.3*  MCV 102.4*  --  99.2* 98.8*  PLT 66* 51* 61* 84*    Lab Results  Component Value Date   TSH 2.24 07/28/2014   Lab Results  Component Value Date   HGBA1C 8.3* 08/17/2014   No results found for: CHOL, HDL, LDLCALC, LDLDIRECT, TRIG, CHOLHDL  Significant Diagnostic Results since last visit: none  Patient Care Team: Estill Dooms, MD as PCP - General (Internal Medicine) Man Mast X, NP as Nurse Practitioner (Nurse Practitioner)  Assessment/Plan Problem List Items Addressed This Visit    Type 2 diabetes mellitus with diabetic chronic kidney disease    Continue Lantus 54 qd Novolog 12u for CBG >450 instead of 5u with meals.       MDS (myelodysplastic syndrome)     F/u Oncology. 03/02/15 Hgb 7.5, plt 51      Infection of urinary tract - Primary    01/25/14 P. Mirabilis Cipro 250mg  bid x 10 days.  04/24/15 P. Mirabilis Amoxicillin 500mg  bid x 7       Hypothyroidism    Continue Levothyroxine 9mcg, TSH 2.24 07/28/14      Essential hypertension, benign    Controlled, continue Metoprolol 25mg  bid. off Losartan due to CKD. Creatinine 1.5-2.0      Depression    Mood is stable, continue Cymbalta 60mg   daily      Dementia    Gradual decline, off Namenda R>B in setting of bone marrow dysplasia . Continue SNF for care needs      CHF (  congestive heart failure)    Compensated clinically, continue Furosemide 40mg  and Spironolactone 25mg  daily          Family/ staff Communication: continue to observe the patient.   Labs/tests ordered: urine culture 04/24/15 positive for UTI  [facility] Mast NP [facility] [address], [place] [address] On Call:  [phone] & follow prompts after 5pm & weekends Office Phone:  [phone] Office Fax:  [phone]

## 2015-04-25 NOTE — Assessment & Plan Note (Signed)
Controlled, continue Metoprolol 25mg  bid. off Losartan due to CKD. Creatinine 1.5-2.0

## 2015-04-25 NOTE — Assessment & Plan Note (Signed)
F/u Oncology. 03/02/15 Hgb 7.5, plt 51

## 2015-04-25 NOTE — Assessment & Plan Note (Signed)
Compensated clinically, continue Furosemide 40mg  and Spironolactone 25mg  daily

## 2015-04-25 NOTE — Assessment & Plan Note (Signed)
Continue Levothyroxine 27mcg, TSH 2.24 07/28/14

## 2015-04-25 NOTE — Assessment & Plan Note (Signed)
Mood is stable, continue Cymbalta 60mg  daily

## 2015-04-25 NOTE — Assessment & Plan Note (Signed)
Continue Lantus 54 qd Novolog 12u for CBG >450 instead of 5u with meals.

## 2015-04-25 NOTE — Assessment & Plan Note (Signed)
Gradual decline, off Namenda R>B in setting of bone marrow dysplasia . Continue SNF for care needs

## 2015-04-25 NOTE — Assessment & Plan Note (Signed)
01/25/14 P. Mirabilis Cipro 250mg  bid x 10 days.  04/24/15 P. Mirabilis Amoxicillin 500mg  bid x 7

## 2015-05-02 ENCOUNTER — Ambulatory Visit (HOSPITAL_COMMUNITY)
Admission: RE | Admit: 2015-05-02 | Discharge: 2015-05-02 | Disposition: A | Discharge status: Home / Self Care | Payer: Medicare Other | Source: Ambulatory Visit | Attending: Hematology | Admitting: Hematology

## 2015-05-02 DIAGNOSIS — D469 Myelodysplastic syndrome, unspecified: Secondary | ICD-10-CM | POA: Insufficient documentation

## 2015-05-19 ENCOUNTER — Other Ambulatory Visit (HOSPITAL_BASED_OUTPATIENT_CLINIC_OR_DEPARTMENT_OTHER): Payer: Medicare Other

## 2015-05-19 ENCOUNTER — Encounter: Payer: Self-pay | Admitting: General Practice

## 2015-05-19 ENCOUNTER — Other Ambulatory Visit: Payer: Self-pay | Admitting: Hematology and Oncology

## 2015-05-19 ENCOUNTER — Other Ambulatory Visit: Payer: Self-pay | Admitting: *Deleted

## 2015-05-19 ENCOUNTER — Ambulatory Visit (HOSPITAL_BASED_OUTPATIENT_CLINIC_OR_DEPARTMENT_OTHER): Payer: Medicare Other

## 2015-05-19 VITALS — BP 105/50 | HR 70 | Temp 97.2°F | Resp 18

## 2015-05-19 DIAGNOSIS — D469 Myelodysplastic syndrome, unspecified: Secondary | ICD-10-CM

## 2015-05-19 DIAGNOSIS — N189 Chronic kidney disease, unspecified: Secondary | ICD-10-CM

## 2015-05-19 DIAGNOSIS — D631 Anemia in chronic kidney disease: Secondary | ICD-10-CM

## 2015-05-19 LAB — CBC WITH DIFFERENTIAL/PLATELET
BASO%: 0.2 % (ref 0.0–2.0)
Basophils Absolute: 0 10*3/uL (ref 0.0–0.1)
EOS%: 4.1 % (ref 0.0–7.0)
Eosinophils Absolute: 0.2 10*3/uL (ref 0.0–0.5)
HEMATOCRIT: 23.3 % — AB (ref 38.4–49.9)
HEMOGLOBIN: 7.7 g/dL — AB (ref 13.0–17.1)
LYMPH#: 1.3 10*3/uL (ref 0.9–3.3)
LYMPH%: 27.4 % (ref 14.0–49.0)
MCH: 33.3 pg (ref 27.2–33.4)
MCHC: 33 g/dL (ref 32.0–36.0)
MCV: 100.9 fL — ABNORMAL HIGH (ref 79.3–98.0)
MONO#: 0.5 10*3/uL (ref 0.1–0.9)
MONO%: 10.5 % (ref 0.0–14.0)
NEUT%: 57.8 % (ref 39.0–75.0)
NEUTROS ABS: 2.7 10*3/uL (ref 1.5–6.5)
NRBC: 1 % — AB (ref 0–0)
Platelets: 63 10*3/uL — ABNORMAL LOW (ref 140–400)
RBC: 2.31 10*6/uL — ABNORMAL LOW (ref 4.20–5.82)
RDW: 24.3 % — AB (ref 11.0–14.6)
WBC: 4.7 10*3/uL (ref 4.0–10.3)

## 2015-05-19 LAB — HOLD TUBE, BLOOD BANK

## 2015-05-19 LAB — PREPARE RBC (CROSSMATCH)

## 2015-05-19 MED ORDER — SODIUM CHLORIDE 0.9 % IV SOLN
250.0000 mL | Freq: Once | INTRAVENOUS | Status: AC
  Administered 2015-05-19: 250 mL via INTRAVENOUS

## 2015-05-19 MED ORDER — ACETAMINOPHEN 325 MG PO TABS
ORAL_TABLET | ORAL | Status: AC
  Filled 2015-05-19: qty 2

## 2015-05-19 MED ORDER — DIPHENHYDRAMINE HCL 25 MG PO CAPS
25.0000 mg | ORAL_CAPSULE | Freq: Once | ORAL | Status: AC
  Administered 2015-05-19: 25 mg via ORAL

## 2015-05-19 MED ORDER — ACETAMINOPHEN 325 MG PO TABS
650.0000 mg | ORAL_TABLET | Freq: Once | ORAL | Status: AC
  Administered 2015-05-19: 650 mg via ORAL

## 2015-05-19 MED ORDER — DIPHENHYDRAMINE HCL 25 MG PO CAPS
ORAL_CAPSULE | ORAL | Status: AC
  Filled 2015-05-19: qty 1

## 2015-05-19 NOTE — Progress Notes (Signed)
Spiritual Care Note  Met with wife [name] [name] in lobby, allowing for private support.  She is relieved to have less frequent, and earlier, appointments due to transportation limitations.  She was in steadier spirits than in some recent past visits.  Anticipating a Thanksgiving visit from their children is a source of joy.  Per pt, he was feeling better today and, as usual, appeared to be in good spirits, with no complaints.  Continuing to follow for support.  [name] [name], [place], [facility] Pager [phone] Voicemail  [phone]

## 2015-05-20 ENCOUNTER — Encounter: Payer: Self-pay | Admitting: Internal Medicine

## 2015-05-20 ENCOUNTER — Non-Acute Institutional Stay (SKILLED_NURSING_FACILITY): Payer: Medicare Other | Admitting: Internal Medicine

## 2015-05-20 DIAGNOSIS — N189 Chronic kidney disease, unspecified: Secondary | ICD-10-CM

## 2015-05-20 DIAGNOSIS — F0390 Unspecified dementia without behavioral disturbance: Secondary | ICD-10-CM

## 2015-05-20 DIAGNOSIS — N390 Urinary tract infection, site not specified: Secondary | ICD-10-CM

## 2015-05-20 DIAGNOSIS — I10 Essential (primary) hypertension: Secondary | ICD-10-CM | POA: Diagnosis not present

## 2015-05-20 DIAGNOSIS — E1122 Type 2 diabetes mellitus with diabetic chronic kidney disease: Secondary | ICD-10-CM

## 2015-05-20 DIAGNOSIS — N183 Chronic kidney disease, stage 3 (moderate): Secondary | ICD-10-CM

## 2015-05-20 DIAGNOSIS — D631 Anemia in chronic kidney disease: Secondary | ICD-10-CM

## 2015-05-20 DIAGNOSIS — I5040 Unspecified combined systolic (congestive) and diastolic (congestive) heart failure: Secondary | ICD-10-CM | POA: Diagnosis not present

## 2015-05-20 DIAGNOSIS — E039 Hypothyroidism, unspecified: Secondary | ICD-10-CM

## 2015-05-20 LAB — TYPE AND SCREEN
ABO/RH(D): O POS
ANTIBODY SCREEN: NEGATIVE
UNIT DIVISION: 0
UNIT DIVISION: 0

## 2015-05-20 NOTE — Progress Notes (Signed)
Patient ID: [name] [name], male   DOB: [birth year], 81 y.o.   MRN: [id number]    HISTORY AND PHYSICAL  Location:  [place] Room Number: 21 Place of Service: SNF (31)   Extended Emergency Contact Information Primary Emergency Contact: Selvidge,Dolores Address: [address]          [place], [place] [address] [place] Phone: [phone] Mobile Phone: [phone] Relation: Spouse  Advanced Directive information Does patient have an advance directive?: Yes, Type of Advance Directive: Living will;Out of facility DNR (pink MOST or yellow form), Pre-existing out of facility DNR order (yellow form or pink MOST form): Yellow form placed in chart (order not valid for inpatient use)  Chief Complaint  Patient presents with  . Medical Management of Chronic Issues    HPI:  Urinary tract infection without hematuria, site unspecified - 04/21/2015 Proteus mirabilis treated with amoxicillin.  Dementia, without behavioral disturbance - basically unchanged over the last year  Essential hypertension, benign - controlled  Hypothyroidism, unspecified hypothyroidism type - remains on levothyroxine 75 g daily  Anemia in chronic kidney disease - persistent anemia associated with myelodysplastic syndrome as well as chronic kidney disease  Combined systolic and diastolic congestive heart failure, unspecified congestive heart failure chronicity (HCC) - persistent edema of the lower legs, however he is not particular short of breath and lung sound clear. Appears to be compensated.  CKD (chronic kidney disease), stage 3 (moderate) - unchanged  Type 2 diabetes mellitus with stage 3 chronic kidney disease, without long-term current use of insulin (HCC) - stable    Past Medical History  Diagnosis Date  . Hypothyroidism   . Hypertension   . CHF (congestive heart failure) ([place])   . Diabetes mellitus   . Coronary artery disease   . Restless  leg syndrome   . Hyperlipemia   . Renal disease     stage II  . Sleep apnea   . Cellulitis and abscess of leg   . Obesity   . Duodenal ulcer, perforated ([place])   . Diverticulosis   . Internal hemorrhoids   . CAD (coronary artery disease)   . Anemia   . Duodenal ulcer   . Allergy   . Blood transfusion   . Cataract   . Tuberculosis     as a child  . Contact dermatitis and other eczema, due to unspecified cause 10/06/2012  . Senile dementia with depressive features 05/26/2012  . Hyposmolality and/or hyponatremia 04/02/2012  . Diarrhea 03/26/2012  . Hemorrhage of rectum and anus 03/25/2012  . Pneumonia, organism unspecified 03/12/2012  . 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 Folate-deficiency anemia 01/07/2012  . Thrombocytopenia, unspecified ([place]) 09/26/2011  . Loss of weight 09/11/2011  . Dysphagia, unspecified(787.20) 06/28/2011  . Chronic kidney disease, stage III (moderate) 03/22/2011  . Cramp of limb 08/10/2010  . Chronic kidney disease, stage II (mild) 03/22/2011  . Personal history of fall 05/05/2009  . Unspecified vitamin D deficiency 02/24/2009  . Blood in stool 11/18/2008  . Dermatophytosis of the body 06/23/2007  . Obesity, unspecified 06/23/2007  . Unspecified tinnitus 06/23/2007  . Edema 06/23/2007  . Shortness of breath 06/23/2007  . Flatulence, eructation, and gas pain 06/23/2007  . Other general symptoms(780.99) 06/23/2007  . Memory loss 08/13/2003  . Screening PSA (prostate specific antigen) 07/27/2014  . Prostate cancer screening 07/27/2014  . Weight loss 07/27/2014    Past Surgical History  Procedure Laterality  Date  . Laparotomy  09/12/2011    Procedure: EXPLORATORY LAPAROTOMY;  Surgeon: Judieth Keens, DO;  Location: [facility];  Service: General;  Laterality: N/A;  REPAIR PERFORATED ULCER  . Gastrostomy  09/12/2011    Procedure: GASTROSTOMY;  Surgeon: Judieth Keens, DO;  Location: [facility]S;  Service: General;   Laterality: N/A;  BIOPSY DUODENAL ULCER  . Shoulder surgery  [place], [place]  . Jejunostomy feeding tube      removed  . Back surgery  1942  . Eye surgery  [id number]    cataracts  . Coronary artery bypass graft  2201    Patient Care Team: Estill Dooms, MD as PCP - General (Internal Medicine) Man Mast X, NP as Nurse Practitioner (Nurse Practitioner)  Social History   Social History  . Marital Status: Married    Spouse Name: N/A  . Number of Children: 2  . Years of Education: N/A   Occupational History  . retired Pharmacist, hospital    Social History Main Topics  . Smoking status: Former Smoker    Types: Cigarettes  . Smokeless tobacco: Never Used  . Alcohol Use: No  . Drug Use: No  . Sexual Activity: Not on file   Other Topics Concern  . Not on file   Social History Narrative   Lives at [facility] since 09/24/2011   Married   Hosie Poisson   Former smoker   Alcohol none   DNR, LW       reports that he has quit smoking. His smoking use included Cigarettes. He has never used smokeless tobacco. He reports that he does not drink alcohol or use illicit drugs.  Family History  Problem Relation Age of Onset  . Stroke Father    Family Status  Relation Status Death Age  . Father Deceased   . Mother Deceased   . Sister Alive   . Daughter Alive   . Son Alive   . Sister Alive     Immunization History  Administered Date(s) Administered  . Influenza Whole 07/30/2006  . Influenza-Unspecified 06/02/2014, 04/20/2015  . Td 07/30/2006    Allergies  Allergen Reactions  . Enalapril Other (See Comments)    cough  . Metformin And Related Diarrhea  . Milk-Related Compounds Other (See Comments)    Doesn't remember but its a mild reaction    Medications: Patient's Medications  New Prescriptions   No medications on file  Previous Medications   ACETAMINOPHEN (TYLENOL) 325 MG TABLET    Take 650 mg by mouth every 4 (four) hours as needed (for pain).     DULOXETINE (CYMBALTA) 60 MG CAPSULE    Take 60 mg by mouth daily.   FAMOTIDINE (PEPCID) 20 MG TABLET       FLUTICASONE-SALMETEROL (ADVAIR HFA) 45-21 MCG/ACT INHALER    Inhale 2 puffs into the lungs every 12 (twelve) hours.    FOLIC ACID (FOLVITE) 1 MG TABLET    Take 1 mg by mouth daily.    FUROSEMIDE (LASIX) 40 MG TABLET    Take 40 mg by mouth daily.   GUAIFENESIN (ROBITUSSIN) 100 MG/5ML LIQUID    Take 200 mg by mouth. Give 53ml every 6 hours as needed for cough   INSULIN ASPART (NOVOLOG) 100 UNIT/ML INJECTION    Inject 5 Units into the skin 3 (three) times daily before meals. For CBG>150   INSULIN GLARGINE (LANTUS) 100 UNIT/ML INJECTION    Inject 54 Units into the skin at  bedtime.   LEVOTHYROXINE (SYNTHROID, LEVOTHROID) 75 MCG TABLET    Take 75 mcg by mouth daily.    METOPROLOL TARTRATE (LOPRESSOR) 25 MG TABLET    Take 25 mg by mouth 2 (two) times daily.   SPIRONOLACTONE (ALDACTONE) 25 MG TABLET    Take 25 mg by mouth daily.   TIOTROPIUM (SPIRIVA) 18 MCG INHALATION CAPSULE    Place 18 mcg into inhaler and inhale daily. 2 puffs daily   VITAMIN B-12 (CYANOCOBALAMIN) 1000 MCG TABLET    Take 1,000 mcg by mouth daily.  Modified Medications   No medications on file  Discontinued Medications   OMEPRAZOLE (PRILOSEC) 20 MG CAPSULE    Take 1 capsule (20 mg total) by mouth daily.    Review of Systems  Constitutional: Negative for fever and chills.  HENT: Positive for hearing loss. Negative for congestion, ear discharge and ear pain.   Eyes: Negative for pain, discharge and redness.  Respiratory: Negative for cough, shortness of breath and wheezing.   Cardiovascular: Positive for leg swelling. Negative for chest pain and palpitations.       Edema 1+ BLE  Gastrointestinal: Negative for nausea, vomiting, abdominal pain and constipation.  Genitourinary: Positive for frequency. Negative for dysuria and urgency.  Musculoskeletal: Negative for myalgias, back pain and neck pain.  Skin: Negative for rash.    Neurological: Negative for dizziness, tremors, seizures, weakness and headaches.  Hematological: Does not bruise/bleed easily.  Psychiatric/Behavioral: Negative for suicidal ideas and hallucinations. The patient is nervous/anxious.     Filed Vitals:   05/20/15 1427  BP: 152/67  Pulse: 78  Temp: 96.9 F (36.1 C)  Resp: 20  Height: 5' 8.5" (1.74 m)  Weight: 225 lb (102.059 kg)   Body mass index is 33.71 kg/(m^2).  Physical Exam  Constitutional: He is oriented to person, place, and time. He appears well-developed and well-nourished. No distress.  HENT:  Head: Normocephalic and atraumatic.  Right Ear: External ear normal.  Left Ear: External ear normal.  Nose: Nose normal.  Mouth/Throat: Oropharynx is clear and moist. No oropharyngeal exudate.  Eyes: Conjunctivae and EOM are normal. Pupils are equal, round, and reactive to light.  Neck: Normal range of motion. Neck supple. No JVD present. No thyromegaly present.  Cardiovascular: Normal rate, regular rhythm and normal heart sounds.   No murmur heard. Pulmonary/Chest: Effort normal. No respiratory distress. He has no wheezes. He has rales in the right lower field and the left lower field. He exhibits no tenderness.  Abdominal: Soft. Bowel sounds are normal. He exhibits no distension and no mass. There is no tenderness. There is no rebound.  Musculoskeletal: Normal range of motion. He exhibits edema (trace in BLE). He exhibits no tenderness.  1+ edema BLE  Lymphadenopathy:    He has no cervical adenopathy.  Neurological: He is alert and oriented to person, place, and time. He has normal reflexes. No cranial nerve deficit. Coordination normal.  Skin: Skin is warm and dry. No rash noted. He is not diaphoretic. No erythema.  Psychiatric: Thought content normal. His mood appears anxious. His affect is angry and inappropriate. His affect is not blunt and not labile. His speech is not delayed, not tangential and not slurred. He is agitated  and aggressive. He is not hyperactive, not slowed, not withdrawn, not actively hallucinating and not combative. Cognition and memory are impaired. He exhibits a depressed mood. He is communicative. He exhibits abnormal recent memory. He is attentive.    Labs reviewed: Lab Summary Latest Ref  Rng 05/19/2015 04/21/2015 03/24/2015 03/22/2015 02/25/2015 01/25/2015 01/11/2015  Hemoglobin 13.0 - 17.1 g/dL 7.7(L) 8.1(L) 8.0(L) 7.5(A) 8.2(L) 7.9(A) 10.1(A)  Hematocrit 38.4 - 49.9 % 23.3(L) 24.3(L) 23.6(L) 22(A) 24.3(L) 22(A) 29(A)  White count 4.0 - 10.3 10e3/uL 4.7 4.7 4.1 4.0 4.2 4.3 4.8  Platelet count 140 - 400 10e3/uL 63(L) 84(L) 61(L) 51(A) 66(L) 23(A) 103(A)  Sodium - (None) (None) (None) (None) (None) (None) (None)  Potassium - (None) (None) (None) (None) (None) (None) (None)  Calcium - (None) (None) (None) (None) (None) (None) (None)  Phosphorus - (None) (None) (None) (None) (None) (None) (None)  Creatinine - (None) (None) (None) (None) (None) (None) (None)  AST - (None) (None) (None) (None) (None) (None) (None)  Alk Phos - (None) (None) (None) (None) (None) (None) (None)  Bilirubin - (None) (None) (None) (None) (None) (None) (None)  Glucose - (None) (None) (None) (None) (None) (None) (None)  Cholesterol - (None) (None) (None) (None) (None) (None) (None)  HDL cholesterol - (None) (None) (None) (None) (None) (None) (None)  Triglycerides - (None) (None) (None) (None) (None) (None) (None)  LDL Direct - (None) (None) (None) (None) (None) (None) (None)  LDL Calc - (None) (None) (None) (None) (None) (None) (None)  Total protein - (None) (None) (None) (None) (None) (None) (None)  Albumin - (None) (None) (None) (None) (None) (None) (None)   Lab Results  Component Value Date   BUN 34.4* 09/22/2014   Lab Results  Component Value Date   HGBA1C 8.3* 08/17/2014   Lab Results  Component Value Date   TSH 2.24 07/28/2014   Assessment and plan  1. Urinary tract infection without hematuria, site  unspecified Treatment is been completed  2. Dementia, without behavioral disturbance Unchanged  3. Essential hypertension, benign Controlled. Although SPEP was mildly elevated today, chart indicates most are in the normal range. -CMP  4. Hypothyroidism, unspecified hypothyroidism type -TSH  5. Anemia in chronic kidney disease -CBC  6. Combined systolic and diastolic congestive heart failure, unspecified congestive heart failure chronicity ([place]) Compensated  7. CKD (chronic kidney disease), stage 3 (moderate) -CMP  8. Type 2 diabetes mellitus with stage 3 chronic kidney disease, without long-term current use of insulin (HCC) -A1c, CMP

## 2015-05-24 LAB — BASIC METABOLIC PANEL
BUN: 47 mg/dL — AB (ref 4–21)
CREATININE: 1.5 mg/dL — AB (ref 0.6–1.3)
GLUCOSE: 210 mg/dL
POTASSIUM: 4.6 mmol/L (ref 3.4–5.3)
SODIUM: 134 mmol/L — AB (ref 137–147)

## 2015-05-24 LAB — TSH: TSH: 3.43 u[IU]/mL (ref 0.41–5.90)

## 2015-05-24 LAB — HEPATIC FUNCTION PANEL
ALT: 16 U/L (ref 10–40)
AST: 13 U/L — AB (ref 14–40)
Alkaline Phosphatase: 79 U/L (ref 25–125)
Bilirubin, Total: 0.4 mg/dL

## 2015-05-24 LAB — HEMOGLOBIN A1C: Hgb A1c MFr Bld: 8.2 % — AB (ref 4.0–6.0)

## 2015-05-30 ENCOUNTER — Encounter: Payer: Self-pay | Admitting: Nurse Practitioner

## 2015-05-30 ENCOUNTER — Non-Acute Institutional Stay (SKILLED_NURSING_FACILITY): Payer: Medicare Other | Admitting: Nurse Practitioner

## 2015-05-30 DIAGNOSIS — F0390 Unspecified dementia without behavioral disturbance: Secondary | ICD-10-CM | POA: Diagnosis not present

## 2015-05-30 DIAGNOSIS — E875 Hyperkalemia: Secondary | ICD-10-CM | POA: Diagnosis not present

## 2015-05-30 DIAGNOSIS — K269 Duodenal ulcer, unspecified as acute or chronic, without hemorrhage or perforation: Secondary | ICD-10-CM | POA: Diagnosis not present

## 2015-05-30 DIAGNOSIS — N189 Chronic kidney disease, unspecified: Secondary | ICD-10-CM | POA: Diagnosis not present

## 2015-05-30 DIAGNOSIS — I10 Essential (primary) hypertension: Secondary | ICD-10-CM

## 2015-05-30 DIAGNOSIS — D631 Anemia in chronic kidney disease: Secondary | ICD-10-CM | POA: Diagnosis not present

## 2015-05-30 DIAGNOSIS — I5040 Unspecified combined systolic (congestive) and diastolic (congestive) heart failure: Secondary | ICD-10-CM | POA: Diagnosis not present

## 2015-05-30 DIAGNOSIS — E1122 Type 2 diabetes mellitus with diabetic chronic kidney disease: Secondary | ICD-10-CM

## 2015-05-30 DIAGNOSIS — N183 Chronic kidney disease, stage 3 (moderate): Secondary | ICD-10-CM | POA: Diagnosis not present

## 2015-05-30 DIAGNOSIS — E039 Hypothyroidism, unspecified: Secondary | ICD-10-CM | POA: Diagnosis not present

## 2015-05-30 DIAGNOSIS — F32A Depression, unspecified: Secondary | ICD-10-CM

## 2015-05-30 DIAGNOSIS — F329 Major depressive disorder, single episode, unspecified: Secondary | ICD-10-CM | POA: Diagnosis not present

## 2015-05-30 DIAGNOSIS — D469 Myelodysplastic syndrome, unspecified: Secondary | ICD-10-CM

## 2015-05-30 NOTE — Assessment & Plan Note (Signed)
Compensated clinically, continue Furosemide 40mg  and Spironolactone 25mg  daily. Serum Na 134 05/24/15

## 2015-05-30 NOTE — Assessment & Plan Note (Signed)
Controlled, last TSH 3.433 05/24/15, continue Levothyroxine 38mcg daily.

## 2015-05-30 NOTE — Assessment & Plan Note (Signed)
Normalized, last serum K 4.6 05/24/15

## 2015-05-30 NOTE — Assessment & Plan Note (Signed)
Mood is stable, continue Cymbalta 60mg  daily

## 2015-05-30 NOTE — Assessment & Plan Note (Signed)
Continue Lantus 54 qd Novolog 12u for CBG >450 instead of 5u with meals. 05/24/15 Hgb A1c 8.2

## 2015-05-30 NOTE — Assessment & Plan Note (Signed)
F/u oncology, blood transfusion prn, no active bleeding.

## 2015-05-30 NOTE — Assessment & Plan Note (Signed)
Continue f/u oncology, prn blood transfusion.

## 2015-05-30 NOTE — Assessment & Plan Note (Signed)
Gradual decline, off Namenda R>B in setting of bone marrow dysplasia . Continue SNF for care needs

## 2015-05-30 NOTE — Assessment & Plan Note (Signed)
Chronic, baseline creatinine 1.5-2.0

## 2015-05-30 NOTE — Progress Notes (Signed)
Patient ID: [name] [name], male   DOB: [birth year], 81 y.o.   MRN: [id number]  Location:  SNF [facility] Provider:  Marlana Latus NP  Code Status:  DNR Goals of care: Advanced Directive information    Chief Complaint  Patient presents with  . Medical Management of Chronic Issues     HPI: Patient is a 81 y.o. male seen in the SNF at [facility] today for evaluation of chronic medical conditions, hx of dementia, PUD, anemia, T2DM, hypertension, and hypothyroidism.   Review of Systems:  Review of Systems  Constitutional: Negative for fever and chills.  HENT: Positive for hearing loss. Negative for congestion, ear discharge and ear pain.   Eyes: Negative for pain, discharge and redness.  Respiratory: Negative for cough, shortness of breath and wheezing.   Cardiovascular: Positive for leg swelling. Negative for chest pain and palpitations.       Edema 1+ BLE  Gastrointestinal: Negative for nausea, vomiting, abdominal pain and constipation.  Genitourinary: Positive for frequency. Negative for dysuria and urgency.  Musculoskeletal: Negative for myalgias, back pain and neck pain.  Skin: Negative for rash.  Neurological: Negative for dizziness, tremors, seizures, weakness and headaches.  Endo/Heme/Allergies: Does not bruise/bleed easily.  Psychiatric/Behavioral: Positive for memory loss. Negative for suicidal ideas and hallucinations. The patient is nervous/anxious.     Past Medical History  Diagnosis Date  . Hypothyroidism   . Hypertension   . CHF (congestive heart failure) ([place])   . Diabetes mellitus   . Coronary artery disease   . Restless leg syndrome   . Hyperlipemia   . Renal disease     stage II  . Sleep apnea   . Cellulitis and abscess of leg   . Obesity   . Duodenal ulcer, perforated ([place])   . Diverticulosis   . Internal hemorrhoids   . CAD (coronary artery disease)   . Anemia   . Duodenal ulcer   . Allergy   . Blood transfusion   . Cataract   . Tuberculosis      as a child  . Contact dermatitis and other eczema, due to unspecified cause 10/06/2012  . Senile dementia with depressive features 05/26/2012  . Hyposmolality and/or hyponatremia 04/02/2012  . Diarrhea 03/26/2012  . Hemorrhage of rectum and anus 03/25/2012  . Pneumonia, organism unspecified 03/12/2012  . 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 Folate-deficiency anemia 01/07/2012  . Thrombocytopenia, unspecified ([place]) 09/26/2011  . Loss of weight 09/11/2011  . Dysphagia, unspecified(787.20) 06/28/2011  . Chronic kidney disease, stage III (moderate) 03/22/2011  . Cramp of limb 08/10/2010  . Chronic kidney disease, stage II (mild) 03/22/2011  . Personal history of fall 05/05/2009  . Unspecified vitamin D deficiency 02/24/2009  . Blood in stool 11/18/2008  . Dermatophytosis of the body 06/23/2007  . Obesity, unspecified 06/23/2007  . Unspecified tinnitus 06/23/2007  . Edema 06/23/2007  . Shortness of breath 06/23/2007  . Flatulence, eructation, and gas pain 06/23/2007  . Other general symptoms(780.99) 06/23/2007  . Memory loss 08/13/2003  . Screening PSA (prostate specific antigen) 07/27/2014  . Prostate cancer screening 07/27/2014  . Weight loss 07/27/2014    Patient Active Problem List   Diagnosis Date Noted  . Screening PSA (prostate specific antigen) 07/27/2014  . Prostate cancer screening 07/27/2014  . MDS (myelodysplastic syndrome) ([place]) 06/07/2014  . Closed nondisplaced fracture of fifth left metatarsal bone 03/24/2014  . Foot pain, left 03/22/2014  . Hyperkalemia 10/21/2013  . Thrombocytopenia ([place]) 08/17/2013  .  Infection of urinary tract 06/04/2013  . Unspecified gastritis and gastroduodenitis without mention of hemorrhage 04/15/2013  . Anemia in chronic kidney disease 01/05/2013  . Hypothyroidism 12/10/2012  . Fall at nursing home 10/22/2012  . Depression 10/22/2012  . Dementia 10/22/2012  . Essential hypertension, benign 10/22/2012    . Duodenal ulcer 09/13/2011  . CHF (congestive heart failure) ([place]) 09/13/2011  . CKD (chronic kidney disease) 09/13/2011  . Respiratory failure ([place]) 09/13/2011  . Type 2 diabetes mellitus with diabetic chronic kidney disease ([place]) 09/13/2011    Allergies  Allergen Reactions  . Enalapril Other (See Comments)    cough  . Metformin And Related Diarrhea  . Milk-Related Compounds Other (See Comments)    Doesn't remember but its a mild reaction    Medications: Patient's Medications  New Prescriptions   No medications on file  Previous Medications   ACETAMINOPHEN (TYLENOL) 325 MG TABLET    Take 650 mg by mouth every 4 (four) hours as needed (for pain).    DULOXETINE (CYMBALTA) 60 MG CAPSULE    Take 60 mg by mouth daily.   FAMOTIDINE (PEPCID) 20 MG TABLET       FLUTICASONE-SALMETEROL (ADVAIR HFA) 45-21 MCG/ACT INHALER    Inhale 2 puffs into the lungs every 12 (twelve) hours.    FOLIC ACID (FOLVITE) 1 MG TABLET    Take 1 mg by mouth daily.    FUROSEMIDE (LASIX) 40 MG TABLET    Take 40 mg by mouth daily.   GUAIFENESIN (ROBITUSSIN) 100 MG/5ML LIQUID    Take 200 mg by mouth. Give 39ml every 6 hours as needed for cough   INSULIN ASPART (NOVOLOG) 100 UNIT/ML INJECTION    Inject 5 Units into the skin 3 (three) times daily before meals. For CBG>150   INSULIN GLARGINE (LANTUS) 100 UNIT/ML INJECTION    Inject 54 Units into the skin at bedtime.   LEVOTHYROXINE (SYNTHROID, LEVOTHROID) 75 MCG TABLET    Take 75 mcg by mouth daily.    METOPROLOL TARTRATE (LOPRESSOR) 25 MG TABLET    Take 25 mg by mouth 2 (two) times daily.   SPIRONOLACTONE (ALDACTONE) 25 MG TABLET    Take 25 mg by mouth daily.   TIOTROPIUM (SPIRIVA) 18 MCG INHALATION CAPSULE    Place 18 mcg into inhaler and inhale daily. 2 puffs daily   VITAMIN B-12 (CYANOCOBALAMIN) 1000 MCG TABLET    Take 1,000 mcg by mouth daily.  Modified Medications   No medications on file  Discontinued Medications   No medications on file    Physical  Exam: Filed Vitals:   05/30/15 1539  BP: 152/67  Pulse: 78  Temp: 96.9 F (36.1 C)  TempSrc: Tympanic  Resp: 20   There is no weight on file to calculate BMI.  Physical Exam  Constitutional: He is oriented to person, place, and time. He appears well-developed and well-nourished. No distress.  HENT:  Head: Normocephalic and atraumatic.  Right Ear: External ear normal.  Left Ear: External ear normal.  Nose: Nose normal.  Mouth/Throat: Oropharynx is clear and moist. No oropharyngeal exudate.  Eyes: Conjunctivae and EOM are normal. Pupils are equal, round, and reactive to light.  Neck: Normal range of motion. Neck supple. No JVD present. No thyromegaly present.  Cardiovascular: Normal rate, regular rhythm and normal heart sounds.   No murmur heard. Pulmonary/Chest: Effort normal. No respiratory distress. He has no wheezes. He has rales in the right lower field and the left lower field. He exhibits no tenderness.  Abdominal: Soft. Bowel sounds are normal. He exhibits no distension and no mass. There is no tenderness. There is no rebound.  Musculoskeletal: Normal range of motion. He exhibits edema (trace in BLE). He exhibits no tenderness.  1+ edema BLE  Lymphadenopathy:    He has no cervical adenopathy.  Neurological: He is alert and oriented to person, place, and time. He has normal reflexes. No cranial nerve deficit. Coordination normal.  Skin: Skin is warm and dry. No rash noted. He is not diaphoretic. No erythema.  Psychiatric: Thought content normal. His mood appears anxious. His affect is angry and inappropriate. His affect is not blunt and not labile. His speech is not delayed, not tangential and not slurred. He is agitated and aggressive. He is not hyperactive, not slowed, not withdrawn, not actively hallucinating and not combative. Cognition and memory are impaired. He exhibits a depressed mood. He is communicative. He exhibits abnormal recent memory. He is attentive.    Labs  reviewed: Basic Metabolic Panel:  Recent Labs  07/28/14 1035 08/11/14 0926 09/22/14 0905 05/24/15  NA 135* 134* 135* 134*  K 5.3* 4.6 5.6* 4.6  CO2 25 25 25   --   GLUCOSE 185* 154* 210*  --   BUN 39.5* 41.3* 34.4* 47*  CREATININE 1.7* 1.7* 1.7* 1.5*  CALCIUM 8.9 8.6 9.0  --     Liver Function Tests:  Recent Labs  07/28/14 1035 08/11/14 0926 09/22/14 0905 05/24/15  AST 9 12 10  13*  ALT 8 9 11 16   ALKPHOS 87 100 109 79  BILITOT 0.42 0.37 0.39  --   PROT 6.4 6.8 6.2*  --   ALBUMIN 2.9* 3.3* 3.1*  --     CBC:  Recent Labs  03/24/15 0812 04/21/15 0806 05/19/15 0757  WBC 4.1 4.7 4.7  NEUTROABS 2.5 3.3 2.7  HGB 8.0* 8.1* 7.7*  HCT 23.6* 24.3* 23.3*  MCV 99.2* 98.8* 100.9*  PLT 61* 84* 63*    Lab Results  Component Value Date   TSH 3.43 05/24/2015   Lab Results  Component Value Date   HGBA1C 8.2* 05/24/2015   No results found for: CHOL, HDL, LDLCALC, LDLDIRECT, TRIG, CHOLHDL  Significant Diagnostic Results since last visit: none  Patient Care Team: Estill Dooms, MD as PCP - General (Internal Medicine) Koda Defrank X, NP as Nurse Practitioner (Nurse Practitioner)  Assessment/Plan Problem List Items Addressed This Visit    Anemia in chronic kidney disease - Primary (Chronic)    F/u oncology, blood transfusion prn, no active bleeding.       CHF (congestive heart failure) (HCC) (Chronic)    Compensated clinically, continue Furosemide 40mg  and Spironolactone 25mg  daily. Serum Na 134 05/24/15      CKD (chronic kidney disease) (Chronic)    Chronic, baseline creatinine 1.5-2.0      Dementia (Chronic)    Gradual decline, off Namenda R>B in setting of bone marrow dysplasia . Continue SNF for care needs      Depression    Mood is stable, continue Cymbalta 60mg  daily      Duodenal ulcer    Stable, no change in tx,  no c/o stomach pain, indigestion, or melena.        Essential hypertension, benign (Chronic)    Controlled, continue Metoprolol 25mg   bid. off Losartan due to CKD. Creatinine 1.5-2.0      Hyperkalemia (Chronic)    Normalized, last serum K 4.6 05/24/15      Hypothyroidism (Chronic)    Controlled, last TSH  3.433 05/24/15, continue Levothyroxine 50mcg daily.       MDS (myelodysplastic syndrome) (HCC) (Chronic)    Continue f/u oncology, prn blood transfusion.       Type 2 diabetes mellitus with diabetic chronic kidney disease (HCC) (Chronic)    Continue Lantus 54 qd Novolog 12u for CBG >450 instead of 5u with meals. 05/24/15 Hgb A1c 8.2          Family/ staff Communication: continue to observe the patient.   Labs/tests ordered: CMP, Hgb A1c, TSH done 05/24/15  [facility] Daniell Paradise NP Geriatrics [facility] [address], [place] [address] On Call:  [phone] & follow prompts after 5pm & weekends Office Phone:  [phone] Office Fax:  [phone]

## 2015-05-30 NOTE — Assessment & Plan Note (Signed)
Stable, no change in tx,  no c/o stomach pain, indigestion, or melena.

## 2015-05-30 NOTE — Assessment & Plan Note (Signed)
Controlled, continue Metoprolol 25mg  bid. off Losartan due to CKD. Creatinine 1.5-2.0

## 2015-05-31 ENCOUNTER — Ambulatory Visit (HOSPITAL_COMMUNITY)
Admission: RE | Admit: 2015-05-31 | Discharge: 2015-05-31 | Disposition: A | Discharge status: Home / Self Care | Payer: Medicare Other | Source: Ambulatory Visit | Attending: Hematology | Admitting: Hematology

## 2015-05-31 DIAGNOSIS — D469 Myelodysplastic syndrome, unspecified: Secondary | ICD-10-CM | POA: Insufficient documentation

## 2015-06-13 ENCOUNTER — Non-Acute Institutional Stay (SKILLED_NURSING_FACILITY): Payer: Medicare Other | Admitting: Nurse Practitioner

## 2015-06-13 ENCOUNTER — Encounter: Payer: Self-pay | Admitting: Nurse Practitioner

## 2015-06-13 DIAGNOSIS — N182 Chronic kidney disease, stage 2 (mild): Secondary | ICD-10-CM

## 2015-06-13 DIAGNOSIS — E039 Hypothyroidism, unspecified: Secondary | ICD-10-CM

## 2015-06-13 DIAGNOSIS — F0390 Unspecified dementia without behavioral disturbance: Secondary | ICD-10-CM | POA: Diagnosis not present

## 2015-06-13 DIAGNOSIS — I10 Essential (primary) hypertension: Secondary | ICD-10-CM | POA: Diagnosis not present

## 2015-06-13 DIAGNOSIS — F329 Major depressive disorder, single episode, unspecified: Secondary | ICD-10-CM | POA: Diagnosis not present

## 2015-06-13 DIAGNOSIS — D469 Myelodysplastic syndrome, unspecified: Secondary | ICD-10-CM

## 2015-06-13 DIAGNOSIS — J209 Acute bronchitis, unspecified: Secondary | ICD-10-CM

## 2015-06-13 DIAGNOSIS — K269 Duodenal ulcer, unspecified as acute or chronic, without hemorrhage or perforation: Secondary | ICD-10-CM

## 2015-06-13 DIAGNOSIS — I5040 Unspecified combined systolic (congestive) and diastolic (congestive) heart failure: Secondary | ICD-10-CM

## 2015-06-13 DIAGNOSIS — F32A Depression, unspecified: Secondary | ICD-10-CM

## 2015-06-13 DIAGNOSIS — E1122 Type 2 diabetes mellitus with diabetic chronic kidney disease: Secondary | ICD-10-CM

## 2015-06-13 NOTE — Assessment & Plan Note (Signed)
Continue f/u oncology, prn blood transfusion.  

## 2015-06-13 NOTE — Progress Notes (Signed)
Patient ID: [name] [name], male   DOB: June 30, 1920, 81 y.o.   MRN: [id number]  Location:  SNF [facility] Provider:  Marlana Latus NP  Code Status:  DNR Goals of care: Advanced Directive information    Chief Complaint  Patient presents with  . Medical Management of Chronic Issues  . Acute Visit    congestive cough     HPI: Patient is a 81 y.o. male seen in the SNF at [facility] today for evaluation of congestive cough, afebrile, Sat O2 90% RA, denied chest pain or SOB, unable to use Advair and Spiriva appropriately.  hx of dementia, PUD, anemia, T2DM with blood sugar controlled on Lantus , hypertension on Metoprolol, chronic edema is managed with Furosemide 40mg  and Spironolactone 25mg  depression on Cymbalta 60mg , and hypothyroidism on Levothyroxine 34mcg.   Review of Systems:  Review of Systems  Constitutional: Negative for fever and chills.  HENT: Positive for hearing loss. Negative for congestion, ear discharge and ear pain.   Eyes: Negative for pain, discharge and redness.  Respiratory: Positive for cough. Negative for shortness of breath and wheezing.   Cardiovascular: Positive for leg swelling. Negative for chest pain and palpitations.       Edema 1+ BLE  Gastrointestinal: Negative for nausea, vomiting, abdominal pain and constipation.  Genitourinary: Positive for frequency. Negative for dysuria and urgency.  Musculoskeletal: Negative for myalgias, back pain and neck pain.  Skin: Negative for rash.  Neurological: Negative for dizziness, tremors, seizures, weakness and headaches.  Endo/Heme/Allergies: Does not bruise/bleed easily.  Psychiatric/Behavioral: Positive for memory loss. Negative for suicidal ideas and hallucinations. The patient is nervous/anxious.     Past Medical History  Diagnosis Date  . Hypothyroidism   . Hypertension   . CHF (congestive heart failure) ([place])   . Diabetes mellitus   . Coronary artery disease   . Restless leg syndrome   . Hyperlipemia    . Renal disease     stage II  . Sleep apnea   . Cellulitis and abscess of leg   . Obesity   . Duodenal ulcer, perforated ([place])   . Diverticulosis   . Internal hemorrhoids   . CAD (coronary artery disease)   . Anemia   . Duodenal ulcer   . Allergy   . Blood transfusion   . Cataract   . Tuberculosis     as a child  . Contact dermatitis and other eczema, due to unspecified cause 10/06/2012  . Senile dementia with depressive features 05/26/2012  . Hyposmolality and/or hyponatremia 04/02/2012  . Diarrhea 03/26/2012  . Hemorrhage of rectum and anus 03/25/2012  . Pneumonia, organism unspecified 03/12/2012  . 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 Folate-deficiency anemia 01/07/2012  . Thrombocytopenia, unspecified ([place]) 09/26/2011  . Loss of weight 09/11/2011  . Dysphagia, unspecified(787.20) 06/28/2011  . Chronic kidney disease, stage III (moderate) 03/22/2011  . Cramp of limb 08/10/2010  . Chronic kidney disease, stage II (mild) 03/22/2011  . Personal history of fall 05/05/2009  . Unspecified vitamin D deficiency 02/24/2009  . Blood in stool 11/18/2008  . Dermatophytosis of the body 06/23/2007  . Obesity, unspecified 06/23/2007  . Unspecified tinnitus 06/23/2007  . Edema 06/23/2007  . Shortness of breath 06/23/2007  . Flatulence, eructation, and gas pain 06/23/2007  . Other general symptoms(780.99) 06/23/2007  . Memory loss 08/13/2003  . Screening PSA (prostate specific antigen) 07/27/2014  . Prostate cancer screening 07/27/2014  . Weight loss 07/27/2014    Patient Active  Problem List   Diagnosis Date Noted  . Acute bronchitis 06/13/2015  . Screening PSA (prostate specific antigen) 07/27/2014  . Prostate cancer screening 07/27/2014  . MDS (myelodysplastic syndrome) ([place]) 06/07/2014  . Closed nondisplaced fracture of fifth left metatarsal bone 03/24/2014  . Foot pain, left 03/22/2014  . Hyperkalemia 10/21/2013  . Thrombocytopenia ([place])  08/17/2013  . Infection of urinary tract 06/04/2013  . Unspecified gastritis and gastroduodenitis without mention of hemorrhage 04/15/2013  . Anemia in chronic kidney disease 01/05/2013  . Hypothyroidism 12/10/2012  . Fall at nursing home 10/22/2012  . Depression 10/22/2012  . Dementia 10/22/2012  . Essential hypertension, benign 10/22/2012  . Duodenal ulcer 09/13/2011  . CHF (congestive heart failure) ([place]) 09/13/2011  . CKD (chronic kidney disease) 09/13/2011  . Respiratory failure ([place]) 09/13/2011  . Type 2 diabetes mellitus with diabetic chronic kidney disease ([place]) 09/13/2011    Allergies  Allergen Reactions  . Enalapril Other (See Comments)    cough  . Metformin And Related Diarrhea  . Milk-Related Compounds Other (See Comments)    Doesn't remember but its a mild reaction    Medications: Patient's Medications  New Prescriptions   No medications on file  Previous Medications   ACETAMINOPHEN (TYLENOL) 325 MG TABLET    Take 650 mg by mouth every 4 (four) hours as needed (for pain).    DULOXETINE (CYMBALTA) 60 MG CAPSULE    Take 60 mg by mouth daily.   FAMOTIDINE (PEPCID) 20 MG TABLET       FLUTICASONE-SALMETEROL (ADVAIR HFA) 45-21 MCG/ACT INHALER    Inhale 2 puffs into the lungs every 12 (twelve) hours.    FOLIC ACID (FOLVITE) 1 MG TABLET    Take 1 mg by mouth daily.    FUROSEMIDE (LASIX) 40 MG TABLET    Take 40 mg by mouth daily.   GUAIFENESIN (ROBITUSSIN) 100 MG/5ML LIQUID    Take 200 mg by mouth. Give 98ml every 6 hours as needed for cough   INSULIN ASPART (NOVOLOG) 100 UNIT/ML INJECTION    Inject 5 Units into the skin 3 (three) times daily before meals. For CBG>150   INSULIN GLARGINE (LANTUS) 100 UNIT/ML INJECTION    Inject 54 Units into the skin at bedtime.   LEVOTHYROXINE (SYNTHROID, LEVOTHROID) 75 MCG TABLET    Take 75 mcg by mouth daily.    METOPROLOL TARTRATE (LOPRESSOR) 25 MG TABLET    Take 25 mg by mouth 2 (two) times daily.   SPIRONOLACTONE (ALDACTONE) 25 MG  TABLET    Take 25 mg by mouth daily.   TIOTROPIUM (SPIRIVA) 18 MCG INHALATION CAPSULE    Place 18 mcg into inhaler and inhale daily. 2 puffs daily   VITAMIN B-12 (CYANOCOBALAMIN) 1000 MCG TABLET    Take 1,000 mcg by mouth daily.  Modified Medications   No medications on file  Discontinued Medications   No medications on file    Physical Exam: Filed Vitals:   06/13/15 1134  BP: 117/60  Pulse: 70  Temp: 98.5 F (36.9 C)  TempSrc: Tympanic  Resp: 17   There is no weight on file to calculate BMI.  Physical Exam  Constitutional: He is oriented to person, place, and time. He appears well-developed and well-nourished. No distress.  HENT:  Head: Normocephalic and atraumatic.  Right Ear: External ear normal.  Left Ear: External ear normal.  Nose: Nose normal.  Mouth/Throat: Oropharynx is clear and moist. No oropharyngeal exudate.  Eyes: Conjunctivae and EOM are normal. Pupils are equal, round, and  reactive to light.  Neck: Normal range of motion. Neck supple. No JVD present. No thyromegaly present.  Cardiovascular: Normal rate, regular rhythm and normal heart sounds.   No murmur heard. Pulmonary/Chest: Effort normal. No respiratory distress. He has no wheezes. He has rales in the right lower field and the left lower field. He exhibits no tenderness.  Coarse rhonchi  Abdominal: Soft. Bowel sounds are normal. He exhibits no distension and no mass. There is no tenderness. There is no rebound.  Musculoskeletal: Normal range of motion. He exhibits edema (trace in BLE). He exhibits no tenderness.  1+ edema BLE  Lymphadenopathy:    He has no cervical adenopathy.  Neurological: He is alert and oriented to person, place, and time. He has normal reflexes. No cranial nerve deficit. Coordination normal.  Skin: Skin is warm and dry. No rash noted. He is not diaphoretic. No erythema.  Psychiatric: Thought content normal. His mood appears anxious. His affect is angry and inappropriate. His affect  is not blunt and not labile. His speech is not delayed, not tangential and not slurred. He is agitated and aggressive. He is not hyperactive, not slowed, not withdrawn, not actively hallucinating and not combative. Cognition and memory are impaired. He exhibits a depressed mood. He is communicative. He exhibits abnormal recent memory. He is attentive.    Labs reviewed: Basic Metabolic Panel:  Recent Labs  07/28/14 1035 08/11/14 0926 09/22/14 0905 05/24/15  NA 135* 134* 135* 134*  K 5.3* 4.6 5.6* 4.6  CO2 25 25 25   --   GLUCOSE 185* 154* 210*  --   BUN 39.5* 41.3* 34.4* 47*  CREATININE 1.7* 1.7* 1.7* 1.5*  CALCIUM 8.9 8.6 9.0  --     Liver Function Tests:  Recent Labs  07/28/14 1035 08/11/14 0926 09/22/14 0905 05/24/15  AST 9 12 10  13*  ALT 8 9 11 16   ALKPHOS 87 100 109 79  BILITOT 0.42 0.37 0.39  --   PROT 6.4 6.8 6.2*  --   ALBUMIN 2.9* 3.3* 3.1*  --     CBC:  Recent Labs  03/24/15 0812 04/21/15 0806 05/19/15 0757  WBC 4.1 4.7 4.7  NEUTROABS 2.5 3.3 2.7  HGB 8.0* 8.1* 7.7*  HCT 23.6* 24.3* 23.3*  MCV 99.2* 98.8* 100.9*  PLT 61* 84* 63*    Lab Results  Component Value Date   TSH 3.43 05/24/2015   Lab Results  Component Value Date   HGBA1C 8.2* 05/24/2015   No results found for: CHOL, HDL, LDLCALC, LDLDIRECT, TRIG, CHOLHDL  Significant Diagnostic Results since last visit: none  Patient Care Team: Estill Dooms, MD as PCP - General (Internal Medicine) Kesley Gaffey X, NP as Nurse Practitioner (Nurse Practitioner)  Assessment/Plan Problem List Items Addressed This Visit    CHF (congestive heart failure) ([place]) - Primary (Chronic)    Minimal BLE edema has no change, continue Furosemide 40mg , Spironolactone 25mg        CKD (chronic kidney disease) (Chronic)    05/24/15 Bun/creat 47/1.53      Type 2 diabetes mellitus with diabetic chronic kidney disease ([place]) (Chronic)    05/24/15 Hgb A1c 8.2 Lantus 54 qd Novolog 12u for CBG >450 instead of 5u with  meals.         Dementia (Chronic)    Gradual decline, off Namenda R>B in setting of bone marrow dysplasia . Continue SNF for care needs      Essential hypertension, benign (Chronic)    Controlled, continue Metoprolol 25mg  bid.  off Losartan due to CKD. Creatinine 1.5-2.0      Hypothyroidism (Chronic)    Controlled, last TSH 3.433 05/24/15, continue Levothyroxine 76mcg daily.       MDS (myelodysplastic syndrome) (HCC) (Chronic)    Continue f/u oncology, prn blood transfusion.       Duodenal ulcer    Stable, no change in tx,  no c/o stomach pain, indigestion, or melena.        Depression    Mood is stable, continue Cymbalta 60mg  daily      Acute bronchitis    DuoNeb tid, Levaquin 500mg  qd x7days, observe the patient.           Family/ staff Communication: continue to observe the patient.   Labs/tests ordered: none  ManXie Diona Peregoy NP Geriatrics [facility] [address], [place] [address] On Call:  [phone] & follow prompts after 5pm & weekends Office Phone:  [phone] Office Fax:  [phone]

## 2015-06-13 NOTE — Assessment & Plan Note (Signed)
Stable, no change in tx,  no c/o stomach pain, indigestion, or melena.   

## 2015-06-13 NOTE — Assessment & Plan Note (Signed)
Mood is stable, continue Cymbalta 60mg daily 

## 2015-06-13 NOTE — Assessment & Plan Note (Signed)
Controlled, continue Metoprolol 25mg bid. off Losartan due to CKD. Creatinine 1.5-2.0 

## 2015-06-13 NOTE — Assessment & Plan Note (Signed)
Minimal BLE edema has no change, continue Furosemide 40mg , Spironolactone 25mg 

## 2015-06-13 NOTE — Assessment & Plan Note (Signed)
05/24/15 Bun/creat 47/1.53

## 2015-06-13 NOTE — Assessment & Plan Note (Signed)
Controlled, last TSH 3.433 05/24/15, continue Levothyroxine 75mcg daily.  

## 2015-06-13 NOTE — Assessment & Plan Note (Signed)
Gradual decline, off Namenda R>B in setting of bone marrow dysplasia . Continue SNF for care needs 

## 2015-06-13 NOTE — Assessment & Plan Note (Signed)
05/24/15 Hgb A1c 8.2 Lantus 54 qd Novolog 12u for CBG >450 instead of 5u with meals.

## 2015-06-13 NOTE — Assessment & Plan Note (Signed)
DuoNeb tid, Levaquin 500mg  qd x7days, observe the patient.

## 2015-06-16 ENCOUNTER — Ambulatory Visit (HOSPITAL_BASED_OUTPATIENT_CLINIC_OR_DEPARTMENT_OTHER): Payer: Medicare Other

## 2015-06-16 ENCOUNTER — Other Ambulatory Visit (HOSPITAL_BASED_OUTPATIENT_CLINIC_OR_DEPARTMENT_OTHER): Payer: Medicare Other

## 2015-06-16 ENCOUNTER — Other Ambulatory Visit: Payer: Self-pay | Admitting: Hematology and Oncology

## 2015-06-16 ENCOUNTER — Encounter: Payer: Self-pay | Admitting: General Practice

## 2015-06-16 VITALS — BP 106/51 | HR 88 | Temp 97.2°F | Resp 18

## 2015-06-16 DIAGNOSIS — N189 Chronic kidney disease, unspecified: Secondary | ICD-10-CM | POA: Diagnosis present

## 2015-06-16 DIAGNOSIS — D469 Myelodysplastic syndrome, unspecified: Secondary | ICD-10-CM

## 2015-06-16 DIAGNOSIS — D631 Anemia in chronic kidney disease: Secondary | ICD-10-CM

## 2015-06-16 LAB — CBC WITH DIFFERENTIAL/PLATELET
BASO%: 0 % (ref 0.0–2.0)
Basophils Absolute: 0 10*3/uL (ref 0.0–0.1)
EOS ABS: 0.2 10*3/uL (ref 0.0–0.5)
EOS%: 4.5 % (ref 0.0–7.0)
HEMATOCRIT: 23 % — AB (ref 38.4–49.9)
HGB: 7.7 g/dL — ABNORMAL LOW (ref 13.0–17.1)
LYMPH%: 17.2 % (ref 14.0–49.0)
MCH: 35 pg — AB (ref 27.2–33.4)
MCHC: 33.5 g/dL (ref 32.0–36.0)
MCV: 104.5 fL — ABNORMAL HIGH (ref 79.3–98.0)
MONO#: 0.3 10*3/uL (ref 0.1–0.9)
MONO%: 7.1 % (ref 0.0–14.0)
NEUT%: 71.2 % (ref 39.0–75.0)
NEUTROS ABS: 3.2 10*3/uL (ref 1.5–6.5)
PLATELETS: 66 10*3/uL — AB (ref 140–400)
RBC: 2.2 10*6/uL — ABNORMAL LOW (ref 4.20–5.82)
RDW: 23.6 % — ABNORMAL HIGH (ref 11.0–14.6)
WBC: 4.5 10*3/uL (ref 4.0–10.3)
lymph#: 0.8 10*3/uL — ABNORMAL LOW (ref 0.9–3.3)
nRBC: 0 % (ref 0–0)

## 2015-06-16 LAB — PREPARE RBC (CROSSMATCH)

## 2015-06-16 LAB — HOLD TUBE, BLOOD BANK

## 2015-06-16 MED ORDER — ACETAMINOPHEN 325 MG PO TABS
ORAL_TABLET | ORAL | Status: AC
  Filled 2015-06-16: qty 2

## 2015-06-16 MED ORDER — DIPHENHYDRAMINE HCL 25 MG PO CAPS
ORAL_CAPSULE | ORAL | Status: AC
  Filled 2015-06-16: qty 1

## 2015-06-16 MED ORDER — DIPHENHYDRAMINE HCL 25 MG PO CAPS
25.0000 mg | ORAL_CAPSULE | Freq: Once | ORAL | Status: AC
  Administered 2015-06-16: 25 mg via ORAL

## 2015-06-16 MED ORDER — ACETAMINOPHEN 325 MG PO TABS
650.0000 mg | ORAL_TABLET | Freq: Once | ORAL | Status: AC
  Administered 2015-06-16: 650 mg via ORAL

## 2015-06-16 MED ORDER — SODIUM CHLORIDE 0.9 % IV SOLN
250.0000 mL | Freq: Once | INTRAVENOUS | Status: AC
  Administered 2015-06-16: 250 mL via INTRAVENOUS

## 2015-06-16 NOTE — Patient Instructions (Signed)

## 2015-06-16 NOTE — Progress Notes (Signed)
Spiritual Care Note  Provided 1:1 emotional support to pt's wife [name] and then met with them together.  Doug welcomed me with enthusiasm and bright spirits today.  Dolores notes that the regular schedule of morning transfusion appointments every four weeks has been very helpful for their routine/transportation, and she appeared less stressed than she has in previous encounters.  She verbalized appreciation for Dr Alvy Bimler, [place]/RN, and their proactive approach to treatment.  Following for support, but please also page as needs arise/circumstances change.  Thank you.  [place], [place], [facility] Pager [phone] Voicemail  [phone]

## 2015-06-17 LAB — TYPE AND SCREEN
ABO/RH(D): O POS
Antibody Screen: NEGATIVE
UNIT DIVISION: 0
Unit division: 0

## 2015-06-30 ENCOUNTER — Ambulatory Visit (HOSPITAL_COMMUNITY)
Admission: RE | Admit: 2015-06-30 | Discharge: 2015-06-30 | Disposition: A | Discharge status: Home / Self Care | Payer: Medicare Other | Source: Ambulatory Visit | Attending: Hematology | Admitting: Hematology

## 2015-06-30 DIAGNOSIS — D469 Myelodysplastic syndrome, unspecified: Secondary | ICD-10-CM | POA: Insufficient documentation

## 2015-07-07 ENCOUNTER — Non-Acute Institutional Stay (SKILLED_NURSING_FACILITY): Payer: Medicare Other | Admitting: Nurse Practitioner

## 2015-07-07 ENCOUNTER — Encounter: Payer: Self-pay | Admitting: Nurse Practitioner

## 2015-07-07 DIAGNOSIS — N189 Chronic kidney disease, unspecified: Secondary | ICD-10-CM | POA: Diagnosis not present

## 2015-07-07 DIAGNOSIS — N182 Chronic kidney disease, stage 2 (mild): Secondary | ICD-10-CM | POA: Diagnosis not present

## 2015-07-07 DIAGNOSIS — I5020 Unspecified systolic (congestive) heart failure: Secondary | ICD-10-CM | POA: Diagnosis not present

## 2015-07-07 DIAGNOSIS — E1122 Type 2 diabetes mellitus with diabetic chronic kidney disease: Secondary | ICD-10-CM

## 2015-07-07 DIAGNOSIS — F329 Major depressive disorder, single episode, unspecified: Secondary | ICD-10-CM | POA: Diagnosis not present

## 2015-07-07 DIAGNOSIS — D631 Anemia in chronic kidney disease: Secondary | ICD-10-CM

## 2015-07-07 DIAGNOSIS — D469 Myelodysplastic syndrome, unspecified: Secondary | ICD-10-CM

## 2015-07-07 DIAGNOSIS — K269 Duodenal ulcer, unspecified as acute or chronic, without hemorrhage or perforation: Secondary | ICD-10-CM

## 2015-07-07 DIAGNOSIS — E039 Hypothyroidism, unspecified: Secondary | ICD-10-CM

## 2015-07-07 DIAGNOSIS — F32A Depression, unspecified: Secondary | ICD-10-CM

## 2015-07-07 DIAGNOSIS — F0390 Unspecified dementia without behavioral disturbance: Secondary | ICD-10-CM | POA: Diagnosis not present

## 2015-07-07 DIAGNOSIS — I10 Essential (primary) hypertension: Secondary | ICD-10-CM | POA: Diagnosis not present

## 2015-07-07 NOTE — Assessment & Plan Note (Signed)
F/u oncology, blood transfusion prn, no active bleeding.  

## 2015-07-07 NOTE — Assessment & Plan Note (Signed)
Continue f/u oncology, prn blood transfusion.  

## 2015-07-07 NOTE — Assessment & Plan Note (Signed)
05/24/15 Bun/creat 47/1.53

## 2015-07-07 NOTE — Assessment & Plan Note (Signed)
Stable, no change in tx,  no c/o stomach pain, indigestion, or melena.   

## 2015-07-07 NOTE — Assessment & Plan Note (Signed)
Mood is stable, continue Cymbalta 60mg daily 

## 2015-07-07 NOTE — Progress Notes (Signed)
Patient ID: [name] [name], male   DOB: September 25, 1919, 81 y.o.   MRN: [id number]  Location:  SNF [facility] Provider:  Marlana Latus NP  Code Status:  DNR Goals of care: Advanced Directive information    Chief Complaint  Patient presents with  . Medical Management of Chronic Issues     HPI: Patient is a 81 y.o. male seen in the SNF at [facility] today for evaluation of chronic cough, afebrile, fully treated with ABT. Denied chest pain, no noted O2 desaturation.  hx of dementia, PUD, anemia, T2DM with blood sugar controlled on Lantus , hypertension on Metoprolol, chronic edema is managed with Furosemide 40mg  and Spironolactone 25mg  depression on Cymbalta 60mg , and hypothyroidism on Levothyroxine 15mcg.   Review of Systems:  Review of Systems  Constitutional: Negative for fever and chills.  HENT: Positive for hearing loss. Negative for congestion, ear discharge and ear pain.   Eyes: Negative for pain, discharge and redness.  Respiratory: Positive for cough. Negative for shortness of breath and wheezing.   Cardiovascular: Positive for leg swelling. Negative for chest pain and palpitations.       Edema 1+ BLE  Gastrointestinal: Negative for nausea, vomiting, abdominal pain and constipation.  Genitourinary: Positive for frequency. Negative for dysuria and urgency.  Musculoskeletal: Negative for myalgias, back pain and neck pain.  Skin: Negative for rash.  Neurological: Negative for dizziness, tremors, seizures, weakness and headaches.  Endo/Heme/Allergies: Does not bruise/bleed easily.  Psychiatric/Behavioral: Positive for memory loss. Negative for suicidal ideas and hallucinations. The patient is nervous/anxious.     Past Medical History  Diagnosis Date  . Hypothyroidism   . Hypertension   . CHF (congestive heart failure) ([place])   . Diabetes mellitus   . Coronary artery disease   . Restless leg syndrome   . Hyperlipemia   . Renal disease     stage II  . Sleep apnea   .  Cellulitis and abscess of leg   . Obesity   . Duodenal ulcer, perforated ([place])   . Diverticulosis   . Internal hemorrhoids   . CAD (coronary artery disease)   . Anemia   . Duodenal ulcer   . Allergy   . Blood transfusion   . Cataract   . Tuberculosis     as a child  . Contact dermatitis and other eczema, due to unspecified cause 10/06/2012  . Senile dementia with depressive features 05/26/2012  . Hyposmolality and/or hyponatremia 04/02/2012  . Diarrhea 03/26/2012  . Hemorrhage of rectum and anus 03/25/2012  . Pneumonia, organism unspecified 03/12/2012  . 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 Folate-deficiency anemia 01/07/2012  . Thrombocytopenia, unspecified ([place]) 09/26/2011  . Loss of weight 09/11/2011  . Dysphagia, unspecified(787.20) 06/28/2011  . Chronic kidney disease, stage III (moderate) 03/22/2011  . Cramp of limb 08/10/2010  . Chronic kidney disease, stage II (mild) 03/22/2011  . Personal history of fall 05/05/2009  . Unspecified vitamin D deficiency 02/24/2009  . Blood in stool 11/18/2008  . Dermatophytosis of the body 06/23/2007  . Obesity, unspecified 06/23/2007  . Unspecified tinnitus 06/23/2007  . Edema 06/23/2007  . Shortness of breath 06/23/2007  . Flatulence, eructation, and gas pain 06/23/2007  . Other general symptoms(780.99) 06/23/2007  . Memory loss 08/13/2003  . Screening PSA (prostate specific antigen) 07/27/2014  . Prostate cancer screening 07/27/2014  . Weight loss 07/27/2014    Patient Active Problem List   Diagnosis Date Noted  . Acute bronchitis 06/13/2015  .  Screening PSA (prostate specific antigen) 07/27/2014  . Prostate cancer screening 07/27/2014  . MDS (myelodysplastic syndrome) ([place]) 06/07/2014  . Closed nondisplaced fracture of fifth left metatarsal bone 03/24/2014  . Foot pain, left 03/22/2014  . Hyperkalemia 10/21/2013  . Thrombocytopenia ([place]) 08/17/2013  . Infection of urinary tract 06/04/2013  .  Unspecified gastritis and gastroduodenitis without mention of hemorrhage 04/15/2013  . Anemia in chronic kidney disease 01/05/2013  . Hypothyroidism 12/10/2012  . Fall at nursing home 10/22/2012  . Depression 10/22/2012  . Dementia 10/22/2012  . Essential hypertension, benign 10/22/2012  . Duodenal ulcer 09/13/2011  . CHF (congestive heart failure) ([place]) 09/13/2011  . CKD (chronic kidney disease) 09/13/2011  . Respiratory failure ([place]) 09/13/2011  . Type 2 diabetes mellitus with diabetic chronic kidney disease ([place]) 09/13/2011    Allergies  Allergen Reactions  . Enalapril Other (See Comments)    cough  . Metformin And Related Diarrhea  . Milk-Related Compounds Other (See Comments)    Doesn't remember but its a mild reaction    Medications: Patient's Medications  New Prescriptions   No medications on file  Previous Medications   ACETAMINOPHEN (TYLENOL) 325 MG TABLET    Take 650 mg by mouth every 4 (four) hours as needed (for pain).    DULOXETINE (CYMBALTA) 60 MG CAPSULE    Take 60 mg by mouth daily.   FAMOTIDINE (PEPCID) 20 MG TABLET       FLUTICASONE-SALMETEROL (ADVAIR HFA) 45-21 MCG/ACT INHALER    Inhale 2 puffs into the lungs every 12 (twelve) hours.    FOLIC ACID (FOLVITE) 1 MG TABLET    Take 1 mg by mouth daily.    FUROSEMIDE (LASIX) 40 MG TABLET    Take 40 mg by mouth daily.   GUAIFENESIN (ROBITUSSIN) 100 MG/5ML LIQUID    Take 200 mg by mouth. Give 49ml every 6 hours as needed for cough   INSULIN ASPART (NOVOLOG) 100 UNIT/ML INJECTION    Inject 5 Units into the skin 3 (three) times daily before meals. For CBG>150   INSULIN GLARGINE (LANTUS) 100 UNIT/ML INJECTION    Inject 54 Units into the skin at bedtime.   LEVOTHYROXINE (SYNTHROID, LEVOTHROID) 75 MCG TABLET    Take 75 mcg by mouth daily.    METOPROLOL TARTRATE (LOPRESSOR) 25 MG TABLET    Take 25 mg by mouth 2 (two) times daily.   SPIRONOLACTONE (ALDACTONE) 25 MG TABLET    Take 25 mg by mouth daily.   TIOTROPIUM  (SPIRIVA) 18 MCG INHALATION CAPSULE    Place 18 mcg into inhaler and inhale daily. 2 puffs daily   VITAMIN B-12 (CYANOCOBALAMIN) 1000 MCG TABLET    Take 1,000 mcg by mouth daily.  Modified Medications   No medications on file  Discontinued Medications   No medications on file    Physical Exam: Filed Vitals:   07/07/15 1119  BP: 120/70  Pulse: 78  Temp: 98.9 F (37.2 C)  TempSrc: Tympanic  Resp: 18   There is no weight on file to calculate BMI.  Physical Exam  Constitutional: He is oriented to person, place, and time. He appears well-developed and well-nourished. No distress.  HENT:  Head: Normocephalic and atraumatic.  Right Ear: External ear normal.  Left Ear: External ear normal.  Nose: Nose normal.  Mouth/Throat: Oropharynx is clear and moist. No oropharyngeal exudate.  Eyes: Conjunctivae and EOM are normal. Pupils are equal, round, and reactive to light.  Neck: Normal range of motion. Neck supple. No JVD present.  No thyromegaly present.  Cardiovascular: Normal rate, regular rhythm and normal heart sounds.   No murmur heard. Pulmonary/Chest: Effort normal. No respiratory distress. He has no wheezes. He has rales in the right lower field and the left lower field. He exhibits no tenderness.  Abdominal: Soft. Bowel sounds are normal. He exhibits no distension and no mass. There is no tenderness. There is no rebound.  Musculoskeletal: Normal range of motion. He exhibits edema (trace in BLE). He exhibits no tenderness.  1+ edema BLE  Lymphadenopathy:    He has no cervical adenopathy.  Neurological: He is alert and oriented to person, place, and time. He has normal reflexes. No cranial nerve deficit. Coordination normal.  Skin: Skin is warm and dry. No rash noted. He is not diaphoretic. No erythema.  Psychiatric: Thought content normal. His mood appears anxious. His affect is angry and inappropriate. His affect is not blunt and not labile. His speech is not delayed, not  tangential and not slurred. He is agitated and aggressive. He is not hyperactive, not slowed, not withdrawn, not actively hallucinating and not combative. Cognition and memory are impaired. He exhibits a depressed mood. He is communicative. He exhibits abnormal recent memory. He is attentive.    Labs reviewed: Basic Metabolic Panel:  Recent Labs  07/28/14 1035 08/11/14 0926 09/22/14 0905 05/24/15  NA 135* 134* 135* 134*  K 5.3* 4.6 5.6* 4.6  CO2 25 25 25   --   GLUCOSE 185* 154* 210*  --   BUN 39.5* 41.3* 34.4* 47*  CREATININE 1.7* 1.7* 1.7* 1.5*  CALCIUM 8.9 8.6 9.0  --     Liver Function Tests:  Recent Labs  07/28/14 1035 08/11/14 0926 09/22/14 0905 05/24/15  AST 9 12 10  13*  ALT 8 9 11 16   ALKPHOS 87 100 109 79  BILITOT 0.42 0.37 0.39  --   PROT 6.4 6.8 6.2*  --   ALBUMIN 2.9* 3.3* 3.1*  --     CBC:  Recent Labs  04/21/15 0806 05/19/15 0757 06/16/15 0746  WBC 4.7 4.7 4.5  NEUTROABS 3.3 2.7 3.2  HGB 8.1* 7.7* 7.7*  HCT 24.3* 23.3* 23.0*  MCV 98.8* 100.9* 104.5*  PLT 84* 63* 66*    Lab Results  Component Value Date   TSH 3.43 05/24/2015   Lab Results  Component Value Date   HGBA1C 8.2* 05/24/2015   No results found for: CHOL, HDL, LDLCALC, LDLDIRECT, TRIG, CHOLHDL  Significant Diagnostic Results since last visit: none  Patient Care Team: Estill Dooms, MD as PCP - General (Internal Medicine) Chrystle Murillo X, NP as Nurse Practitioner (Nurse Practitioner)  Assessment/Plan Problem List Items Addressed This Visit    Anemia in chronic kidney disease (Chronic)    F/u oncology, blood transfusion prn, no active bleeding.       CHF (congestive heart failure) (HCC) (Chronic)    Minimal BLE edema has no change, continue Furosemide 40mg , Spironolactone 25mg        CKD (chronic kidney disease) (Chronic)    05/24/15 Bun/creat 47/1.53      Dementia - Primary (Chronic)    Gradual decline, off Namenda R>B in setting of bone marrow dysplasia . Continue SNF  for care needs      Depression    Mood is stable, continue Cymbalta 60mg  daily      Duodenal ulcer    Stable, no change in tx,  no c/o stomach pain, indigestion, or melena.       Essential hypertension, benign (Chronic)  Controlled, continue Metoprolol 25mg  bid. off Losartan due to CKD. Creatinine 1.5-2.0      Hypothyroidism (Chronic)    Controlled, last TSH 3.433 05/24/15, continue Levothyroxine 37mcg daily.       MDS (myelodysplastic syndrome) (HCC) (Chronic)    Continue f/u oncology, prn blood transfusion.       Type 2 diabetes mellitus with diabetic chronic kidney disease ([place]) (Chronic)    05/24/15 Hgb A1c 8.2 Lantus 54 qd Novolog 12u for CBG >450 instead of 5u with meals.           Family/ staff Communication: continue to observe the patient.   Labs/tests ordered: none  ManXie Analiya Porco NP [facility] [address], [place] [address] On Call:  [phone] & follow prompts after 5pm & weekends Office Phone:  [phone] Office Fax:  [phone]

## 2015-07-07 NOTE — Assessment & Plan Note (Signed)
05/24/15 Hgb A1c 8.2 Lantus 54 qd Novolog 12u for CBG >450 instead of 5u with meals.

## 2015-07-07 NOTE — Assessment & Plan Note (Signed)
DuoNeb tid, completed Levaquin 500mg  qd x7days, persisted chronic cough, denied chest pain,  Will try Prednisone 5mg  daily.

## 2015-07-07 NOTE — Assessment & Plan Note (Signed)
Minimal BLE edema has no change, continue Furosemide 40mg , Spironolactone 25mg 

## 2015-07-07 NOTE — Assessment & Plan Note (Signed)
Controlled, continue Metoprolol 25mg bid. off Losartan due to CKD. Creatinine 1.5-2.0 

## 2015-07-07 NOTE — Assessment & Plan Note (Signed)
Controlled, last TSH 3.433 05/24/15, continue Levothyroxine 75mcg daily.  

## 2015-07-07 NOTE — Assessment & Plan Note (Signed)
Gradual decline, off Namenda R>B in setting of bone marrow dysplasia . Continue SNF for care needs 

## 2015-07-14 ENCOUNTER — Ambulatory Visit (HOSPITAL_BASED_OUTPATIENT_CLINIC_OR_DEPARTMENT_OTHER): Payer: Medicare Other

## 2015-07-14 ENCOUNTER — Other Ambulatory Visit (HOSPITAL_BASED_OUTPATIENT_CLINIC_OR_DEPARTMENT_OTHER): Payer: Medicare Other

## 2015-07-14 ENCOUNTER — Other Ambulatory Visit: Payer: Self-pay | Admitting: *Deleted

## 2015-07-14 ENCOUNTER — Other Ambulatory Visit: Payer: Self-pay | Admitting: Hematology and Oncology

## 2015-07-14 VITALS — BP 130/60 | HR 82 | Temp 97.7°F | Resp 18

## 2015-07-14 DIAGNOSIS — D469 Myelodysplastic syndrome, unspecified: Secondary | ICD-10-CM

## 2015-07-14 DIAGNOSIS — N189 Chronic kidney disease, unspecified: Secondary | ICD-10-CM | POA: Diagnosis present

## 2015-07-14 DIAGNOSIS — D631 Anemia in chronic kidney disease: Secondary | ICD-10-CM | POA: Diagnosis not present

## 2015-07-14 LAB — CBC WITH DIFFERENTIAL/PLATELET
BASO%: 0.4 % (ref 0.0–2.0)
Basophils Absolute: 0 10*3/uL (ref 0.0–0.1)
EOS%: 2.8 % (ref 0.0–7.0)
Eosinophils Absolute: 0.2 10*3/uL (ref 0.0–0.5)
HCT: 23.2 % — ABNORMAL LOW (ref 38.4–49.9)
HGB: 7.7 g/dL — ABNORMAL LOW (ref 13.0–17.1)
LYMPH#: 0.8 10*3/uL — AB (ref 0.9–3.3)
LYMPH%: 13.5 % — AB (ref 14.0–49.0)
MCH: 34.8 pg — AB (ref 27.2–33.4)
MCHC: 33.2 g/dL (ref 32.0–36.0)
MCV: 105 fL — ABNORMAL HIGH (ref 79.3–98.0)
MONO#: 0.4 10*3/uL (ref 0.1–0.9)
MONO%: 6.7 % (ref 0.0–14.0)
NEUT%: 76.6 % — AB (ref 39.0–75.0)
NEUTROS ABS: 4.3 10*3/uL (ref 1.5–6.5)
Platelets: 78 10*3/uL — ABNORMAL LOW (ref 140–400)
RBC: 2.21 10*6/uL — ABNORMAL LOW (ref 4.20–5.82)
RDW: 22.9 % — ABNORMAL HIGH (ref 11.0–14.6)
WBC: 5.6 10*3/uL (ref 4.0–10.3)
nRBC: 0 % (ref 0–0)

## 2015-07-14 LAB — PREPARE RBC (CROSSMATCH)

## 2015-07-14 LAB — HOLD TUBE, BLOOD BANK

## 2015-07-14 MED ORDER — SODIUM CHLORIDE 0.9 % IV SOLN
250.0000 mL | Freq: Once | INTRAVENOUS | Status: AC
  Administered 2015-07-14: 250 mL via INTRAVENOUS

## 2015-07-14 MED ORDER — ACETAMINOPHEN 325 MG PO TABS
ORAL_TABLET | ORAL | Status: AC
  Filled 2015-07-14: qty 2

## 2015-07-14 MED ORDER — ACETAMINOPHEN 325 MG PO TABS
650.0000 mg | ORAL_TABLET | Freq: Once | ORAL | Status: AC
  Administered 2015-07-14: 650 mg via ORAL

## 2015-07-14 MED ORDER — DIPHENHYDRAMINE HCL 25 MG PO CAPS
ORAL_CAPSULE | ORAL | Status: AC
  Filled 2015-07-14: qty 1

## 2015-07-14 MED ORDER — DIPHENHYDRAMINE HCL 25 MG PO CAPS
25.0000 mg | ORAL_CAPSULE | Freq: Once | ORAL | Status: AC
  Administered 2015-07-14: 25 mg via ORAL

## 2015-07-14 NOTE — Patient Instructions (Signed)

## 2015-07-15 LAB — TYPE AND SCREEN
ABO/RH(D): O POS
ANTIBODY SCREEN: NEGATIVE
UNIT DIVISION: 0
UNIT DIVISION: 0

## 2015-08-02 ENCOUNTER — Ambulatory Visit (HOSPITAL_COMMUNITY)
Admission: RE | Admit: 2015-08-02 | Discharge: 2015-08-02 | Disposition: A | Discharge status: Home / Self Care | Payer: Medicare Other | Source: Ambulatory Visit | Attending: Hematology | Admitting: Hematology

## 2015-08-02 DIAGNOSIS — D469 Myelodysplastic syndrome, unspecified: Secondary | ICD-10-CM | POA: Insufficient documentation

## 2015-08-08 ENCOUNTER — Encounter: Payer: Self-pay | Admitting: Nurse Practitioner

## 2015-08-08 ENCOUNTER — Non-Acute Institutional Stay (SKILLED_NURSING_FACILITY): Payer: Medicare Other | Admitting: Nurse Practitioner

## 2015-08-08 DIAGNOSIS — K269 Duodenal ulcer, unspecified as acute or chronic, without hemorrhage or perforation: Secondary | ICD-10-CM

## 2015-08-08 DIAGNOSIS — J209 Acute bronchitis, unspecified: Secondary | ICD-10-CM

## 2015-08-08 DIAGNOSIS — D469 Myelodysplastic syndrome, unspecified: Secondary | ICD-10-CM | POA: Diagnosis not present

## 2015-08-08 DIAGNOSIS — N189 Chronic kidney disease, unspecified: Secondary | ICD-10-CM

## 2015-08-08 DIAGNOSIS — E039 Hypothyroidism, unspecified: Secondary | ICD-10-CM

## 2015-08-08 DIAGNOSIS — F0390 Unspecified dementia without behavioral disturbance: Secondary | ICD-10-CM

## 2015-08-08 DIAGNOSIS — F329 Major depressive disorder, single episode, unspecified: Secondary | ICD-10-CM | POA: Diagnosis not present

## 2015-08-08 DIAGNOSIS — I5020 Unspecified systolic (congestive) heart failure: Secondary | ICD-10-CM | POA: Diagnosis not present

## 2015-08-08 DIAGNOSIS — N182 Chronic kidney disease, stage 2 (mild): Secondary | ICD-10-CM

## 2015-08-08 DIAGNOSIS — E1122 Type 2 diabetes mellitus with diabetic chronic kidney disease: Secondary | ICD-10-CM | POA: Diagnosis not present

## 2015-08-08 DIAGNOSIS — I10 Essential (primary) hypertension: Secondary | ICD-10-CM | POA: Diagnosis not present

## 2015-08-08 DIAGNOSIS — D631 Anemia in chronic kidney disease: Secondary | ICD-10-CM | POA: Diagnosis not present

## 2015-08-08 DIAGNOSIS — F32A Depression, unspecified: Secondary | ICD-10-CM

## 2015-08-08 NOTE — Assessment & Plan Note (Signed)
Controlled, last TSH 3.433 05/24/15, continue Levothyroxine 75mcg daily.  

## 2015-08-08 NOTE — Assessment & Plan Note (Signed)
05/24/15 Hgb A1c 8.2 Continue Lantus 54 qd Novolog 12u for CBG >450 instead of 5u with meals.

## 2015-08-08 NOTE — Assessment & Plan Note (Signed)
Gradual decline, off Namenda R>B in setting of bone marrow dysplasia . Continue SNF for care needs 

## 2015-08-08 NOTE — Assessment & Plan Note (Signed)
Mood is stable, continue Cymbalta 60mg daily 

## 2015-08-08 NOTE — Assessment & Plan Note (Signed)
Dc DuoNeb tid, fully treated with Levaquin 500mg  qd x7days, improved chronic cough, denied chest pain, continue Prednisone 5mg  daily

## 2015-08-08 NOTE — Assessment & Plan Note (Signed)
Stable, no change in tx,  no c/o stomach pain, indigestion, or melena.   

## 2015-08-08 NOTE — Assessment & Plan Note (Signed)
Minimal BLE edema has no change, continue Furosemide 40mg , Spironolactone 25mg 

## 2015-08-08 NOTE — Progress Notes (Signed)
Patient ID: [name] [name], male   DOB: Apr 11, 1920, 82 y.o.   MRN: [id number]  Location:  SNF [facility] Provider:  Marlana Latus NP  Code Status:  DNR Goals of care: Advanced Directive information    Chief Complaint  Patient presents with  . Medical Management of Chronic Issues     HPI: Patient is a 82 y.o. male seen in the SNF at [facility] today for evaluation of chronic cough, afebrile, fully treated with ABT. Denied chest pain, no noted O2 desaturation. No longer DuoNeb needed.  hx of dementia, PUD, anemia, T2DM with blood sugar controlled on Lantus , hypertension on Metoprolol, chronic edema is managed with Furosemide 40mg  and Spironolactone 25mg  depression on Cymbalta 60mg , and hypothyroidism on Levothyroxine 73mcg.   Review of Systems  Constitutional: Negative for fever and chills.  HENT: Positive for hearing loss. Negative for congestion, ear discharge and ear pain.   Eyes: Negative for pain, discharge and redness.  Respiratory: Positive for cough. Negative for shortness of breath and wheezing.   Cardiovascular: Positive for leg swelling. Negative for chest pain and palpitations.       Edema 1+ BLE  Gastrointestinal: Negative for nausea, vomiting, abdominal pain and constipation.  Genitourinary: Positive for frequency. Negative for dysuria and urgency.  Musculoskeletal: Negative for myalgias, back pain and neck pain.  Skin: Negative for rash.  Neurological: Negative for dizziness, tremors, seizures, weakness and headaches.  Endo/Heme/Allergies: Does not bruise/bleed easily.  Psychiatric/Behavioral: Positive for memory loss. Negative for suicidal ideas and hallucinations. The patient is nervous/anxious.     Past Medical History  Diagnosis Date  . Hypothyroidism   . Hypertension   . CHF (congestive heart failure) ([place])   . Diabetes mellitus   . Coronary artery disease   . Restless leg syndrome   . Hyperlipemia   . Renal disease     stage II  . Sleep apnea   .  Cellulitis and abscess of leg   . Obesity   . Duodenal ulcer, perforated ([place])   . Diverticulosis   . Internal hemorrhoids   . CAD (coronary artery disease)   . Anemia   . Duodenal ulcer   . Allergy   . Blood transfusion   . Cataract   . Tuberculosis     as a child  . Contact dermatitis and other eczema, due to unspecified cause 10/06/2012  . Senile dementia with depressive features 05/26/2012  . Hyposmolality and/or hyponatremia 04/02/2012  . Diarrhea 03/26/2012  . Hemorrhage of rectum and anus 03/25/2012  . Pneumonia, organism unspecified 03/12/2012  . 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 Folate-deficiency anemia 01/07/2012  . Thrombocytopenia, unspecified ([place]) 09/26/2011  . Loss of weight 09/11/2011  . Dysphagia, unspecified(787.20) 06/28/2011  . Chronic kidney disease, stage III (moderate) 03/22/2011  . Cramp of limb 08/10/2010  . Chronic kidney disease, stage II (mild) 03/22/2011  . Personal history of fall 05/05/2009  . Unspecified vitamin D deficiency 02/24/2009  . Blood in stool 11/18/2008  . Dermatophytosis of the body 06/23/2007  . Obesity, unspecified 06/23/2007  . Unspecified tinnitus 06/23/2007  . Edema 06/23/2007  . Shortness of breath 06/23/2007  . Flatulence, eructation, and gas pain 06/23/2007  . Other general symptoms(780.99) 06/23/2007  . Memory loss 08/13/2003  . Screening PSA (prostate specific antigen) 07/27/2014  . Prostate cancer screening 07/27/2014  . Weight loss 07/27/2014    Patient Active Problem List   Diagnosis Date Noted  . Acute bronchitis 06/13/2015  .  Screening PSA (prostate specific antigen) 07/27/2014  . Prostate cancer screening 07/27/2014  . MDS (myelodysplastic syndrome) ([place]) 06/07/2014  . Closed nondisplaced fracture of fifth left metatarsal bone 03/24/2014  . Foot pain, left 03/22/2014  . Hyperkalemia 10/21/2013  . Thrombocytopenia ([place]) 08/17/2013  . Infection of urinary tract 06/04/2013  .  Unspecified gastritis and gastroduodenitis without mention of hemorrhage 04/15/2013  . Anemia in chronic kidney disease 01/05/2013  . Hypothyroidism 12/10/2012  . Fall at nursing home 10/22/2012  . Depression 10/22/2012  . Dementia 10/22/2012  . Essential hypertension, benign 10/22/2012  . Duodenal ulcer 09/13/2011  . CHF (congestive heart failure) ([place]) 09/13/2011  . CKD (chronic kidney disease) 09/13/2011  . Respiratory failure ([place]) 09/13/2011  . Type 2 diabetes mellitus with diabetic chronic kidney disease ([place]) 09/13/2011    Allergies  Allergen Reactions  . Enalapril Other (See Comments)    cough  . Metformin And Related Diarrhea  . Milk-Related Compounds Other (See Comments)    Doesn't remember but its a mild reaction    Medications: Patient's Medications  New Prescriptions   No medications on file  Previous Medications   ACETAMINOPHEN (TYLENOL) 325 MG TABLET    Take 650 mg by mouth every 4 (four) hours as needed (for pain).    DULOXETINE (CYMBALTA) 60 MG CAPSULE    Take 60 mg by mouth daily.   FAMOTIDINE (PEPCID) 20 MG TABLET       FLUTICASONE-SALMETEROL (ADVAIR HFA) 45-21 MCG/ACT INHALER    Inhale 2 puffs into the lungs every 12 (twelve) hours.    FOLIC ACID (FOLVITE) 1 MG TABLET    Take 1 mg by mouth daily.    FUROSEMIDE (LASIX) 40 MG TABLET    Take 40 mg by mouth daily.   GUAIFENESIN (ROBITUSSIN) 100 MG/5ML LIQUID    Take 200 mg by mouth. Give 73ml every 6 hours as needed for cough   INSULIN ASPART (NOVOLOG) 100 UNIT/ML INJECTION    Inject 5 Units into the skin 3 (three) times daily before meals. For CBG>150   INSULIN GLARGINE (LANTUS) 100 UNIT/ML INJECTION    Inject 54 Units into the skin at bedtime.   LEVOTHYROXINE (SYNTHROID, LEVOTHROID) 75 MCG TABLET    Take 75 mcg by mouth daily.    METOPROLOL TARTRATE (LOPRESSOR) 25 MG TABLET    Take 25 mg by mouth 2 (two) times daily.   SPIRONOLACTONE (ALDACTONE) 25 MG TABLET    Take 25 mg by mouth daily.   TIOTROPIUM  (SPIRIVA) 18 MCG INHALATION CAPSULE    Place 18 mcg into inhaler and inhale daily. 2 puffs daily   VITAMIN B-12 (CYANOCOBALAMIN) 1000 MCG TABLET    Take 1,000 mcg by mouth daily.  Modified Medications   No medications on file  Discontinued Medications   No medications on file    Physical Exam: Filed Vitals:   08/08/15 1445  BP: 130/82  Pulse: 72  Temp: 98 F (36.7 C)  TempSrc: Tympanic  Resp: 20   There is no weight on file to calculate BMI.  Physical Exam  Constitutional: He is oriented to person, place, and time. He appears well-developed and well-nourished. No distress.  HENT:  Head: Normocephalic and atraumatic.  Right Ear: External ear normal.  Left Ear: External ear normal.  Nose: Nose normal.  Mouth/Throat: Oropharynx is clear and moist. No oropharyngeal exudate.  Eyes: Conjunctivae and EOM are normal. Pupils are equal, round, and reactive to light.  Neck: Normal range of motion. Neck supple. No JVD present.  No thyromegaly present.  Cardiovascular: Normal rate, regular rhythm and normal heart sounds.   No murmur heard. Pulmonary/Chest: Effort normal. No respiratory distress. He has no wheezes. He has rales in the right lower field and the left lower field. He exhibits no tenderness.  Abdominal: Soft. Bowel sounds are normal. He exhibits no distension and no mass. There is no tenderness. There is no rebound.  Musculoskeletal: Normal range of motion. He exhibits edema (trace in BLE). He exhibits no tenderness.  1+ edema BLE  Lymphadenopathy:    He has no cervical adenopathy.  Neurological: He is alert and oriented to person, place, and time. He has normal reflexes. No cranial nerve deficit. Coordination normal.  Skin: Skin is warm and dry. No rash noted. He is not diaphoretic. No erythema.  Psychiatric: Thought content normal. His mood appears anxious. His affect is angry and inappropriate. His affect is not blunt and not labile. His speech is not delayed, not tangential  and not slurred. He is agitated and aggressive. He is not hyperactive, not slowed, not withdrawn, not actively hallucinating and not combative. Cognition and memory are impaired. He exhibits a depressed mood. He is communicative. He exhibits abnormal recent memory. He is attentive.    Labs reviewed: Basic Metabolic Panel:  Recent Labs  08/11/14 0926 09/22/14 0905 05/24/15  NA 134* 135* 134*  K 4.6 5.6* 4.6  CO2 25 25  --   GLUCOSE 154* 210*  --   BUN 41.3* 34.4* 47*  CREATININE 1.7* 1.7* 1.5*  CALCIUM 8.6 9.0  --     Liver Function Tests:  Recent Labs  08/11/14 0926 09/22/14 0905 05/24/15  AST 12 10 13*  ALT 9 11 16   ALKPHOS 100 109 79  BILITOT 0.37 0.39  --   PROT 6.8 6.2*  --   ALBUMIN 3.3* 3.1*  --     CBC:  Recent Labs  05/19/15 0757 06/16/15 0746 07/14/15 0804  WBC 4.7 4.5 5.6  NEUTROABS 2.7 3.2 4.3  HGB 7.7* 7.7* 7.7*  HCT 23.3* 23.0* 23.2*  MCV 100.9* 104.5* 105.0*  PLT 63* 66* 78*    Lab Results  Component Value Date   TSH 3.43 05/24/2015   Lab Results  Component Value Date   HGBA1C 8.2* 05/24/2015   No results found for: CHOL, HDL, LDLCALC, LDLDIRECT, TRIG, CHOLHDL  Significant Diagnostic Results since last visit: none  Patient Care Team: Estill Dooms, MD as PCP - General (Internal Medicine) Man Mast X, NP as Nurse Practitioner (Nurse Practitioner)  Assessment/Plan Problem List Items Addressed This Visit    Type 2 diabetes mellitus with diabetic chronic kidney disease ([place]) - Primary (Chronic)    05/24/15 Hgb A1c 8.2 Continue Lantus 54 qd Novolog 12u for CBG >450 instead of 5u with meals.      MDS (myelodysplastic syndrome) (HCC) (Chronic)    Continue f/u oncology, prn blood transfusion.       Hypothyroidism (Chronic)    Controlled, last TSH 3.433 05/24/15, continue Levothyroxine 48mcg daily.       Essential hypertension, benign (Chronic)    Controlled, continue Metoprolol 25mg  bid. off Losartan due to CKD. Creatinine  1.5-2.0      Duodenal ulcer    Stable, no change in tx,  no c/o stomach pain, indigestion, or melena.      Depression    Mood is stable, continue Cymbalta 60mg  daily      Dementia (Chronic)    Gradual decline, off Namenda R>B in setting of bone  marrow dysplasia . Continue SNF for care needs      CKD (chronic kidney disease) (Chronic)    05/24/15 Bun/creat 47/1.53      CHF (congestive heart failure) (HCC) (Chronic)    Minimal BLE edema has no change, continue Furosemide 40mg , Spironolactone 25mg        Anemia in chronic kidney disease (Chronic)    F/u oncology, blood transfusion prn, no active bleeding.       Acute bronchitis    Dc DuoNeb tid, fully treated with Levaquin 500mg  qd x7days, improved chronic cough, denied chest pain, continue Prednisone 5mg  daily          Family/ staff Communication: continue to observe the patient.   Labs/tests ordered: none  ManXie Mast NP [facility] [address], [place] [address] On Call:  [phone] & follow prompts after 5pm & weekends Office Phone:  [phone] Office Fax:  [phone]

## 2015-08-08 NOTE — Assessment & Plan Note (Signed)
F/u oncology, blood transfusion prn, no active bleeding.  

## 2015-08-08 NOTE — Assessment & Plan Note (Signed)
Controlled, continue Metoprolol 25mg bid. off Losartan due to CKD. Creatinine 1.5-2.0 

## 2015-08-08 NOTE — Assessment & Plan Note (Signed)
Continue f/u oncology, prn blood transfusion.  

## 2015-08-08 NOTE — Assessment & Plan Note (Signed)
05/24/15 Bun/creat 47/1.53

## 2015-08-11 ENCOUNTER — Ambulatory Visit (HOSPITAL_BASED_OUTPATIENT_CLINIC_OR_DEPARTMENT_OTHER): Payer: Medicare Other

## 2015-08-11 ENCOUNTER — Other Ambulatory Visit: Payer: Self-pay | Admitting: Hematology and Oncology

## 2015-08-11 ENCOUNTER — Other Ambulatory Visit (HOSPITAL_BASED_OUTPATIENT_CLINIC_OR_DEPARTMENT_OTHER): Payer: Medicare Other

## 2015-08-11 VITALS — BP 123/49 | HR 67 | Temp 97.7°F | Resp 20

## 2015-08-11 DIAGNOSIS — D469 Myelodysplastic syndrome, unspecified: Secondary | ICD-10-CM

## 2015-08-11 DIAGNOSIS — N189 Chronic kidney disease, unspecified: Secondary | ICD-10-CM

## 2015-08-11 DIAGNOSIS — D631 Anemia in chronic kidney disease: Secondary | ICD-10-CM

## 2015-08-11 LAB — CBC WITH DIFFERENTIAL/PLATELET
BASO%: 0 % (ref 0.0–2.0)
BASOS ABS: 0 10*3/uL (ref 0.0–0.1)
EOS%: 2.9 % (ref 0.0–7.0)
Eosinophils Absolute: 0.2 10*3/uL (ref 0.0–0.5)
HCT: 23.4 % — ABNORMAL LOW (ref 38.4–49.9)
HEMOGLOBIN: 7.9 g/dL — AB (ref 13.0–17.1)
LYMPH#: 1.3 10*3/uL (ref 0.9–3.3)
LYMPH%: 24.1 % (ref 14.0–49.0)
MCH: 34.3 pg — ABNORMAL HIGH (ref 27.2–33.4)
MCHC: 33.8 g/dL (ref 32.0–36.0)
MCV: 101.7 fL — AB (ref 79.3–98.0)
MONO#: 0.4 10*3/uL (ref 0.1–0.9)
MONO%: 6.6 % (ref 0.0–14.0)
NEUT%: 66.4 % (ref 39.0–75.0)
NEUTROS ABS: 3.6 10*3/uL (ref 1.5–6.5)
NRBC: 0 % (ref 0–0)
Platelets: 65 10*3/uL — ABNORMAL LOW (ref 140–400)
RBC: 2.3 10*6/uL — ABNORMAL LOW (ref 4.20–5.82)
RDW: 22.6 % — AB (ref 11.0–14.6)
WBC: 5.4 10*3/uL (ref 4.0–10.3)

## 2015-08-11 LAB — TECHNOLOGIST REVIEW

## 2015-08-11 LAB — PREPARE RBC (CROSSMATCH)

## 2015-08-11 MED ORDER — DIPHENHYDRAMINE HCL 25 MG PO CAPS
25.0000 mg | ORAL_CAPSULE | Freq: Once | ORAL | Status: AC
  Administered 2015-08-11: 25 mg via ORAL

## 2015-08-11 MED ORDER — ACETAMINOPHEN 325 MG PO TABS
ORAL_TABLET | ORAL | Status: AC
  Filled 2015-08-11: qty 2

## 2015-08-11 MED ORDER — DIPHENHYDRAMINE HCL 25 MG PO CAPS
ORAL_CAPSULE | ORAL | Status: AC
  Filled 2015-08-11: qty 1

## 2015-08-11 MED ORDER — SODIUM CHLORIDE 0.9 % IV SOLN
250.0000 mL | Freq: Once | INTRAVENOUS | Status: AC
  Administered 2015-08-11: 250 mL via INTRAVENOUS

## 2015-08-11 MED ORDER — ACETAMINOPHEN 325 MG PO TABS
650.0000 mg | ORAL_TABLET | Freq: Once | ORAL | Status: AC
  Administered 2015-08-11: 650 mg via ORAL

## 2015-08-11 NOTE — Patient Instructions (Signed)

## 2015-08-12 LAB — TYPE AND SCREEN
ABO/RH(D): O POS
Antibody Screen: NEGATIVE
UNIT DIVISION: 0
UNIT DIVISION: 0

## 2015-08-18 IMAGING — CT CT BIOPSY
1 of 2 series · 2 of 6 positions shown, 5 images · non-contrast
Comparison: none

CLINICAL DATA: [AGE] with refractory anemia.

[Series 4: add scan 5.0 b70f · axial · 0.74mm/px · z∈[+1005,+1010]mm · 2 of 4 slices shown, 5 images]
[im 2/4  soft-tissue]
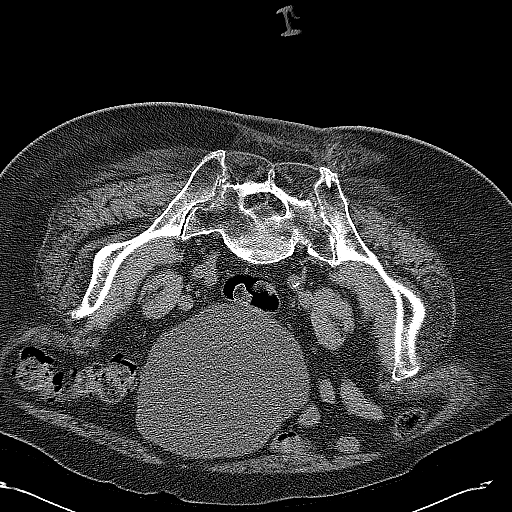
[im 2/4  lung]
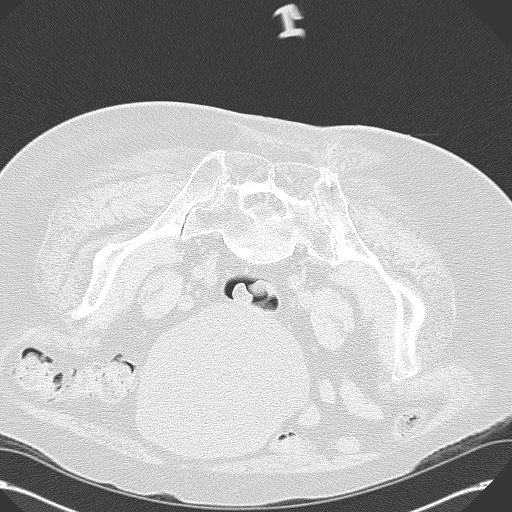
[im 2/4  bone]
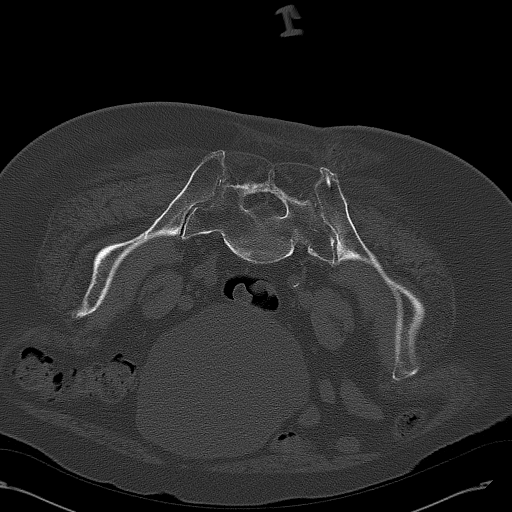
[im 3/4  soft-tissue]
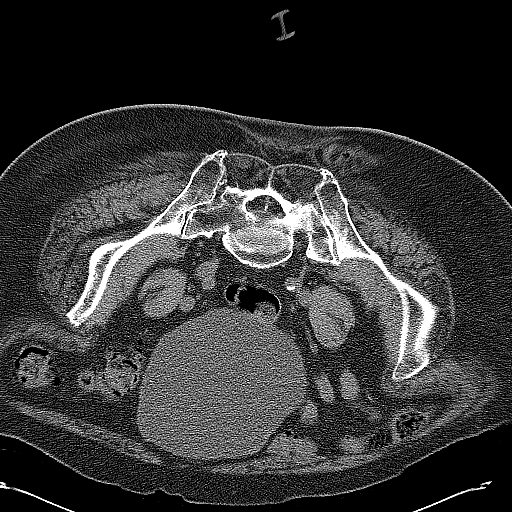
[im 3/4  lung]
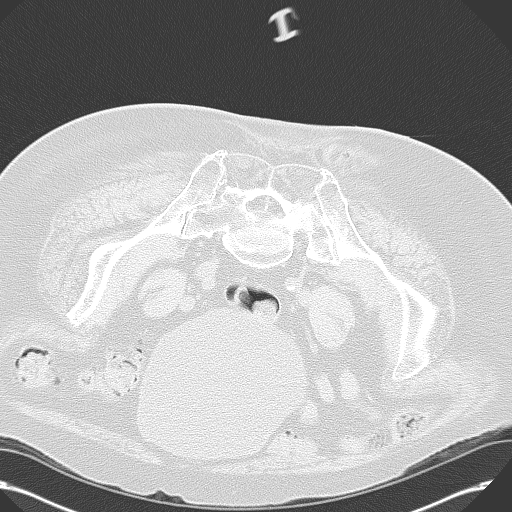

[2 of 6 positions shown; findings below may reference images not displayed]

EXAM:
CT GUIDED BONE MARROW ASPIRATES AND BIOPSY

MEDICATIONS:
12.5 mcg fentanyl

ANESTHESIA/SEDATION:
Patient was monitored by a radiology nurse during the procedure.

PROCEDURE:
The procedure was explained to the patient. The risks and benefits
of the procedure were discussed and the patient's questions were
addressed. Informed consent was obtained from the patient. The
patient was placed prone on CT scan. Images of the pelvis were
obtained. The right side of back was prepped and draped in sterile
fashion. The skin and right posterior iliac bone were anesthetized
with 1% lidocaine. 11 gauge bone needle was directed into the right
iliac bone with CT guidance. Two aspirates and one core biopsy
obtained.
FINDINGS: Bone needle directed into the posterior right iliac bone.

COMPLICATIONS:
None
IMPRESSION: CT guided bone marrow aspirates and core biopsy.

## 2015-08-31 ENCOUNTER — Ambulatory Visit (HOSPITAL_COMMUNITY)
Admission: RE | Admit: 2015-08-31 | Discharge: 2015-08-31 | Disposition: A | Discharge status: Home / Self Care | Payer: Medicare Other | Source: Ambulatory Visit | Attending: Hematology | Admitting: Hematology

## 2015-08-31 DIAGNOSIS — D469 Myelodysplastic syndrome, unspecified: Secondary | ICD-10-CM | POA: Insufficient documentation

## 2015-09-01 ENCOUNTER — Encounter: Payer: Self-pay | Admitting: Nurse Practitioner

## 2015-09-01 ENCOUNTER — Non-Acute Institutional Stay (SKILLED_NURSING_FACILITY): Payer: Medicare Other | Admitting: Nurse Practitioner

## 2015-09-01 DIAGNOSIS — F32A Depression, unspecified: Secondary | ICD-10-CM

## 2015-09-01 DIAGNOSIS — N183 Chronic kidney disease, stage 3 (moderate): Secondary | ICD-10-CM | POA: Diagnosis not present

## 2015-09-01 DIAGNOSIS — N189 Chronic kidney disease, unspecified: Secondary | ICD-10-CM | POA: Diagnosis not present

## 2015-09-01 DIAGNOSIS — E1122 Type 2 diabetes mellitus with diabetic chronic kidney disease: Secondary | ICD-10-CM | POA: Diagnosis not present

## 2015-09-01 DIAGNOSIS — I5020 Unspecified systolic (congestive) heart failure: Secondary | ICD-10-CM

## 2015-09-01 DIAGNOSIS — D469 Myelodysplastic syndrome, unspecified: Secondary | ICD-10-CM

## 2015-09-01 DIAGNOSIS — D631 Anemia in chronic kidney disease: Secondary | ICD-10-CM | POA: Diagnosis not present

## 2015-09-01 DIAGNOSIS — F329 Major depressive disorder, single episode, unspecified: Secondary | ICD-10-CM | POA: Diagnosis not present

## 2015-09-01 DIAGNOSIS — J209 Acute bronchitis, unspecified: Secondary | ICD-10-CM

## 2015-09-01 DIAGNOSIS — K269 Duodenal ulcer, unspecified as acute or chronic, without hemorrhage or perforation: Secondary | ICD-10-CM | POA: Diagnosis not present

## 2015-09-01 DIAGNOSIS — I10 Essential (primary) hypertension: Secondary | ICD-10-CM | POA: Diagnosis not present

## 2015-09-01 DIAGNOSIS — F0390 Unspecified dementia without behavioral disturbance: Secondary | ICD-10-CM | POA: Diagnosis not present

## 2015-09-01 DIAGNOSIS — E039 Hypothyroidism, unspecified: Secondary | ICD-10-CM | POA: Diagnosis not present

## 2015-09-01 NOTE — Assessment & Plan Note (Signed)
F/u oncology, blood transfusion prn, no active bleeding.  

## 2015-09-01 NOTE — Assessment & Plan Note (Signed)
Mood is stable, continue Cymbalta 60mg daily 

## 2015-09-01 NOTE — Assessment & Plan Note (Signed)
05/24/15 Hgb A1c 8.2 Continue Lantus 54 qd Novolog 12u for CBG >450 instead of 5u with meals.

## 2015-09-01 NOTE — Assessment & Plan Note (Signed)
Controlled, continue Metoprolol 25mg bid. off Losartan due to CKD. Creatinine 1.5-2.0 

## 2015-09-01 NOTE — Assessment & Plan Note (Signed)
Controlled, last TSH 3.433 05/24/15, continue Levothyroxine 75mcg daily.  

## 2015-09-01 NOTE — Progress Notes (Signed)
Patient ID: [name] [name], male   DOB: [birth year], 82 y.o.   MRN: [id number]

## 2015-09-01 NOTE — Progress Notes (Signed)
Patient ID: [name] [name], male   DOB: May 28, 1920, 82 y.o.   MRN: [id number]  Location:  SNF [facility] Provider:  Marlana Latus NP  Code Status:  DNR Goals of care: Advanced Directive information Does patient have an advance directive?: Yes, Type of Advance Directive: Living will;Out of facility DNR (pink MOST or yellow form)  Chief Complaint  Patient presents with  . Medical Management of Chronic Issues    Routine Visit     HPI: Patient is a 82 y.o. male seen in the SNF at [facility] today for evaluation of hx of dementia, PUD, anemia, T2DM with blood sugar controlled on Lantus , hypertension on Metoprolol, chronic edema is managed with Furosemide 40mg  and Spironolactone 25mg  depression on Cymbalta 60mg , and hypothyroidism on Levothyroxine 54mcg. MDS, prn blood transfusion per Oncology.    Review of Systems  Constitutional: Negative for fever and chills.  HENT: Positive for hearing loss. Negative for congestion, ear discharge and ear pain.   Eyes: Negative for pain, discharge and redness.  Respiratory: Positive for cough. Negative for shortness of breath and wheezing.   Cardiovascular: Positive for leg swelling. Negative for chest pain and palpitations.       Edema 1+ BLE  Gastrointestinal: Negative for nausea, vomiting, abdominal pain and constipation.  Genitourinary: Positive for frequency. Negative for dysuria and urgency.  Musculoskeletal: Negative for myalgias, back pain and neck pain.  Skin: Negative for rash.  Neurological: Negative for dizziness, tremors, seizures, weakness and headaches.  Endo/Heme/Allergies: Does not bruise/bleed easily.  Psychiatric/Behavioral: Positive for memory loss. Negative for suicidal ideas and hallucinations. The patient is nervous/anxious.     Past Medical History  Diagnosis Date  . Hypothyroidism   . Hypertension   . CHF (congestive heart failure) ([place])   . Diabetes mellitus   . Coronary artery disease   . Restless leg syndrome   .  Hyperlipemia   . Renal disease     stage II  . Sleep apnea   . Cellulitis and abscess of leg   . Obesity   . Duodenal ulcer, perforated ([place])   . Diverticulosis   . Internal hemorrhoids   . CAD (coronary artery disease)   . Anemia   . Duodenal ulcer   . Allergy   . Blood transfusion   . Cataract   . Tuberculosis     as a child  . Contact dermatitis and other eczema, due to unspecified cause 10/06/2012  . Senile dementia with depressive features 05/26/2012  . Hyposmolality and/or hyponatremia 04/02/2012  . Diarrhea 03/26/2012  . Hemorrhage of rectum and anus 03/25/2012  . Pneumonia, organism unspecified 03/12/2012  . 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 Folate-deficiency anemia 01/07/2012  . Thrombocytopenia, unspecified ([place]) 09/26/2011  . Loss of weight 09/11/2011  . Dysphagia, unspecified(787.20) 06/28/2011  . Chronic kidney disease, stage III (moderate) 03/22/2011  . Cramp of limb 08/10/2010  . Chronic kidney disease, stage II (mild) 03/22/2011  . Personal history of fall 05/05/2009  . Unspecified vitamin D deficiency 02/24/2009  . Blood in stool 11/18/2008  . Dermatophytosis of the body 06/23/2007  . Obesity, unspecified 06/23/2007  . Unspecified tinnitus 06/23/2007  . Edema 06/23/2007  . Shortness of breath 06/23/2007  . Flatulence, eructation, and gas pain 06/23/2007  . Other general symptoms(780.99) 06/23/2007  . Memory loss 08/13/2003  . Screening PSA (prostate specific antigen) 07/27/2014  . Prostate cancer screening 07/27/2014  . Weight loss 07/27/2014    Patient Active Problem List  Diagnosis Date Noted  . Acute bronchitis 06/13/2015  . Screening PSA (prostate specific antigen) 07/27/2014  . Prostate cancer screening 07/27/2014  . MDS (myelodysplastic syndrome) ([place]) 06/07/2014  . Closed nondisplaced fracture of fifth left metatarsal bone 03/24/2014  . Foot pain, left 03/22/2014  . Hyperkalemia 10/21/2013  .  Thrombocytopenia ([place]) 08/17/2013  . Infection of urinary tract 06/04/2013  . Unspecified gastritis and gastroduodenitis without mention of hemorrhage 04/15/2013  . Anemia in chronic kidney disease 01/05/2013  . Hypothyroidism 12/10/2012  . Fall at nursing home 10/22/2012  . Depression 10/22/2012  . Dementia 10/22/2012  . Essential hypertension, benign 10/22/2012  . Duodenal ulcer 09/13/2011  . CHF (congestive heart failure) ([place]) 09/13/2011  . CKD (chronic kidney disease) 09/13/2011  . Respiratory failure ([place]) 09/13/2011  . Type 2 diabetes mellitus with diabetic chronic kidney disease ([place]) 09/13/2011    Allergies  Allergen Reactions  . Enalapril Other (See Comments)    cough  . Metformin And Related Diarrhea  . Milk-Related Compounds Other (See Comments)    Doesn't remember but its a mild reaction    Medications: Patient's Medications  New Prescriptions   No medications on file  Previous Medications   ACETAMINOPHEN (TYLENOL) 325 MG TABLET    Take 650 mg by mouth every 4 (four) hours as needed (for pain).    BENEPROTEIN PO    Take by mouth. Take 2 scoops by mouth with beverage of choice twice daily   DULOXETINE (CYMBALTA) 60 MG CAPSULE    Take 60 mg by mouth daily.   FAMOTIDINE (PEPCID) 20 MG TABLET    Take 1 tablet by mouth at bedtime   FOLIC ACID (FOLVITE) 1 MG TABLET    Take 1 mg by mouth daily.    FUROSEMIDE (LASIX) 40 MG TABLET    Take 40 mg by mouth daily.   GUAIFENESIN (ROBITUSSIN) 100 MG/5ML LIQUID    Take 200 mg by mouth. Give 60ml every 6 hours as needed for cough   INSULIN ASPART (NOVOLOG) 100 UNIT/ML INJECTION    Inject 5 Units into the skin 3 (three) times daily before meals. For CBG>150   INSULIN GLARGINE (LANTUS) 100 UNIT/ML INJECTION    Inject 54 Units into the skin at bedtime.   LEVOTHYROXINE (SYNTHROID, LEVOTHROID) 75 MCG TABLET    Take 75 mcg by mouth daily.    METOPROLOL TARTRATE (LOPRESSOR) 25 MG TABLET    Take 25 mg by mouth 2 (two) times daily.    PREDNISONE (DELTASONE) 5 MG TABLET    Take 5 mg by mouth daily.   SPIRONOLACTONE (ALDACTONE) 25 MG TABLET    Take 25 mg by mouth daily.   VITAMIN B-12 (CYANOCOBALAMIN) 1000 MCG TABLET    Take 1,000 mcg by mouth daily.  Modified Medications   No medications on file  Discontinued Medications   FLUTICASONE-SALMETEROL (ADVAIR HFA) 45-21 MCG/ACT INHALER    Inhale 2 puffs into the lungs every 12 (twelve) hours.    TIOTROPIUM (SPIRIVA) 18 MCG INHALATION CAPSULE    Place 18 mcg into inhaler and inhale daily. 2 puffs daily    Physical Exam: Filed Vitals:   09/01/15 0843  BP: 130/82  Pulse: 72  Temp: 98 F (36.7 C)  TempSrc: Oral  Resp: 20  Height: 5\' 8"  (1.727 m)  Weight: 222 lb (100.699 kg)   Body mass index is 33.76 kg/(m^2).  Physical Exam  Constitutional: He is oriented to person, place, and time. He appears well-developed and well-nourished. No distress.  HENT:  Head: Normocephalic and atraumatic.  Right Ear: External ear normal.  Left Ear: External ear normal.  Nose: Nose normal.  Mouth/Throat: Oropharynx is clear and moist. No oropharyngeal exudate.  Eyes: Conjunctivae and EOM are normal. Pupils are equal, round, and reactive to light.  Neck: Normal range of motion. Neck supple. No JVD present. No thyromegaly present.  Cardiovascular: Normal rate, regular rhythm and normal heart sounds.   No murmur heard. Pulmonary/Chest: Effort normal. No respiratory distress. He has no wheezes. He has rales in the right lower field and the left lower field. He exhibits no tenderness.  Abdominal: Soft. Bowel sounds are normal. He exhibits no distension and no mass. There is no tenderness. There is no rebound.  Musculoskeletal: Normal range of motion. He exhibits edema (trace in BLE). He exhibits no tenderness.  1+ edema BLE  Lymphadenopathy:    He has no cervical adenopathy.  Neurological: He is alert and oriented to person, place, and time. He has normal reflexes. No cranial nerve deficit.  Coordination normal.  Skin: Skin is warm and dry. No rash noted. He is not diaphoretic. No erythema.  Psychiatric: Thought content normal. His mood appears anxious. His affect is angry and inappropriate. His affect is not blunt and not labile. His speech is not delayed, not tangential and not slurred. He is agitated and aggressive. He is not hyperactive, not slowed, not withdrawn, not actively hallucinating and not combative. Cognition and memory are impaired. He exhibits a depressed mood. He is communicative. He exhibits abnormal recent memory. He is attentive.    Labs reviewed: Basic Metabolic Panel:  Recent Labs  09/22/14 0905 05/24/15  NA 135* 134*  K 5.6* 4.6  CO2 25  --   GLUCOSE 210*  --   BUN 34.4* 47*  CREATININE 1.7* 1.5*  CALCIUM 9.0  --     Liver Function Tests:  Recent Labs  09/22/14 0905 05/24/15  AST 10 13*  ALT 11 16  ALKPHOS 109 79  BILITOT 0.39  --   PROT 6.2*  --   ALBUMIN 3.1*  --     CBC:  Recent Labs  06/16/15 0746 07/14/15 0804 08/11/15 0755  WBC 4.5 5.6 5.4  NEUTROABS 3.2 4.3 3.6  HGB 7.7* 7.7* 7.9*  HCT 23.0* 23.2* 23.4*  MCV 104.5* 105.0* 101.7*  PLT 66* 78* 65*    Lab Results  Component Value Date   TSH 3.43 05/24/2015   Lab Results  Component Value Date   HGBA1C 8.2* 05/24/2015   No results found for: CHOL, HDL, LDLCALC, LDLDIRECT, TRIG, CHOLHDL  Significant Diagnostic Results since last visit: none  Patient Care Team: Estill Dooms, MD as PCP - General (Internal Medicine) Man Mast X, NP as Nurse Practitioner (Nurse Practitioner)  Assessment/Plan Problem List Items Addressed This Visit    CHF (congestive heart failure) ([place]) - Primary (Chronic)    Minimal BLE edema has no change, continue Furosemide 40mg , Spironolactone 25mg        CKD (chronic kidney disease) (Chronic)    05/24/15 Bun/creat 47/1.53      Type 2 diabetes mellitus with diabetic chronic kidney disease ([place]) (Chronic)    05/24/15 Hgb A1c 8.2 Continue  Lantus 54 qd Novolog 12u for CBG >450 instead of 5u with meals.      Dementia (Chronic)    Gradual decline, off Namenda R>B in setting of bone marrow dysplasia . Continue SNF for care needs      Essential hypertension, benign (Chronic)    Controlled, continue  Metoprolol 25mg  bid. off Losartan due to CKD. Creatinine 1.5-2.0      Hypothyroidism (Chronic)    Controlled, last TSH 3.433 05/24/15, continue Levothyroxine 58mcg daily.      Anemia in chronic kidney disease (Chronic)    F/u oncology, blood transfusion prn, no active bleeding.       MDS (myelodysplastic syndrome) ([facility]) (Chronic)    Continue f/u oncology, prn blood transfusion.      Relevant Medications   predniSONE (DELTASONE) 5 MG tablet   Duodenal ulcer    Stable, no change in tx,  no c/o stomach pain, indigestion, or melena.       Depression    Mood is stable, continue Cymbalta 60mg  daily      Acute bronchitis    Stable, chronic cough, continue  Prednisone 5mg  daily          Family/ staff Communication: continue to observe the patient.   Labs/tests ordered: none  ManXie Mast NP Geriatrics [facility] [address],  [address]   On Call:  [phone] & follow prompts after 5pm & weekends Office Phone:  [phone] Office Fax:  [phone]

## 2015-09-01 NOTE — Assessment & Plan Note (Signed)
Gradual decline, off Namenda R>B in setting of bone marrow dysplasia . Continue SNF for care needs 

## 2015-09-01 NOTE — Assessment & Plan Note (Signed)
Minimal BLE edema has no change, continue Furosemide 40mg , Spironolactone 25mg 

## 2015-09-01 NOTE — Assessment & Plan Note (Signed)
Stable, chronic cough, continue  Prednisone 5mg  daily

## 2015-09-01 NOTE — Assessment & Plan Note (Signed)
05/24/15 Bun/creat 47/1.53

## 2015-09-01 NOTE — Assessment & Plan Note (Signed)
Stable, no change in tx,  no c/o stomach pain, indigestion, or melena.   

## 2015-09-01 NOTE — Assessment & Plan Note (Signed)
Continue f/u oncology, prn blood transfusion.  

## 2015-09-05 ENCOUNTER — Encounter: Payer: Self-pay | Admitting: Nurse Practitioner

## 2015-09-05 ENCOUNTER — Non-Acute Institutional Stay (SKILLED_NURSING_FACILITY): Payer: Medicare Other | Admitting: Nurse Practitioner

## 2015-09-05 DIAGNOSIS — F329 Major depressive disorder, single episode, unspecified: Secondary | ICD-10-CM | POA: Diagnosis not present

## 2015-09-05 DIAGNOSIS — N182 Chronic kidney disease, stage 2 (mild): Secondary | ICD-10-CM

## 2015-09-05 DIAGNOSIS — F0390 Unspecified dementia without behavioral disturbance: Secondary | ICD-10-CM

## 2015-09-05 DIAGNOSIS — F32A Depression, unspecified: Secondary | ICD-10-CM

## 2015-09-05 DIAGNOSIS — D631 Anemia in chronic kidney disease: Secondary | ICD-10-CM

## 2015-09-05 DIAGNOSIS — K269 Duodenal ulcer, unspecified as acute or chronic, without hemorrhage or perforation: Secondary | ICD-10-CM | POA: Diagnosis not present

## 2015-09-05 DIAGNOSIS — N189 Chronic kidney disease, unspecified: Secondary | ICD-10-CM | POA: Diagnosis not present

## 2015-09-05 DIAGNOSIS — I10 Essential (primary) hypertension: Secondary | ICD-10-CM | POA: Diagnosis not present

## 2015-09-05 DIAGNOSIS — D469 Myelodysplastic syndrome, unspecified: Secondary | ICD-10-CM

## 2015-09-05 DIAGNOSIS — I5020 Unspecified systolic (congestive) heart failure: Secondary | ICD-10-CM | POA: Diagnosis not present

## 2015-09-05 DIAGNOSIS — J209 Acute bronchitis, unspecified: Secondary | ICD-10-CM | POA: Diagnosis not present

## 2015-09-05 DIAGNOSIS — E039 Hypothyroidism, unspecified: Secondary | ICD-10-CM | POA: Diagnosis not present

## 2015-09-05 DIAGNOSIS — E1122 Type 2 diabetes mellitus with diabetic chronic kidney disease: Secondary | ICD-10-CM

## 2015-09-05 NOTE — Progress Notes (Signed)
Patient ID: [name] [name], male   DOB: Sep 16, 1919, 82 y.o.   MRN: [id number]

## 2015-09-07 NOTE — Assessment & Plan Note (Signed)
Controlled, continue Metoprolol 25mg bid. off Losartan due to CKD. Creatinine 1.5-2.0 

## 2015-09-07 NOTE — Assessment & Plan Note (Signed)
Mood is stable, continue Cymbalta 60mg daily 

## 2015-09-07 NOTE — Assessment & Plan Note (Signed)
Minimal BLE edema has no change, continue Furosemide 40mg , Spironolactone 25mg 

## 2015-09-07 NOTE — Assessment & Plan Note (Signed)
F/u oncology, blood transfusion prn, no active bleeding.  

## 2015-09-07 NOTE — Assessment & Plan Note (Signed)
Worsened chronic cough, continue  Prednisone 5mg  daily, Avelox 400mg  daily x 7 days.

## 2015-09-07 NOTE — Assessment & Plan Note (Signed)
Continue f/u oncology, prn blood transfusion.  

## 2015-09-07 NOTE — Assessment & Plan Note (Signed)
Stable, no change in tx,  no c/o stomach pain, indigestion, or melena.   

## 2015-09-07 NOTE — Assessment & Plan Note (Signed)
Controlled, last TSH 3.433 05/24/15, continue Levothyroxine 75mcg daily.  

## 2015-09-07 NOTE — Progress Notes (Signed)
Patient ID: [name] [name], male   DOB: Aug 27, 1919, 82 y.o.   MRN: [id number]  Location:  SNF [facility] Provider:  Marlana Latus NP  Code Status:  DNR Goals of care: Advanced Directive information Does patient have an advance directive?: Yes, Type of Advance Directive: Out of facility DNR (pink MOST or yellow form);Living will  Chief Complaint  Patient presents with  . Acute Visit    cough     HPI: Patient is a 82 y.o. male seen in the SNF at [facility] today for evaluation of left arm pain, congestive cough, afebrile, no O2 desaturation. hx of dementia, PUD, anemia, T2DM with blood sugar controlled on Lantus , hypertension on Metoprolol, chronic edema is managed with Furosemide 40mg  and Spironolactone 25mg  depression on Cymbalta 60mg , and hypothyroidism on Levothyroxine 89mcg. MDS, prn blood transfusion per Oncology.    Review of Systems  Constitutional: Negative for fever and chills.  HENT: Positive for hearing loss. Negative for congestion, ear discharge and ear pain.   Eyes: Negative for pain, discharge and redness.  Respiratory: Positive for cough. Negative for shortness of breath and wheezing.   Cardiovascular: Positive for leg swelling. Negative for chest pain and palpitations.       Edema 1+ BLE  Gastrointestinal: Negative for nausea, vomiting, abdominal pain and constipation.  Genitourinary: Positive for frequency. Negative for dysuria and urgency.  Musculoskeletal: Negative for myalgias, back pain and neck pain.       Left forearm pain, X-ray 09/04/14 negative study.   Skin: Negative for rash.  Neurological: Negative for dizziness, tremors, seizures, weakness and headaches.  Endo/Heme/Allergies: Does not bruise/bleed easily.  Psychiatric/Behavioral: Positive for memory loss. Negative for suicidal ideas and hallucinations. The patient is nervous/anxious.     Past Medical History  Diagnosis Date  . Hypothyroidism   . Hypertension   . CHF (congestive heart failure)  ([place])   . Diabetes mellitus   . Coronary artery disease   . Restless leg syndrome   . Hyperlipemia   . Renal disease     stage II  . Sleep apnea   . Cellulitis and abscess of leg   . Obesity   . Duodenal ulcer, perforated ([place])   . Diverticulosis   . Internal hemorrhoids   . CAD (coronary artery disease)   . Anemia   . Duodenal ulcer   . Allergy   . Blood transfusion   . Cataract   . Tuberculosis     as a child  . Contact dermatitis and other eczema, due to unspecified cause 10/06/2012  . Senile dementia with depressive features 05/26/2012  . Hyposmolality and/or hyponatremia 04/02/2012  . Diarrhea 03/26/2012  . Hemorrhage of rectum and anus 03/25/2012  . Pneumonia, organism unspecified 03/12/2012  . 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 Folate-deficiency anemia 01/07/2012  . Thrombocytopenia, unspecified ([place]) 09/26/2011  . Loss of weight 09/11/2011  . Dysphagia, unspecified(787.20) 06/28/2011  . Chronic kidney disease, stage III (moderate) 03/22/2011  . Cramp of limb 08/10/2010  . Chronic kidney disease, stage II (mild) 03/22/2011  . Personal history of fall 05/05/2009  . Unspecified vitamin D deficiency 02/24/2009  . Blood in stool 11/18/2008  . Dermatophytosis of the body 06/23/2007  . Obesity, unspecified 06/23/2007  . Unspecified tinnitus 06/23/2007  . Edema 06/23/2007  . Shortness of breath 06/23/2007  . Flatulence, eructation, and gas pain 06/23/2007  . Other general symptoms(780.99) 06/23/2007  . Memory loss 08/13/2003  . Screening PSA (prostate specific antigen)  07/27/2014  . Prostate cancer screening 07/27/2014  . Weight loss 07/27/2014    Patient Active Problem List   Diagnosis Date Noted  . Acute bronchitis 06/13/2015  . Screening PSA (prostate specific antigen) 07/27/2014  . Prostate cancer screening 07/27/2014  . MDS (myelodysplastic syndrome) ([place]) 06/07/2014  . Closed nondisplaced fracture of fifth left metatarsal  bone 03/24/2014  . Foot pain, left 03/22/2014  . Hyperkalemia 10/21/2013  . Thrombocytopenia ([place]) 08/17/2013  . Infection of urinary tract 06/04/2013  . Unspecified gastritis and gastroduodenitis without mention of hemorrhage 04/15/2013  . Anemia in chronic kidney disease 01/05/2013  . Hypothyroidism 12/10/2012  . Fall at nursing home 10/22/2012  . Depression 10/22/2012  . Dementia 10/22/2012  . Essential hypertension, benign 10/22/2012  . Duodenal ulcer 09/13/2011  . CHF (congestive heart failure) ([place]) 09/13/2011  . CKD (chronic kidney disease) 09/13/2011  . Respiratory failure ([place]) 09/13/2011  . Type 2 diabetes mellitus with diabetic chronic kidney disease ([place]) 09/13/2011    Allergies  Allergen Reactions  . Enalapril Other (See Comments)    cough  . Metformin And Related Diarrhea  . Milk-Related Compounds Other (See Comments)    Doesn't remember but its a mild reaction    Medications: Patient's Medications  New Prescriptions   No medications on file  Previous Medications   ACETAMINOPHEN (TYLENOL) 325 MG TABLET    Take 650 mg by mouth every 4 (four) hours as needed (for pain).    BENEPROTEIN PO    Take by mouth. Take 2 scoops by mouth with beverage of choice twice daily   DULOXETINE (CYMBALTA) 60 MG CAPSULE    Take 60 mg by mouth daily.   FAMOTIDINE (PEPCID) 20 MG TABLET    Take 1 tablet by mouth at bedtime   FOLIC ACID (FOLVITE) 1 MG TABLET    Take 1 mg by mouth daily.    FUROSEMIDE (LASIX) 40 MG TABLET    Take 40 mg by mouth daily.   GUAIFENESIN (ROBITUSSIN) 100 MG/5ML LIQUID    Take 200 mg by mouth. Give 28ml every 6 hours as needed for cough   INSULIN ASPART (NOVOLOG) 100 UNIT/ML INJECTION    CBG before meals and give 5 units For CBG>150 **If CBG >450 give 12 units instead of 5 units   INSULIN GLARGINE (LANTUS) 100 UNIT/ML INJECTION    Inject 54 Units into the skin at bedtime.   LEVOTHYROXINE (SYNTHROID, LEVOTHROID) 75 MCG TABLET    Take 75 mcg by mouth daily.     METOPROLOL TARTRATE (LOPRESSOR) 25 MG TABLET    Take 25 mg by mouth 2 (two) times daily.   PREDNISONE (DELTASONE) 5 MG TABLET    Take 5 mg by mouth daily.   SPIRONOLACTONE (ALDACTONE) 25 MG TABLET    Take 25 mg by mouth daily.   VITAMIN B-12 (CYANOCOBALAMIN) 1000 MCG TABLET    Take 1,000 mcg by mouth daily.  Modified Medications   No medications on file  Discontinued Medications   No medications on file    Physical Exam: Filed Vitals:   09/05/15 1227  BP: 130/80  Pulse: 72  Temp: 98 F (36.7 C)  TempSrc: Oral  Resp: 20  Height: 5\' 8"  (1.727 m)  Weight: 222 lb (100.699 kg)   Body mass index is 33.76 kg/(m^2).  Physical Exam  Constitutional: He is oriented to person, place, and time. He appears well-developed and well-nourished. No distress.  HENT:  Head: Normocephalic and atraumatic.  Right Ear: External ear normal.  Left  Ear: External ear normal.  Nose: Nose normal.  Mouth/Throat: Oropharynx is clear and moist. No oropharyngeal exudate.  Eyes: Conjunctivae and EOM are normal. Pupils are equal, round, and reactive to light.  Neck: Normal range of motion. Neck supple. No JVD present. No thyromegaly present.  Cardiovascular: Normal rate, regular rhythm and normal heart sounds.   No murmur heard. Pulmonary/Chest: Effort normal. No respiratory distress. He has no wheezes. He has rales in the right lower field and the left lower field. He exhibits no tenderness.  Abdominal: Soft. Bowel sounds are normal. He exhibits no distension and no mass. There is no tenderness. There is no rebound.  Musculoskeletal: Normal range of motion. He exhibits edema (trace in BLE). He exhibits no tenderness.  1+ edema BLE  Lymphadenopathy:    He has no cervical adenopathy.  Neurological: He is alert and oriented to person, place, and time. He has normal reflexes. No cranial nerve deficit. Coordination normal.  Skin: Skin is warm and dry. No rash noted. He is not diaphoretic. No erythema.    Psychiatric: Thought content normal. His mood appears anxious. His affect is angry and inappropriate. His affect is not blunt and not labile. His speech is not delayed, not tangential and not slurred. He is agitated and aggressive. He is not hyperactive, not slowed, not withdrawn, not actively hallucinating and not combative. Cognition and memory are impaired. He exhibits a depressed mood. He is communicative. He exhibits abnormal recent memory. He is attentive.    Labs reviewed: Basic Metabolic Panel:  Recent Labs  09/22/14 0905 05/24/15  NA 135* 134*  K 5.6* 4.6  CO2 25  --   GLUCOSE 210*  --   BUN 34.4* 47*  CREATININE 1.7* 1.5*  CALCIUM 9.0  --     Liver Function Tests:  Recent Labs  09/22/14 0905 05/24/15  AST 10 13*  ALT 11 16  ALKPHOS 109 79  BILITOT 0.39  --   PROT 6.2*  --   ALBUMIN 3.1*  --     CBC:  Recent Labs  06/16/15 0746 07/14/15 0804 08/11/15 0755  WBC 4.5 5.6 5.4  NEUTROABS 3.2 4.3 3.6  HGB 7.7* 7.7* 7.9*  HCT 23.0* 23.2* 23.4*  MCV 104.5* 105.0* 101.7*  PLT 66* 78* 65*    Lab Results  Component Value Date   TSH 3.43 05/24/2015   Lab Results  Component Value Date   HGBA1C 8.2* 05/24/2015   No results found for: CHOL, HDL, LDLCALC, LDLDIRECT, TRIG, CHOLHDL  Significant Diagnostic Results since last visit: none  Patient Care Team: Estill Dooms, MD as PCP - General (Internal Medicine) Emrys Mceachron X, NP as Nurse Practitioner (Nurse Practitioner)  Assessment/Plan Problem List Items Addressed This Visit    CHF (congestive heart failure) ([place]) - Primary (Chronic)    Minimal BLE edema has no change, continue Furosemide 40mg , Spironolactone 25mg        CKD (chronic kidney disease) (Chronic)    05/24/15 Bun/creat 47/1.53      Type 2 diabetes mellitus with diabetic chronic kidney disease ([place]) (Chronic)    05/24/15 Hgb A1c 8.2 Continue Lantus 54 qd Novolog 12u for CBG >450 instead of 5u with meals.      Dementia (Chronic)    Gradual  decline, off Namenda R>B in setting of bone marrow dysplasia . Continue SNF for care needs      Essential hypertension, benign (Chronic)    Controlled, continue Metoprolol 25mg  bid. off Losartan due to CKD. Creatinine 1.5-2.0  Hypothyroidism (Chronic)    Controlled, last TSH 3.433 05/24/15, continue Levothyroxine 80mcg daily.      Anemia in chronic kidney disease (Chronic)    F/u oncology, blood transfusion prn, no active bleeding.       MDS (myelodysplastic syndrome) ([facility]) (Chronic)    Continue f/u oncology, prn blood transfusion.      Duodenal ulcer    Stable, no change in tx,  no c/o stomach pain, indigestion, or melena.       Depression    Mood is stable, continue Cymbalta 60mg  daily      Acute bronchitis    Worsened chronic cough, continue  Prednisone 5mg  daily, Avelox 400mg  daily x 7 days.           Family/ staff Communication: continue to observe the patient.   Labs/tests ordered: X-ray left forearm/wrist, negative study.   [facility] Raylee Adamec NP Geriatrics [facility] [phone], [place] [address]   On Call:  [phone] & follow prompts after 5pm & weekends Office Phone:  [phone] Office Fax:  [phone]

## 2015-09-07 NOTE — Assessment & Plan Note (Signed)
Gradual decline, off Namenda R>B in setting of bone marrow dysplasia . Continue SNF for care needs 

## 2015-09-07 NOTE — Assessment & Plan Note (Signed)
05/24/15 Hgb A1c 8.2 Continue Lantus 54 qd Novolog 12u for CBG >450 instead of 5u with meals.

## 2015-09-07 NOTE — Assessment & Plan Note (Signed)
05/24/15 Bun/creat 47/1.53

## 2015-09-08 ENCOUNTER — Other Ambulatory Visit: Payer: Medicare Other

## 2015-09-08 ENCOUNTER — Ambulatory Visit (HOSPITAL_BASED_OUTPATIENT_CLINIC_OR_DEPARTMENT_OTHER): Payer: Medicare Other | Admitting: Hematology and Oncology

## 2015-09-08 ENCOUNTER — Ambulatory Visit (HOSPITAL_BASED_OUTPATIENT_CLINIC_OR_DEPARTMENT_OTHER): Payer: Medicare Other

## 2015-09-08 ENCOUNTER — Telehealth: Payer: Self-pay | Admitting: *Deleted

## 2015-09-08 ENCOUNTER — Other Ambulatory Visit (HOSPITAL_BASED_OUTPATIENT_CLINIC_OR_DEPARTMENT_OTHER): Payer: Medicare Other

## 2015-09-08 ENCOUNTER — Encounter: Payer: Self-pay | Admitting: Hematology and Oncology

## 2015-09-08 VITALS — BP 126/47 | HR 68 | Temp 98.3°F | Resp 20

## 2015-09-08 VITALS — BP 105/45 | HR 79 | Temp 98.4°F | Resp 17 | Ht 68.0 in

## 2015-09-08 DIAGNOSIS — N189 Chronic kidney disease, unspecified: Secondary | ICD-10-CM

## 2015-09-08 DIAGNOSIS — D469 Myelodysplastic syndrome, unspecified: Secondary | ICD-10-CM

## 2015-09-08 DIAGNOSIS — J209 Acute bronchitis, unspecified: Secondary | ICD-10-CM

## 2015-09-08 DIAGNOSIS — D631 Anemia in chronic kidney disease: Secondary | ICD-10-CM

## 2015-09-08 LAB — CBC & DIFF AND RETIC
BASO%: 0.1 % (ref 0.0–2.0)
Basophils Absolute: 0 10*3/uL (ref 0.0–0.1)
EOS ABS: 0.2 10*3/uL (ref 0.0–0.5)
EOS%: 2.1 % (ref 0.0–7.0)
HCT: 23.3 % — ABNORMAL LOW (ref 38.4–49.9)
HGB: 7.6 g/dL — ABNORMAL LOW (ref 13.0–17.1)
IMMATURE RETIC FRACT: 11.3 % — AB (ref 3.00–10.60)
LYMPH#: 1.7 10*3/uL (ref 0.9–3.3)
LYMPH%: 23.5 % (ref 14.0–49.0)
MCH: 30.2 pg (ref 27.2–33.4)
MCHC: 32.6 g/dL (ref 32.0–36.0)
MCV: 92.5 fL (ref 79.3–98.0)
MONO#: 0.4 10*3/uL (ref 0.1–0.9)
MONO%: 5.9 % (ref 0.0–14.0)
NEUT%: 68.4 % (ref 39.0–75.0)
NEUTROS ABS: 5 10*3/uL (ref 1.5–6.5)
PLATELETS: 86 10*3/uL — AB (ref 140–400)
RBC: 2.52 10*6/uL — AB (ref 4.20–5.82)
RDW: 25.8 % — ABNORMAL HIGH (ref 11.0–14.6)
RETIC %: 0.88 % (ref 0.80–1.80)
RETIC CT ABS: 22.18 10*3/uL — AB (ref 34.80–93.90)
WBC: 7.3 10*3/uL (ref 4.0–10.3)
nRBC: 1 % — ABNORMAL HIGH (ref 0–0)

## 2015-09-08 LAB — TECHNOLOGIST REVIEW

## 2015-09-08 LAB — PREPARE RBC (CROSSMATCH)

## 2015-09-08 MED ORDER — ACETAMINOPHEN 325 MG PO TABS
ORAL_TABLET | ORAL | Status: AC
  Filled 2015-09-08: qty 2

## 2015-09-08 MED ORDER — DIPHENHYDRAMINE HCL 25 MG PO CAPS
ORAL_CAPSULE | ORAL | Status: AC
  Filled 2015-09-08: qty 1

## 2015-09-08 MED ORDER — DIPHENHYDRAMINE HCL 25 MG PO CAPS
25.0000 mg | ORAL_CAPSULE | Freq: Once | ORAL | Status: AC
  Administered 2015-09-08: 25 mg via ORAL

## 2015-09-08 MED ORDER — SODIUM CHLORIDE 0.9 % IV SOLN
250.0000 mL | Freq: Once | INTRAVENOUS | Status: AC
  Administered 2015-09-08: 250 mL via INTRAVENOUS

## 2015-09-08 MED ORDER — ACETAMINOPHEN 325 MG PO TABS
650.0000 mg | ORAL_TABLET | Freq: Once | ORAL | Status: AC
  Administered 2015-09-08: 650 mg via ORAL

## 2015-09-08 NOTE — Progress Notes (Signed)
Coughing episode, dry cough and non-productrive. Oxygen saturation 97%. Wife has list of medications from [facility]. Took Lasix, Spironolactone and [facility]velox [facility]. Will continue to monitor, oxygen saturation monitor left in place.

## 2015-09-08 NOTE — Assessment & Plan Note (Signed)
The patient is not on any form of systemic treatment due to his age and comorbidities. Despite increasing the frequency of Aranesp injection he has lack of response to anemia and that was subsequently discontinued He is in agreement to come in for palliative blood transfusion only.  Over the last 6 months, we have established a pattern where by the patient with required 2 units of blood every 4 weeks to keep hemoglobin above 8 g  I have arranged for him to return here every 4 weeks for blood transfusion  Both the patient and his wife is in agreement with the plan of care

## 2015-09-08 NOTE — Progress Notes (Signed)
[facility] OFFICE PROGRESS NOTE  Patient Care Team: Estill Dooms, MD as PCP - General (Internal Medicine) Man Mast X, NP as Nurse Practitioner (Nurse Practitioner)  SUMMARY OF ONCOLOGIC HISTORY:  I reviewed the patient's records extensive and collaborated the history with the patient. Summary of his history is as follows: He was originally referred here for evaluation of chronic anemia and thrombocytopenia and evaluated by Dr Juliann Mule on 08/26/2013. He has dementia and provides limited history. Of note, he has Chronic kidney disease with a creatinine clearance of 30 ml/min and he resides at SNF at [facility]. As previously reported, his recent anemia work-up revealed a Hgb of 8.3 on 09/02/2012; Normal iron 87, B12 740, Folate > 20. He has not been on iron supplementation. His hgb on 08/13/13 revealed a hgb of 8.0. GI (Dr. Norberto Sorenson T. Dagoberto Ligas. MD) was consulted on 08/26/2013 for anemia. Last upper endoscopy on 12/13/2011 revealed mild gastritis with recommendations for a PPI q am long term. The patient's review of systems for symptoms of anemia was negative.Examining the plt trend revealed his thrombocytopenia started around 09/12/2011 following laparotomy by Dr. Lilyan Punt and repair of perforated ulcer. He has since been on nexium 40 mg bid. Of note he takes namenda for his dementia; lasix 20 mg daily for congestive heart failure; lantus for his diabetes. He is on folic acid daily. He had bone marrow aspirate and biopsy which confirmed myelodysplastic syndrome.   The patient is receiving erythropoietin stimulating agents for severe anemia. The frequency and dose of the injections were increased due to lack of response. He has also been receiving intermittent blood transfusion Ultimately, Aranesp was discontinued in February 2016 due to lack of response From 2016 onwards, the patient has been coming to the [facility] every 4 weeks to receive 2 units of blood transfusion, to keep  hemoglobin greater than 8  INTERVAL HISTORY: Please see below for problem oriented charting. He complained of fatigue. He had recent upper respiratory tract infection, currently being treated with antibiotic therapy. The patient denies any recent signs or symptoms of bleeding such as spontaneous epistaxis, hematuria or hematochezia. He complained of nasal congestion. No high-grade fevers or chills.  REVIEW OF SYSTEMS:   Constitutional: Denies fevers, chills or abnormal weight loss Eyes: Denies blurriness of vision Ears, nose, mouth, throat, and face: Denies mucositis or sore throat Respiratory: Denies cough, dyspnea or wheezes Cardiovascular: Denies palpitation, chest discomfort or lower extremity swelling Gastrointestinal:  Denies nausea, heartburn or change in bowel habits Skin: Denies abnormal skin rashes Lymphatics: Denies new lymphadenopathy or easy bruising Neurological:Denies numbness, tingling or new weaknesses Behavioral/Psych: Mood is stable, no new changes  All other systems were reviewed with the patient and are negative.  I have reviewed the past medical history, past surgical history, social history and family history with the patient and they are unchanged from previous note.  ALLERGIES:  is allergic to enalapril; metformin and related; and milk-related compounds.  MEDICATIONS:  Current Outpatient Prescriptions  Medication Sig Dispense Refill  . acetaminophen (TYLENOL) 325 MG tablet Take 650 mg by mouth every 4 (four) hours as needed (for pain).     . BENEPROTEIN PO Take by mouth. Take 2 scoops by mouth with beverage of choice twice daily    . DULoxetine (CYMBALTA) 60 MG capsule Take 60 mg by mouth daily.    . famotidine (PEPCID) 20 MG tablet Take 1 tablet by mouth at bedtime    . folic acid (FOLVITE) 1  MG tablet Take 1 mg by mouth daily.     . furosemide (LASIX) 40 MG tablet Take 40 mg by mouth daily.    Marland Kitchen guaiFENesin (ROBITUSSIN) 100 MG/5ML liquid Take 200 mg by  mouth. Give 71ml every 6 hours as needed for cough    . insulin aspart (NOVOLOG) 100 UNIT/ML injection CBG before meals and give 5 units For CBG>150 **If CBG >450 give 12 units instead of 5 units    . insulin glargine (LANTUS) 100 UNIT/ML injection Inject 54 Units into the skin at bedtime.    Marland Kitchen levothyroxine (SYNTHROID, LEVOTHROID) 75 MCG tablet Take 75 mcg by mouth daily.     . metoprolol tartrate (LOPRESSOR) 25 MG tablet Take 25 mg by mouth 2 (two) times daily.    . predniSONE (DELTASONE) 5 MG tablet Take 5 mg by mouth daily.    Marland Kitchen spironolactone (ALDACTONE) 25 MG tablet Take 25 mg by mouth daily.    . vitamin B-12 (CYANOCOBALAMIN) 1000 MCG tablet Take 1,000 mcg by mouth daily.    Marland Kitchen moxifloxacin (AVELOX) 400 MG tablet      No current facility-administered medications for this visit.    PHYSICAL EXAMINATION: ECOG PERFORMANCE STATUS: 2 - Symptomatic, <50% confined to bed  Filed Vitals:   09/08/15 0831  BP: 105/45  Pulse: 79  Temp: 98.4 F (36.9 C)  Resp: 17   There were no vitals filed for this visit.  GENERAL:alert, no distress and comfortable. He is examined sitting on the wheelchair. He appears obese, debilitated SKIN: skin color is pale, texture, turgor are normal, no rashes or significant lesions EYES: normal, Conjunctiva are pale and non-injected, sclera clear OROPHARYNX:no exudate, no erythema and lips, buccal mucosa, and tongue normal  NECK: supple, thyroid normal size, non-tender, without nodularity LYMPH:  no palpable lymphadenopathy in the cervical, axillary or inguinal LUNGS: clear to auscultation and percussion with normal breathing effort HEART: regular rate & rhythm and no murmurs with moderate bilateral lower extremity edema ABDOMEN:abdomen soft, non-tender and normal bowel sounds Musculoskeletal:no cyanosis of digits and no clubbing  NEURO: alert & oriented x 3 with fluent speech, no focal motor/sensory deficits  LABORATORY DATA:  I have reviewed the data as  listed    Component Value Date/Time   NA 134* 05/24/2015   NA 135* 09/22/2014 0905   NA 136 09/21/2011 0400   K 4.6 05/24/2015   K 5.6* 09/22/2014 0905   CL 99 09/21/2011 0400   CO2 25 09/22/2014 0905   CO2 30 09/21/2011 0400   GLUCOSE 210* 09/22/2014 0905   GLUCOSE 229* 09/21/2011 0400   BUN 47* 05/24/2015   BUN 34.4* 09/22/2014 0905   BUN 26* 09/21/2011 0400   CREATININE 1.5* 05/24/2015   CREATININE 1.7* 09/22/2014 0905   CREATININE 1.37* 09/21/2011 0400   CALCIUM 9.0 09/22/2014 0905   CALCIUM 7.8* 09/21/2011 0400   PROT 6.2* 09/22/2014 0905   PROT 5.4* 09/13/2011 0345   ALBUMIN 3.1* 09/22/2014 0905   ALBUMIN 2.2* 09/13/2011 0345   AST 13* 05/24/2015   AST 10 09/22/2014 0905   ALT 16 05/24/2015   ALT 11 09/22/2014 0905   ALKPHOS 79 05/24/2015   ALKPHOS 109 09/22/2014 0905   BILITOT 0.39 09/22/2014 0905   BILITOT 0.3 09/13/2011 0345   GFRNONAA 44* 09/21/2011 0400   GFRAA 51* 09/21/2011 0400    No results found for: SPEP, UPEP  Lab Results  Component Value Date   WBC 7.3 09/08/2015   NEUTROABS 5.0 09/08/2015   HGB  7.6* 09/08/2015   HCT 23.3* 09/08/2015   MCV 92.5 09/08/2015   PLT 86* 09/08/2015      Chemistry      Component Value Date/Time   NA 134* 05/24/2015   NA 135* 09/22/2014 0905   NA 136 09/21/2011 0400   K 4.6 05/24/2015   K 5.6* 09/22/2014 0905   CL 99 09/21/2011 0400   CO2 25 09/22/2014 0905   CO2 30 09/21/2011 0400   BUN 47* 05/24/2015   BUN 34.4* 09/22/2014 0905   BUN 26* 09/21/2011 0400   CREATININE 1.5* 05/24/2015   CREATININE 1.7* 09/22/2014 0905   CREATININE 1.37* 09/21/2011 0400   GLU 210 05/24/2015      Component Value Date/Time   CALCIUM 9.0 09/22/2014 0905   CALCIUM 7.8* 09/21/2011 0400   ALKPHOS 79 05/24/2015   ALKPHOS 109 09/22/2014 0905   AST 13* 05/24/2015   AST 10 09/22/2014 0905   ALT 16 05/24/2015   ALT 11 09/22/2014 0905   BILITOT 0.39 09/22/2014 0905   BILITOT 0.3 09/13/2011 0345      ASSESSMENT & PLAN:   MDS (myelodysplastic syndrome) ([place]) The patient is not on any form of systemic treatment due to his age and comorbidities. Despite increasing the frequency of Aranesp injection he has lack of response to anemia and that was subsequently discontinued He is in agreement to come in for palliative blood transfusion only.  Over the last 6 months, we have established a pattern where by the patient with required 2 units of blood every 4 weeks to keep hemoglobin above 8 g  I have arranged for him to return here every 4 weeks for blood transfusion  Both the patient and his wife is in agreement with the plan of care   Acute bronchitis He has persistent URI and was treated with antibiotic therapy. Continue the same. Examination today is benign   No orders of the defined types were placed in this encounter.   All questions were answered. The patient knows to call the clinic with any problems, questions or concerns. No barriers to learning was detected. I spent 15 minutes counseling the patient face to face. The total time spent in the appointment was 20 minutes and more than 50% was on counseling and review of test results     [facility], [place], MD 09/08/2015 9:13 AM

## 2015-09-08 NOTE — Telephone Encounter (Signed)
Per staff message and POF I have scheduled appts. Advised scheduler of appts. [name]  

## 2015-09-08 NOTE — Assessment & Plan Note (Signed)
He has persistent URI and was treated with antibiotic therapy. Continue the same. Examination today is benign

## 2015-09-08 NOTE — Patient Instructions (Signed)

## 2015-09-09 LAB — TYPE AND SCREEN
ABO/RH(D): O POS
Antibody Screen: NEGATIVE
UNIT DIVISION: 0
Unit division: 0

## 2015-09-26 ENCOUNTER — Non-Acute Institutional Stay (SKILLED_NURSING_FACILITY): Payer: Medicare Other | Admitting: Nurse Practitioner

## 2015-09-26 ENCOUNTER — Encounter: Payer: Self-pay | Admitting: Nurse Practitioner

## 2015-09-26 DIAGNOSIS — D469 Myelodysplastic syndrome, unspecified: Secondary | ICD-10-CM

## 2015-09-26 DIAGNOSIS — F329 Major depressive disorder, single episode, unspecified: Secondary | ICD-10-CM | POA: Diagnosis not present

## 2015-09-26 DIAGNOSIS — E039 Hypothyroidism, unspecified: Secondary | ICD-10-CM

## 2015-09-26 DIAGNOSIS — I5040 Unspecified combined systolic (congestive) and diastolic (congestive) heart failure: Secondary | ICD-10-CM | POA: Diagnosis not present

## 2015-09-26 DIAGNOSIS — D631 Anemia in chronic kidney disease: Secondary | ICD-10-CM

## 2015-09-26 DIAGNOSIS — N183 Chronic kidney disease, stage 3 (moderate): Secondary | ICD-10-CM

## 2015-09-26 DIAGNOSIS — N189 Chronic kidney disease, unspecified: Secondary | ICD-10-CM

## 2015-09-26 DIAGNOSIS — F0390 Unspecified dementia without behavioral disturbance: Secondary | ICD-10-CM

## 2015-09-26 DIAGNOSIS — E1122 Type 2 diabetes mellitus with diabetic chronic kidney disease: Secondary | ICD-10-CM | POA: Diagnosis not present

## 2015-09-26 DIAGNOSIS — K269 Duodenal ulcer, unspecified as acute or chronic, without hemorrhage or perforation: Secondary | ICD-10-CM

## 2015-09-26 DIAGNOSIS — L89319 Pressure ulcer of right buttock, unspecified stage: Secondary | ICD-10-CM | POA: Insufficient documentation

## 2015-09-26 DIAGNOSIS — L89312 Pressure ulcer of right buttock, stage 2: Secondary | ICD-10-CM | POA: Diagnosis not present

## 2015-09-26 DIAGNOSIS — F32A Depression, unspecified: Secondary | ICD-10-CM

## 2015-09-26 DIAGNOSIS — I10 Essential (primary) hypertension: Secondary | ICD-10-CM | POA: Diagnosis not present

## 2015-09-26 NOTE — Assessment & Plan Note (Signed)
F/u oncology, blood transfusion prn, no active bleeding.  

## 2015-09-26 NOTE — Assessment & Plan Note (Signed)
05/24/15 Bun/creat 47/1.53

## 2015-09-26 NOTE — Progress Notes (Signed)
Patient ID: [name] [name], male   DOB: [birth year], 82 y.o.   MRN: [id number]  Location:  [facility] Room Number: 28 Place of Service:  SNF (31) Provider: Lennie Odor Mast NP  GREEN, [name], MD  Patient Care Team: Estill Dooms, MD as PCP - General (Internal Medicine) Man Mast X, NP as Nurse Practitioner (Nurse Practitioner)  Extended Emergency Contact Information Primary Emergency Contact: Renwick,Dolores Address: [address]          [place], [place] [address] [place] Phone: [phone] Mobile Phone: [phone] Relation: Spouse  Code Status: DNR Goals of care: Advanced Directive information Advanced Directives 09/26/2015  Does patient have an advance directive? Yes  Type of Advance Directive Living will;Out of facility DNR (pink MOST or yellow form)  Does patient want to make changes to advanced directive? No - Patient declined  Copy of advanced directive(s) in chart? Yes     Chief Complaint  Patient presents with  . Open Wound    right buttocks small opening around area red and pain to touch    HPI:  Pt is a 82 y.o. male seen today for an acute visit for small open wound right buttock, no s/s of infection, pressure injury     Hx of dementia, PUD, anemia, T2DM with blood sugar controlled on Lantus , hypertension on Metoprolol, chronic edema is managed with Furosemide 40mg  and Spironolactone 25mg  depression on Cymbalta 60mg , and hypothyroidism on Levothyroxine 7mcg.   Past Medical History  Diagnosis Date  . Hypothyroidism   . Hypertension   . CHF (congestive heart failure) ([place])   . Diabetes mellitus   . Coronary artery disease   . Restless leg syndrome   . Hyperlipemia   . Renal disease     stage II  . Sleep apnea   . Cellulitis and abscess of leg   . Obesity   . Duodenal ulcer, perforated ([place])   . Diverticulosis   . Internal hemorrhoids   . CAD (coronary artery disease)   . Anemia   . Duodenal  ulcer   . Allergy   . Blood transfusion   . Cataract   . Tuberculosis     as a child  . Contact dermatitis and other eczema, due to unspecified cause 10/06/2012  . Senile dementia with depressive features 05/26/2012  . Hyposmolality and/or hyponatremia 04/02/2012  . Diarrhea 03/26/2012  . Hemorrhage of rectum and anus 03/25/2012  . Pneumonia, organism unspecified 03/12/2012  . 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 Folate-deficiency anemia 01/07/2012  . Thrombocytopenia, unspecified ([place]) 09/26/2011  . Loss of weight 09/11/2011  . Dysphagia, unspecified(787.20) 06/28/2011  . Chronic kidney disease, stage III (moderate) 03/22/2011  . Cramp of limb 08/10/2010  . Chronic kidney disease, stage II (mild) 03/22/2011  . Personal history of fall 05/05/2009  . Unspecified vitamin D deficiency 02/24/2009  . Blood in stool 11/18/2008  . Dermatophytosis of the body 06/23/2007  . Obesity, unspecified 06/23/2007  . Unspecified tinnitus 06/23/2007  . Edema 06/23/2007  . Shortness of breath 06/23/2007  . Flatulence, eructation, and gas pain 06/23/2007  . Other general symptoms(780.99) 06/23/2007  . Memory loss 08/13/2003  . Screening PSA (prostate specific antigen) 07/27/2014  . Prostate cancer screening 07/27/2014  . Weight loss 07/27/2014   Past Surgical History  Procedure Laterality Date  . Laparotomy  09/12/2011    Procedure: EXPLORATORY LAPAROTOMY;  Surgeon: Judieth Keens, DO;  Location:  [facility] ORS;  Service: General;  Laterality: N/A;  REPAIR PERFORATED ULCER  . Gastrostomy  09/12/2011    Procedure: GASTROSTOMY;  Surgeon: Judieth Keens, DO;  Location: [facility];  Service: General;  Laterality: N/A;  BIOPSY DUODENAL ULCER  . Shoulder surgery  [place], [place]  . Jejunostomy feeding tube      removed  . Back surgery  1942  . Eye surgery  [phone]    cataracts  . Coronary artery bypass graft  2201    Allergies  Allergen Reactions  .  Enalapril Other (See Comments)    cough  . Metformin And Related Diarrhea  . Milk-Related Compounds Other (See Comments)    Doesn't remember but its a mild reaction      Medication List       This list is accurate as of: 09/26/15 12:31 PM.  Always use your most recent med list.               acetaminophen 325 MG tablet  Commonly known as:  TYLENOL  Take 650 mg by mouth every 4 (four) hours as needed (for pain).     BENEPROTEIN PO  Take by mouth. Take 2 scoops by mouth with beverage of choice twice daily     DULoxetine 60 MG capsule  Commonly known as:  CYMBALTA  Take 60 mg by mouth daily.     famotidine 20 MG tablet  Commonly known as:  PEPCID  Take 1 tablet by mouth at bedtime     folic acid 1 MG tablet  Commonly known as:  FOLVITE  Take 1 mg by mouth daily.     furosemide 40 MG tablet  Commonly known as:  LASIX  Take 40 mg by mouth daily.     guaiFENesin 100 MG/5ML liquid  Commonly known as:  ROBITUSSIN  Take 200 mg by mouth. Give 57ml every 6 hours as needed for cough     insulin aspart 100 UNIT/ML injection  Commonly known as:  novoLOG  CBG before meals and give 5 units For CBG>150 **If CBG >450 give 12 units instead of 5 units     insulin glargine 100 UNIT/ML injection  Commonly known as:  LANTUS  Inject 54 Units into the skin at bedtime.     levothyroxine 75 MCG tablet  Commonly known as:  SYNTHROID, LEVOTHROID  Take 75 mcg by mouth daily.     metoprolol tartrate 25 MG tablet  Commonly known as:  LOPRESSOR  Take 25 mg by mouth 2 (two) times daily.     predniSONE 5 MG tablet  Commonly known as:  DELTASONE  Take 5 mg by mouth daily.     spironolactone 25 MG tablet  Commonly known as:  ALDACTONE  Take 25 mg by mouth daily.     vitamin B-12 1000 MCG tablet  Commonly known as:  CYANOCOBALAMIN  Take 1,000 mcg by mouth daily.        Review of Systems  Constitutional: Negative for fever and chills.  HENT: Positive for hearing loss. Negative  for congestion, ear discharge and ear pain.   Eyes: Negative for pain, discharge and redness.  Respiratory: Positive for cough. Negative for shortness of breath and wheezing.   Cardiovascular: Positive for leg swelling. Negative for chest pain and palpitations.       Edema 1+ BLE  Gastrointestinal: Negative for nausea, vomiting, abdominal pain and constipation.  Genitourinary: Positive for frequency. Negative for dysuria and urgency.  Musculoskeletal:  Negative for myalgias, back pain and neck pain.       Left forearm pain, X-ray 09/04/14 negative study.   Skin: Negative for rash.       A small open wound at the right buttock, no s/s of infection, pressure wound in nature.   Neurological: Negative for dizziness, tremors, seizures, weakness and headaches.  Hematological: Does not bruise/bleed easily.  Psychiatric/Behavioral: Negative for suicidal ideas and hallucinations. The patient is nervous/anxious.     Immunization History  Administered Date(s) Administered  . Influenza Whole 07/30/2006  . Influenza-Unspecified 06/02/2014, 04/20/2015  . PPD Test 10/06/2011  . Td 07/30/2006   Pertinent  Health Maintenance Due  Topic Date Due  . FOOT EXAM  07/26/1932  . OPHTHALMOLOGY EXAM  07/26/1932  . URINE MICROALBUMIN  07/26/1932  . PNA vac Low Risk Adult (1 of 2 - PCV13) 07/27/1987  . HEMOGLOBIN A1C  11/22/2015  . INFLUENZA VACCINE  02/28/2016   Fall Risk  06/16/2014 04/07/2014 01/13/2014  Falls in the past year? Yes - No  Number falls in past yr: 2 or more - -  Risk Factor Category  High Fall Risk - -  Risk for fall due to : History of fall(s);Impaired balance/gait;Impaired mobility Impaired mobility Impaired mobility  Risk for fall due to (comments): - fall band on -   Functional Status Survey:    Filed Vitals:   09/26/15 0933  BP: 130/82  Pulse: 72  Temp: 98 F (36.7 C)  TempSrc: Oral  Resp: 20  Height: 5\' 8"  (1.727 m)  Weight: 223 lb 6.4 oz (101.334 kg)   Body mass index is  33.98 kg/(m^2). Physical Exam  Constitutional: He is oriented to person, place, and time. He appears well-developed and well-nourished. No distress.  HENT:  Head: Normocephalic and atraumatic.  Right Ear: External ear normal.  Left Ear: External ear normal.  Nose: Nose normal.  Mouth/Throat: Oropharynx is clear and moist. No oropharyngeal exudate.  Eyes: Conjunctivae and EOM are normal. Pupils are equal, round, and reactive to light.  Neck: Normal range of motion. Neck supple. No JVD present. No thyromegaly present.  Cardiovascular: Normal rate, regular rhythm and normal heart sounds.   No murmur heard. Pulmonary/Chest: Effort normal. No respiratory distress. He has no wheezes. He has rales in the right lower field and the left lower field. He exhibits no tenderness.  Abdominal: Soft. Bowel sounds are normal. He exhibits no distension and no mass. There is no tenderness. There is no rebound.  Musculoskeletal: Normal range of motion. He exhibits edema (trace in BLE). He exhibits no tenderness.  1+ edema BLE  Lymphadenopathy:    He has no cervical adenopathy.  Neurological: He is alert and oriented to person, place, and time. He has normal reflexes. No cranial nerve deficit. Coordination normal.  Skin: Skin is warm and dry. No rash noted. He is not diaphoretic. No erythema.  A small open wound at the right buttock, no s/s of infection, pressure wound in nature.   Psychiatric: Thought content normal. His mood appears anxious. His affect is angry and inappropriate. His affect is not blunt and not labile. His speech is not delayed, not tangential and not slurred. He is agitated and aggressive. He is not hyperactive, not slowed, not withdrawn, not actively hallucinating and not combative. Cognition and memory are impaired. He exhibits a depressed mood. He is communicative. He exhibits abnormal recent memory. He is attentive.    Labs reviewed:  Recent Labs  05/24/15  NA 134*  K 4.6  BUN 47*    CREATININE 1.5*    Recent Labs  05/24/15  AST 13*  ALT 16  ALKPHOS 79    Recent Labs  07/14/15 0804 08/11/15 0755 09/08/15 0813  WBC 5.6 5.4 7.3  NEUTROABS 4.3 3.6 5.0  HGB 7.7* 7.9* 7.6*  HCT 23.2* 23.4* 23.3*  MCV 105.0* 101.7* 92.5  PLT 78* 65* 86*   Lab Results  Component Value Date   TSH 3.43 05/24/2015   Lab Results  Component Value Date   HGBA1C 8.2* 05/24/2015   No results found for: CHOL, HDL, LDLCALC, LDLDIRECT, TRIG, CHOLHDL  Significant Diagnostic Results in last 30 days:  No results found.  Assessment/Plan: CHF (congestive heart failure) ([facility]) Minimal BLE edema has no change, continue Furosemide 40mg , Spironolactone 25mg    CKD (chronic kidney disease) 05/24/15 Bun/creat 47/1.53  Type 2 diabetes mellitus with diabetic chronic kidney disease ([place]) 05/24/15 Hgb A1c 8.2 Continue Lantus 54 qd Novolog 12u for CBG >450 instead of 5u with meals.  Dementia Gradual decline, off Namenda R>B in setting of bone marrow dysplasia . Continue SNF for care needs  Essential hypertension, benign Controlled, continue Metoprolol 25mg  bid. off Losartan due to CKD. Creatinine 1.5-2.0  Hypothyroidism Controlled, last TSH 3.433 05/24/15, continue Levothyroxine 35mcg daily.  Anemia in chronic kidney disease F/u oncology, blood transfusion prn, no active bleeding.   MDS (myelodysplastic syndrome) ([place]) Continue f/u oncology, prn blood transfusion.  Duodenal ulcer Stable, no change in tx,  no c/o stomach pain, indigestion, or melena.   Depression Mood is stable, continue Cymbalta 60mg  daily  Pressure ulcer of right buttock Pressure reduction is must, bedrest between meals until the wound is healed.      Family/ staff Communication: continue SNF for care needs.   Labs/tests ordered: none

## 2015-09-26 NOTE — Assessment & Plan Note (Signed)
Mood is stable, continue Cymbalta 60mg daily 

## 2015-09-26 NOTE — Assessment & Plan Note (Signed)
Minimal BLE edema has no change, continue Furosemide 40mg , Spironolactone 25mg 

## 2015-09-26 NOTE — Assessment & Plan Note (Signed)
Controlled, continue Metoprolol 25mg bid. off Losartan due to CKD. Creatinine 1.5-2.0 

## 2015-09-26 NOTE — Assessment & Plan Note (Signed)
Continue f/u oncology, prn blood transfusion.  

## 2015-09-26 NOTE — Assessment & Plan Note (Signed)
Gradual decline, off Namenda R>B in setting of bone marrow dysplasia . Continue SNF for care needs 

## 2015-09-26 NOTE — Assessment & Plan Note (Signed)
Controlled, last TSH 3.433 05/24/15, continue Levothyroxine 75mcg daily.  

## 2015-09-26 NOTE — Assessment & Plan Note (Signed)
05/24/15 Hgb A1c 8.2 Continue Lantus 54 qd Novolog 12u for CBG >450 instead of 5u with meals.

## 2015-09-26 NOTE — Assessment & Plan Note (Signed)
Pressure reduction is must, bedrest between meals until the wound is healed.

## 2015-09-26 NOTE — Assessment & Plan Note (Signed)
Stable, no change in tx,  no c/o stomach pain, indigestion, or melena.   

## 2015-09-28 ENCOUNTER — Ambulatory Visit (HOSPITAL_COMMUNITY)
Admission: RE | Admit: 2015-09-28 | Discharge: 2015-09-28 | Disposition: A | Discharge status: Home / Self Care | Payer: Medicare Other | Source: Ambulatory Visit | Attending: Hematology | Admitting: Hematology

## 2015-09-28 DIAGNOSIS — D469 Myelodysplastic syndrome, unspecified: Secondary | ICD-10-CM | POA: Insufficient documentation

## 2015-10-03 ENCOUNTER — Telehealth: Payer: Self-pay | Admitting: *Deleted

## 2015-10-03 NOTE — Telephone Encounter (Signed)
PLs ask maggie/michelle

## 2015-10-03 NOTE — Telephone Encounter (Signed)
Wife called to see if [name] [name] can be in a bed for his transfusion on Thursday. States he has 2 bedsores and will need to have Depends changed while he is here.

## 2015-10-06 ENCOUNTER — Other Ambulatory Visit (HOSPITAL_BASED_OUTPATIENT_CLINIC_OR_DEPARTMENT_OTHER): Payer: Medicare Other

## 2015-10-06 ENCOUNTER — Ambulatory Visit (HOSPITAL_BASED_OUTPATIENT_CLINIC_OR_DEPARTMENT_OTHER): Payer: Medicare Other

## 2015-10-06 ENCOUNTER — Other Ambulatory Visit: Payer: Self-pay | Admitting: Hematology and Oncology

## 2015-10-06 VITALS — BP 107/51 | HR 68 | Temp 97.5°F | Resp 16

## 2015-10-06 DIAGNOSIS — D469 Myelodysplastic syndrome, unspecified: Secondary | ICD-10-CM

## 2015-10-06 DIAGNOSIS — D631 Anemia in chronic kidney disease: Secondary | ICD-10-CM | POA: Diagnosis not present

## 2015-10-06 DIAGNOSIS — N189 Chronic kidney disease, unspecified: Secondary | ICD-10-CM

## 2015-10-06 LAB — CBC & DIFF AND RETIC
BASO%: 0 % (ref 0.0–2.0)
BASOS ABS: 0 10*3/uL (ref 0.0–0.1)
EOS%: 1.4 % (ref 0.0–7.0)
Eosinophils Absolute: 0.1 10*3/uL (ref 0.0–0.5)
HEMATOCRIT: 21.5 % — AB (ref 38.4–49.9)
HEMOGLOBIN: 7.1 g/dL — AB (ref 13.0–17.1)
IMMATURE RETIC FRACT: 16.3 % — AB (ref 3.00–10.60)
LYMPH%: 19.4 % (ref 14.0–49.0)
MCH: 30.2 pg (ref 27.2–33.4)
MCHC: 33 g/dL (ref 32.0–36.0)
MCV: 91.5 fL (ref 79.3–98.0)
MONO#: 0.4 10*3/uL (ref 0.1–0.9)
MONO%: 5.8 % (ref 0.0–14.0)
NEUT#: 4.6 10*3/uL (ref 1.5–6.5)
NEUT%: 73.4 % (ref 39.0–75.0)
Platelets: 90 10*3/uL — ABNORMAL LOW (ref 140–400)
RBC: 2.35 10*6/uL — ABNORMAL LOW (ref 4.20–5.82)
RDW: 24 % — ABNORMAL HIGH (ref 11.0–14.6)
Retic %: 1.12 % (ref 0.80–1.80)
Retic Ct Abs: 26.32 10*3/uL — ABNORMAL LOW (ref 34.80–93.90)
WBC: 6.3 10*3/uL (ref 4.0–10.3)
lymph#: 1.2 10*3/uL (ref 0.9–3.3)
nRBC: 0 % (ref 0–0)

## 2015-10-06 LAB — PREPARE RBC (CROSSMATCH)

## 2015-10-06 LAB — TECHNOLOGIST REVIEW

## 2015-10-06 MED ORDER — ACETAMINOPHEN 325 MG PO TABS
ORAL_TABLET | ORAL | Status: AC
  Filled 2015-10-06: qty 2

## 2015-10-06 MED ORDER — ACETAMINOPHEN 325 MG PO TABS
650.0000 mg | ORAL_TABLET | Freq: Once | ORAL | Status: AC
  Administered 2015-10-06: 650 mg via ORAL

## 2015-10-06 MED ORDER — DIPHENHYDRAMINE HCL 25 MG PO CAPS
ORAL_CAPSULE | ORAL | Status: AC
Start: 2015-10-06 — End: 2015-10-06
  Filled 2015-10-06: qty 1

## 2015-10-06 MED ORDER — DIPHENHYDRAMINE HCL 25 MG PO CAPS
25.0000 mg | ORAL_CAPSULE | Freq: Once | ORAL | Status: AC
  Administered 2015-10-06: 25 mg via ORAL

## 2015-10-06 NOTE — Patient Instructions (Signed)

## 2015-10-07 ENCOUNTER — Encounter: Payer: Self-pay | Admitting: *Deleted

## 2015-10-07 LAB — TYPE AND SCREEN
ABO/RH(D): O POS
Antibody Screen: NEGATIVE
UNIT DIVISION: 0
UNIT DIVISION: 0

## 2015-10-21 ENCOUNTER — Encounter: Payer: Self-pay | Admitting: Nurse Practitioner

## 2015-10-21 ENCOUNTER — Non-Acute Institutional Stay (SKILLED_NURSING_FACILITY): Payer: Medicare Other | Admitting: Nurse Practitioner

## 2015-10-21 DIAGNOSIS — I5020 Unspecified systolic (congestive) heart failure: Secondary | ICD-10-CM

## 2015-10-21 DIAGNOSIS — J209 Acute bronchitis, unspecified: Secondary | ICD-10-CM

## 2015-10-21 DIAGNOSIS — F0390 Unspecified dementia without behavioral disturbance: Secondary | ICD-10-CM | POA: Diagnosis not present

## 2015-10-21 DIAGNOSIS — I10 Essential (primary) hypertension: Secondary | ICD-10-CM | POA: Diagnosis not present

## 2015-10-21 DIAGNOSIS — F329 Major depressive disorder, single episode, unspecified: Secondary | ICD-10-CM

## 2015-10-21 DIAGNOSIS — K269 Duodenal ulcer, unspecified as acute or chronic, without hemorrhage or perforation: Secondary | ICD-10-CM | POA: Diagnosis not present

## 2015-10-21 DIAGNOSIS — L89319 Pressure ulcer of right buttock, unspecified stage: Secondary | ICD-10-CM

## 2015-10-21 DIAGNOSIS — E1122 Type 2 diabetes mellitus with diabetic chronic kidney disease: Secondary | ICD-10-CM

## 2015-10-21 DIAGNOSIS — F32A Depression, unspecified: Secondary | ICD-10-CM

## 2015-10-21 DIAGNOSIS — D631 Anemia in chronic kidney disease: Secondary | ICD-10-CM

## 2015-10-21 DIAGNOSIS — N189 Chronic kidney disease, unspecified: Secondary | ICD-10-CM | POA: Diagnosis not present

## 2015-10-21 DIAGNOSIS — D469 Myelodysplastic syndrome, unspecified: Secondary | ICD-10-CM

## 2015-10-21 DIAGNOSIS — N390 Urinary tract infection, site not specified: Secondary | ICD-10-CM | POA: Diagnosis not present

## 2015-10-21 DIAGNOSIS — E039 Hypothyroidism, unspecified: Secondary | ICD-10-CM | POA: Diagnosis not present

## 2015-10-21 DIAGNOSIS — N183 Chronic kidney disease, stage 3 (moderate): Secondary | ICD-10-CM

## 2015-10-21 NOTE — Assessment & Plan Note (Signed)
Stable, no change in tx,  no c/o stomach pain, indigestion, or melena.   

## 2015-10-21 NOTE — Assessment & Plan Note (Signed)
Healed

## 2015-10-21 NOTE — Assessment & Plan Note (Addendum)
F/u oncology, blood transfusion prn, no active bleeding. Update CBC

## 2015-10-21 NOTE — Assessment & Plan Note (Signed)
Minimal BLE edema has no change, continue Furosemide 40mg , Spironolactone 25mg , update CMP

## 2015-10-21 NOTE — Assessment & Plan Note (Signed)
Continue f/u oncology, prn blood transfusion.  

## 2015-10-21 NOTE — Assessment & Plan Note (Signed)
Controlled, continue Metoprolol 25mg bid. off Losartan due to CKD. Creatinine 1.5-2.0 

## 2015-10-21 NOTE — Progress Notes (Signed)
Patient ID: [name] [name], male   DOB: 1919-12-18, 82 y.o.   MRN: [id number]  Location:  [place] Room Number: 43 Place of Service:  SNF (31) Provider: Lennie Odor Mast NP  [name], [name], MD  Patient Care Team: [name] Dooms, MD as PCP - General (Internal Medicine) [name] [name] X, NP as Nurse Practitioner (Nurse Practitioner)  Extended Emergency Contact Information Primary Emergency Contact: [name] [name],[name] [name] Address: [address]          [place], [place] [address] [place] Phone: [phone] Mobile Phone: [phone] Relation: Spouse  Code Status:  DNR Goals of care: Advanced Directive information Advanced Directives 10/21/2015  Does patient have an advance directive? Yes  Type of Advance Directive Living will;Out of facility DNR (pink MOST or yellow form)  Does patient want to make changes to advanced directive? No - Patient declined  Copy of advanced directive(s) in chart? Yes     Chief Complaint  Patient presents with  . Medical Management of Chronic Issues    Routine Visit  . Acute Visit    UTI    HPI:  Pt is a 82 y.o. male seen today for medical management of chronic diseases.  Hx of dementia, PUD, anemia, T2DM with blood sugar controlled on Lantus , hypertension on Metoprolol, chronic edema is managed with Furosemide 40mg  and Spironolactone 25mg  depression on Cymbalta 60mg , and hypothyroidism on Levothyroxine 80mcg.    10/17/15 urine culture E. Coli >100,000c/ml, Nitrofurantoin 100mg  bid [name] 10days started 10/17/15   Past Medical History  Diagnosis Date  . Hypothyroidism   . Hypertension   . CHF (congestive heart failure) ([place])   . Diabetes mellitus   . Coronary artery disease   . Restless leg syndrome   . Hyperlipemia   . Renal disease     stage II  . Sleep apnea   . Cellulitis and abscess of leg   . Obesity   . Duodenal ulcer, perforated ([place])   . Diverticulosis   . Internal hemorrhoids   . CAD  (coronary artery disease)   . Anemia   . Duodenal ulcer   . Allergy   . Blood transfusion   . Cataract   . Tuberculosis     as a child  . Contact dermatitis and other eczema, due to unspecified cause 10/06/2012  . Senile dementia with depressive features 05/26/2012  . Hyposmolality and/or hyponatremia 04/02/2012  . Diarrhea 03/26/2012  . Hemorrhage of rectum and anus 03/25/2012  . Pneumonia, organism unspecified 03/12/2012  . 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 Folate-deficiency anemia 01/07/2012  . Thrombocytopenia, unspecified ([place]) 09/26/2011  . Loss of weight 09/11/2011  . Dysphagia, unspecified(787.20) 06/28/2011  . Chronic kidney disease, stage III (moderate) 03/22/2011  . Cramp of limb 08/10/2010  . Chronic kidney disease, stage II (mild) 03/22/2011  . Personal history of fall 05/05/2009  . Unspecified vitamin D deficiency 02/24/2009  . Blood in stool 11/18/2008  . Dermatophytosis of the body 06/23/2007  . Obesity, unspecified 06/23/2007  . Unspecified tinnitus 06/23/2007  . Edema 06/23/2007  . Shortness of breath 06/23/2007  . Flatulence, eructation, and gas pain 06/23/2007  . Other general symptoms(780.99) 06/23/2007  . Memory loss 08/13/2003  . Screening PSA (prostate specific antigen) 07/27/2014  . Prostate cancer screening 07/27/2014  . Weight loss 07/27/2014   Past Surgical History  Procedure Laterality Date  . Laparotomy  09/12/2011    Procedure: EXPLORATORY LAPAROTOMY;  Surgeon:  Judieth Keens, DO;  Location: [facility];  Service: General;  Laterality: N/A;  REPAIR PERFORATED ULCER  . Gastrostomy  09/12/2011    Procedure: GASTROSTOMY;  Surgeon: Judieth Keens, DO;  Location: [facility];  Service: General;  Laterality: N/A;  BIOPSY DUODENAL ULCER  . Shoulder surgery  [place], [place]  . Jejunostomy feeding tube      removed  . Back surgery  1942  . Eye surgery  [phone]    cataracts  . Coronary artery bypass graft   2201    Allergies  Allergen Reactions  . Enalapril Other (See Comments)    cough  . Metformin And Related Diarrhea  . Milk-Related Compounds Other (See Comments)    Doesn't remember but its a mild reaction      Medication List       This list is accurate as of: 10/21/15  4:53 PM.  Always use your most recent med list.               acetaminophen 325 MG tablet  Commonly known as:  TYLENOL  Take 650 mg by mouth every 4 (four) hours as needed (for pain).     BENEPROTEIN PO  Take by mouth. Take 2 scoops by mouth with beverage of choice twice daily     DULoxetine 60 MG capsule  Commonly known as:  CYMBALTA  Take 60 mg by mouth daily.     famotidine 20 MG tablet  Commonly known as:  PEPCID  Take 1 tablet by mouth at bedtime     FLORASTOR 250 MG capsule  Generic drug:  saccharomyces boulardii  Take 250 mg by mouth 2 (two) times daily.     folic acid 1 MG tablet  Commonly known as:  FOLVITE  Take 1 mg by mouth daily.     furosemide 40 MG tablet  Commonly known as:  LASIX  Take 40 mg by mouth daily.     guaiFENesin 100 MG/5ML liquid  Commonly known as:  ROBITUSSIN  Take 200 mg by mouth. Give 87ml every 6 hours as needed for cough     insulin aspart 100 UNIT/ML injection  Commonly known as:  novoLOG  CBG before meals and give 5 units For CBG>150 **If CBG >450 give 12 units instead of 5 units     insulin glargine 100 UNIT/ML injection  Commonly known as:  LANTUS  Inject 54 Units into the skin at bedtime.     levothyroxine 75 MCG tablet  Commonly known as:  SYNTHROID, LEVOTHROID  Take 75 mcg by mouth daily.     metoprolol tartrate 25 MG tablet  Commonly known as:  LOPRESSOR  Take 25 mg by mouth 2 (two) times daily.     predniSONE 5 MG tablet  Commonly known as:  DELTASONE  Take 5 mg by mouth daily.     spironolactone 25 MG tablet  Commonly known as:  ALDACTONE  Take 25 mg by mouth daily.     vitamin B-12 1000 MCG tablet  Commonly known as:   CYANOCOBALAMIN  Take 1,000 mcg by mouth daily.        Review of Systems  Constitutional: Negative for fever and chills.  HENT: Positive for hearing loss. Negative for congestion, ear discharge and ear pain.   Eyes: Negative for pain, discharge and redness.  Respiratory: Positive for cough. Negative for shortness of breath and wheezing.   Cardiovascular: Positive for leg swelling. Negative for chest pain and palpitations.  Edema 1+ BLE  Gastrointestinal: Negative for nausea, vomiting, abdominal pain and constipation.  Genitourinary: Positive for frequency. Negative for dysuria and urgency.  Musculoskeletal: Negative for myalgias, back pain and neck pain.       Left forearm pain, [name]-ray 09/04/14 negative study.   Skin: Negative for rash.       A small open wound at the right buttock, healed  Neurological: Negative for dizziness, tremors, seizures, weakness and headaches.  Hematological: Does not bruise/bleed easily.  Psychiatric/Behavioral: Negative for suicidal ideas and hallucinations. The patient is nervous/anxious.     Immunization History  Administered Date(s) Administered  . Influenza Whole 07/30/2006  . Influenza-Unspecified 06/02/2014, 04/20/2015  . PPD Test 10/06/2011  . Td 07/30/2006   Pertinent  Health Maintenance Due  Topic Date Due  . FOOT EXAM  07/26/1932  . OPHTHALMOLOGY EXAM  07/26/1932  . URINE MICROALBUMIN  07/26/1932  . PNA vac Low Risk Adult (1 of 2 - PCV13) 07/27/1987  . HEMOGLOBIN A1C  11/22/2015  . INFLUENZA VACCINE  02/28/2016   Fall Risk  06/16/2014 04/07/2014 01/13/2014  Falls in the past year? Yes - No  Number falls in past yr: 2 or more - -  Risk Factor Category  High Fall Risk - -  Risk for fall due to : History of fall(s);Impaired balance/gait;Impaired mobility Impaired mobility Impaired mobility  Risk for fall due to (comments): - fall band on -   Functional Status Survey:    Filed Vitals:   10/21/15 0935  BP: 120/84  Pulse: 78  Temp:  98.5 F (36.9 C)  TempSrc: Oral  Resp: 19  Height: 5' 8.5" (1.74 m)  Weight: 219 lb 1.6 oz (99.383 kg)  SpO2: 96%   Body mass index is 32.83 kg/(m^2). Physical Exam  Constitutional: He is oriented to person, place, and time. He appears well-developed and well-nourished. No distress.  HENT:  Head: Normocephalic and atraumatic.  Right Ear: External ear normal.  Left Ear: External ear normal.  Nose: Nose normal.  Mouth/Throat: Oropharynx is clear and moist. No oropharyngeal exudate.  Eyes: Conjunctivae and EOM are normal. Pupils are equal, round, and reactive to light.  Neck: Normal range of motion. Neck supple. No JVD present. No thyromegaly present.  Cardiovascular: Normal rate, regular rhythm and normal heart sounds.   No murmur heard. Pulmonary/Chest: Effort normal. No respiratory distress. He has no wheezes. He has rales in the right lower field and the left lower field. He exhibits no tenderness.  Abdominal: Soft. Bowel sounds are normal. He exhibits no distension and no mass. There is no tenderness. There is no rebound.  Musculoskeletal: Normal range of motion. He exhibits edema (trace in BLE). He exhibits no tenderness.  1+ edema BLE  Lymphadenopathy:    He has no cervical adenopathy.  Neurological: He is alert and oriented to person, place, and time. He has normal reflexes. No cranial nerve deficit. Coordination normal.  Skin: Skin is warm and dry. No rash noted. He is not diaphoretic. No erythema.  A small open wound at the right buttock, healed.   Psychiatric: Thought content normal. His mood appears anxious. His affect is angry and inappropriate. His affect is not blunt and not labile. His speech is not delayed, not tangential and not slurred. He is agitated and aggressive. He is not hyperactive, not slowed, not withdrawn, not actively hallucinating and not combative. Cognition and memory are impaired. He exhibits a depressed mood. He is communicative. He exhibits abnormal  recent memory. He is attentive.  Labs reviewed:  Recent Labs  05/24/15  NA 134*  K 4.6  BUN 47*  CREATININE 1.5*    Recent Labs  05/24/15  AST 13*  ALT 16  ALKPHOS 79    Recent Labs  08/11/15 0755 09/08/15 0813 10/06/15 0802  WBC 5.4 7.3 6.3  NEUTROABS 3.6 5.0 4.6  HGB 7.9* 7.6* 7.1*  HCT 23.4* 23.3* 21.5*  MCV 101.7* 92.5 91.5  PLT 65* 86* 90*   Lab Results  Component Value Date   TSH 3.43 05/24/2015   Lab Results  Component Value Date   HGBA1C 8.2* 05/24/2015   No results found for: CHOL, HDL, LDLCALC, LDLDIRECT, TRIG, CHOLHDL  Significant Diagnostic Results in last 30 days:  No results found.  Assessment/Plan  Acute bronchitis Baseline chronic cough, continue  Prednisone 5mg  daily, completed Avelox 400mg  daily [name] 7 days.   Anemia in chronic kidney disease F/u oncology, blood transfusion prn, no active bleeding. Update CBC  CHF (congestive heart failure) ([facility]) Minimal BLE edema has no change, continue Furosemide 40mg , Spironolactone 25mg , update CMP  CKD (chronic kidney disease) 05/24/15 Bun/creat 47/1.53, update CMP  Dementia Gradual decline, off Namenda R>B in setting of bone marrow dysplasia . Continue SNF for care needs  Depression Mood is stable, continue Cymbalta 60mg  daily  Duodenal ulcer Stable, no change in tx,  no c/o stomach pain, indigestion, or melena  Essential hypertension, benign Controlled, continue Metoprolol 25mg  bid. off Losartan due to CKD. Creatinine 1.5-2.0  Hypothyroidism Controlled, last TSH 3.433 05/24/15, continue Levothyroxine 48mcg daily. Update TSH  MDS (myelodysplastic syndrome) ([place]) Continue f/u oncology, prn blood transfusion.  Pressure ulcer of right buttock Healed.   Type 2 diabetes mellitus with diabetic chronic kidney disease ([place]) 05/24/15 Hgb A1c 8.2 Continue Lantus 54 qd Novolog 12u for CBG >450 instead of 5u with meals. Update Hgb A1c  Infection of urinary tract 01/25/14 P. Mirabilis  Cipro 250mg  bid [name] 10 days.  04/24/15 P. Mirabilis Amoxicillin 500mg  bid [name] 7  10/17/15 E. Coli >100,000c/ml, 10 day course of Nitrofurantoin 100mg  bid     Family/ staff Communication: continue SNF for care needs.   Labs/tests ordered: CBC, CMP, TSH, Hgb a1c.

## 2015-10-21 NOTE — Assessment & Plan Note (Signed)
01/25/14 P. Mirabilis Cipro 250mg  bid x 10 days.  04/24/15 P. Mirabilis Amoxicillin 500mg  bid x 7  10/17/15 E. Coli >100,000c/ml, 10 day course of Nitrofurantoin 100mg  bid

## 2015-10-21 NOTE — Assessment & Plan Note (Signed)
Baseline chronic cough, continue  Prednisone 5mg  daily, completed Avelox 400mg  daily x 7 days.

## 2015-10-21 NOTE — Assessment & Plan Note (Signed)
05/24/15 Bun/creat 47/1.53, update CMP

## 2015-10-21 NOTE — Assessment & Plan Note (Signed)
Controlled, last TSH 3.433 05/24/15, continue Levothyroxine 6mcg daily. Update TSH

## 2015-10-21 NOTE — Assessment & Plan Note (Signed)
Mood is stable, continue Cymbalta 60mg daily 

## 2015-10-21 NOTE — Assessment & Plan Note (Signed)
Gradual decline, off Namenda R>B in setting of bone marrow dysplasia . Continue SNF for care needs 

## 2015-10-21 NOTE — Assessment & Plan Note (Signed)
05/24/15 Hgb A1c 8.2 Continue Lantus 54 qd Novolog 12u for CBG >450 instead of 5u with meals. Update Hgb A1c

## 2015-10-26 LAB — HEPATIC FUNCTION PANEL
ALK PHOS: 89 U/L (ref 25–125)
ALT: 26 U/L (ref 10–40)
AST: 15 U/L (ref 14–40)
Bilirubin, Total: 0.5 mg/dL

## 2015-10-26 LAB — BASIC METABOLIC PANEL
BUN: 54 mg/dL — AB (ref 4–21)
CREATININE: 1.4 mg/dL — AB (ref 0.6–1.3)
GLUCOSE: 74 mg/dL
POTASSIUM: 4.4 mmol/L (ref 3.4–5.3)
SODIUM: 134 mmol/L — AB (ref 137–147)

## 2015-10-26 LAB — TSH: TSH: 1.74 u[IU]/mL (ref 0.41–5.90)

## 2015-10-26 LAB — CBC AND DIFFERENTIAL
HCT: 21 % — AB (ref 41–53)
Hemoglobin: 7 g/dL — AB (ref 13.5–17.5)
WBC: 7 10^3/mL

## 2015-10-31 ENCOUNTER — Ambulatory Visit (HOSPITAL_COMMUNITY)
Admission: RE | Admit: 2015-10-31 | Discharge: 2015-10-31 | Disposition: A | Discharge status: Home / Self Care | Payer: Medicare Other | Source: Ambulatory Visit | Attending: Hematology | Admitting: Hematology

## 2015-10-31 DIAGNOSIS — D469 Myelodysplastic syndrome, unspecified: Secondary | ICD-10-CM

## 2015-11-03 ENCOUNTER — Other Ambulatory Visit: Payer: Self-pay | Admitting: Hematology and Oncology

## 2015-11-03 ENCOUNTER — Other Ambulatory Visit (HOSPITAL_BASED_OUTPATIENT_CLINIC_OR_DEPARTMENT_OTHER): Payer: Medicare Other

## 2015-11-03 ENCOUNTER — Ambulatory Visit (HOSPITAL_BASED_OUTPATIENT_CLINIC_OR_DEPARTMENT_OTHER): Payer: Medicare Other

## 2015-11-03 VITALS — BP 110/54 | HR 86 | Temp 98.1°F | Resp 20

## 2015-11-03 DIAGNOSIS — D469 Myelodysplastic syndrome, unspecified: Secondary | ICD-10-CM

## 2015-11-03 DIAGNOSIS — D631 Anemia in chronic kidney disease: Secondary | ICD-10-CM

## 2015-11-03 DIAGNOSIS — N189 Chronic kidney disease, unspecified: Secondary | ICD-10-CM

## 2015-11-03 LAB — CBC & DIFF AND RETIC
BASO%: 0.2 % (ref 0.0–2.0)
Basophils Absolute: 0 10*3/uL (ref 0.0–0.1)
EOS ABS: 0.1 10*3/uL (ref 0.0–0.5)
EOS%: 2.3 % (ref 0.0–7.0)
HCT: 19.6 % — ABNORMAL LOW (ref 38.4–49.9)
HGB: 6.5 g/dL — CL (ref 13.0–17.1)
IMMATURE RETIC FRACT: 13.2 % — AB (ref 3.00–10.60)
LYMPH#: 1.8 10*3/uL (ref 0.9–3.3)
LYMPH%: 28.6 % (ref 14.0–49.0)
MCH: 30 pg (ref 27.2–33.4)
MCHC: 33.2 g/dL (ref 32.0–36.0)
MCV: 90.3 fL (ref 79.3–98.0)
MONO#: 0.3 10*3/uL (ref 0.1–0.9)
MONO%: 4.6 % (ref 0.0–14.0)
NEUT%: 64.3 % (ref 39.0–75.0)
NEUTROS ABS: 3.9 10*3/uL (ref 1.5–6.5)
PLATELETS: 88 10*3/uL — AB (ref 140–400)
RBC: 2.17 10*6/uL — AB (ref 4.20–5.82)
RDW: 20.6 % — ABNORMAL HIGH (ref 11.0–14.6)
RETIC %: 1.19 % (ref 0.80–1.80)
RETIC CT ABS: 25.82 10*3/uL — AB (ref 34.80–93.90)
WBC: 6.1 10*3/uL (ref 4.0–10.3)
nRBC: 0 % (ref 0–0)

## 2015-11-03 LAB — PREPARE RBC (CROSSMATCH)

## 2015-11-03 LAB — TECHNOLOGIST REVIEW

## 2015-11-03 MED ORDER — ACETAMINOPHEN 325 MG PO TABS
ORAL_TABLET | ORAL | Status: AC
  Filled 2015-11-03: qty 2

## 2015-11-03 MED ORDER — ACETAMINOPHEN 325 MG PO TABS
650.0000 mg | ORAL_TABLET | Freq: Once | ORAL | Status: AC
  Administered 2015-11-03: 650 mg via ORAL

## 2015-11-03 MED ORDER — SODIUM CHLORIDE 0.9 % IV SOLN
250.0000 mL | Freq: Once | INTRAVENOUS | Status: AC
  Administered 2015-11-03: 250 mL via INTRAVENOUS

## 2015-11-03 MED ORDER — DIPHENHYDRAMINE HCL 25 MG PO CAPS
ORAL_CAPSULE | ORAL | Status: AC
  Filled 2015-11-03: qty 1

## 2015-11-03 MED ORDER — DIPHENHYDRAMINE HCL 25 MG PO CAPS
25.0000 mg | ORAL_CAPSULE | Freq: Once | ORAL | Status: AC
  Administered 2015-11-03: 25 mg via ORAL

## 2015-11-03 NOTE — Patient Instructions (Addendum)
   Blood Transfusion, Care After These instructions give you information about caring for yourself after your procedure. Your doctor may also give you more specific instructions. Call your doctor if you have any problems or questions after your procedure.  HOME CARE  Take medicines only as told by your doctor. Ask your doctor if you can take an over-the-counter pain reliever if you have a fever or headache a day or two after your procedure.  Return to your normal activities as told by your doctor. GET HELP IF:   You develop redness or irritation at your IV site.  You have a fever, chills, or a headache that does not go away.  Your pee (urine) is darker than normal.  Your urine turns:  Pink.  Red.  [place].  The white part of your eye turns yellow (jaundice).  You feel weak after doing your normal activities. GET HELP RIGHT AWAY IF:   You have trouble breathing.  You have fever and chills and you also have:  Anxiety.  Chest or back pain.  Flushed or pink skin.  Clammy or sweaty skin.  A fast heartbeat.  A sick feeling in your stomach (nausea).   This information is not intended to replace advice given to you by your health care provider. Make sure you discuss any questions you have with your health care provider.   Document Released: 08/06/2014 Document Reviewed: 08/06/2014 Elsevier Interactive Patient Education 2016 [place] [facility] [place] RECEIVED 2 UNITS OF PRBC WITH TYLENOL 650 AND BENADRY 25 AS PREMEDS. NO COMPLICATIONS AROSE.

## 2015-11-04 LAB — TYPE AND SCREEN
ABO/RH(D): O POS
Antibody Screen: NEGATIVE
Unit division: 0
Unit division: 0

## 2015-11-15 ENCOUNTER — Telehealth: Payer: Self-pay | Admitting: *Deleted

## 2015-11-15 ENCOUNTER — Other Ambulatory Visit: Payer: Self-pay | Admitting: Hematology and Oncology

## 2015-11-15 ENCOUNTER — Telehealth: Payer: Self-pay | Admitting: Hematology and Oncology

## 2015-11-15 NOTE — Telephone Encounter (Signed)
Call received in [place] from pt's wife stating pt receives blood transfusions monthly and is scheduled again on 12/01/2015.  " but he is not scheduled to be seen by Dr Alvy Bimler until August and I think he should be seen soon "  Per discussion- Mrs Lochlin states pt is having increase in " rambling when he is talking and then he is sleeping a lot more then he has been"   " Just see a lot of changes and think he is not doing good "  Return call number given for Mrs Brison is [phone].  Informed Mrs Hadden above concerns would be forwarded to MD and RN at desk for appropriate follow up.

## 2015-11-15 NOTE — Telephone Encounter (Signed)
spoke w/ pt wife confirmed appt change

## 2015-11-15 NOTE — Telephone Encounter (Signed)
1) He needs to get PCP to arrange for a visit to exclude other causes such as UTI 2) his mental status change is unlikely due to his blood problem but I have put POF to see him on the same day of his next appt at [facility] on 5/4. I will have to move his appointment a bit but will see him in the morning

## 2015-11-16 ENCOUNTER — Telehealth: Payer: Self-pay | Admitting: *Deleted

## 2015-11-16 NOTE — Telephone Encounter (Signed)
LVM for pt's wife to inform that Dr. Alvy Bimler recommends pt see his PCP for concerns of pt sleeping more and changes/decline in his condition.  Dr. Alvy Bimler will also see pt on 5/4 but wants to make sure pt sees PCP as well to assess for causes of changes such as possible UTI.  Asked her to call nurse back if any questions.

## 2015-11-21 ENCOUNTER — Non-Acute Institutional Stay (SKILLED_NURSING_FACILITY): Payer: Medicare Other | Admitting: Nurse Practitioner

## 2015-11-21 DIAGNOSIS — N189 Chronic kidney disease, unspecified: Secondary | ICD-10-CM | POA: Diagnosis not present

## 2015-11-21 DIAGNOSIS — D631 Anemia in chronic kidney disease: Secondary | ICD-10-CM

## 2015-11-21 DIAGNOSIS — F0390 Unspecified dementia without behavioral disturbance: Secondary | ICD-10-CM

## 2015-11-21 DIAGNOSIS — K269 Duodenal ulcer, unspecified as acute or chronic, without hemorrhage or perforation: Secondary | ICD-10-CM

## 2015-11-21 DIAGNOSIS — E039 Hypothyroidism, unspecified: Secondary | ICD-10-CM

## 2015-11-21 DIAGNOSIS — I10 Essential (primary) hypertension: Secondary | ICD-10-CM | POA: Diagnosis not present

## 2015-11-21 DIAGNOSIS — D696 Thrombocytopenia, unspecified: Secondary | ICD-10-CM | POA: Diagnosis not present

## 2015-11-21 DIAGNOSIS — D469 Myelodysplastic syndrome, unspecified: Secondary | ICD-10-CM

## 2015-11-21 DIAGNOSIS — F32A Depression, unspecified: Secondary | ICD-10-CM

## 2015-11-21 DIAGNOSIS — I5040 Unspecified combined systolic (congestive) and diastolic (congestive) heart failure: Secondary | ICD-10-CM | POA: Diagnosis not present

## 2015-11-21 DIAGNOSIS — E875 Hyperkalemia: Secondary | ICD-10-CM

## 2015-11-21 DIAGNOSIS — N182 Chronic kidney disease, stage 2 (mild): Secondary | ICD-10-CM | POA: Diagnosis not present

## 2015-11-21 DIAGNOSIS — F329 Major depressive disorder, single episode, unspecified: Secondary | ICD-10-CM

## 2015-11-21 DIAGNOSIS — J209 Acute bronchitis, unspecified: Secondary | ICD-10-CM

## 2015-11-21 DIAGNOSIS — E1122 Type 2 diabetes mellitus with diabetic chronic kidney disease: Secondary | ICD-10-CM

## 2015-11-21 NOTE — Assessment & Plan Note (Signed)
10/26/15, off Aranesp, f/u Oncology, prn blood transfusion.

## 2015-11-21 NOTE — Assessment & Plan Note (Signed)
Stable, continue Famotidine 20mg  daily, no c/o stomach pain, indigestion, or melena

## 2015-11-21 NOTE — Assessment & Plan Note (Signed)
Gradual decline, off Namenda R>B in setting of bone marrow dysplasia . Continue SNF for care needs 

## 2015-11-21 NOTE — Assessment & Plan Note (Signed)
Last Plt 74 10/26/15

## 2015-11-21 NOTE — Assessment & Plan Note (Signed)
Minimal BLE edema has no change, continue Furosemide 40mg , Spironolactone 25mg , 10/26/15 Na 134, K 4.4, Bun 54, creat 1.4

## 2015-11-21 NOTE — Assessment & Plan Note (Signed)
05/24/15 Hgb A1c 8.2, 10/26/15 Hgb a1c 8.6 Continue Lantus 54 qd Novolog 12u for CBG >450 instead of 5u with meals.

## 2015-11-21 NOTE — Assessment & Plan Note (Signed)
Mood is stable, continue Cymbalta 60mg daily 

## 2015-11-21 NOTE — Assessment & Plan Note (Signed)
Normalized, 10/26/15 K .4.4

## 2015-11-21 NOTE — Assessment & Plan Note (Signed)
10/26/15 Hgb 7.0, f/u oncology

## 2015-11-21 NOTE — Assessment & Plan Note (Addendum)
10/26/15 Na 134, K 4.4, Bun 54, creat 1.4 Furosemide and Spironolactone contributory

## 2015-11-21 NOTE — Assessment & Plan Note (Signed)
Baseline chronic cough, continue  Prednisone 5mg  daily

## 2015-11-21 NOTE — Assessment & Plan Note (Signed)
Controlled, continue Metoprolol 25mg bid. off Losartan due to CKD. Creatinine 1.5-2.0 

## 2015-11-21 NOTE — Assessment & Plan Note (Signed)
Controlled, last TSH 3.433 05/24/15, 10/25/15 TSH 1.74, continue Levothyroxine 67mcg daily

## 2015-11-21 NOTE — Progress Notes (Signed)
Patient ID: [name] [name], male   DOB: [birth year], 82 y.o.   MRN: [id number]  Location:  [facility] Room Number: 35 Place of Service:  SNF (31) Provider: Lennie Odor Luke Falero NP  GREEN, [name], MD  Patient Care Team: Estill Dooms, MD as PCP - General (Internal Medicine) Brina Umeda X, NP as Nurse Practitioner (Nurse Practitioner)  Extended Emergency Contact Information Primary Emergency Contact: Corrow,Dolores Address: [address]          [place], [place] [address] [place] Phone: [phone] Mobile Phone: [phone] Relation: Spouse  Code Status:  DNR Goals of care: Advanced Directive information Advanced Directives 11/21/2015  Does patient have an advance directive? Yes  Type of Advance Directive Living will;Out of facility DNR (pink MOST or yellow form)  Does patient want to make changes to advanced directive? No - Patient declined  Copy of advanced directive(s) in chart? Yes     Chief Complaint  Patient presents with  . Medical Management of Chronic Issues    Routine Visit    HPI:  Pt is a 82 y.o. male seen today for medical management of chronic diseases.  Hx of dementia, PUD, anemia, T2DM with blood sugar controlled on Lantus , hypertension on Metoprolol, chronic edema is managed with Furosemide 40mg  and Spironolactone 25mg  depression on Cymbalta 60mg , and hypothyroidism on Levothyroxine 25mcg.    10/17/15 urine culture E. Coli >100,000c/ml, Nitrofurantoin 100mg  bid x 10days started 10/17/15   Past Medical History  Diagnosis Date  . Hypothyroidism   . Hypertension   . CHF (congestive heart failure) ([place])   . Diabetes mellitus   . Coronary artery disease   . Restless leg syndrome   . Hyperlipemia   . Renal disease     stage II  . Sleep apnea   . Cellulitis and abscess of leg   . Obesity   . Duodenal ulcer, perforated ([place])   . Diverticulosis   . Internal hemorrhoids   . CAD (coronary artery disease)     . Anemia   . Duodenal ulcer   . Allergy   . Blood transfusion   . Cataract   . Tuberculosis     as a child  . Contact dermatitis and other eczema, due to unspecified cause 10/06/2012  . Senile dementia with depressive features 05/26/2012  . Hyposmolality and/or hyponatremia 04/02/2012  . Diarrhea 03/26/2012  . Hemorrhage of rectum and anus 03/25/2012  . Pneumonia, organism unspecified 03/12/2012  . 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 Folate-deficiency anemia 01/07/2012  . Thrombocytopenia, unspecified ([facility]) 09/26/2011  . Loss of weight 09/11/2011  . Dysphagia, unspecified(787.20) 06/28/2011  . Chronic kidney disease, stage III (moderate) 03/22/2011  . Cramp of limb 08/10/2010  . Chronic kidney disease, stage II (mild) 03/22/2011  . Personal history of fall 05/05/2009  . Unspecified vitamin D deficiency 02/24/2009  . Blood in stool 11/18/2008  . Dermatophytosis of the body 06/23/2007  . Obesity, unspecified 06/23/2007  . Unspecified tinnitus 06/23/2007  . Edema 06/23/2007  . Shortness of breath 06/23/2007  . Flatulence, eructation, and gas pain 06/23/2007  . Other general symptoms(780.99) 06/23/2007  . Memory loss 08/13/2003  . Screening PSA (prostate specific antigen) 07/27/2014  . Prostate cancer screening 07/27/2014  . Weight loss 07/27/2014   Past Surgical History  Procedure Laterality Date  . Laparotomy  09/12/2011    Procedure: EXPLORATORY LAPAROTOMY;  Surgeon: Judieth Keens, DO;  Location: [facility];  Service: General;  Laterality: N/A;  REPAIR PERFORATED ULCER  . Gastrostomy  09/12/2011    Procedure: GASTROSTOMY;  Surgeon: Judieth Keens, DO;  Location: [facility];  Service: General;  Laterality: N/A;  BIOPSY DUODENAL ULCER  . Shoulder surgery  [place], [place]  . Jejunostomy feeding tube      removed  . Back surgery  1942  . Eye surgery  [phone]    cataracts  . Coronary artery bypass graft  2201    Allergies   Allergen Reactions  . Enalapril Other (See Comments)    cough  . Metformin And Related Diarrhea  . Milk-Related Compounds Other (See Comments)    Doesn't remember but its a mild reaction      Medication List       This list is accurate as of: 11/21/15 11:59 PM.  Always use your most recent med list.               acetaminophen 325 MG tablet  Commonly known as:  TYLENOL  Take 650 mg by mouth every 4 (four) hours as needed (for pain).     BENEPROTEIN PO  Take by mouth. Take 2 scoops by mouth with beverage of choice twice daily     DULoxetine 60 MG capsule  Commonly known as:  CYMBALTA  Take 60 mg by mouth daily.     famotidine 20 MG tablet  Commonly known as:  PEPCID  Take 1 tablet by mouth at bedtime     folic acid 1 MG tablet  Commonly known as:  FOLVITE  Take 1 mg by mouth daily.     furosemide 40 MG tablet  Commonly known as:  LASIX  Take 40 mg by mouth daily.     guaiFENesin 100 MG/5ML liquid  Commonly known as:  ROBITUSSIN  Take 200 mg by mouth. Give 40ml every 6 hours as needed for cough     insulin aspart 100 UNIT/ML injection  Commonly known as:  novoLOG  CBG before meals and give 5 units For CBG>150 **If CBG >450 give 12 units instead of 5 units     insulin glargine 100 UNIT/ML injection  Commonly known as:  LANTUS  Inject 54 Units into the skin at bedtime.     levothyroxine 75 MCG tablet  Commonly known as:  SYNTHROID, LEVOTHROID  Take 75 mcg by mouth daily.     metoprolol tartrate 25 MG tablet  Commonly known as:  LOPRESSOR  Take 25 mg by mouth 2 (two) times daily.     predniSONE 5 MG tablet  Commonly known as:  DELTASONE  Take 5 mg by mouth daily.     spironolactone 25 MG tablet  Commonly known as:  ALDACTONE  Take 25 mg by mouth daily.     vitamin B-12 1000 MCG tablet  Commonly known as:  CYANOCOBALAMIN  Take 1,000 mcg by mouth daily.        Review of Systems  Constitutional: Negative for fever and chills.  HENT: Positive for  hearing loss. Negative for congestion, ear discharge and ear pain.   Eyes: Negative for pain, discharge and redness.  Respiratory: Positive for cough. Negative for shortness of breath and wheezing.   Cardiovascular: Positive for leg swelling. Negative for chest pain and palpitations.       Edema 1+ BLE  Gastrointestinal: Negative for nausea, vomiting, abdominal pain and constipation.  Genitourinary: Positive for frequency. Negative for dysuria and urgency.  Musculoskeletal: Negative  for myalgias, back pain and neck pain.       Left forearm pain, X-ray 09/04/14 negative study.   Skin: Negative for rash.       A small open wound at the right buttock, healed  Neurological: Negative for dizziness, tremors, seizures, weakness and headaches.  Hematological: Does not bruise/bleed easily.  Psychiatric/Behavioral: Negative for suicidal ideas and hallucinations. The patient is nervous/anxious.     Immunization History  Administered Date(s) Administered  . Influenza Whole 07/30/2006  . Influenza-Unspecified 06/02/2014, 04/20/2015  . PPD Test 10/06/2011  . Td 07/30/2006   Pertinent  Health Maintenance Due  Topic Date Due  . FOOT EXAM  07/26/1932  . OPHTHALMOLOGY EXAM  07/26/1932  . URINE MICROALBUMIN  07/26/1932  . PNA vac Low Risk Adult (1 of 2 - PCV13) 07/27/1987  . HEMOGLOBIN A1C  11/22/2015  . INFLUENZA VACCINE  02/28/2016   Fall Risk  06/16/2014 04/07/2014 01/13/2014  Falls in the past year? Yes - No  Number falls in past yr: 2 or more - -  Risk Factor Category  High Fall Risk - -  Risk for fall due to : History of fall(s);Impaired balance/gait;Impaired mobility Impaired mobility Impaired mobility  Risk for fall due to (comments): - fall band on -   Functional Status Survey:    Filed Vitals:   11/21/15 1435  BP: 124/64  Pulse: 76  Temp: 98.6 F (37 C)  TempSrc: Oral  Resp: 18  Height: 5' 8.5" (1.74 m)  Weight: 220 lb 12.8 oz (100.154 kg)   Body mass index is 33.08  kg/(m^2). Physical Exam  Constitutional: He is oriented to person, place, and time. He appears well-developed and well-nourished. No distress.  HENT:  Head: Normocephalic and atraumatic.  Right Ear: External ear normal.  Left Ear: External ear normal.  Nose: Nose normal.  Mouth/Throat: Oropharynx is clear and moist. No oropharyngeal exudate.  Eyes: Conjunctivae and EOM are normal. Pupils are equal, round, and reactive to light.  Neck: Normal range of motion. Neck supple. No JVD present. No thyromegaly present.  Cardiovascular: Normal rate, regular rhythm and normal heart sounds.   No murmur heard. Pulmonary/Chest: Effort normal. No respiratory distress. He has no wheezes. He has rales in the right lower field and the left lower field. He exhibits no tenderness.  Abdominal: Soft. Bowel sounds are normal. He exhibits no distension and no mass. There is no tenderness. There is no rebound.  Musculoskeletal: Normal range of motion. He exhibits edema (trace in BLE). He exhibits no tenderness.  1+ edema BLE  Lymphadenopathy:    He has no cervical adenopathy.  Neurological: He is alert and oriented to person, place, and time. He has normal reflexes. No cranial nerve deficit. Coordination normal.  Skin: Skin is warm and dry. No rash noted. He is not diaphoretic. No erythema.  A small open wound at the right buttock, healed.   Psychiatric: Thought content normal. His mood appears anxious. His affect is angry and inappropriate. His affect is not blunt and not labile. His speech is not delayed, not tangential and not slurred. He is agitated and aggressive. He is not hyperactive, not slowed, not withdrawn, not actively hallucinating and not combative. Cognition and memory are impaired. He exhibits a depressed mood. He is communicative. He exhibits abnormal recent memory. He is attentive.    Labs reviewed:  Recent Labs  05/24/15 10/26/15  NA 134* 134*  K 4.6 4.4  BUN 47* 54*  CREATININE 1.5* 1.4*  Recent Labs  05/24/15 10/26/15  AST 13* 15  ALT 16 26  ALKPHOS 79 89    Recent Labs  09/08/15 0813 10/06/15 0802 10/26/15 11/03/15 0804  WBC 7.3 6.3 7.0 6.1  NEUTROABS 5.0 4.6  --  3.9  HGB 7.6* 7.1* 7.0* 6.5*  HCT 23.3* 21.5* 21* 19.6*  MCV 92.5 91.5  --  90.3  PLT 86* 90*  --  88*   Lab Results  Component Value Date   TSH 1.74 10/26/2015   Lab Results  Component Value Date   HGBA1C 8.2* 05/24/2015   No results found for: CHOL, HDL, LDLCALC, LDLDIRECT, TRIG, CHOLHDL  Significant Diagnostic Results in last 30 days:  No results found.  Assessment/Plan  Anemia in chronic kidney disease 10/26/15 Hgb 7.0, f/u oncology  Acute bronchitis Baseline chronic cough, continue  Prednisone 5mg  daily   CHF (congestive heart failure) ([place]) Minimal BLE edema has no change, continue Furosemide 40mg , Spironolactone 25mg , 10/26/15 Na 134, K 4.4, Bun 54, creat 1.4   CKD (chronic kidney disease) 10/26/15 Na 134, K 4.4, Bun 54, creat 1.4 Furosemide and Spironolactone contributory   Dementia Gradual decline, off Namenda R>B in setting of bone marrow dysplasia . Continue SNF for care needs   Depression Mood is stable, continue Cymbalta 60mg  daily   Duodenal ulcer Stable, continue Famotidine 20mg  daily, no c/o stomach pain, indigestion, or melena  Essential hypertension, benign Controlled, continue Metoprolol 25mg  bid. off Losartan due to CKD. Creatinine 1.5-2.0   Hyperkalemia Normalized, 10/26/15 K .4.4  Hypothyroidism Controlled, last TSH 3.433 05/24/15, 10/25/15 TSH 1.74, continue Levothyroxine 48mcg daily  MDS (myelodysplastic syndrome) ([facility]) 10/26/15, off Aranesp, f/u Oncology, prn blood transfusion.   Thrombocytopenia (H[facility]) Last Plt 74 10/26/15  Type 2 diabetes mellitus with diabetic chronic kidney disease ([place]) 05/24/15 Hgb A1c 8.2, 10/26/15 Hgb a1c 8.6 Continue Lantus 54 qd Novolog 12u for CBG >450 instead of 5u with meals.     Family/ staff  Communication: continue SNF for care needs.   Labs/tests ordered: none

## 2015-11-24 ENCOUNTER — Encounter: Payer: Self-pay | Admitting: Nurse Practitioner

## 2015-11-28 ENCOUNTER — Ambulatory Visit (HOSPITAL_COMMUNITY)
Admission: RE | Admit: 2015-11-28 | Discharge: 2015-11-28 | Disposition: A | Discharge status: Home / Self Care | Payer: Medicare Other | Source: Ambulatory Visit | Attending: Hematology | Admitting: Hematology

## 2015-11-28 DIAGNOSIS — D469 Myelodysplastic syndrome, unspecified: Secondary | ICD-10-CM

## 2015-11-30 ENCOUNTER — Other Ambulatory Visit: Payer: Self-pay | Admitting: Hematology and Oncology

## 2015-11-30 DIAGNOSIS — D631 Anemia in chronic kidney disease: Secondary | ICD-10-CM

## 2015-11-30 DIAGNOSIS — N189 Chronic kidney disease, unspecified: Secondary | ICD-10-CM

## 2015-11-30 DIAGNOSIS — D469 Myelodysplastic syndrome, unspecified: Secondary | ICD-10-CM

## 2015-12-01 ENCOUNTER — Encounter: Payer: Self-pay | Admitting: General Practice

## 2015-12-01 ENCOUNTER — Other Ambulatory Visit: Payer: Medicare Other

## 2015-12-01 ENCOUNTER — Other Ambulatory Visit (HOSPITAL_BASED_OUTPATIENT_CLINIC_OR_DEPARTMENT_OTHER): Payer: Medicare Other

## 2015-12-01 ENCOUNTER — Ambulatory Visit (HOSPITAL_BASED_OUTPATIENT_CLINIC_OR_DEPARTMENT_OTHER): Payer: Medicare Other

## 2015-12-01 ENCOUNTER — Encounter: Payer: Self-pay | Admitting: Hematology and Oncology

## 2015-12-01 ENCOUNTER — Other Ambulatory Visit: Payer: Self-pay | Admitting: Hematology and Oncology

## 2015-12-01 ENCOUNTER — Ambulatory Visit (HOSPITAL_BASED_OUTPATIENT_CLINIC_OR_DEPARTMENT_OTHER): Payer: Medicare Other | Admitting: Hematology and Oncology

## 2015-12-01 VITALS — BP 105/53 | HR 84 | Temp 98.3°F | Resp 20

## 2015-12-01 VITALS — BP 124/60 | HR 85 | Temp 97.7°F | Resp 20

## 2015-12-01 DIAGNOSIS — D469 Myelodysplastic syndrome, unspecified: Secondary | ICD-10-CM | POA: Diagnosis present

## 2015-12-01 DIAGNOSIS — Z515 Encounter for palliative care: Secondary | ICD-10-CM | POA: Diagnosis not present

## 2015-12-01 DIAGNOSIS — F0390 Unspecified dementia without behavioral disturbance: Secondary | ICD-10-CM

## 2015-12-01 DIAGNOSIS — D696 Thrombocytopenia, unspecified: Secondary | ICD-10-CM | POA: Diagnosis not present

## 2015-12-01 DIAGNOSIS — D631 Anemia in chronic kidney disease: Secondary | ICD-10-CM

## 2015-12-01 DIAGNOSIS — N189 Chronic kidney disease, unspecified: Secondary | ICD-10-CM

## 2015-12-01 LAB — CBC & DIFF AND RETIC
BASO%: 0.3 % (ref 0.0–2.0)
Basophils Absolute: 0 10*3/uL (ref 0.0–0.1)
EOS ABS: 0.2 10*3/uL (ref 0.0–0.5)
EOS%: 3.5 % (ref 0.0–7.0)
HCT: 20.4 % — ABNORMAL LOW (ref 38.4–49.9)
HGB: 6.6 g/dL — CL (ref 13.0–17.1)
Immature Retic Fract: 7.5 % (ref 3.00–10.60)
LYMPH%: 27.5 % (ref 14.0–49.0)
MCH: 29.2 pg (ref 27.2–33.4)
MCHC: 32.4 g/dL (ref 32.0–36.0)
MCV: 90.3 fL (ref 79.3–98.0)
MONO#: 0.2 10*3/uL (ref 0.1–0.9)
MONO%: 2.6 % (ref 0.0–14.0)
NEUT%: 66.1 % (ref 39.0–75.0)
NEUTROS ABS: 4.6 10*3/uL (ref 1.5–6.5)
Platelets: 81 10*3/uL — ABNORMAL LOW (ref 140–400)
RBC: 2.26 10*6/uL — AB (ref 4.20–5.82)
RDW: 18.6 % — AB (ref 11.0–14.6)
RETIC CT ABS: 29.15 10*3/uL — AB (ref 34.80–93.90)
Retic %: 1.29 % (ref 0.80–1.80)
WBC: 7 10*3/uL (ref 4.0–10.3)
lymph#: 1.9 10*3/uL (ref 0.9–3.3)
nRBC: 0 % (ref 0–0)

## 2015-12-01 LAB — PREPARE RBC (CROSSMATCH)

## 2015-12-01 LAB — TECHNOLOGIST REVIEW

## 2015-12-01 MED ORDER — DIPHENHYDRAMINE HCL 25 MG PO CAPS
25.0000 mg | ORAL_CAPSULE | Freq: Once | ORAL | Status: AC
  Administered 2015-12-01: 25 mg via ORAL

## 2015-12-01 MED ORDER — SODIUM CHLORIDE 0.9 % IV SOLN
250.0000 mL | Freq: Once | INTRAVENOUS | Status: AC
  Administered 2015-12-01: 250 mL via INTRAVENOUS

## 2015-12-01 MED ORDER — FUROSEMIDE 10 MG/ML IJ SOLN
INTRAMUSCULAR | Status: AC
  Filled 2015-12-01: qty 2

## 2015-12-01 MED ORDER — FUROSEMIDE 10 MG/ML IJ SOLN
20.0000 mg | Freq: Once | INTRAMUSCULAR | Status: AC
  Administered 2015-12-01: 20 mg via INTRAVENOUS

## 2015-12-01 MED ORDER — ACETAMINOPHEN 325 MG PO TABS
ORAL_TABLET | ORAL | Status: AC
  Filled 2015-12-01: qty 2

## 2015-12-01 MED ORDER — DIPHENHYDRAMINE HCL 25 MG PO CAPS
ORAL_CAPSULE | ORAL | Status: AC
  Filled 2015-12-01: qty 1

## 2015-12-01 MED ORDER — ACETAMINOPHEN 325 MG PO TABS
650.0000 mg | ORAL_TABLET | Freq: Once | ORAL | Status: AC
  Administered 2015-12-01: 650 mg via ORAL

## 2015-12-01 NOTE — Assessment & Plan Note (Signed)
The cause is related to his myelodysplastic syndrome. It is mild and there is little change compared from previous platelet count. The patient denies recent history of bleeding such as epistaxis, hematuria or hematochezia. He is asymptomatic from the thrombocytopenia. I will observe for now.  he does not require platelet transfusion now.   

## 2015-12-01 NOTE — Progress Notes (Signed)
[facility] OFFICE PROGRESS NOTE  Patient Care Team: Estill Dooms, MD as PCP - General (Internal Medicine) Man Mast X, NP as Nurse Practitioner (Nurse Practitioner)  SUMMARY OF ONCOLOGIC HISTORY:   MDS (myelodysplastic syndrome) ([facility])   06/07/2014 Initial Diagnosis MDS (myelodysplastic syndrome)    INTERVAL HISTORY: Please see below for problem oriented charting.  REVIEW OF SYSTEMS:   Constitutional: Denies fevers, chills or abnormal weight loss Eyes: Denies blurriness of vision Ears, nose, mouth, throat, and face: Denies mucositis or sore throat Respiratory: Denies cough, dyspnea or wheezes Cardiovascular: Denies palpitation, chest discomfort or lower extremity swelling Gastrointestinal:  Denies nausea, heartburn or change in bowel habits Skin: Denies abnormal skin rashes Lymphatics: Denies new lymphadenopathy or easy bruising Neurological:Denies numbness, tingling or new weaknesses Behavioral/Psych: Mood is stable, no new changes  All other systems were reviewed with the patient and are negative.  I have reviewed the past medical history, past surgical history, social history and family history with the patient and they are unchanged from previous note.  ALLERGIES:  is allergic to enalapril; metformin and related; and milk-related compounds.  MEDICATIONS:  Current Outpatient Prescriptions  Medication Sig Dispense Refill  . acetaminophen (TYLENOL) 325 MG tablet Take 650 mg by mouth every 4 (four) hours as needed (for pain).     . BENEPROTEIN PO Take by mouth. Take 2 scoops by mouth with beverage of choice twice daily    . DULoxetine (CYMBALTA) 60 MG capsule Take 60 mg by mouth daily.    . famotidine (PEPCID) 20 MG tablet Take 1 tablet by mouth at bedtime    . folic acid (FOLVITE) 1 MG tablet Take 1 mg by mouth daily.     . furosemide (LASIX) 40 MG tablet Take 40 mg by mouth daily.    Marland Kitchen guaiFENesin (ROBITUSSIN) 100 MG/5ML liquid Take 200 mg by mouth. Give 56ml  every 6 hours as needed for cough    . insulin aspart (NOVOLOG) 100 UNIT/ML injection CBG before meals and give 5 units For CBG>150 **If CBG >450 give 12 units instead of 5 units    . insulin glargine (LANTUS) 100 UNIT/ML injection Inject 54 Units into the skin at bedtime.    Marland Kitchen levothyroxine (SYNTHROID, LEVOTHROID) 75 MCG tablet Take 75 mcg by mouth daily.     . metoprolol tartrate (LOPRESSOR) 25 MG tablet Take 25 mg by mouth 2 (two) times daily.    . predniSONE (DELTASONE) 5 MG tablet Take 5 mg by mouth daily.    Marland Kitchen spironolactone (ALDACTONE) 25 MG tablet Take 25 mg by mouth daily.    . vitamin B-12 (CYANOCOBALAMIN) 1000 MCG tablet Take 1,000 mcg by mouth daily.     No current facility-administered medications for this visit.   Facility-Administered Medications Ordered in Other Visits  Medication Dose Route Frequency Provider Last Rate Last Dose  . 0.9 %  sodium chloride infusion  250 mL Intravenous Once Heath Lark, MD      . furosemide (LASIX) injection 20 mg  20 mg Intravenous Once Heath Lark, MD        PHYSICAL EXAMINATION: ECOG PERFORMANCE STATUS: 3 - Symptomatic, >50% confined to bed  Filed Vitals:   12/01/15 0950  BP: 105/53  Pulse: 84  Temp: 98.3 F (36.8 C)  Resp: 20   There were no vitals filed for this visit.  GENERAL:alert, no distress and comfortable. He looks weak, moderately obese SKIN: skin color is pale, texture, turgor are normal, no rashes or significant lesions  EYES: normal, Conjunctiva are pale and non-injected, sclera clear LUNGS: clear to auscultation and percussion with increased breathing effort and wheezes Musculoskeletal:no cyanosis of digits and no clubbing  NEURO: alert with fluent speech LABORATORY DATA:  I have reviewed the data as listed    Component Value Date/Time   NA 134* 10/26/2015   NA 135* 09/22/2014 0905   NA 136 09/21/2011 0400   K 4.4 10/26/2015   K 5.6* 09/22/2014 0905   CL 99 09/21/2011 0400   CO2 25 09/22/2014 0905   CO2 30  09/21/2011 0400   GLUCOSE 210* 09/22/2014 0905   GLUCOSE 229* 09/21/2011 0400   BUN 54* 10/26/2015   BUN 34.4* 09/22/2014 0905   BUN 26* 09/21/2011 0400   CREATININE 1.4* 10/26/2015   CREATININE 1.7* 09/22/2014 0905   CREATININE 1.37* 09/21/2011 0400   CALCIUM 9.0 09/22/2014 0905   CALCIUM 7.8* 09/21/2011 0400   PROT 6.2* 09/22/2014 0905   PROT 5.4* 09/13/2011 0345   ALBUMIN 3.1* 09/22/2014 0905   ALBUMIN 2.2* 09/13/2011 0345   AST 15 10/26/2015   AST 10 09/22/2014 0905   ALT 26 10/26/2015   ALT 11 09/22/2014 0905   ALKPHOS 89 10/26/2015   ALKPHOS 109 09/22/2014 0905   BILITOT 0.39 09/22/2014 0905   BILITOT 0.3 09/13/2011 0345   GFRNONAA 44* 09/21/2011 0400   GFRAA 51* 09/21/2011 0400    No results found for: SPEP, UPEP  Lab Results  Component Value Date   WBC 7.0 12/01/2015   NEUTROABS 4.6 12/01/2015   HGB 6.6* 12/01/2015   HCT 20.4* 12/01/2015   MCV 90.3 12/01/2015   PLT 81* 12/01/2015      Chemistry      Component Value Date/Time   NA 134* 10/26/2015   NA 135* 09/22/2014 0905   NA 136 09/21/2011 0400   K 4.4 10/26/2015   K 5.6* 09/22/2014 0905   CL 99 09/21/2011 0400   CO2 25 09/22/2014 0905   CO2 30 09/21/2011 0400   BUN 54* 10/26/2015   BUN 34.4* 09/22/2014 0905   BUN 26* 09/21/2011 0400   CREATININE 1.4* 10/26/2015   CREATININE 1.7* 09/22/2014 0905   CREATININE 1.37* 09/21/2011 0400   GLU 74 10/26/2015      Component Value Date/Time   CALCIUM 9.0 09/22/2014 0905   CALCIUM 7.8* 09/21/2011 0400   ALKPHOS 89 10/26/2015   ALKPHOS 109 09/22/2014 0905   AST 15 10/26/2015   AST 10 09/22/2014 0905   ALT 26 10/26/2015   ALT 11 09/22/2014 0905   BILITOT 0.39 09/22/2014 0905   BILITOT 0.3 09/13/2011 0345      ASSESSMENT & PLAN:  MDS (myelodysplastic syndrome) ([facility]) The patient is not on any form of systemic treatment due to his age and comorbidities. Despite increasing the frequency of Aranesp injection he has lack of response to anemia and that  was subsequently discontinued He is in agreement to come in for palliative blood transfusion only.  Over the last 6 months, we have established a pattern where by the patient with required 2 units of blood every 4 weeks to keep hemoglobin above 8 g  I have arranged for him to return here every 4 weeks for blood transfusion   Thrombocytopenia ([place]) The cause is related to his myelodysplastic syndrome. It is mild and there is little change compared from previous platelet count. The patient denies recent history of bleeding such as epistaxis, hematuria or hematochezia. He is asymptomatic from the thrombocytopenia. I will observe for  now.  he does not require platelet transfusion now.    Dementia The patient has advanced dementia When I talked to him about goals of therapy, the patient repeatedly says he understood the meaning but I doubt so.   Palliative care encounter The patient is weak with significant comorbidities. I discussed with the patient the goals of care, advanced directives and others and it is very clear to me that he does not understand and is not able to make informed decisions about his care. I spoke with the wife extensively. The patient's wife and other caregivers are getting tired of bringing him over here on a monthly basis for palliative blood transfusions. I recommend his wife to have family meeting to discuss the possibility of discontinuation of blood transfusion. In the meantime, he has scheduled to return here on a monthly basis to get blood transfusion and I will see him back in a few months.   No orders of the defined types were placed in this encounter.   All questions were answered. The patient knows to call the clinic with any problems, questions or concerns. No barriers to learning was detected. I spent 25 minutes counseling the patient face to face. The total time spent in the appointment was 30 minutes and more than 50% was on counseling and review of test  results     [facility], Rohini Jaroszewski, MD 12/01/2015 1:13 PM

## 2015-12-01 NOTE — Assessment & Plan Note (Signed)
The patient is not on any form of systemic treatment due to his age and comorbidities. Despite increasing the frequency of Aranesp injection he has lack of response to anemia and that was subsequently discontinued He is in agreement to come in for palliative blood transfusion only.  Over the last 6 months, we have established a pattern where by the patient with required 2 units of blood every 4 weeks to keep hemoglobin above 8 g  I have arranged for him to return here every 4 weeks for blood transfusion

## 2015-12-01 NOTE — Patient Instructions (Signed)

## 2015-12-01 NOTE — Assessment & Plan Note (Signed)
The patient is weak with significant comorbidities. I discussed with the patient the goals of care, advanced directives and others and it is very clear to me that he does not understand and is not able to make informed decisions about his care. I spoke with the wife extensively. The patient's wife and other caregivers are getting tired of bringing him over here on a monthly basis for palliative blood transfusions. I recommend his wife to have family meeting to discuss the possibility of discontinuation of blood transfusion. In the meantime, he has scheduled to return here on a monthly basis to get blood transfusion and I will see him back in a few months.

## 2015-12-01 NOTE — Progress Notes (Signed)
Spiritual Care Note  Continuing to support Doug's wife [name] through caregiving stress.  She has verbalized several times that accompanying Doug for regular all-day appointments is causing both emotional and physical distress for her.  She also verbalizes intention to try to help him understand this strain.  She looks forward to care meeting, including daughter Jackelyn Poling (in [place]) on speaker phone, so that family can address concerns and goals together.    If pt and family desire, I could join them for care meeting; please just let me know.  Please also page if other needs arise.  Thank you.  [place], [place], [facility] Pager [phone] Voicemail [phone]

## 2015-12-01 NOTE — Assessment & Plan Note (Signed)
The patient has advanced dementia When I talked to him about goals of therapy, the patient repeatedly says he understood the meaning but I doubt so.

## 2015-12-02 LAB — TYPE AND SCREEN
ABO/RH(D): O POS
ANTIBODY SCREEN: NEGATIVE
UNIT DIVISION: 0
Unit division: 0

## 2015-12-19 ENCOUNTER — Non-Acute Institutional Stay (SKILLED_NURSING_FACILITY): Payer: Medicare Other | Admitting: Nurse Practitioner

## 2015-12-19 ENCOUNTER — Encounter: Payer: Self-pay | Admitting: Nurse Practitioner

## 2015-12-19 DIAGNOSIS — E039 Hypothyroidism, unspecified: Secondary | ICD-10-CM | POA: Diagnosis not present

## 2015-12-19 DIAGNOSIS — K269 Duodenal ulcer, unspecified as acute or chronic, without hemorrhage or perforation: Secondary | ICD-10-CM

## 2015-12-19 DIAGNOSIS — D696 Thrombocytopenia, unspecified: Secondary | ICD-10-CM

## 2015-12-19 DIAGNOSIS — E1122 Type 2 diabetes mellitus with diabetic chronic kidney disease: Secondary | ICD-10-CM

## 2015-12-19 DIAGNOSIS — F0390 Unspecified dementia without behavioral disturbance: Secondary | ICD-10-CM | POA: Diagnosis not present

## 2015-12-19 DIAGNOSIS — D631 Anemia in chronic kidney disease: Secondary | ICD-10-CM

## 2015-12-19 DIAGNOSIS — E875 Hyperkalemia: Secondary | ICD-10-CM

## 2015-12-19 DIAGNOSIS — N182 Chronic kidney disease, stage 2 (mild): Secondary | ICD-10-CM

## 2015-12-19 DIAGNOSIS — F329 Major depressive disorder, single episode, unspecified: Secondary | ICD-10-CM | POA: Diagnosis not present

## 2015-12-19 DIAGNOSIS — I10 Essential (primary) hypertension: Secondary | ICD-10-CM

## 2015-12-19 DIAGNOSIS — J200 Acute bronchitis due to Mycoplasma pneumoniae: Secondary | ICD-10-CM

## 2015-12-19 DIAGNOSIS — D469 Myelodysplastic syndrome, unspecified: Secondary | ICD-10-CM

## 2015-12-19 DIAGNOSIS — I5020 Unspecified systolic (congestive) heart failure: Secondary | ICD-10-CM | POA: Diagnosis not present

## 2015-12-19 DIAGNOSIS — N189 Chronic kidney disease, unspecified: Secondary | ICD-10-CM

## 2015-12-19 DIAGNOSIS — F32A Depression, unspecified: Secondary | ICD-10-CM

## 2015-12-19 NOTE — Assessment & Plan Note (Signed)
Minimal BLE edema has no change, continue Furosemide 40mg , Spironolactone 25mg , 10/26/15 Na 134, K 4.4, Bun 54, creat 1.4

## 2015-12-19 NOTE — Assessment & Plan Note (Signed)
Controlled, continue Metoprolol 25mg  bid. off Losartan due to CKD. Creatinine 1.5-2.0

## 2015-12-19 NOTE — Assessment & Plan Note (Signed)
Baseline chronic cough, continue  Prednisone 5mg  daily

## 2015-12-19 NOTE — Assessment & Plan Note (Signed)
Gradual decline, off Namenda R>B in setting of bone marrow dysplasia . Continue SNF for care needs

## 2015-12-19 NOTE — Assessment & Plan Note (Signed)
Normalized, 10/26/15 K .4.4

## 2015-12-19 NOTE — Assessment & Plan Note (Signed)
Controlled, last TSH 3.433 05/24/15, 10/25/15 TSH 1.74, continue Levothyroxine 45mcg daily

## 2015-12-19 NOTE — Assessment & Plan Note (Signed)
10/26/15, off Aranesp, f/u Oncology, prn blood transfusion.

## 2015-12-19 NOTE — Progress Notes (Signed)
Patient ID: [name] [name], male   DOB: 28-Jun-1920, 82 y.o.   MRN: [id number]  Location:  [facility] Room Number: 27 Place of Service:  SNF (31) Provider: Lennie Odor Lenise Jr NP  GREEN, [name], MD  Patient Care Team: Estill Dooms, MD as PCP - General (Internal Medicine) Leverne Tessler X, NP as Nurse Practitioner (Nurse Practitioner)  Extended Emergency Contact Information Primary Emergency Contact: Tyler,Dolores Address: [address]          [place], [place] [address] [place] Phone: [phone] Mobile Phone: [phone] Relation: Spouse  Code Status:  DNR Goals of care: Advanced Directive information Advanced Directives 12/19/2015  Does patient have an advance directive? Yes  Type of Paramedic of [place];Out of facility DNR (pink MOST or yellow form)  Does patient want to make changes to advanced directive? No - Patient declined  Copy of advanced directive(s) in chart? Yes  Pre-existing out of facility DNR order (yellow form or pink MOST form) -     Chief Complaint  Patient presents with  . Medical Management of Chronic Issues    HPI:  Pt is a 82 y.o. male seen today for medical management of chronic diseases.  Hx of dementia, PUD, anemia, T2DM with blood sugar controlled on Lantus , hypertension on Metoprolol, chronic edema is managed with Furosemide 40mg  and Spironolactone 25mg  depression on Cymbalta 60mg , and hypothyroidism on Levothyroxine 72mcg.    10/17/15 urine culture E. Coli >100,000c/ml, Nitrofurantoin 100mg  bid x 10days started 10/17/15   Past Medical History  Diagnosis Date  . Hypothyroidism   . Hypertension   . CHF (congestive heart failure) ([place])   . Diabetes mellitus   . Coronary artery disease   . Restless leg syndrome   . Hyperlipemia   . Renal disease     stage II  . Sleep apnea   . Cellulitis and abscess of leg   . Obesity   . Duodenal ulcer, perforated ([place])   .  Diverticulosis   . Internal hemorrhoids   . CAD (coronary artery disease)   . Anemia   . Duodenal ulcer   . Allergy   . Blood transfusion   . Cataract   . Tuberculosis     as a child  . Contact dermatitis and other eczema, due to unspecified cause 10/06/2012  . Senile dementia with depressive features 05/26/2012  . Hyposmolality and/or hyponatremia 04/02/2012  . Diarrhea 03/26/2012  . Hemorrhage of rectum and anus 03/25/2012  . Pneumonia, organism unspecified 03/12/2012  . 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 Folate-deficiency anemia 01/07/2012  . Thrombocytopenia, unspecified ([place]) 09/26/2011  . Loss of weight 09/11/2011  . Dysphagia, unspecified(787.20) 06/28/2011  . Chronic kidney disease, stage III (moderate) 03/22/2011  . Cramp of limb 08/10/2010  . Chronic kidney disease, stage II (mild) 03/22/2011  . Personal history of fall 05/05/2009  . Unspecified vitamin D deficiency 02/24/2009  . Blood in stool 11/18/2008  . Dermatophytosis of the body 06/23/2007  . Obesity, unspecified 06/23/2007  . Unspecified tinnitus 06/23/2007  . Edema 06/23/2007  . Shortness of breath 06/23/2007  . Flatulence, eructation, and gas pain 06/23/2007  . Other general symptoms(780.99) 06/23/2007  . Memory loss 08/13/2003  . Screening PSA (prostate specific antigen) 07/27/2014  . Prostate cancer screening 07/27/2014  . Weight loss 07/27/2014   Past Surgical History  Procedure Laterality Date  . Laparotomy  09/12/2011    Procedure: EXPLORATORY  LAPAROTOMY;  Surgeon: Judieth Keens, DO;  Location: [facility];  Service: General;  Laterality: N/A;  REPAIR PERFORATED ULCER  . Gastrostomy  09/12/2011    Procedure: GASTROSTOMY;  Surgeon: Judieth Keens, DO;  Location: [facility];  Service: General;  Laterality: N/A;  BIOPSY DUODENAL ULCER  . Shoulder surgery  [place], [place]  . Jejunostomy feeding tube      removed  . Back surgery  1942  . Eye surgery  [phone]      cataracts  . Coronary artery bypass graft  2201    Allergies  Allergen Reactions  . Enalapril Other (See Comments)    cough  . Metformin And Related Diarrhea  . Milk-Related Compounds Other (See Comments)    Doesn't remember but its a mild reaction      Medication List       This list is accurate as of: 12/19/15  2:27 PM.  Always use your most recent med list.               acetaminophen 325 MG tablet  Commonly known as:  TYLENOL  Take 650 mg by mouth every 4 (four) hours as needed (for pain).     BENEPROTEIN PO  Take by mouth. Take 2 scoops by mouth with beverage of choice twice daily     DULoxetine 60 MG capsule  Commonly known as:  CYMBALTA  Take 60 mg by mouth daily.     famotidine 20 MG tablet  Commonly known as:  PEPCID  Take 1 tablet by mouth at bedtime     folic acid 1 MG tablet  Commonly known as:  FOLVITE  Take 1 mg by mouth daily.     furosemide 40 MG tablet  Commonly known as:  LASIX  Take 40 mg by mouth daily.     guaiFENesin 100 MG/5ML liquid  Commonly known as:  ROBITUSSIN  Take 200 mg by mouth. Give 33ml every 6 hours as needed for cough     insulin aspart 100 UNIT/ML injection  Commonly known as:  novoLOG  CBG before meals and give 5 units For CBG>150 **If CBG >450 give 12 units instead of 5 units     insulin glargine 100 UNIT/ML injection  Commonly known as:  LANTUS  Inject 54 Units into the skin at bedtime.     levothyroxine 75 MCG tablet  Commonly known as:  SYNTHROID, LEVOTHROID  Take 75 mcg by mouth daily.     metoprolol tartrate 25 MG tablet  Commonly known as:  LOPRESSOR  Take 25 mg by mouth 2 (two) times daily.     predniSONE 5 MG tablet  Commonly known as:  DELTASONE  Take 5 mg by mouth daily.     spironolactone 25 MG tablet  Commonly known as:  ALDACTONE  Take 25 mg by mouth daily.     vitamin B-12 1000 MCG tablet  Commonly known as:  CYANOCOBALAMIN  Take 1,000 mcg by mouth daily.        Review of Systems   Constitutional: Negative for fever and chills.  HENT: Positive for hearing loss. Negative for congestion, ear discharge and ear pain.   Eyes: Negative for pain, discharge and redness.  Respiratory: Positive for cough. Negative for shortness of breath and wheezing.   Cardiovascular: Positive for leg swelling. Negative for chest pain and palpitations.       Edema 1+ BLE  Gastrointestinal: Negative for nausea, vomiting, abdominal pain and constipation.  Genitourinary: Positive for frequency. Negative for dysuria and urgency.  Musculoskeletal: Negative for myalgias, back pain and neck pain.       Left forearm pain, X-ray 09/04/14 negative study.   Skin: Negative for rash.       A small open wound at the right buttock, healed  Neurological: Negative for dizziness, tremors, seizures, weakness and headaches.  Hematological: Does not bruise/bleed easily.  Psychiatric/Behavioral: Negative for suicidal ideas and hallucinations. The patient is nervous/anxious.     Immunization History  Administered Date(s) Administered  . Influenza Whole 07/30/2006  . Influenza-Unspecified 06/02/2014, 04/20/2015  . PPD Test 10/06/2011  . Td 07/30/2006   Pertinent  Health Maintenance Due  Topic Date Due  . FOOT EXAM  07/26/1932  . OPHTHALMOLOGY EXAM  07/26/1932  . URINE MICROALBUMIN  07/26/1932  . PNA vac Low Risk Adult (1 of 2 - PCV13) 07/27/1987  . HEMOGLOBIN A1C  11/22/2015  . INFLUENZA VACCINE  02/28/2016   Fall Risk  06/16/2014 04/07/2014 01/13/2014  Falls in the past year? Yes - No  Number falls in past yr: 2 or more - -  Risk Factor Category  High Fall Risk - -  Risk for fall due to : History of fall(s);Impaired balance/gait;Impaired mobility Impaired mobility Impaired mobility  Risk for fall due to (comments): - fall band on -   Functional Status Survey:    Filed Vitals:   12/19/15 1107  BP: 114/60  Pulse: 74  Temp: 98.3 F (36.8 C)  TempSrc: Oral  Resp: 20  Height: 5' 8.5" (1.74 m)   Weight: 219 lb 9.6 oz (99.61 kg)   Body mass index is 32.9 kg/(m^2). Physical Exam  Constitutional: He is oriented to person, place, and time. He appears well-developed and well-nourished. No distress.  HENT:  Head: Normocephalic and atraumatic.  Right Ear: External ear normal.  Left Ear: External ear normal.  Nose: Nose normal.  Mouth/Throat: Oropharynx is clear and moist. No oropharyngeal exudate.  Eyes: Conjunctivae and EOM are normal. Pupils are equal, round, and reactive to light.  Neck: Normal range of motion. Neck supple. No JVD present. No thyromegaly present.  Cardiovascular: Normal rate, regular rhythm and normal heart sounds.   No murmur heard. Pulmonary/Chest: Effort normal. No respiratory distress. He has no wheezes. He has rales in the right lower field and the left lower field. He exhibits no tenderness.  Abdominal: Soft. Bowel sounds are normal. He exhibits no distension and no mass. There is no tenderness. There is no rebound.  Musculoskeletal: Normal range of motion. He exhibits edema (trace in BLE). He exhibits no tenderness.  1+ edema BLE  Lymphadenopathy:    He has no cervical adenopathy.  Neurological: He is alert and oriented to person, place, and time. He has normal reflexes. No cranial nerve deficit. Coordination normal.  Skin: Skin is warm and dry. No rash noted. He is not diaphoretic. No erythema.  A small open wound at the right buttock, healed.   Psychiatric: Thought content normal. His mood appears anxious. His affect is angry and inappropriate. His affect is not blunt and not labile. His speech is not delayed, not tangential and not slurred. He is agitated and aggressive. He is not hyperactive, not slowed, not withdrawn, not actively hallucinating and not combative. Cognition and memory are impaired. He exhibits a depressed mood. He is communicative. He exhibits abnormal recent memory. He is attentive.    Labs reviewed:  Recent Labs  05/24/15 10/26/15   NA 134* 134*  K  4.6 4.4  BUN 47* 54*  CREATININE 1.5* 1.4*    Recent Labs  05/24/15 10/26/15  AST 13* 15  ALT 16 26  ALKPHOS 79 89    Recent Labs  10/06/15 0802 10/26/15 11/03/15 0804 12/01/15 0840  WBC 6.3 7.0 6.1 7.0  NEUTROABS 4.6  --  3.9 4.6  HGB 7.1* 7.0* 6.5* 6.6*  HCT 21.5* 21* 19.6* 20.4*  MCV 91.5  --  90.3 90.3  PLT 90*  --  88* 81*   Lab Results  Component Value Date   TSH 1.74 10/26/2015   Lab Results  Component Value Date   HGBA1C 8.2* 05/24/2015   No results found for: CHOL, HDL, LDLCALC, LDLDIRECT, TRIG, CHOLHDL  Significant Diagnostic Results in last 30 days:  No results found.  Assessment/Plan  Type 2 diabetes mellitus with diabetic chronic kidney disease ([place]) 0/25/16 Hgb A1c 8.2, 10/26/15 Hgb a1c 8.6 Continue Lantus 54 qd Novolog 12u for CBG >450 instead of 5u with meals.    Thrombocytopenia ([facility]) Last Plt 74 10/26/15   MDS (myelodysplastic syndrome) ([place]) 10/26/15, off Aranesp, f/u Oncology, prn blood transfusion.    Hypothyroidism Controlled, last TSH 3.433 05/24/15, 10/25/15 TSH 1.74, continue Levothyroxine 61mcg daily   Hyperkalemia Normalized, 10/26/15 K .4.4   Essential hypertension, benign Controlled, continue Metoprolol 25mg  bid. off Losartan due to CKD. Creatinine 1.5-2.0    Duodenal ulcer Stable, continue Famotidine 20mg  daily, no c/o stomach pain, indigestion, or melena   Depression Mood is stable, continue Cymbalta 60mg  daily   Dementia Gradual decline, off Namenda R>B in setting of bone marrow dysplasia . Continue SNF for care needs    CKD (chronic kidney disease) 10/26/15 Na 134, K 4.4, Bun 54, creat 1.4 Furosemide and Spironolactone contributory   CHF (congestive heart failure) (HCC) Minimal BLE edema has no change, continue Furosemide 40mg , Spironolactone 25mg , 10/26/15 Na 134, K 4.4, Bun 54, creat 1.4    Anemia in chronic kidney disease 10/26/15 Hgb 7.0, f/u oncology   Acute  bronchitis Baseline chronic cough, continue  Prednisone 5mg  daily      Family/ staff Communication: continue SNF for care needs.   Labs/tests ordered: none

## 2015-12-19 NOTE — Assessment & Plan Note (Signed)
Last Plt 74 10/26/15

## 2015-12-19 NOTE — Assessment & Plan Note (Signed)
Mood is stable, continue Cymbalta 60mg  daily

## 2015-12-19 NOTE — Assessment & Plan Note (Signed)
Stable, continue Famotidine 20mg  daily, no c/o stomach pain, indigestion, or melena

## 2015-12-19 NOTE — Assessment & Plan Note (Signed)
10/26/15 Hgb 7.0, f/u oncology

## 2015-12-19 NOTE — Assessment & Plan Note (Signed)
0/25/16 Hgb A1c 8.2, 10/26/15 Hgb a1c 8.6 Continue Lantus 54 qd Novolog 12u for CBG >450 instead of 5u with meals.

## 2015-12-19 NOTE — Assessment & Plan Note (Signed)
10/26/15 Na 134, K 4.4, Bun 54, creat 1.4 Furosemide and Spironolactone contributory

## 2015-12-29 ENCOUNTER — Ambulatory Visit (HOSPITAL_COMMUNITY)
Admission: RE | Admit: 2015-12-29 | Discharge: 2015-12-29 | Disposition: A | Discharge status: Home / Self Care | Payer: Medicare Other | Source: Ambulatory Visit | Attending: Hematology | Admitting: Hematology

## 2015-12-29 ENCOUNTER — Ambulatory Visit (HOSPITAL_BASED_OUTPATIENT_CLINIC_OR_DEPARTMENT_OTHER): Payer: Medicare Other

## 2015-12-29 ENCOUNTER — Encounter: Payer: Self-pay | Admitting: General Practice

## 2015-12-29 ENCOUNTER — Other Ambulatory Visit: Payer: Self-pay | Admitting: Hematology and Oncology

## 2015-12-29 ENCOUNTER — Other Ambulatory Visit (HOSPITAL_BASED_OUTPATIENT_CLINIC_OR_DEPARTMENT_OTHER): Payer: Medicare Other

## 2015-12-29 VITALS — BP 97/54 | HR 78 | Temp 98.4°F | Resp 20

## 2015-12-29 DIAGNOSIS — D469 Myelodysplastic syndrome, unspecified: Secondary | ICD-10-CM

## 2015-12-29 DIAGNOSIS — N189 Chronic kidney disease, unspecified: Secondary | ICD-10-CM

## 2015-12-29 DIAGNOSIS — D631 Anemia in chronic kidney disease: Secondary | ICD-10-CM

## 2015-12-29 LAB — CBC & DIFF AND RETIC
BASO%: 0.2 % (ref 0.0–2.0)
Basophils Absolute: 0 10*3/uL (ref 0.0–0.1)
EOS ABS: 0 10*3/uL (ref 0.0–0.5)
EOS%: 0 % (ref 0.0–7.0)
HCT: 18 % — ABNORMAL LOW (ref 38.4–49.9)
HGB: 5.8 g/dL — CL (ref 13.0–17.1)
IMMATURE RETIC FRACT: 10.8 % — AB (ref 3.00–10.60)
LYMPH%: 23.8 % (ref 14.0–49.0)
MCH: 29.3 pg (ref 27.2–33.4)
MCHC: 32.2 g/dL (ref 32.0–36.0)
MCV: 90.9 fL (ref 79.3–98.0)
MONO#: 0.4 10*3/uL (ref 0.1–0.9)
MONO%: 6.9 % (ref 0.0–14.0)
NEUT%: 69.1 % (ref 39.0–75.0)
NEUTROS ABS: 3.9 10*3/uL (ref 1.5–6.5)
NRBC: 0 % (ref 0–0)
Platelets: 89 10*3/uL — ABNORMAL LOW (ref 140–400)
RBC: 1.98 10*6/uL — AB (ref 4.20–5.82)
RDW: 19 % — AB (ref 11.0–14.6)
RETIC CT ABS: 26.14 10*3/uL — AB (ref 34.80–93.90)
Retic %: 1.32 % (ref 0.80–1.80)
WBC: 5.7 10*3/uL (ref 4.0–10.3)
lymph#: 1.4 10*3/uL (ref 0.9–3.3)

## 2015-12-29 LAB — PREPARE RBC (CROSSMATCH)

## 2015-12-29 MED ORDER — SODIUM CHLORIDE 0.9% FLUSH
10.0000 mL | INTRAVENOUS | Status: DC | PRN
  Filled 2015-12-29: qty 10

## 2015-12-29 MED ORDER — DIPHENHYDRAMINE HCL 25 MG PO CAPS
ORAL_CAPSULE | ORAL | Status: AC
  Filled 2015-12-29: qty 2

## 2015-12-29 MED ORDER — ACETAMINOPHEN 325 MG PO TABS
ORAL_TABLET | ORAL | Status: AC
  Filled 2015-12-29: qty 2

## 2015-12-29 MED ORDER — DIPHENHYDRAMINE HCL 25 MG PO CAPS
25.0000 mg | ORAL_CAPSULE | Freq: Once | ORAL | Status: AC
  Administered 2015-12-29: 25 mg via ORAL

## 2015-12-29 MED ORDER — ACETAMINOPHEN 325 MG PO TABS
650.0000 mg | ORAL_TABLET | Freq: Once | ORAL | Status: AC
  Administered 2015-12-29: 650 mg via ORAL

## 2015-12-29 MED ORDER — HEPARIN SOD (PORK) LOCK FLUSH 100 UNIT/ML IV SOLN
250.0000 [IU] | INTRAVENOUS | Status: DC | PRN
  Filled 2015-12-29: qty 5

## 2015-12-29 MED ORDER — SODIUM CHLORIDE 0.9 % IV SOLN
250.0000 mL | Freq: Once | INTRAVENOUS | Status: AC
  Administered 2015-12-29: 250 mL via INTRAVENOUS

## 2015-12-29 NOTE — Patient Instructions (Signed)

## 2015-12-29 NOTE — Progress Notes (Signed)
Patient can not assist when he moves from his wheelchair to the bed. "[place]" used to get patient from wheelchair to bed. It takes three people. Pt's wife spoke privately with me and asked "how long can these transfusions go on? I am getting tired coming for this." Patient encouraged to speak with his doctor.

## 2015-12-29 NOTE — Progress Notes (Signed)
Spiritual Care Note  Doug's wife [name] seeks out chaplain his transfusion days for emotional, spiritual, and social support related to caregiver stress.  Sat with Dolores in healing garden, providing opportunity for her to share feelings and process concerns.  Followed up with Marden Noble, who was very drowsy, in infusion.  Continue to serve as sounding board as [name] discerns how best to support Marden Noble, including understanding what "quality" means to him, while also practicing care and boundaries for herself. Following, but please also page as needs arise/circumstances change.  Thank you.  Dorris, [place], [facility] Pager [phone] Voicemail [phone]

## 2015-12-30 LAB — TYPE AND SCREEN
ABO/RH(D): O POS
Antibody Screen: NEGATIVE
UNIT DIVISION: 0
Unit division: 0

## 2016-01-02 ENCOUNTER — Non-Acute Institutional Stay (SKILLED_NURSING_FACILITY): Payer: Medicare Other | Admitting: Nurse Practitioner

## 2016-01-02 ENCOUNTER — Encounter: Payer: Self-pay | Admitting: Nurse Practitioner

## 2016-01-02 ENCOUNTER — Encounter: Payer: Self-pay | Admitting: *Deleted

## 2016-01-02 DIAGNOSIS — I10 Essential (primary) hypertension: Secondary | ICD-10-CM

## 2016-01-02 DIAGNOSIS — D631 Anemia in chronic kidney disease: Secondary | ICD-10-CM | POA: Diagnosis not present

## 2016-01-02 DIAGNOSIS — E039 Hypothyroidism, unspecified: Secondary | ICD-10-CM

## 2016-01-02 DIAGNOSIS — F0390 Unspecified dementia without behavioral disturbance: Secondary | ICD-10-CM

## 2016-01-02 DIAGNOSIS — E1122 Type 2 diabetes mellitus with diabetic chronic kidney disease: Secondary | ICD-10-CM

## 2016-01-02 DIAGNOSIS — R627 Adult failure to thrive: Secondary | ICD-10-CM | POA: Diagnosis not present

## 2016-01-02 DIAGNOSIS — F329 Major depressive disorder, single episode, unspecified: Secondary | ICD-10-CM | POA: Diagnosis not present

## 2016-01-02 DIAGNOSIS — I5020 Unspecified systolic (congestive) heart failure: Secondary | ICD-10-CM

## 2016-01-02 DIAGNOSIS — D469 Myelodysplastic syndrome, unspecified: Secondary | ICD-10-CM

## 2016-01-02 DIAGNOSIS — N189 Chronic kidney disease, unspecified: Secondary | ICD-10-CM

## 2016-01-02 DIAGNOSIS — F32A Depression, unspecified: Secondary | ICD-10-CM

## 2016-01-02 DIAGNOSIS — N182 Chronic kidney disease, stage 2 (mild): Secondary | ICD-10-CM | POA: Diagnosis not present

## 2016-01-02 NOTE — Assessment & Plan Note (Signed)
0/25/16 Hgb A1c 8.2, 10/26/15 Hgb a1c 8.6 Continue Lantus 54 qd Novolog 12u for CBG >450 instead of 5u with meals.

## 2016-01-02 NOTE — Assessment & Plan Note (Signed)
Gradual decline, off Namenda R>B in setting of bone marrow dysplasia . Continue SNF for care needs

## 2016-01-02 NOTE — Assessment & Plan Note (Signed)
10/26/15, off Aranesp, prn blood transfusion. Desires Hospice for future care

## 2016-01-02 NOTE — Progress Notes (Signed)
This encounter was created in error - please disregard.

## 2016-01-02 NOTE — Assessment & Plan Note (Signed)
Minimal BLE edema has no change, continue Furosemide 40mg , Spironolactone 25mg , 10/26/15 Na 134, K 4.4, Bun 54, creat 1.4

## 2016-01-02 NOTE — Assessment & Plan Note (Signed)
Mood is stable, continue Cymbalta 60mg  daily

## 2016-01-02 NOTE — Assessment & Plan Note (Signed)
Controlled, continue Metoprolol 25mg  bid. off Losartan due to CKD. Creatinine 1.5-2.0

## 2016-01-02 NOTE — Assessment & Plan Note (Signed)
Hgb 7s, hx of frequent blood transfusion due to MDS, desires hospice for future care.

## 2016-01-02 NOTE — Progress Notes (Signed)
Patient ID: [name] [name], male   DOB: 08/10/1919, 82 y.o.   MRN: [id number]  Location:  [facility] Room Number: 24 Place of Service:  SNF (31) Provider: Lennie Odor Xachary Hambly NP  GREEN, [name], MD  Patient Care Team: Estill Dooms, MD as PCP - General (Internal Medicine) Jaser Fullen X, NP as Nurse Practitioner (Nurse Practitioner)  Extended Emergency Contact Information Primary Emergency Contact: Cannedy,Dolores Address: [address]          [place], [place] [address] [place] Phone: [phone] Mobile Phone: [phone] Relation: Spouse  Code Status:  DNR Goals of care: Advanced Directive information Advanced Directives 01/02/2016  Does patient have an advance directive? Yes  Type of Paramedic of [place];Out of facility DNR (pink MOST or yellow form)  Does patient want to make changes to advanced directive? No - Patient declined  Copy of advanced directive(s) in chart? Yes     Chief Complaint  Patient presents with  . Medical Management of Chronic Issues    HPI:  Pt is a 82 y.o. male seen today for medical management of chronic diseases.  Hx of dementia, PUD, anemia, T2DM with blood sugar controlled on Lantus , hypertension on Metoprolol, chronic edema is managed with Furosemide 40mg  and Spironolactone 25mg  depression on Cymbalta 60mg , and hypothyroidism on Levothyroxine 6mcg.    10/17/15 urine culture E. Coli >100,000c/ml, Nitrofurantoin 100mg  bid x 10days started 10/17/15   Past Medical History  Diagnosis Date  . Hypothyroidism   . Hypertension   . CHF (congestive heart failure) ([place])   . Diabetes mellitus   . Coronary artery disease   . Restless leg syndrome   . Hyperlipemia   . Renal disease     stage II  . Sleep apnea   . Cellulitis and abscess of leg   . Obesity   . Duodenal ulcer, perforated ([place])   . Diverticulosis   . Internal hemorrhoids   . CAD (coronary artery disease)   .  Anemia   . Duodenal ulcer   . Allergy   . Blood transfusion   . Cataract   . Tuberculosis     as a child  . Contact dermatitis and other eczema, due to unspecified cause 10/06/2012  . Senile dementia with depressive features 05/26/2012  . Hyposmolality and/or hyponatremia 04/02/2012  . Diarrhea 03/26/2012  . Hemorrhage of rectum and anus 03/25/2012  . Pneumonia, organism unspecified 03/12/2012  . 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 Folate-deficiency anemia 01/07/2012  . Thrombocytopenia, unspecified ([place]) 09/26/2011  . Loss of weight 09/11/2011  . Dysphagia, unspecified(787.20) 06/28/2011  . Chronic kidney disease, stage III (moderate) 03/22/2011  . Cramp of limb 08/10/2010  . Chronic kidney disease, stage II (mild) 03/22/2011  . Personal history of fall 05/05/2009  . Unspecified vitamin D deficiency 02/24/2009  . Blood in stool 11/18/2008  . Dermatophytosis of the body 06/23/2007  . Obesity, unspecified 06/23/2007  . Unspecified tinnitus 06/23/2007  . Edema 06/23/2007  . Shortness of breath 06/23/2007  . Flatulence, eructation, and gas pain 06/23/2007  . Other general symptoms(780.99) 06/23/2007  . Memory loss 08/13/2003  . Screening PSA (prostate specific antigen) 07/27/2014  . Prostate cancer screening 07/27/2014  . Weight loss 07/27/2014   Past Surgical History  Procedure Laterality Date  . Laparotomy  09/12/2011    Procedure: EXPLORATORY LAPAROTOMY;  Surgeon: Judieth Keens, DO;  Location: [facility];  Service: General;  Laterality: N/A;  REPAIR PERFORATED ULCER  . Gastrostomy  09/12/2011    Procedure: GASTROSTOMY;  Surgeon: Judieth Keens, DO;  Location: [facility];  Service: General;  Laterality: N/A;  BIOPSY DUODENAL ULCER  . Shoulder surgery  [place], [place]  . Jejunostomy feeding tube      removed  . Back surgery  1942  . Eye surgery  [phone]    cataracts  . Coronary artery bypass graft  2201    Allergies  Allergen  Reactions  . Enalapril Other (See Comments)    cough  . Metformin And Related Diarrhea  . Milk-Related Compounds Other (See Comments)    Doesn't remember but its a mild reaction      Medication List       This list is accurate as of: 01/02/16  6:17 PM.  Always use your most recent med list.               acetaminophen 325 MG tablet  Commonly known as:  TYLENOL  Take 650 mg by mouth every 4 (four) hours as needed (for pain).     BENEPROTEIN PO  Take by mouth. Take 2 scoops by mouth with beverage of choice twice daily     DULoxetine 60 MG capsule  Commonly known as:  CYMBALTA  Take 60 mg by mouth daily.     famotidine 20 MG tablet  Commonly known as:  PEPCID  Take 1 tablet by mouth at bedtime     folic acid 1 MG tablet  Commonly known as:  FOLVITE  Take 1 mg by mouth daily.     furosemide 40 MG tablet  Commonly known as:  LASIX  Take 40 mg by mouth daily.     guaiFENesin 100 MG/5ML liquid  Commonly known as:  ROBITUSSIN  Take 200 mg by mouth. Give 20ml every 6 hours as needed for cough     insulin aspart 100 UNIT/ML injection  Commonly known as:  novoLOG  CBG before meals and give 5 units For CBG>150 **If CBG >450 give 12 units instead of 5 units     insulin glargine 100 UNIT/ML injection  Commonly known as:  LANTUS  Inject 54 Units into the skin at bedtime.     levothyroxine 75 MCG tablet  Commonly known as:  SYNTHROID, LEVOTHROID  Take 75 mcg by mouth daily.     metoprolol tartrate 25 MG tablet  Commonly known as:  LOPRESSOR  Take 25 mg by mouth 2 (two) times daily.     predniSONE 5 MG tablet  Commonly known as:  DELTASONE  Take 5 mg by mouth daily.     spironolactone 25 MG tablet  Commonly known as:  ALDACTONE  Take 25 mg by mouth daily.     vitamin B-12 1000 MCG tablet  Commonly known as:  CYANOCOBALAMIN  Take 1,000 mcg by mouth daily.        Review of Systems  Constitutional: Negative for fever and chills.  HENT: Positive for hearing  loss. Negative for congestion, ear discharge and ear pain.   Eyes: Negative for pain, discharge and redness.  Respiratory: Positive for cough. Negative for shortness of breath and wheezing.   Cardiovascular: Positive for leg swelling. Negative for chest pain and palpitations.       Edema 1+ BLE  Gastrointestinal: Negative for nausea, vomiting, abdominal pain and constipation.  Genitourinary: Positive for frequency. Negative for dysuria and urgency.  Musculoskeletal: Negative for myalgias, back pain  and neck pain.       Left forearm pain, X-ray 09/04/14 negative study.   Skin: Negative for rash.       A small open wound at the right buttock, healed  Neurological: Negative for dizziness, tremors, seizures, weakness and headaches.  Hematological: Does not bruise/bleed easily.  Psychiatric/Behavioral: Negative for suicidal ideas and hallucinations. The patient is nervous/anxious.     Immunization History  Administered Date(s) Administered  . Influenza Whole 07/30/2006  . Influenza-Unspecified 06/02/2014, 04/20/2015  . PPD Test 10/06/2011  . Td 07/30/2006   Pertinent  Health Maintenance Due  Topic Date Due  . FOOT EXAM  07/26/1932  . OPHTHALMOLOGY EXAM  07/26/1932  . URINE MICROALBUMIN  07/26/1932  . PNA vac Low Risk Adult (1 of 2 - PCV13) 07/27/1987  . HEMOGLOBIN A1C  11/22/2015  . INFLUENZA VACCINE  02/28/2016   Fall Risk  06/16/2014 04/07/2014 01/13/2014  Falls in the past year? Yes - No  Number falls in past yr: 2 or more - -  Risk Factor Category  High Fall Risk - -  Risk for fall due to : History of fall(s);Impaired balance/gait;Impaired mobility Impaired mobility Impaired mobility  Risk for fall due to (comments): - fall band on -   Functional Status Survey:    Filed Vitals:   01/02/16 1457  BP: 128/64  Pulse: 68  Temp: 97.3 F (36.3 C)  TempSrc: Oral  Resp: 20  Height: 5' 8.5" (1.74 m)  SpO2: 96%   There is no weight on file to calculate BMI. Physical Exam    Constitutional: He is oriented to person, place, and time. He appears well-developed and well-nourished. No distress.  HENT:  Head: Normocephalic and atraumatic.  Right Ear: External ear normal.  Left Ear: External ear normal.  Nose: Nose normal.  Mouth/Throat: Oropharynx is clear and moist. No oropharyngeal exudate.  Eyes: Conjunctivae and EOM are normal. Pupils are equal, round, and reactive to light.  Neck: Normal range of motion. Neck supple. No JVD present. No thyromegaly present.  Cardiovascular: Normal rate, regular rhythm and normal heart sounds.   No murmur heard. Pulmonary/Chest: Effort normal. No respiratory distress. He has no wheezes. He has rales in the right lower field and the left lower field. He exhibits no tenderness.  Abdominal: Soft. Bowel sounds are normal. He exhibits no distension and no mass. There is no tenderness. There is no rebound.  Musculoskeletal: Normal range of motion. He exhibits edema (trace in BLE). He exhibits no tenderness.  1+ edema BLE  Lymphadenopathy:    He has no cervical adenopathy.  Neurological: He is alert and oriented to person, place, and time. He has normal reflexes. No cranial nerve deficit. Coordination normal.  Skin: Skin is warm and dry. No rash noted. He is not diaphoretic. No erythema.  A small open wound at the right buttock, healed.   Psychiatric: Thought content normal. His mood appears anxious. His affect is angry and inappropriate. His affect is not blunt and not labile. His speech is not delayed, not tangential and not slurred. He is agitated and aggressive. He is not hyperactive, not slowed, not withdrawn, not actively hallucinating and not combative. Cognition and memory are impaired. He exhibits a depressed mood. He is communicative. He exhibits abnormal recent memory. He is attentive.    Labs reviewed:  Recent Labs  05/24/15 10/26/15  NA 134* 134*  K 4.6 4.4  BUN 47* 54*  CREATININE 1.5* 1.4*    Recent Labs   05/24/15  10/26/15  AST 13* 15  ALT 16 26  ALKPHOS 79 89    Recent Labs  11/03/15 0804 12/01/15 0840 12/29/15 0800  WBC 6.1 7.0 5.7  NEUTROABS 3.9 4.6 3.9  HGB 6.5* 6.6* 5.8*  HCT 19.6* 20.4* 18.0*  MCV 90.3 90.3 90.9  PLT 88* 81* 89 Large & giant platelets*   Lab Results  Component Value Date   TSH 1.74 10/26/2015   Lab Results  Component Value Date   HGBA1C 8.2* 05/24/2015   No results found for: CHOL, HDL, LDLCALC, LDLDIRECT, TRIG, CHOLHDL  Significant Diagnostic Results in last 30 days:  No results found.  Assessment/Plan  Adult failure to thrive Hospice referral, comfort measures.   CHF (congestive heart failure) (HCC) Minimal BLE edema has no change, continue Furosemide 40mg , Spironolactone 25mg , 10/26/15 Na 134, K 4.4, Bun 54, creat 1.4  Essential hypertension, benign Controlled, continue Metoprolol 25mg  bid. off Losartan due to CKD. Creatinine 1.5-2.0  Type 2 diabetes mellitus with diabetic chronic kidney disease ([place]) 0/25/16 Hgb A1c 8.2, 10/26/15 Hgb a1c 8.6 Continue Lantus 54 qd Novolog 12u for CBG >450 instead of 5u with meals.  Hypothyroidism Controlled, last TSH 3.433 05/24/15, 10/25/15 TSH 1.74, continue Levothyroxine 30mcg daily  Dementia Gradual decline, off Namenda R>B in setting of bone marrow dysplasia . Continue SNF for care needs  CKD (chronic kidney disease) 10/26/15 Na 134, K 4.4, Bun 54, creat 1.4 Furosemide and Spironolactone contributory  Anemia in chronic kidney disease Hgb 7s, hx of frequent blood transfusion due to MDS, desires hospice for future care.   MDS (myelodysplastic syndrome) ([place]) 10/26/15, off Aranesp, prn blood transfusion. Desires Hospice for future care  Depression Mood is stable, continue Cymbalta 60mg  daily    Family/ staff Communication: continue SNF for care needs.   Labs/tests ordered: none

## 2016-01-02 NOTE — Assessment & Plan Note (Signed)
Controlled, last TSH 3.433 05/24/15, 10/25/15 TSH 1.74, continue Levothyroxine 67mcg daily

## 2016-01-02 NOTE — Assessment & Plan Note (Signed)
10/26/15 Na 134, K 4.4, Bun 54, creat 1.4 Furosemide and Spironolactone contributory

## 2016-01-02 NOTE — Assessment & Plan Note (Signed)
Hospice referral, comfort measures.

## 2016-01-09 ENCOUNTER — Telehealth: Payer: Self-pay | Admitting: *Deleted

## 2016-01-09 NOTE — Telephone Encounter (Signed)
Wynell Balloon, NP w/ [facility] called to ask if ok with Dr. Alvy Bimler for Hospice referral?  Wife would like to have comfort measures only for pt and stop transfusions.  Informed [name] ok to proceed with Hospice referral per Dr. Alvy Bimler.   [name] will speak w/ care team and update our office tomorrow with status of referral.

## 2016-01-10 ENCOUNTER — Telehealth: Payer: Self-pay | Admitting: *Deleted

## 2016-01-10 NOTE — Telephone Encounter (Signed)
OK for hospice If family agree, we can DC all appt here

## 2016-01-10 NOTE — Telephone Encounter (Signed)
[name] Balloon, NP, states family in agreement w/ Hospice and stopping transfusions.  [name] wanted to make sure Dr. Alvy Bimler in agreement.  Informed [name] Dr. Alvy Bimler in agreement.  Appointments here canceled and I called referral to [facility] in the Referral department.

## 2016-01-16 ENCOUNTER — Non-Acute Institutional Stay (SKILLED_NURSING_FACILITY): Payer: Medicare Other | Admitting: Nurse Practitioner

## 2016-01-16 ENCOUNTER — Encounter: Payer: Self-pay | Admitting: Nurse Practitioner

## 2016-01-16 DIAGNOSIS — F0390 Unspecified dementia without behavioral disturbance: Secondary | ICD-10-CM

## 2016-01-16 DIAGNOSIS — K269 Duodenal ulcer, unspecified as acute or chronic, without hemorrhage or perforation: Secondary | ICD-10-CM | POA: Diagnosis not present

## 2016-01-16 DIAGNOSIS — I5020 Unspecified systolic (congestive) heart failure: Secondary | ICD-10-CM | POA: Diagnosis not present

## 2016-01-16 DIAGNOSIS — F329 Major depressive disorder, single episode, unspecified: Secondary | ICD-10-CM | POA: Diagnosis not present

## 2016-01-16 DIAGNOSIS — F32A Depression, unspecified: Secondary | ICD-10-CM

## 2016-01-16 DIAGNOSIS — E039 Hypothyroidism, unspecified: Secondary | ICD-10-CM | POA: Diagnosis not present

## 2016-01-16 DIAGNOSIS — D469 Myelodysplastic syndrome, unspecified: Secondary | ICD-10-CM

## 2016-01-16 DIAGNOSIS — I10 Essential (primary) hypertension: Secondary | ICD-10-CM | POA: Diagnosis not present

## 2016-01-16 DIAGNOSIS — N182 Chronic kidney disease, stage 2 (mild): Secondary | ICD-10-CM

## 2016-01-16 DIAGNOSIS — E1122 Type 2 diabetes mellitus with diabetic chronic kidney disease: Secondary | ICD-10-CM

## 2016-01-16 NOTE — Assessment & Plan Note (Signed)
Minimal BLE edema has no change, continue Furosemide 40mg , Spironolactone 25mg , 10/26/15 Na 134, K 4.4, Bun 54, creat 1.4

## 2016-01-16 NOTE — Assessment & Plan Note (Signed)
Stable, continue Famotidine 20mg  daily, no c/o stomach pain, indigestion, or melena

## 2016-01-16 NOTE — Assessment & Plan Note (Signed)
Controlled, continue Metoprolol 25mg  bid. off Losartan due to CKD. Creatinine 1.5-2.0

## 2016-01-16 NOTE — Assessment & Plan Note (Signed)
Controlled, last TSH 3.433 05/24/15, 10/25/15 TSH 1.74, continue Levothyroxine 11mcg daily

## 2016-01-16 NOTE — Assessment & Plan Note (Signed)
No longer pursuing blood transfusion. Hospice Service for comfort measures.

## 2016-01-16 NOTE — Assessment & Plan Note (Signed)
Gradual decline, off Namenda R>B in setting of bone marrow dysplasia . Continue SNF for care needs

## 2016-01-16 NOTE — Assessment & Plan Note (Signed)
10/26/15 Hgb a1c 8.6, noted CBG in 60s 01/12/16, 01/13/16 decrease Lantus 48 qd, continue Novolog 12u for CBG >450 instead of 5u with meals.

## 2016-01-16 NOTE — Assessment & Plan Note (Signed)
10/26/15 Na 134, K 4.4, Bun 54, creat 1.4 Furosemide and Spironolactone contributory

## 2016-01-16 NOTE — Progress Notes (Signed)
Patient ID: [name] [name], male   DOB: [birth year], 82 y.o.   MRN: [id number]  Location:   [facility] Room Number: 13 Place of Service:   [facility] Provider: [facility] Karmina Zufall NP  Jeanmarie Hubert, MD  Patient Care Team: Estill Dooms, MD as PCP - General (Internal Medicine) Merrel Crabbe Otho Darner, NP as Nurse Practitioner (Nurse Practitioner)  Extended Emergency Contact Information Primary Emergency Contact: Wisecup,Dolores Address: [address]          [place], [place] [address] [place] Phone: [phone] Mobile Phone: [phone] Relation: Spouse  Code Status:  DNR Goals of care: Advanced Directive information Advanced Directives 01/16/2016  Does patient have an advance directive? Yes  Type of Paramedic of [place];Out of facility DNR (pink MOST or yellow form)  Does patient want to make changes to advanced directive? No - Patient declined  Copy of advanced directive(s) in chart? Yes     Chief Complaint  Patient presents with  . Acute Visit    fell on 01/10/2016    HPI:  Pt is a 82 y.o. male seen today for medical management of chronic diseases.  Hx of dementia, PUD, anemia, T2DM with blood sugar controlled on Lantus , hypertension on Metoprolol, chronic edema is managed with Furosemide 40mg  and Spironolactone 25mg  depression on Cymbalta 60mg , and hypothyroidism on Levothyroxine 23mcg.   Past Medical History  Diagnosis Date  . Hypothyroidism   . Hypertension   . CHF (congestive heart failure) ([place])   . Diabetes mellitus   . Coronary artery disease   . Restless leg syndrome   . Hyperlipemia   . Renal disease     stage II  . Sleep apnea   . Cellulitis and abscess of leg   . Obesity   . Duodenal ulcer, perforated ([place])   . Diverticulosis   . Internal hemorrhoids   . CAD (coronary artery disease)   . Anemia   . Duodenal ulcer   . Allergy   . Blood transfusion   . Cataract   . Tuberculosis     as a child  .  Contact dermatitis and other eczema, due to unspecified cause 10/06/2012  . Senile dementia with depressive features 05/26/2012  . Hyposmolality and/or hyponatremia 04/02/2012  . Diarrhea 03/26/2012  . Hemorrhage of rectum and anus 03/25/2012  . Pneumonia, organism unspecified 03/12/2012  . 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 Folate-deficiency anemia 01/07/2012  . Thrombocytopenia, unspecified ([place]) 09/26/2011  . Loss of weight 09/11/2011  . Dysphagia, unspecified(787.20) 06/28/2011  . Chronic kidney disease, stage III (moderate) 03/22/2011  . Cramp of limb 08/10/2010  . Chronic kidney disease, stage II (mild) 03/22/2011  . Personal history of fall 05/05/2009  . Unspecified vitamin D deficiency 02/24/2009  . Blood in stool 11/18/2008  . Dermatophytosis of the body 06/23/2007  . Obesity, unspecified 06/23/2007  . Unspecified tinnitus 06/23/2007  . Edema 06/23/2007  . Shortness of breath 06/23/2007  . Flatulence, eructation, and gas pain 06/23/2007  . Other general symptoms(780.99) 06/23/2007  . Memory loss 08/13/2003  . Screening PSA (prostate specific antigen) 07/27/2014  . Prostate cancer screening 07/27/2014  . Weight loss 07/27/2014   Past Surgical History  Procedure Laterality Date  . Laparotomy  09/12/2011    Procedure: EXPLORATORY LAPAROTOMY;  Surgeon: Judieth Keens, DO;  [facility]: [facility];  Service: General;  Laterality: N/A;  REPAIR PERFORATED ULCER  . Gastrostomy  09/12/2011  Procedure: GASTROSTOMY;  Surgeon: Judieth Keens, DO;  Location: [facility];  Service: General;  Laterality: N/A;  BIOPSY DUODENAL ULCER  . Shoulder surgery  [place], [place]  . Jejunostomy feeding tube      removed  . Back surgery  1942  . Eye surgery  [phone]    cataracts  . Coronary artery bypass graft  2201    Allergies  Allergen Reactions  . Enalapril Other (See Comments)    cough  . Metformin And Related Diarrhea  . Milk-Related Compounds  Other (See Comments)    Doesn't remember but its a mild reaction      Medication List       This list is accurate as of: 01/16/16 11:59 PM.  Always use your most recent med list.               acetaminophen 325 MG tablet  Commonly known as:  TYLENOL  Take 650 mg by mouth every 4 (four) hours as needed (for pain).     BENEPROTEIN PO  Take by mouth. Take 2 scoops by mouth with beverage of choice twice daily     DULoxetine 60 MG capsule  Commonly known as:  CYMBALTA  Take 60 mg by mouth daily.     famotidine 20 MG tablet  Commonly known as:  PEPCID  Take 1 tablet by mouth at bedtime     folic acid 1 MG tablet  Commonly known as:  FOLVITE  Take 1 mg by mouth daily.     furosemide 40 MG tablet  Commonly known as:  LASIX  Take 40 mg by mouth daily.     guaiFENesin 100 MG/5ML liquid  Commonly known as:  ROBITUSSIN  Take 200 mg by mouth. Give 41ml every 6 hours as needed for cough     insulin aspart 100 UNIT/ML injection  Commonly known as:  novoLOG  CBG before meals and give 5 units For CBG>150 **If CBG >450 give 12 units instead of 5 units     insulin glargine 100 UNIT/ML injection  Commonly known as:  LANTUS  Inject 54 Units into the skin at bedtime.     levothyroxine 75 MCG tablet  Commonly known as:  SYNTHROID, LEVOTHROID  Take 75 mcg by mouth daily.     metoprolol tartrate 25 MG tablet  Commonly known as:  LOPRESSOR  Take 25 mg by mouth 2 (two) times daily.     predniSONE 5 MG tablet  Commonly known as:  DELTASONE  Take 5 mg by mouth daily.     spironolactone 25 MG tablet  Commonly known as:  ALDACTONE  Take 25 mg by mouth daily.     vitamin B-12 1000 MCG tablet  Commonly known as:  CYANOCOBALAMIN  Take 1,000 mcg by mouth daily.        Review of Systems  Constitutional: Negative for fever and chills.  HENT: Positive for hearing loss. Negative for congestion, ear discharge and ear pain.   Eyes: Negative for pain, discharge and redness.    Respiratory: Positive for cough. Negative for shortness of breath and wheezing.   Cardiovascular: Positive for leg swelling. Negative for chest pain and palpitations.       Edema 1+ BLE  Gastrointestinal: Negative for nausea, vomiting, abdominal pain and constipation.  Genitourinary: Positive for frequency. Negative for dysuria and urgency.  Musculoskeletal: Negative for myalgias, back pain and neck pain.       Left forearm pain, X-ray 09/04/14  negative study.   Skin: Negative for rash.       A small open wound at the right buttock, healed  Neurological: Negative for dizziness, tremors, seizures, weakness and headaches.  Hematological: Does not bruise/bleed easily.  Psychiatric/Behavioral: Negative for suicidal ideas and hallucinations. The patient is nervous/anxious.     Immunization History  Administered Date(s) Administered  . Influenza Whole 07/30/2006  . Influenza-Unspecified 06/02/2014, 04/20/2015  . PPD Test 10/06/2011  . Td 07/30/2006   There are no preventive care reminders to display for this patient. Fall Risk  06/16/2014 04/07/2014 01/13/2014  Falls in the past year? Yes - No  Number falls in past yr: 2 or more - -  Risk Factor Category  High Fall Risk - -  Risk for fall due to : History of fall(s);Impaired balance/gait;Impaired mobility Impaired mobility Impaired mobility  Risk for fall due to (comments): - fall band on -   Functional Status Survey:    Filed Vitals:   01/16/16 1311  BP: 112/60  Pulse: 76  Temp: 98.7 F (37.1 C)  TempSrc: Tympanic  Resp: 20  Height: 5\' 10"  (1.778 m)  Weight: 218 lb 4.8 oz (99.02 kg)   Body mass index is 31.32 kg/(m^2). Physical Exam  Constitutional: He is oriented to person, place, and time. He appears well-developed and well-nourished. No distress.  HENT:  Head: Normocephalic and atraumatic.  Right Ear: External ear normal.  Left Ear: External ear normal.  Nose: Nose normal.  Mouth/Throat: Oropharynx is clear and moist. No  oropharyngeal exudate.  Eyes: Conjunctivae and EOM are normal. Pupils are equal, round, and reactive to light.  Neck: Normal range of motion. Neck supple. No JVD present. No thyromegaly present.  Cardiovascular: Normal rate, regular rhythm and normal heart sounds.   No murmur heard. Pulmonary/Chest: Effort normal. No respiratory distress. He has no wheezes. He has rales in the right lower field and the left lower field. He exhibits no tenderness.  Abdominal: Soft. Bowel sounds are normal. He exhibits no distension and no mass. There is no tenderness. There is no rebound.  Musculoskeletal: Normal range of motion. He exhibits edema (trace in BLE). He exhibits no tenderness.  1+ edema BLE  Lymphadenopathy:    He has no cervical adenopathy.  Neurological: He is alert and oriented to person, place, and time. He has normal reflexes. No cranial nerve deficit. Coordination normal.  Skin: Skin is warm and dry. No rash noted. He is not diaphoretic. No erythema.  A small open wound at the right buttock, healed.   Psychiatric: Thought content normal. His mood appears anxious. His affect is angry and inappropriate. His affect is not blunt and not labile. His speech is not delayed, not tangential and not slurred. He is agitated and aggressive. He is not hyperactive, not slowed, not withdrawn, not actively hallucinating and not combative. Cognition and memory are impaired. He exhibits a depressed mood. He is communicative. He exhibits abnormal recent memory. He is attentive.    Labs reviewed:  Recent Labs  05/24/15 10/26/15  NA 134* 134*  K 4.6 4.4  BUN 47* 54*  CREATININE 1.5* 1.4*    Recent Labs  05/24/15 10/26/15  AST 13* 15  ALT 16 26  ALKPHOS 79 89    Recent Labs  11/03/15 0804 12/01/15 0840 12/29/15 0800  WBC 6.1 7.0 5.7  NEUTROABS 3.9 4.6 3.9  HGB 6.5* 6.6* 5.8*  HCT 19.6* 20.4* 18.0*  MCV 90.3 90.3 90.9  PLT 88* 81* 89 Large &  giant platelets*   Lab Results  Component Value  Date   TSH 1.74 10/26/2015   Lab Results  Component Value Date   HGBA1C 8.2* 05/24/2015   No results found for: CHOL, HDL, LDLCALC, LDLDIRECT, TRIG, CHOLHDL  Significant Diagnostic Results in last 30 days:  No results found.  Assessment/Plan  CHF (congestive heart failure) (HCC) Minimal BLE edema has no change, continue Furosemide 40mg , Spironolactone 25mg , 10/26/15 Na 134, K 4.4, Bun 54, creat 1.4   Essential hypertension, benign Controlled, continue Metoprolol 25mg  bid. off Losartan due to CKD. Creatinine 1.5-2.0  Duodenal ulcer Stable, continue Famotidine 20mg  daily, no c/o stomach pain, indigestion, or melena  Type 2 diabetes mellitus with diabetic chronic kidney disease ([place]) 10/26/15 Hgb a1c 8.6, noted CBG in 60s 01/12/16, 01/13/16 decrease Lantus 48 qd, continue Novolog 12u for CBG >450 instead of 5u with meals.   Hypothyroidism Controlled, last TSH 3.433 05/24/15, 10/25/15 TSH 1.74, continue Levothyroxine 46mcg daily   Dementia Gradual decline, off Namenda R>B in setting of bone marrow dysplasia . Continue SNF for care needs  CKD (chronic kidney disease) 10/26/15 Na 134, K 4.4, Bun 54, creat 1.4 Furosemide and Spironolactone contributory   MDS (myelodysplastic syndrome) (HCC) No longer pursuing blood transfusion. Hospice Service for comfort measures.   Depression Mood is stable, continue Cymbalta 60mg  daily     Family/ staff Communication: continue SNF, Hospice service  for care needs.   Labs/tests ordered: none

## 2016-01-16 NOTE — Assessment & Plan Note (Signed)
Mood is stable, continue Cymbalta 60mg  daily

## 2016-01-18 ENCOUNTER — Telehealth: Payer: Self-pay | Admitting: *Deleted

## 2016-01-18 NOTE — Telephone Encounter (Signed)
Received FMLA paperwork on fax machine for pt's daughter, Rephael Brincefield.  Forwarded to managed care dept.Hilda Blades called nurse and asks for forms to be faxed back to her and to HR dept when complete.  She also requests call on her cell to inform her when they are complete.  Message given to Managed Care Dept.

## 2016-01-19 ENCOUNTER — Telehealth: Payer: Self-pay | Admitting: *Deleted

## 2016-01-19 NOTE — Telephone Encounter (Signed)
Daughter called to check on FMLA forms.  They have not been completed.  I completed forms, Dr. Alvy Bimler signed them and I faxed them back to daughter and HR dept as requested.  LVM for daughter that they are completed and please call nurse back if any problems.

## 2016-01-20 ENCOUNTER — Telehealth: Payer: Self-pay | Admitting: *Deleted

## 2016-01-20 NOTE — Telephone Encounter (Signed)
S/w Hilda Blades,  Clarified some items on FMLA and re faxed them to her and HR dept.Marland Kitchen

## 2016-01-20 NOTE — Telephone Encounter (Signed)
[name] [name] left VM states FMLA form needs some more information per her HR person.  Called Debra back and left her a VM to return my call or fax the request to my desk.

## 2016-01-26 ENCOUNTER — Other Ambulatory Visit: Payer: Medicare Other

## 2016-02-23 ENCOUNTER — Other Ambulatory Visit: Payer: Medicare Other

## 2016-02-28 DEATH — deceased

## 2016-03-22 ENCOUNTER — Other Ambulatory Visit: Payer: Medicare Other

## 2016-03-22 ENCOUNTER — Ambulatory Visit: Payer: Medicare Other | Admitting: Hematology and Oncology
